# Patient Record
Sex: Male | Born: 1945
Health system: Southern US, Community
[De-identification: ages and names within clinical notes are randomized; demographics above are authoritative.]

## PROBLEM LIST (undated history)

## (undated) DIAGNOSIS — N189 Chronic kidney disease, unspecified: Secondary | ICD-10-CM

## (undated) DIAGNOSIS — Z8659 Personal history of other mental and behavioral disorders: Secondary | ICD-10-CM

## (undated) DIAGNOSIS — R809 Proteinuria, unspecified: Secondary | ICD-10-CM

## (undated) DIAGNOSIS — Z86010 Personal history of colonic polyps: Secondary | ICD-10-CM

## (undated) DIAGNOSIS — N529 Male erectile dysfunction, unspecified: Secondary | ICD-10-CM

## (undated) DIAGNOSIS — I639 Cerebral infarction, unspecified: Secondary | ICD-10-CM

## (undated) DIAGNOSIS — E118 Type 2 diabetes mellitus with unspecified complications: Secondary | ICD-10-CM

## (undated) DIAGNOSIS — E119 Type 2 diabetes mellitus without complications: Secondary | ICD-10-CM

## (undated) DIAGNOSIS — K922 Gastrointestinal hemorrhage, unspecified: Secondary | ICD-10-CM

## (undated) DIAGNOSIS — R42 Dizziness and giddiness: Secondary | ICD-10-CM

## (undated) DIAGNOSIS — I10 Essential (primary) hypertension: Secondary | ICD-10-CM

## (undated) DIAGNOSIS — E669 Obesity, unspecified: Secondary | ICD-10-CM

## (undated) DIAGNOSIS — G4733 Obstructive sleep apnea (adult) (pediatric): Secondary | ICD-10-CM

## (undated) DIAGNOSIS — M79661 Pain in right lower leg: Secondary | ICD-10-CM

## (undated) DIAGNOSIS — K5790 Diverticulosis of intestine, part unspecified, without perforation or abscess without bleeding: Secondary | ICD-10-CM

## (undated) DIAGNOSIS — M109 Gout, unspecified: Secondary | ICD-10-CM

## (undated) DIAGNOSIS — E7800 Pure hypercholesterolemia, unspecified: Secondary | ICD-10-CM

## (undated) DIAGNOSIS — M1990 Unspecified osteoarthritis, unspecified site: Secondary | ICD-10-CM

## (undated) DIAGNOSIS — Z794 Long term (current) use of insulin: Secondary | ICD-10-CM

## (undated) DIAGNOSIS — I739 Peripheral vascular disease, unspecified: Secondary | ICD-10-CM

## (undated) HISTORY — DX: Pain in right lower leg: M79.661

## (undated) HISTORY — DX: Diverticulosis of intestine, part unspecified, without perforation or abscess without bleeding: K57.90

## (undated) HISTORY — DX: Long term (current) use of insulin: Z79.4

## (undated) HISTORY — DX: Unspecified osteoarthritis, unspecified site: M19.90

## (undated) HISTORY — DX: Chronic kidney disease, unspecified: N18.9

## (undated) HISTORY — DX: Obstructive sleep apnea (adult) (pediatric): G47.33

## (undated) HISTORY — DX: Gout, unspecified: M10.9

## (undated) HISTORY — PX: UMBILICAL HERNIA REPAIR: SHX196

## (undated) HISTORY — DX: Morbid (severe) obesity due to excess calories: E66.01

## (undated) HISTORY — DX: Proteinuria, unspecified: R80.9

## (undated) HISTORY — DX: Gastrointestinal hemorrhage, unspecified: K92.2

## (undated) HISTORY — DX: Type 2 diabetes mellitus with unspecified complications: E11.8

## (undated) HISTORY — DX: Peripheral vascular disease, unspecified: I73.9

## (undated) HISTORY — DX: Dizziness and giddiness: R42

## (undated) HISTORY — DX: Obesity, unspecified: E66.9

## (undated) HISTORY — DX: Cerebral infarction, unspecified: I63.9

## (undated) HISTORY — DX: Male erectile dysfunction, unspecified: N52.9

## (undated) HISTORY — DX: Personal history of colonic polyps: Z86.010

## (undated) SURGERY — Surgical Case
Anesthesia: *Unknown

---

## 2004-07-17 ENCOUNTER — Ambulatory Visit (HOSPITAL_COMMUNITY): Admission: RE | Admit: 2004-07-17 | Discharge: 2004-07-17 | Payer: Self-pay | Admitting: *Deleted

## 2004-08-25 ENCOUNTER — Inpatient Hospital Stay (HOSPITAL_COMMUNITY): Admission: EM | Admit: 2004-08-25 | Discharge: 2004-08-27 | Payer: Self-pay | Admitting: Podiatry

## 2004-12-18 ENCOUNTER — Ambulatory Visit: Payer: Self-pay | Admitting: Internal Medicine

## 2004-12-21 ENCOUNTER — Ambulatory Visit: Payer: Self-pay | Admitting: Internal Medicine

## 2004-12-21 ENCOUNTER — Ambulatory Visit (HOSPITAL_COMMUNITY): Admission: RE | Admit: 2004-12-21 | Discharge: 2004-12-21 | Payer: Self-pay | Admitting: Internal Medicine

## 2005-01-04 ENCOUNTER — Ambulatory Visit (HOSPITAL_BASED_OUTPATIENT_CLINIC_OR_DEPARTMENT_OTHER): Admission: RE | Admit: 2005-01-04 | Discharge: 2005-01-04 | Payer: Self-pay | Admitting: General Surgery

## 2005-01-04 ENCOUNTER — Ambulatory Visit (HOSPITAL_COMMUNITY): Admission: RE | Admit: 2005-01-04 | Discharge: 2005-01-04 | Payer: Self-pay | Admitting: General Surgery

## 2005-01-04 ENCOUNTER — Encounter (INDEPENDENT_AMBULATORY_CARE_PROVIDER_SITE_OTHER): Payer: Self-pay | Admitting: *Deleted

## 2009-04-03 ENCOUNTER — Encounter: Payer: Self-pay | Admitting: Internal Medicine

## 2009-05-25 DIAGNOSIS — E7800 Pure hypercholesterolemia, unspecified: Secondary | ICD-10-CM | POA: Insufficient documentation

## 2009-05-25 DIAGNOSIS — N529 Male erectile dysfunction, unspecified: Secondary | ICD-10-CM | POA: Insufficient documentation

## 2009-05-25 DIAGNOSIS — E1121 Type 2 diabetes mellitus with diabetic nephropathy: Secondary | ICD-10-CM | POA: Insufficient documentation

## 2009-08-01 ENCOUNTER — Encounter: Payer: Self-pay | Admitting: Internal Medicine

## 2009-11-13 ENCOUNTER — Encounter (INDEPENDENT_AMBULATORY_CARE_PROVIDER_SITE_OTHER): Payer: Self-pay | Admitting: *Deleted

## 2009-11-17 ENCOUNTER — Encounter (INDEPENDENT_AMBULATORY_CARE_PROVIDER_SITE_OTHER): Payer: Self-pay | Admitting: *Deleted

## 2009-11-24 ENCOUNTER — Encounter: Payer: Self-pay | Admitting: Internal Medicine

## 2009-12-01 ENCOUNTER — Encounter: Payer: Self-pay | Admitting: Internal Medicine

## 2009-12-22 ENCOUNTER — Ambulatory Visit: Payer: Self-pay | Admitting: Internal Medicine

## 2009-12-22 DIAGNOSIS — K5730 Diverticulosis of large intestine without perforation or abscess without bleeding: Secondary | ICD-10-CM | POA: Insufficient documentation

## 2009-12-22 DIAGNOSIS — K5669 Other intestinal obstruction: Secondary | ICD-10-CM

## 2010-01-20 ENCOUNTER — Encounter: Admission: RE | Admit: 2010-01-20 | Discharge: 2010-01-20 | Payer: Self-pay | Admitting: Internal Medicine

## 2010-02-03 ENCOUNTER — Encounter (INDEPENDENT_AMBULATORY_CARE_PROVIDER_SITE_OTHER): Payer: Self-pay | Admitting: *Deleted

## 2010-02-04 ENCOUNTER — Encounter (INDEPENDENT_AMBULATORY_CARE_PROVIDER_SITE_OTHER): Payer: Self-pay | Admitting: *Deleted

## 2010-02-04 ENCOUNTER — Ambulatory Visit: Payer: Self-pay | Admitting: Internal Medicine

## 2010-02-16 ENCOUNTER — Ambulatory Visit: Payer: Self-pay | Admitting: Internal Medicine

## 2010-06-03 DIAGNOSIS — E1165 Type 2 diabetes mellitus with hyperglycemia: Secondary | ICD-10-CM | POA: Insufficient documentation

## 2010-12-03 NOTE — Letter (Signed)
Summary: [facility]  [address] [place], [place] [address]   Phone: [phone]  Fax: [phone]       Jacob Avery    1946/08/23    MRN: [id number]        Procedure Day /Date:  Monday  02/16/10     Arrival Time:  10:30am      Procedure Time:  11:30am     Location of Procedure:                    Juliann Pares _  [facility] (4th Floor)    PREPARATION FOR FLEXIBLE SIGMOIDOSCOPY WITH FLEET ENEMA  Prior to the day before your procedure, purchase  one Fleet Enema from the laxative section of your drugstore.  _________________________________________________________________________________________________  THE DAY BEFORE YOUR PROCEDURE             DATE: 04/17    DAY:  Sunday  1.   Have a clear liquid dinner the night before your procedure.  2.   Do not drink anything colored red or purple.  Avoid juices with pulp.  No orange juice.              CLEAR LIQUIDS INCLUDE: Water Jello Ice Popsicles Tea (sugar ok, no milk/cream) Powdered fruit flavored drinks Coffee (sugar ok, no milk/cream) Gatorade Juice: apple, white grape, white cranberry  Lemonade Clear bullion, consomm, broth Carbonated beverages (any kind) Strained chicken noodle soup Hard Candy    ___________________________________________________________________________________________________  THE DAY OF YOUR PROCEDURE            DATE: 04/18 DAY:  Monday  1.   Use Fleet Enema one hour prior to coming for procedure.  2.   You may drink clear liquids until  9:30am  (2 hours before exam)       MEDICATION INSTRUCTIONS  Unless otherwise instructed, you should take regular prescription medications with a small sip of water as early as possible the morning of your procedure.  Diabetic patients - see separate instructions.    Additional medication instructions: Hold Benica/HCT the morning of procedure.         OTHER INSTRUCTIONS  You will need a responsible  adult at least 66 years of age to accompany you and drive you home.   This person must remain in the waiting room during your procedure.  Wear loose fitting clothing that is easily removed.  Leave jewelry and other valuables at home.  However, you may wish to bring a book to read or an iPod/MP3 player to listen to music as you wait for your procedure to start.  Remove all body piercing jewelry and leave at home.  Total time from sign-in until discharge is approximately 2-3 hours.  You should go home directly after your procedure and rest.  You can resume normal activities the day after your procedure.  The day of your procedure you should not:   Drive   Make legal decisions   Operate machinery   Drink alcohol   Return to work  You will receive specific instructions about eating, activities and medications before you leave.   The above instructions have been reviewed and explained to me by  Wyona Almas RN  February 04, 2010 9:20 AM     I fully understand and can verbalize these instructions _____________________________ Date _________

## 2010-12-03 NOTE — Procedures (Signed)
Summary: Colonoscopy: Diverticulosis   Colonoscopy  Procedure date:  12/21/2004  Findings:      Results: Diverticulosis.       Location:  [facility].    Procedures Next Due Date:    Colonoscopy: 12/2009  Patient Name: Jacob Avery, Jacob Avery MRN:  Procedure Procedures: Incomplete Colonoscopy, unable to go beyond splenic flexure. CPT: M2924229.  Personnel: Endoscopist: Iva Boop, MD, [facility].  Referred By: Guerry Bruin, MD.  Exam Location: Exam performed in Outpatient Clinic. Outpatient  Patient Consent: Procedure, Alternatives, Risks and Benefits discussed, consent obtained, from patient. Consent was obtained by the RN.  Indications  Average Risk Screening Routine.  History  Current Medications: Patient is not currently taking Coumadin.  Pre-Exam Physical: Performed Dec 21, 2004. Cardio-pulmonary exam, Rectal exam, HEENT exam , Mental status exam WNL.  Exam Exam: Extent of exam reached: Sigmoid Colon, extent intended: Cecum.  The cecum was identified by appendiceal orifice and IC valve. Patient position: left side to back. Colon retroflexion performed. Images taken. ASA Classification: III. Tolerance: good.  Monitoring: Pulse and BP monitoring, Oximetry used. Supplemental O2 given.  Colon Prep Prep results: good.  Sedation Meds: Patient assessed and found to be appropriate for moderate (conscious) sedation. Fentanyl 75 mcg. given IV. Versed 7 mg. given IV.  Comments: Prone position also. Findings DIVERTICULOSIS: Sigmoid Colon. Not bleeding. ICD9: Diverticulosis, Colon: 562.10. Comments: severe, would not allow scope through.  NORMAL EXAM: Rectum.   Assessment Abnormal examination, see findings above.  Diagnoses: 562.10: Diverticulosis, Colon.   Comments: SEVERE SIGMOID DIVERTICULOSIS. UNABLE TO FIND LUMEN AND SAFELY ADVANCE SCOPE BEYOND. Events  Unplanned Interventions: No intervention was required.  Plans Patient Education: Patient  given standard instructions for: Diverticulosis. Yearly hemoccult testing recommended.  Disposition: After procedure patient sent to recovery. After recovery patient sent home.  Comments: BARIUM ENEMA TODAY  CC:   Richard [name]   The Patient    This report was created from the original endoscopy report, which was reviewed and signed by the above listed endoscopist.   [facility] Reports-CCC]

## 2010-12-03 NOTE — Procedures (Signed)
Summary: Flexible Sigmoidoscopy  Patient: [name] Note: All result statuses are Final unless otherwise noted.  Tests: (1) Flexible Sigmoidoscopy (FLX)  FLX Flexible Sigmoidoscopy                             DONE     [facility]     [address]     [place], [place]  [address]           FLEXIBLE SIGMOIDOSCOPY PROCEDURE REPORT           PATIENT:  Jacob Avery, Jacob Avery  MR#:  [id number]     BIRTHDATE:  1946/09/27, 63 yrs. old  GENDER:  male           ENDOSCOPIST:  Iva Boop, MD, [facility]           PROCEDURE DATE:  02/16/2010     PROCEDURE:  Flexible Sigmoidoscopy, diagnostic     ASA CLASS:  Class III     INDICATIONS:  abnormal imaging -  possible rectal polyp on CT     colonoscopy           MEDICATIONS:   none           DESCRIPTION OF PROCEDURE:   After the risks benefits and     alternatives of the procedure were thoroughly explained, informed     consent was obtained.  Digital rectal exam was performed and     revealed no rectal masses and an enlarged prostate.  Mildly     enlarged but smooth prostate. The [id number] endoscope     was introduced through the anus and advanced to the sigmoid colon,     without limitations.  The quality of the prep was adequate.  The     instrument was then slowly withdrawn as the mucosa was fully     examined.     <<PROCEDUREIMAGES>>           Severe diverticulosis was found in the sigmoid colon.  The rectum     and sigmoid appeared normal. in the rectum.   Retroflexed views in     the rectum revealed no abnormalities.    The scope was then     withdrawn from the patient and the procedure terminated.     COMPLICATIONS:  None           ENDOSCOPIC IMPRESSION:     1) Severe diverticulosis in the sigmoid colon     2) Normal rectum - no polyps seen           REPEAT EXAM:  In 5 year(s) for.  CT colonoscopy (office visit     recall)           Iva Boop, MD, [facility]           CC:  Guerry Bruin, MD     The Patient           n.     eSIGNED:   Iva Boop at 02/16/2010 12:44 PM           Donata Duff, [id number]  Note: An exclamation mark (!) indicates a result that was not dispersed into the flowsheet. Document Creation Date: 02/16/2010 12:45 PM _______________________________________________________________________  (1) Order result status: Final Collection or observation date-time: 02/16/2010 12:32 Requested date-time:  Receipt date-time:  Reported date-time:  Referring Physician:   Ordering Physician: Stan Head [phone]) Specimen Source:  Source: Launa Grill  Order Number: [id number] Lab site:

## 2010-12-03 NOTE — Letter (Signed)
Summary: New Patient letter  [facility]  [address] [place], [place] [address]   Phone: [phone]  Fax: [phone]       11/17/2009 MRN: [id number]  Jacob Avery [address] [place], [place]  [address]  Dear Mr. Deyarmin,  Welcome to the Gastroenterology Division at [facility].    You are scheduled to see Dr. Leone Payor on 12-22-09 at 9:00a.m. on the 3rd floor at [facility], [address]  We ask that you try to arrive at our office 15 minutes prior to your appointment time to allow for check-in.  We would like you to complete the enclosed self-administered evaluation form prior to your visit and bring it with you on the day of your appointment.  We will review it with you.  Also, please bring a complete list of all your medications or, if you prefer, bring the medication bottles and we will list them.  Please bring your insurance card so that we may make a copy of it.  If your insurance requires a referral to see a specialist, please bring your referral form from your primary care physician.  Co-payments are due at the time of your visit and may be paid by cash, check or credit card.     Your office visit will consist of a consult with your physician (includes a physical exam), any laboratory testing he/she may order, scheduling of any necessary diagnostic testing (e.g. x-ray, ultrasound, CT-scan), and scheduling of a procedure (e.g. Endoscopy, Colonoscopy) if required.  Please allow enough time on your schedule to allow for any/all of these possibilities.    If you cannot keep your appointment, please call [phone] to cancel or reschedule prior to your appointment date.  This allows [place] the opportunity to schedule an appointment for another patient in need of care.  If you do not cancel or reschedule by 5 p.m. the business day prior to your appointment date, you will be charged a $50.00 late cancellation/no-show fee.    Thank you for choosing  [facility] for your medical needs.  We appreciate the opportunity to care for you.  Please visit [place] at our website  to learn more about our practice.                     Sincerely,                                                             The Gastroenterology Division

## 2010-12-03 NOTE — Letter (Signed)
Summary: Office Visit Letter  [facility]  [address] [place], [place] [address]   Phone: [phone]  Fax: [phone]      November 13, 2009 MRN: [id number]   Jacob Avery [address] [place], [place]  [address]   Dear [name],   According to our records, it is time for you to schedule a follow-up office visit with [place].   At your convenience, please call [phone] (option #2)to schedule an office visit. If you have any questions, concerns, or feel that this letter is in error, we would appreciate your call.   Sincerely,  Iva Boop, M.D.  [facility]astro[facility] Division [phone]

## 2010-12-03 NOTE — Assessment & Plan Note (Signed)
Summary: FOLLOW UP--CH.   History of Present Illness Visit Type: Initial Visit Primary GI MD: Stan Head MD [facility] Primary Provider: Guerry Bruin, MD Chief Complaint: yearly follow-up denies any problems at this time History of Present Illness:   65 yo frican-american man with incomplete colonoscopy due to angulated and strictured sigmoid colon in setting of diverticulosis. Barium enema showed the severe diverticulosis but no polyps. He returns to discuss follow-up screening at 5 years from the original. He says hemocult of stool was and is negative (FIT used this year if not others). All other GI ROS negative.          Preventive Screening-Counseling & Management  Alcohol-Tobacco     Smoking Status: quit      Drug Use:  no.      Current Medications (verified): 1)  Aspirin 81 Mg Tbec (Aspirin) .Marland Kitchen.. 1 Tablet By Mouth Once Daily 2)  Lipitor 80 Mg Tabs (Atorvastatin Calcium) .Marland Kitchen.. 1 Tablet By Mouth Once Daily 3)  Benicar Hct 40-25 Mg Tabs (Olmesartan Medoxomil-Hctz) .Marland Kitchen.. 1 Tablet By Mouth Once Daily 4)  Actoplus Met 15-850 Mg Tabs (Pioglitazone Hcl-Metformin Hcl) .Marland Kitchen.. 1 Tablet By Mouth Once Daily 5)  Amlodipine Besylate 10 Mg Tabs (Amlodipine Besylate) .Marland Kitchen.. 1 Tablet By Mouth Once Daily 6)  Lantus For Opticlik 100 Unit/ml Soln (Insulin Glargine) .... 35 Units Per Day 7)  Levitra 10 Mg Tabs (Vardenafil Hcl) .... Take 1 Tablet By Mouth As Needed  Allergies (verified): No Known Drug Allergies  Past History:  Past Medical History: Reviewed history from 12/18/2009 and no changes required. Diabetes Diverticulosis Hyperlipidemia Hypertension ED Umbilical Hernia Peripheral Vascular Disease Obesity  Past Surgical History: Reviewed history from 12/18/2009 and no changes required. Hernia Surgery  Family History: Family History of Diabetes: Brother No FH of Colon Cancer:  Social History: Married, 2 girls retired Patient is a former smoker. quit 4 years ago Alcohol  Use - no Illicit Drug Use - no Daily Caffeine Use Smoking Status:  quit Drug Use:  no  Vital Signs:  Patient profile:   65 year old male Height:      69.25 inches Weight:      281 pounds BMI:     41.35 Pulse rate:   68 / minute Pulse rhythm:   regular BP sitting:   130 / 80  (left arm)  Vitals Entered By: Milford Cage NCMA (December 22, 2009 9:06 AM)  Physical Exam  General:  obese.  NAD Eyes:  anicteric Abdomen:  obese Psych:  Alert and cooperative. Normal mood and affect.   Impression & Recommendations:  Problem # 1:  SCREENING COLORECTAL-CANCER (ICD-V76.51) Assessment Comment Only  Prior colonoscopy incomplete (12/2004). I do not think total optical colonoscopy is possible in him  Orders: [facility] Colon ([facility])  Problem # 2:  DIVERTICULOSIS-COLON (ICD-562.10) Assessment: Unchanged Severe in sigmoid Orders: Virtual Colon ([facility])  Problem # 3:  OTHER SPECIFIED INTESTINAL OBSTRUCTION (ICD-560.89) Due to diverticulsosis- sigmoid stenosis, stricture Orders: Virtual Colon ([facility])  Patient Instructions: 1)  CT Colonoscopy handout given. 2)  We will look into coverage for your CT Colonoscopy and call you with that information.  If you have not heard from [place] in 1 week please call our office. 3)  We will obtain your Hemoccult card results and labs for the last year from Dr. Wylene Simmer. 4)  Copy sent to : Guerry Bruin, MD 5)  The medication list was reviewed and reconciled.  All changed / newly prescribed medications were explained.  A complete medication list was provided to the patient / caregiver.

## 2010-12-03 NOTE — Letter (Signed)
Summary: Diabetic Instructions  [facility]  [address] [place], [place] [address]   Phone: [phone]  Fax: [phone]    Jacob Avery [birth year] MRN: [id number]   _ x _   ORAL DIABETIC MEDICATION INSTRUCTIONS  The day before your procedure:   Take your diabetic pill as you do normally  The day of your procedure:   Do not take your diabetic pill    We will check your blood sugar levels during the admission process and again in Recovery before discharging you home  ________________________________________________________________________  _x  _   INSULIN (LONG ACTING) MEDICATION INSTRUCTIONS (Lantus, NPH, 70/30, Humulin, Novolin-N)   The day before your procedure:   Take  your regular evening dose    The day of your procedure:   Do not take your morning dose

## 2010-12-03 NOTE — Miscellaneous (Signed)
Summary: [facility] Previsit/prep  Clinical Lists Changes  Observations: Added new observation of NKA: T (02/04/2010 8:41)

## 2011-01-19 LAB — GLUCOSE, CAPILLARY
Glucose-Capillary: 123 mg/dL — ABNORMAL HIGH (ref 70–99)
Glucose-Capillary: 150 mg/dL — ABNORMAL HIGH (ref 70–99)

## 2011-03-19 NOTE — Discharge Summary (Signed)
NAME:  Jacob Avery, Jacob Avery NO.:  [id number]   MEDICAL RECORD NO.:  [id number]          PATIENT TYPE:  INP   LOCATION:  4743                         FACILITY:  [facility]   PHYSICIAN:  Gaspar Garbe, M.D.DATE OF BIRTH:  04/18/1946   DATE OF ADMISSION:  08/25/2004  DATE OF DISCHARGE:  08/27/2004                                 DISCHARGE SUMMARY   DIAGNOSES:  1.  New onset diabetes.  2.  Hypertension.  3.  Hyperlipidemia.  4.  Peripheral vascular disease.  5.  History of diverticulitis.   MEDICATIONS:  1.  Avandamet 4/1,000, 1 tablet q.a.m.  2.  Caduet 10/40 p.o. daily.  3.  Diovan/HCT 160/25 daily.  4.  Aspirin 81 mg daily.  5.  Lantus insulin 20 units subcutaneously q.a.m.   LABORATORIES:  A1c and C peptide pending at the time of this dictation.  Initial blood sugar was greater than 700.  Day of discharge glucose 102.  BUN and creatinine 14 and 0.9, respectively, status post hydration.  Other  electrolytes are within normal limits.  TSH 0.686.   SUMMARY OF HOSPITAL COURSE:  Jacob Avery was admitted to the hospital  by one of my partners after having symptoms of polyuria, polydipsia, and  ocular changes for approximately four weeks.  He had this checked by a  colleague at his work and was found to have a critically high blood sugar  and was sent to the emergency room and subsequently admitted with  hyperosmolar nonketosis.  He was aggressively given hydration and sliding  scale insulin.  Avandamet was started on August 26, 2004, and by the  morning of August 27, 2004, he had normalization of his blood sugars.  He  was given Lantus 10 units initially on August 26, 2004, and increased to 20  units on August 27, 2004.  He was also given a free One Touch Ultra meter  and test strips as well as diabetic education by the inpatient diabetes  teaching team.  The patient continued to do considerably better than he had  been before and was discharged in good  condition on the morning of August 27, 2004.   PHYSICAL EXAMINATION:  VITAL SIGNS:  Temperature 97.6.  PULSE:  60.  RESPIRATORY RATE:  20.  BLOOD PRESSURE:  110/69.  CBG:  153.  OXYGEN SATURATION:  97% on room air.  GENERAL:  In no acute distress.  HEENT:  Normocephalic, atraumatic.  PERRLA.  EOMI.  ENT is within normal  limits.  NECK:  Supple.  No lymphadenopathy, JVD, or bruits.  HEART:  Regular rate and rhythm.  No murmur, rub, or gallop.  LUNGS:  Clear to auscultation bilaterally.   CONDITION ON DISCHARGE:  Stable.   DISCHARGE INSTRUCTIONS:  The patient will follow up with Dr. Wylene Simmer on  September 01, 2004.  C peptide and A1c will be available at that time, and it  can be decided as to whether the patient will do well with pills or whether  he will need further management with insulin.  He is to check his blood  sugars  once in the morning and once prior to supper, and keep tabulation of  this, and bring it with him to the office.  If his blood sugars continue to  be greater than 250 or 300 over the course of the weekend, he is to call the  on-call M.D. for the purpose of changing his insulin dose.   ADDENDUM:  The patient's A1c was noted as 11.6, and a C peptide was low at  0.3, indicating he may require insulin.  We will see whether he has a  rebound in his pancreatic function following this.  Continue to follow him  in the office as needed.      Rich   RWT/MEDQ  D:  08/27/2004  T:  08/28/2004  Job:  [id number]

## 2011-03-19 NOTE — H&P (Signed)
NAME:  Jacob Avery, Jacob Avery NO.:  [id number]   MEDICAL RECORD NO.:  [id number]          PATIENT TYPE:  INP   LOCATION:  1826                         FACILITY:  [facility]   PHYSICIAN:  Mark A. Perini, M.D.   DATE OF BIRTH:  1945/11/20   DATE OF ADMISSION:  08/25/2004  DATE OF DISCHARGE:                                HISTORY & PHYSICAL   CHIEF COMPLAINT:  CBG high at work.   HISTORY OF PRESENT ILLNESS:  Jacob Avery is a 65 year old male with a past  medical history as listed below. He has had several symptoms of new onset  diabetes in the last few weeks including blurred vision, decreased appetite,  fatigue, polyuria, and polydipsia. He had a CBG checked at work by a  Animator. It read high on two occasions and he was sent to the emergency  room. In the emergency room his diagnosis of diabetes was confirmed with a  blood sugar of 728. He will require admission for further evaluation and  treatment.   PAST MEDICAL HISTORY:  1.  Atherosclerotic peripheral vascular disease with bilateral lower      extremity claudication and a recent arteriogram done in September 2005      showing mild to moderate iliac disease bilaterally, diffuse right      superficial femoral artery occlusive disease, and left superficial      femoral artery occlusive disease.  2.  Hypertension.  3.  Hyperlipidemia.  4.  No history of MI or coronary artery disease to our knowledge. He did      have a recent stress test approximately two months ago per the patient      history, which was normal. No history of stroke.  5.  He has an umbilical  hernia which is stable.  6.  History of clinical diverticulitis, but no definite history of      colonoscopy or GI physician. He was hospitalized in the early 90s for      diverticulitis one time.   No other surgery is noted.   ALLERGIES:  No known allergies.   MEDICATIONS:  1.  Diovan 160 mg daily.  2.  Aspirin 81 mg daily.  3.  Caduet unknown dose.  4.   Antibiotic, amoxicillin for the last five days prescribed by a dentist      for possible gum disease.   SOCIAL HISTORY:  He is married. His wife's name is Clinical cytogeneticist. They have been  married since 1967. He has two daughters who are at his bedside, [place]  and [place]. He has 5 grandchildren. He quit tobacco four months ago and  previously had a 40 pack year smoking history. He works at [employer] in  the [employer]. He has a second shift job.  No alcohol or drug use  history.   FAMILY HISTORY:  Father died at age 24 of an MI and did have diabetes.  Mother died at age 5 of diabetes, Alzheimer disease, and stroke. Five  brothers. One brother died at age 61 of diabetes complications and end-stage  renal disease. He is the  oldest living sibling now. Other children have  hypertension, gout, and possible diabetes, and one may have early onset  coronary artery disease.   REVIEW OF SYSTEMS:  He has had some loose teeth and some bleeding around his  gums lately. Otherwise, he had been feeling well until a couple of weeks  ago. He denies pain. The people at work have been telling him that he looks  like he lost weight and looks like his eyes are drooping. He has had blurred  distant vision lately. He has had polyuria for the last two weeks and  significant polydipsia. He has been craving sweets lately and sodas. He  denies any nausea, vomiting, diarrhea, constipation, or fevers. He denies  any chest pain. He has had some fatigue with possible dyspnea on exertion in  the last  week or two. His appetite has been poor. He denies any blood from  above or below.   PHYSICAL EXAMINATION:  VITAL SIGNS: Temperature 97.2, blood pressure 119/49,  pulse 61, respirations 20, 97% saturation on room air.  GENERAL: In no acute distress. He is alert and oriented and neurologically  intact.  HEENT: Oropharynx is clear. There is no icterus, no JVD.  LUNGS: Clear to auscultation bilaterally with no rales,  rhonchi, or wheezes.  HEART: Regular rate and rhythm with no murmurs, rubs, or gallops.  ABDOMEN: Soft and nontender with a reducible inguinal hernia noted.  EXTREMITIES: There is no clubbing, cyanosis, or edema. The patient moves  extremities times four.   LABORATORY DATA:  Sodium 133, potassium 5.2, chloride 98, CO2 20, BUN 41,  creatinine 1.9. This compares to a baseline BUN of 17 and  a baseline  creatinine of 0.9 from five weeks ago.  Blood sugar is 728. Calcium 9.2.  His pH is 7.38, PCO2 42, bicarbonate 25. Hemoglobin 16.3, hematocrit 48.  Coags were normal on July 13, 2004.   ASSESSMENT/PLAN:  A 65 year old male with new onset type 2 diabetes with  associated hyperosmolar state. Will admit to a telemetry bed. Will hydrate.  Will check an A1C and other electrolytes as well as a thyroid test.  We  will obtain diabetes education. Will control sugars with insulin initially,  but he may be able to be discharged home eventually on oral treatment. Will  continue home medications to the best of our ability and place on DVT and  ulcer prophylaxis as well.       MAP/MEDQ  D:  08/25/2004  T:  08/26/2004  Job:  [id number]   cc:   Gaspar Garbe, M.D.  [address]  [place]  [place] [address]  Fax: [phone]   Larina Earthly, M.D.  [address]  [place]  [place] [address]   Anselm Pancoast. Zachery Dakins, M.D.  [address], [address]  [place]  [place] [address]  Fax: [phone]

## 2011-03-19 NOTE — Op Note (Signed)
NAME:  Jacob Avery                ACCOUNT NO.:  [id number]   MEDICAL RECORD NO.:  [id number]          PATIENT TYPE:  AMB   LOCATION:  DSC                          FACILITY:  [facility]   PHYSICIAN:  Jacob Avery, M.D.DATE OF BIRTH:  04/08/1946   DATE OF PROCEDURE:  01/04/2005  DATE OF DISCHARGE:                                 OPERATIVE REPORT   PREOPERATIVE DIAGNOSIS:  Large umbilical hernia.   POSTOPERATIVE DIAGNOSIS:  Large umbilical hernia.   OPERATION PERFORMED:  Repair of umbilical hernia.   SURGEON:  Jacob Pancoast. Jacob Avery, M.D.   ANESTHESIA:  General.   ASSISTANT:  Nurse.   INDICATIONS FOR PROCEDURE:  Jacob Avery is a 65 year old male who I saw  probably over a year ago when he had kind of a mild painful umbilical hernia  but when I examined him he had marked hypertension and I referred him to a  medical physician, Jacob Avery, M.D., who started managing his  hypertension  and then several months later the patient developed diabetes.  This has also been managed.  His hernia has become larger and he came back  to see in February.  As far as on examination, the hernia is not reducible.  It is kind of a lemon-orange size mass at the umbilicus and I recommended  that we repair it with general anesthesia.  We discussed the possibility of  placing mesh and he says he has stopped smoking during this year.  His  weight is about 235 pounds and preoperatively this morning, his sugar was  91.   DESCRIPTION OF PROCEDURE:  He was given a gram of Kefzol, taken back to the  operative suite, induction of general anesthesia, LOA tube, and then the  abdomen was prepped with Betadine with Betadine surgical scrub and solution  and draped in a sterile manner.  A transverse incision below the umbilicus  was made over the hernia sac and then the skin separated from the  subcutaneous tissue.  After I had worked circumferentially, this was a large  hernia sac.  I opened into the  peritoneum hernia sac but I was not confident  whether I was definitely in the hernia sac which I thought I was or whether  this was possibly a loop of bowel since it was a lot of kind of what really  was fat necrosis within the peritoneum right outside of this.  There was  some fluid up in the area but it did not smell. I did go ahead and culture  it aerobically. I then worked down at the fascia ring and making sure that  the patient was paralyzed.  He was kind of breathing on his own prior to  this and then made a little second opening into the actual peritoneum and  then with my finger I could see that this was incarcerated omentum and a  very thickened hernia sac with some fat necrosis up in the area.  I freed it  up circumferentially and then the omentum that was incarcerated to this area  was separated.  The pedicles were tied  with 2-0 Vicryl and then the hernia  sac I could go ahead and completely remove it circumferentially.  The fascia  defect is really only about the size of a quarter or maybe a 50 cent piece  and I think it would be best just to close it transversely with a #1 Novofil  sutures and not try to place any mesh on the inside.  I think that the fluid  that was up in it is sterile but I am not going to put a piece of mesh on  the outside of the fascia either.  The sutures were placed interrupted  transversely with #1 Novofil and after all five sutures had been placed,  they were tied under direct vision making sure that the bowel is not caught  up in the sutures.  I then did place three 0 Prolenes kind of between these  for reinforcing the fascia.  Next, the fascia was anesthetized with about 10  mL of 0.5% Marcaine with Adrenalin and then the subcutaneous tissue was  closed with 4-0 Vicryl and the skin was closed with mattress sutures of 4-0  nylon.  A little gauze was folded up and placed in the umbilicus and then  sterile occlusive dressing applied.  The patient will  be released after a  short stay in the recovery room. I think he can take his oral diabetic  medications today but as far as on the Lantus, he usually takes 20.  We will  see what the sugars run and in the recovery room, I may give him 10 of the  Lantus if his sugar is 175 or 150 or so.  The patient will be on a clear  liquid kind of diabetic diet today and hopefully his regular medications  tomorrow.      WJW/MEDQ  D:  01/04/2005  T:  01/04/2005  Job:  [id number]   cc:   Jacob Avery, M.D.  [address]  [place]  [place] [address]  Fax: [phone]

## 2011-03-19 NOTE — Op Note (Signed)
NAME:  Jacob Avery, Jacob Avery                            ACCOUNT NO.:  [id number]   MEDICAL RECORD NO.:  [id number]                   PATIENT TYPE:  OIB   LOCATION:  2899                                 FACILITY:  [facility]   PHYSICIAN:  Balinda Quails, M.D.                 DATE OF BIRTH:  1946-02-17   DATE OF PROCEDURE:  07/17/2004  DATE OF DISCHARGE:  07/17/2004                                 OPERATIVE REPORT   DIAGNOSIS:  Bilateral lower extremity claudication.   PROCEDURE:  Abdominal aortogram with bilateral lower extremity runoff  arteriography.   ACCESS:  Right common femoral artery 5 French sheath.   CONTRAST:  170 mL Visipaque.   COMPLICATIONS:  None apparent.   CLINICAL NOTE:  Jacob Avery is a 65 year old male with a history of  bilateral lower extremity claudication.  He has used tobacco in the past.  He is brought to the cath lab at this time for diagnostic arteriography.   PROCEDURE NOTE:  Patient brought to the cath lab in stable condition.  Placed in the supine position.  Both groins prepped and draped in a sterile  fashion.   Skin and subcutaneous tissue in right groin instilled with 1% Xylocaine.  A  needle was used and introduced to the right common femoral artery.  A .035  Magic Torque guidewire advanced through the needle into the mid abdominal  aorta.  The dilator removed and sheath flushed with heparin and saline  solution.   The patient administered 25 mcg of fentanyl and 1 mg of Versed  intravenously.   A mid abdominal AP aortogram was obtained.  This revealed single, widely  patent, bilateral renal arteries.  Brisk flow was noted through the superior  mesenteric artery.  The infrarenal aorta revealed some ectasia.  The common  iliac arteries revealed irregular atherosclerotic plaque without dominant  stenosis.   The pigtail catheter brought down to the aortic bifurcation.  Bilateral  oblique pelvic arteriography was obtained.  This also revealed diffuse  iliac  plaque.  Multiple areas of moderate stenosis were present without dominant  stenosis evident.   Lower extremity runoff arteriography obtained.  The right leg revealed  patent common femoral artery which revealed posterior plaque.  The right  profunda femoris artery was normal in caliber.  There was irregular plaque  throughout the length of the right superficial femoral artery with a high  grade stenosis at the adductor canal estimated to be greater than 80%.  The  right popliteal artery was patent and runoff in the right lower extremity  revealed a dominant peroneal artery.  __________ takeoff of the posterior  tibial artery and the anterior tibial artery was occluded.   The left lower extremity revealed the common femoral artery to be patent.  The left profunda femoris artery was normal.  The left superficial femoral  artery was occluded just beyond its origin.  There  was reconstitution of the  superficial femoral artery at the level of the adductor canal with patent  left popliteal artery.  Dominant left lower extremity runoff __________ the  peroneal and posterior tibial arteries with occlusion of the anterior tibial  artery.   A retrograde injection was then made through the right femoral sheath and  this further delineated the right iliac system.  The right external iliac  artery revealed diffuse irregular plaque without dominant stenosis.  There  was an area of moderate stenosis in the right common iliac artery in the  midportion.   This completed the arteriogram procedure.  The diffuse nature of the iliac  disease was not felt to be treated ideally with percutaneous intervention.   The right femoral sheath was removed.   FINAL IMPRESSION:  1.  Single widely patent bilateral inguinal arteries.  2.  Mild infrarenal aortic ectasia.  3.  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  4.  Diffuse right superficial femoral artery occlusive disease.  5.   Left superficial femoral artery occlusion.   DISPOSITION:  These results have been reviewed with the patient and family.  Followup arrangements will be made to see Dr. Arbie Cookey in the office in  approximately a month.      PGH/MEDQ  D:  07/17/2004  T:  07/19/2004  Job:  [id number]   cc:   [facility]

## 2011-06-14 DIAGNOSIS — M109 Gout, unspecified: Secondary | ICD-10-CM | POA: Insufficient documentation

## 2011-11-02 DIAGNOSIS — R809 Proteinuria, unspecified: Secondary | ICD-10-CM

## 2011-11-02 HISTORY — DX: Proteinuria, unspecified: R80.9

## 2011-12-20 DIAGNOSIS — E785 Hyperlipidemia, unspecified: Secondary | ICD-10-CM | POA: Diagnosis not present

## 2011-12-20 DIAGNOSIS — M109 Gout, unspecified: Secondary | ICD-10-CM | POA: Diagnosis not present

## 2011-12-20 DIAGNOSIS — Z125 Encounter for screening for malignant neoplasm of prostate: Secondary | ICD-10-CM | POA: Diagnosis not present

## 2011-12-27 DIAGNOSIS — Z0000 Encounter for general adult medical examination without abnormal findings: Secondary | ICD-10-CM | POA: Diagnosis not present

## 2011-12-27 DIAGNOSIS — M109 Gout, unspecified: Secondary | ICD-10-CM | POA: Diagnosis not present

## 2011-12-27 DIAGNOSIS — Z23 Encounter for immunization: Secondary | ICD-10-CM | POA: Diagnosis not present

## 2011-12-27 DIAGNOSIS — Z125 Encounter for screening for malignant neoplasm of prostate: Secondary | ICD-10-CM | POA: Diagnosis not present

## 2011-12-27 DIAGNOSIS — E785 Hyperlipidemia, unspecified: Secondary | ICD-10-CM | POA: Diagnosis not present

## 2011-12-28 DIAGNOSIS — Z1212 Encounter for screening for malignant neoplasm of rectum: Secondary | ICD-10-CM | POA: Diagnosis not present

## 2012-01-19 DIAGNOSIS — I10 Essential (primary) hypertension: Secondary | ICD-10-CM | POA: Diagnosis not present

## 2012-01-19 DIAGNOSIS — E669 Obesity, unspecified: Secondary | ICD-10-CM | POA: Diagnosis not present

## 2012-02-23 DIAGNOSIS — I10 Essential (primary) hypertension: Secondary | ICD-10-CM | POA: Diagnosis not present

## 2012-02-23 DIAGNOSIS — N529 Male erectile dysfunction, unspecified: Secondary | ICD-10-CM | POA: Diagnosis not present

## 2012-04-03 DIAGNOSIS — R809 Proteinuria, unspecified: Secondary | ICD-10-CM | POA: Diagnosis not present

## 2012-04-03 DIAGNOSIS — E1129 Type 2 diabetes mellitus with other diabetic kidney complication: Secondary | ICD-10-CM | POA: Diagnosis not present

## 2012-04-03 DIAGNOSIS — M109 Gout, unspecified: Secondary | ICD-10-CM | POA: Diagnosis not present

## 2012-04-03 DIAGNOSIS — I10 Essential (primary) hypertension: Secondary | ICD-10-CM | POA: Diagnosis not present

## 2012-05-17 DIAGNOSIS — I10 Essential (primary) hypertension: Secondary | ICD-10-CM | POA: Diagnosis not present

## 2012-05-17 DIAGNOSIS — R809 Proteinuria, unspecified: Secondary | ICD-10-CM | POA: Diagnosis not present

## 2012-05-17 DIAGNOSIS — E1165 Type 2 diabetes mellitus with hyperglycemia: Secondary | ICD-10-CM | POA: Diagnosis not present

## 2012-05-17 DIAGNOSIS — E1129 Type 2 diabetes mellitus with other diabetic kidney complication: Secondary | ICD-10-CM | POA: Diagnosis not present

## 2012-07-12 DIAGNOSIS — E1129 Type 2 diabetes mellitus with other diabetic kidney complication: Secondary | ICD-10-CM | POA: Diagnosis not present

## 2012-07-12 DIAGNOSIS — E785 Hyperlipidemia, unspecified: Secondary | ICD-10-CM | POA: Diagnosis not present

## 2012-07-12 DIAGNOSIS — Z23 Encounter for immunization: Secondary | ICD-10-CM | POA: Diagnosis not present

## 2012-07-12 DIAGNOSIS — I10 Essential (primary) hypertension: Secondary | ICD-10-CM | POA: Diagnosis not present

## 2012-07-12 DIAGNOSIS — E669 Obesity, unspecified: Secondary | ICD-10-CM | POA: Diagnosis not present

## 2012-08-17 DIAGNOSIS — H524 Presbyopia: Secondary | ICD-10-CM | POA: Diagnosis not present

## 2012-08-17 DIAGNOSIS — H5200 Hypermetropia, unspecified eye: Secondary | ICD-10-CM | POA: Diagnosis not present

## 2012-08-17 DIAGNOSIS — E1139 Type 2 diabetes mellitus with other diabetic ophthalmic complication: Secondary | ICD-10-CM | POA: Diagnosis not present

## 2012-08-17 DIAGNOSIS — H52229 Regular astigmatism, unspecified eye: Secondary | ICD-10-CM | POA: Diagnosis not present

## 2012-08-23 DIAGNOSIS — E1129 Type 2 diabetes mellitus with other diabetic kidney complication: Secondary | ICD-10-CM | POA: Diagnosis not present

## 2012-08-23 DIAGNOSIS — I10 Essential (primary) hypertension: Secondary | ICD-10-CM | POA: Diagnosis not present

## 2012-08-23 DIAGNOSIS — R809 Proteinuria, unspecified: Secondary | ICD-10-CM | POA: Diagnosis not present

## 2012-08-23 DIAGNOSIS — E669 Obesity, unspecified: Secondary | ICD-10-CM | POA: Diagnosis not present

## 2012-10-17 DIAGNOSIS — R809 Proteinuria, unspecified: Secondary | ICD-10-CM | POA: Diagnosis not present

## 2012-10-17 DIAGNOSIS — Z1331 Encounter for screening for depression: Secondary | ICD-10-CM | POA: Diagnosis not present

## 2012-10-17 DIAGNOSIS — E1165 Type 2 diabetes mellitus with hyperglycemia: Secondary | ICD-10-CM | POA: Diagnosis not present

## 2012-10-17 DIAGNOSIS — E785 Hyperlipidemia, unspecified: Secondary | ICD-10-CM | POA: Diagnosis not present

## 2012-10-17 DIAGNOSIS — I10 Essential (primary) hypertension: Secondary | ICD-10-CM | POA: Diagnosis not present

## 2012-10-17 DIAGNOSIS — E1129 Type 2 diabetes mellitus with other diabetic kidney complication: Secondary | ICD-10-CM | POA: Diagnosis not present

## 2012-12-28 DIAGNOSIS — E1129 Type 2 diabetes mellitus with other diabetic kidney complication: Secondary | ICD-10-CM | POA: Diagnosis not present

## 2012-12-28 DIAGNOSIS — R809 Proteinuria, unspecified: Secondary | ICD-10-CM | POA: Diagnosis not present

## 2012-12-28 DIAGNOSIS — E669 Obesity, unspecified: Secondary | ICD-10-CM | POA: Diagnosis not present

## 2012-12-28 DIAGNOSIS — E1165 Type 2 diabetes mellitus with hyperglycemia: Secondary | ICD-10-CM | POA: Diagnosis not present

## 2012-12-28 DIAGNOSIS — I10 Essential (primary) hypertension: Secondary | ICD-10-CM | POA: Diagnosis not present

## 2013-01-17 DIAGNOSIS — I10 Essential (primary) hypertension: Secondary | ICD-10-CM | POA: Diagnosis not present

## 2013-01-17 DIAGNOSIS — M109 Gout, unspecified: Secondary | ICD-10-CM | POA: Diagnosis not present

## 2013-01-17 DIAGNOSIS — N529 Male erectile dysfunction, unspecified: Secondary | ICD-10-CM | POA: Diagnosis not present

## 2013-01-17 DIAGNOSIS — E785 Hyperlipidemia, unspecified: Secondary | ICD-10-CM | POA: Diagnosis not present

## 2013-01-17 DIAGNOSIS — R809 Proteinuria, unspecified: Secondary | ICD-10-CM | POA: Diagnosis not present

## 2013-01-17 DIAGNOSIS — E1129 Type 2 diabetes mellitus with other diabetic kidney complication: Secondary | ICD-10-CM | POA: Diagnosis not present

## 2013-01-17 DIAGNOSIS — E669 Obesity, unspecified: Secondary | ICD-10-CM | POA: Diagnosis not present

## 2013-02-28 DIAGNOSIS — I10 Essential (primary) hypertension: Secondary | ICD-10-CM | POA: Diagnosis not present

## 2013-02-28 DIAGNOSIS — R809 Proteinuria, unspecified: Secondary | ICD-10-CM | POA: Diagnosis not present

## 2013-02-28 DIAGNOSIS — E1165 Type 2 diabetes mellitus with hyperglycemia: Secondary | ICD-10-CM | POA: Diagnosis not present

## 2013-02-28 DIAGNOSIS — E1129 Type 2 diabetes mellitus with other diabetic kidney complication: Secondary | ICD-10-CM | POA: Diagnosis not present

## 2013-04-25 DIAGNOSIS — E785 Hyperlipidemia, unspecified: Secondary | ICD-10-CM | POA: Diagnosis not present

## 2013-04-25 DIAGNOSIS — Z6841 Body Mass Index (BMI) 40.0 and over, adult: Secondary | ICD-10-CM | POA: Diagnosis not present

## 2013-04-25 DIAGNOSIS — M109 Gout, unspecified: Secondary | ICD-10-CM | POA: Diagnosis not present

## 2013-04-25 DIAGNOSIS — E1129 Type 2 diabetes mellitus with other diabetic kidney complication: Secondary | ICD-10-CM | POA: Diagnosis not present

## 2013-04-25 DIAGNOSIS — I10 Essential (primary) hypertension: Secondary | ICD-10-CM | POA: Diagnosis not present

## 2013-04-25 DIAGNOSIS — E669 Obesity, unspecified: Secondary | ICD-10-CM | POA: Diagnosis not present

## 2013-04-25 DIAGNOSIS — R809 Proteinuria, unspecified: Secondary | ICD-10-CM | POA: Diagnosis not present

## 2013-04-25 DIAGNOSIS — N529 Male erectile dysfunction, unspecified: Secondary | ICD-10-CM | POA: Diagnosis not present

## 2013-06-06 DIAGNOSIS — E1129 Type 2 diabetes mellitus with other diabetic kidney complication: Secondary | ICD-10-CM | POA: Diagnosis not present

## 2013-06-06 DIAGNOSIS — R809 Proteinuria, unspecified: Secondary | ICD-10-CM | POA: Diagnosis not present

## 2013-06-06 DIAGNOSIS — I10 Essential (primary) hypertension: Secondary | ICD-10-CM | POA: Diagnosis not present

## 2013-06-06 DIAGNOSIS — E1165 Type 2 diabetes mellitus with hyperglycemia: Secondary | ICD-10-CM | POA: Diagnosis not present

## 2013-08-01 DIAGNOSIS — M109 Gout, unspecified: Secondary | ICD-10-CM | POA: Diagnosis not present

## 2013-08-01 DIAGNOSIS — E669 Obesity, unspecified: Secondary | ICD-10-CM | POA: Diagnosis not present

## 2013-08-01 DIAGNOSIS — E1129 Type 2 diabetes mellitus with other diabetic kidney complication: Secondary | ICD-10-CM | POA: Diagnosis not present

## 2013-08-01 DIAGNOSIS — E785 Hyperlipidemia, unspecified: Secondary | ICD-10-CM | POA: Diagnosis not present

## 2013-08-01 DIAGNOSIS — Z6841 Body Mass Index (BMI) 40.0 and over, adult: Secondary | ICD-10-CM | POA: Diagnosis not present

## 2013-08-01 DIAGNOSIS — Z23 Encounter for immunization: Secondary | ICD-10-CM | POA: Diagnosis not present

## 2013-08-01 DIAGNOSIS — R809 Proteinuria, unspecified: Secondary | ICD-10-CM | POA: Diagnosis not present

## 2013-08-01 DIAGNOSIS — I10 Essential (primary) hypertension: Secondary | ICD-10-CM | POA: Diagnosis not present

## 2013-09-12 DIAGNOSIS — R809 Proteinuria, unspecified: Secondary | ICD-10-CM | POA: Diagnosis not present

## 2013-09-12 DIAGNOSIS — I10 Essential (primary) hypertension: Secondary | ICD-10-CM | POA: Diagnosis not present

## 2013-09-12 DIAGNOSIS — E1129 Type 2 diabetes mellitus with other diabetic kidney complication: Secondary | ICD-10-CM | POA: Diagnosis not present

## 2013-10-31 DIAGNOSIS — E785 Hyperlipidemia, unspecified: Secondary | ICD-10-CM | POA: Diagnosis not present

## 2013-10-31 DIAGNOSIS — Z125 Encounter for screening for malignant neoplasm of prostate: Secondary | ICD-10-CM | POA: Diagnosis not present

## 2013-10-31 DIAGNOSIS — M109 Gout, unspecified: Secondary | ICD-10-CM | POA: Diagnosis not present

## 2013-10-31 DIAGNOSIS — E1129 Type 2 diabetes mellitus with other diabetic kidney complication: Secondary | ICD-10-CM | POA: Diagnosis not present

## 2013-10-31 DIAGNOSIS — I10 Essential (primary) hypertension: Secondary | ICD-10-CM | POA: Diagnosis not present

## 2013-11-07 DIAGNOSIS — Z125 Encounter for screening for malignant neoplasm of prostate: Secondary | ICD-10-CM | POA: Diagnosis not present

## 2013-11-07 DIAGNOSIS — E785 Hyperlipidemia, unspecified: Secondary | ICD-10-CM | POA: Diagnosis not present

## 2013-11-07 DIAGNOSIS — I10 Essential (primary) hypertension: Secondary | ICD-10-CM | POA: Diagnosis not present

## 2013-11-07 DIAGNOSIS — Z1331 Encounter for screening for depression: Secondary | ICD-10-CM | POA: Diagnosis not present

## 2013-11-07 DIAGNOSIS — Z6841 Body Mass Index (BMI) 40.0 and over, adult: Secondary | ICD-10-CM | POA: Diagnosis not present

## 2013-11-07 DIAGNOSIS — Z0000 Encounter for general adult medical examination without abnormal findings: Secondary | ICD-10-CM | POA: Diagnosis not present

## 2013-11-07 DIAGNOSIS — E1129 Type 2 diabetes mellitus with other diabetic kidney complication: Secondary | ICD-10-CM | POA: Diagnosis not present

## 2013-11-07 DIAGNOSIS — E669 Obesity, unspecified: Secondary | ICD-10-CM | POA: Diagnosis not present

## 2013-11-07 DIAGNOSIS — R809 Proteinuria, unspecified: Secondary | ICD-10-CM | POA: Diagnosis not present

## 2013-11-07 DIAGNOSIS — M109 Gout, unspecified: Secondary | ICD-10-CM | POA: Diagnosis not present

## 2013-12-19 DIAGNOSIS — E1129 Type 2 diabetes mellitus with other diabetic kidney complication: Secondary | ICD-10-CM | POA: Diagnosis not present

## 2013-12-19 DIAGNOSIS — Z6841 Body Mass Index (BMI) 40.0 and over, adult: Secondary | ICD-10-CM | POA: Diagnosis not present

## 2013-12-19 DIAGNOSIS — I10 Essential (primary) hypertension: Secondary | ICD-10-CM | POA: Diagnosis not present

## 2013-12-19 DIAGNOSIS — R809 Proteinuria, unspecified: Secondary | ICD-10-CM | POA: Diagnosis not present

## 2014-02-20 DIAGNOSIS — R809 Proteinuria, unspecified: Secondary | ICD-10-CM | POA: Diagnosis not present

## 2014-02-20 DIAGNOSIS — E785 Hyperlipidemia, unspecified: Secondary | ICD-10-CM | POA: Diagnosis not present

## 2014-02-20 DIAGNOSIS — E1129 Type 2 diabetes mellitus with other diabetic kidney complication: Secondary | ICD-10-CM | POA: Diagnosis not present

## 2014-02-20 DIAGNOSIS — E669 Obesity, unspecified: Secondary | ICD-10-CM | POA: Diagnosis not present

## 2014-02-20 DIAGNOSIS — M109 Gout, unspecified: Secondary | ICD-10-CM | POA: Diagnosis not present

## 2014-02-20 DIAGNOSIS — F528 Other sexual dysfunction not due to a substance or known physiological condition: Secondary | ICD-10-CM | POA: Diagnosis not present

## 2014-02-20 DIAGNOSIS — I10 Essential (primary) hypertension: Secondary | ICD-10-CM | POA: Diagnosis not present

## 2014-04-03 DIAGNOSIS — E1165 Type 2 diabetes mellitus with hyperglycemia: Secondary | ICD-10-CM | POA: Diagnosis not present

## 2014-04-03 DIAGNOSIS — E1129 Type 2 diabetes mellitus with other diabetic kidney complication: Secondary | ICD-10-CM | POA: Diagnosis not present

## 2014-04-03 DIAGNOSIS — I10 Essential (primary) hypertension: Secondary | ICD-10-CM | POA: Diagnosis not present

## 2014-04-03 DIAGNOSIS — R809 Proteinuria, unspecified: Secondary | ICD-10-CM | POA: Diagnosis not present

## 2014-04-03 DIAGNOSIS — Z6841 Body Mass Index (BMI) 40.0 and over, adult: Secondary | ICD-10-CM | POA: Diagnosis not present

## 2014-05-22 DIAGNOSIS — E669 Obesity, unspecified: Secondary | ICD-10-CM | POA: Diagnosis not present

## 2014-05-22 DIAGNOSIS — Z23 Encounter for immunization: Secondary | ICD-10-CM | POA: Diagnosis not present

## 2014-05-22 DIAGNOSIS — E1129 Type 2 diabetes mellitus with other diabetic kidney complication: Secondary | ICD-10-CM | POA: Diagnosis not present

## 2014-05-22 DIAGNOSIS — M109 Gout, unspecified: Secondary | ICD-10-CM | POA: Diagnosis not present

## 2014-05-22 DIAGNOSIS — R809 Proteinuria, unspecified: Secondary | ICD-10-CM | POA: Diagnosis not present

## 2014-05-22 DIAGNOSIS — F528 Other sexual dysfunction not due to a substance or known physiological condition: Secondary | ICD-10-CM | POA: Diagnosis not present

## 2014-05-22 DIAGNOSIS — I10 Essential (primary) hypertension: Secondary | ICD-10-CM | POA: Diagnosis not present

## 2014-05-22 DIAGNOSIS — E785 Hyperlipidemia, unspecified: Secondary | ICD-10-CM | POA: Diagnosis not present

## 2014-07-03 DIAGNOSIS — E1165 Type 2 diabetes mellitus with hyperglycemia: Secondary | ICD-10-CM | POA: Diagnosis not present

## 2014-07-03 DIAGNOSIS — E1129 Type 2 diabetes mellitus with other diabetic kidney complication: Secondary | ICD-10-CM | POA: Diagnosis not present

## 2014-07-03 DIAGNOSIS — Z6841 Body Mass Index (BMI) 40.0 and over, adult: Secondary | ICD-10-CM | POA: Diagnosis not present

## 2014-07-03 DIAGNOSIS — R809 Proteinuria, unspecified: Secondary | ICD-10-CM | POA: Diagnosis not present

## 2014-07-03 DIAGNOSIS — I10 Essential (primary) hypertension: Secondary | ICD-10-CM | POA: Diagnosis not present

## 2014-08-02 ENCOUNTER — Encounter: Payer: Self-pay | Admitting: Internal Medicine

## 2014-08-23 DIAGNOSIS — F5221 Male erectile disorder: Secondary | ICD-10-CM | POA: Diagnosis not present

## 2014-08-23 DIAGNOSIS — Z1389 Encounter for screening for other disorder: Secondary | ICD-10-CM | POA: Diagnosis not present

## 2014-08-23 DIAGNOSIS — E1129 Type 2 diabetes mellitus with other diabetic kidney complication: Secondary | ICD-10-CM | POA: Diagnosis not present

## 2014-08-23 DIAGNOSIS — Z23 Encounter for immunization: Secondary | ICD-10-CM | POA: Diagnosis not present

## 2014-08-23 DIAGNOSIS — R809 Proteinuria, unspecified: Secondary | ICD-10-CM | POA: Diagnosis not present

## 2014-08-23 DIAGNOSIS — M109 Gout, unspecified: Secondary | ICD-10-CM | POA: Diagnosis not present

## 2014-08-23 DIAGNOSIS — E785 Hyperlipidemia, unspecified: Secondary | ICD-10-CM | POA: Diagnosis not present

## 2014-10-01 DIAGNOSIS — I639 Cerebral infarction, unspecified: Secondary | ICD-10-CM

## 2014-10-01 HISTORY — DX: Cerebral infarction, unspecified: I63.9

## 2014-10-02 DIAGNOSIS — I10 Essential (primary) hypertension: Secondary | ICD-10-CM | POA: Diagnosis not present

## 2014-10-02 DIAGNOSIS — Z6841 Body Mass Index (BMI) 40.0 and over, adult: Secondary | ICD-10-CM | POA: Diagnosis not present

## 2014-10-02 DIAGNOSIS — E1121 Type 2 diabetes mellitus with diabetic nephropathy: Secondary | ICD-10-CM | POA: Diagnosis not present

## 2014-10-25 ENCOUNTER — Encounter (HOSPITAL_COMMUNITY): Payer: Self-pay

## 2014-10-25 ENCOUNTER — Inpatient Hospital Stay (HOSPITAL_COMMUNITY)
Admission: EM | Admit: 2014-10-25 | Discharge: 2014-10-28 | DRG: 062 | Disposition: A | Discharge status: Home / Self Care | Payer: Medicare Other | Attending: Neurology | Admitting: Neurology

## 2014-10-25 ENCOUNTER — Emergency Department (HOSPITAL_COMMUNITY): Payer: Medicare Other

## 2014-10-25 DIAGNOSIS — I10 Essential (primary) hypertension: Secondary | ICD-10-CM | POA: Diagnosis present

## 2014-10-25 DIAGNOSIS — Z6841 Body Mass Index (BMI) 40.0 and over, adult: Secondary | ICD-10-CM

## 2014-10-25 DIAGNOSIS — I679 Cerebrovascular disease, unspecified: Secondary | ICD-10-CM | POA: Diagnosis not present

## 2014-10-25 DIAGNOSIS — I639 Cerebral infarction, unspecified: Secondary | ICD-10-CM

## 2014-10-25 DIAGNOSIS — I6330 Cerebral infarction due to thrombosis of unspecified cerebral artery: Secondary | ICD-10-CM | POA: Diagnosis not present

## 2014-10-25 DIAGNOSIS — Z7982 Long term (current) use of aspirin: Secondary | ICD-10-CM | POA: Diagnosis not present

## 2014-10-25 DIAGNOSIS — I6529 Occlusion and stenosis of unspecified carotid artery: Secondary | ICD-10-CM | POA: Insufficient documentation

## 2014-10-25 DIAGNOSIS — R29810 Facial weakness: Secondary | ICD-10-CM | POA: Diagnosis not present

## 2014-10-25 DIAGNOSIS — Z823 Family history of stroke: Secondary | ICD-10-CM | POA: Diagnosis not present

## 2014-10-25 DIAGNOSIS — E119 Type 2 diabetes mellitus without complications: Secondary | ICD-10-CM | POA: Diagnosis present

## 2014-10-25 DIAGNOSIS — Z79899 Other long term (current) drug therapy: Secondary | ICD-10-CM | POA: Diagnosis not present

## 2014-10-25 DIAGNOSIS — F40240 Claustrophobia: Secondary | ICD-10-CM | POA: Diagnosis present

## 2014-10-25 DIAGNOSIS — E785 Hyperlipidemia, unspecified: Secondary | ICD-10-CM | POA: Diagnosis not present

## 2014-10-25 DIAGNOSIS — I6521 Occlusion and stenosis of right carotid artery: Secondary | ICD-10-CM | POA: Diagnosis not present

## 2014-10-25 DIAGNOSIS — R001 Bradycardia, unspecified: Secondary | ICD-10-CM | POA: Diagnosis present

## 2014-10-25 DIAGNOSIS — E780 Pure hypercholesterolemia: Secondary | ICD-10-CM | POA: Diagnosis present

## 2014-10-25 DIAGNOSIS — I6523 Occlusion and stenosis of bilateral carotid arteries: Secondary | ICD-10-CM | POA: Diagnosis present

## 2014-10-25 DIAGNOSIS — Z794 Long term (current) use of insulin: Secondary | ICD-10-CM | POA: Diagnosis not present

## 2014-10-25 DIAGNOSIS — Z7902 Long term (current) use of antithrombotics/antiplatelets: Secondary | ICD-10-CM | POA: Diagnosis not present

## 2014-10-25 DIAGNOSIS — I6320 Cerebral infarction due to unspecified occlusion or stenosis of unspecified precerebral arteries: Secondary | ICD-10-CM | POA: Diagnosis present

## 2014-10-25 DIAGNOSIS — I63511 Cerebral infarction due to unspecified occlusion or stenosis of right middle cerebral artery: Principal | ICD-10-CM | POA: Diagnosis present

## 2014-10-25 DIAGNOSIS — I6601 Occlusion and stenosis of right middle cerebral artery: Secondary | ICD-10-CM | POA: Diagnosis not present

## 2014-10-25 DIAGNOSIS — I739 Peripheral vascular disease, unspecified: Secondary | ICD-10-CM | POA: Diagnosis present

## 2014-10-25 DIAGNOSIS — R531 Weakness: Secondary | ICD-10-CM | POA: Diagnosis not present

## 2014-10-25 DIAGNOSIS — E1159 Type 2 diabetes mellitus with other circulatory complications: Secondary | ICD-10-CM | POA: Diagnosis not present

## 2014-10-25 DIAGNOSIS — G4733 Obstructive sleep apnea (adult) (pediatric): Secondary | ICD-10-CM | POA: Diagnosis present

## 2014-10-25 DIAGNOSIS — I6503 Occlusion and stenosis of bilateral vertebral arteries: Secondary | ICD-10-CM | POA: Diagnosis not present

## 2014-10-25 DIAGNOSIS — I6359 Cerebral infarction due to unspecified occlusion or stenosis of other cerebral artery: Secondary | ICD-10-CM | POA: Diagnosis not present

## 2014-10-25 DIAGNOSIS — Z9889 Other specified postprocedural states: Secondary | ICD-10-CM | POA: Diagnosis not present

## 2014-10-25 DIAGNOSIS — R1310 Dysphagia, unspecified: Secondary | ICD-10-CM | POA: Diagnosis not present

## 2014-10-25 HISTORY — DX: Type 2 diabetes mellitus without complications: E11.9

## 2014-10-25 HISTORY — DX: Essential (primary) hypertension: I10

## 2014-10-25 HISTORY — DX: Pure hypercholesterolemia, unspecified: E78.00

## 2014-10-25 LAB — PROTIME-INR
INR: 0.97 (ref 0.00–1.49)
Prothrombin Time: 13 seconds (ref 11.6–15.2)

## 2014-10-25 LAB — GLUCOSE, CAPILLARY
Glucose-Capillary: 80 mg/dL (ref 70–99)
Glucose-Capillary: 67 mg/dL — ABNORMAL LOW (ref 70–99)

## 2014-10-25 LAB — COMPREHENSIVE METABOLIC PANEL
ALT: 16 U/L (ref 0–53)
AST: 17 U/L (ref 0–37)
Albumin: 3.9 g/dL (ref 3.5–5.2)
Alkaline Phosphatase: 114 U/L (ref 39–117)
Anion gap: 13 (ref 5–15)
BUN: 14 mg/dL (ref 6–23)
CO2: 27 mmol/L (ref 19–32)
Calcium: 9.3 mg/dL (ref 8.4–10.5)
Chloride: 99 mEq/L (ref 96–112)
Creatinine, Ser: 1 mg/dL (ref 0.50–1.35)
GFR calc Af Amer: 87 mL/min — ABNORMAL LOW (ref >90)
GFR calc non Af Amer: 75 mL/min — ABNORMAL LOW (ref >90)
Glucose, Bld: 70 mg/dL (ref 70–99)
Potassium: 3.5 mmol/L (ref 3.5–5.1)
Sodium: 139 mmol/L (ref 135–145)
Total Bilirubin: 0.3 mg/dL (ref 0.3–1.2)
Total Protein: 7.8 g/dL (ref 6.0–8.3)

## 2014-10-25 LAB — CBC
HCT: 44.8 % (ref 39.0–52.0)
Hemoglobin: 14.5 g/dL (ref 13.0–17.0)
MCH: 24.3 pg — ABNORMAL LOW (ref 26.0–34.0)
MCHC: 32.4 g/dL (ref 30.0–36.0)
MCV: 75 fL — ABNORMAL LOW (ref 78.0–100.0)
Platelets: 295 10*3/uL (ref 150–400)
RBC: 5.97 MIL/uL — ABNORMAL HIGH (ref 4.22–5.81)
RDW: 16 % — ABNORMAL HIGH (ref 11.5–15.5)
WBC: 10.2 10*3/uL (ref 4.0–10.5)

## 2014-10-25 LAB — CBG MONITORING, ED
GLUCOSE-CAPILLARY: 62 mg/dL — AB (ref 70–99)
Glucose-Capillary: 138 mg/dL — ABNORMAL HIGH (ref 70–99)

## 2014-10-25 LAB — DIFFERENTIAL
Basophils Absolute: 0 10*3/uL (ref 0.0–0.1)
Basophils Relative: 0 % (ref 0–1)
Eosinophils Absolute: 0.1 10*3/uL (ref 0.0–0.7)
Eosinophils Relative: 1 % (ref 0–5)
Lymphocytes Relative: 24 % (ref 12–46)
Lymphs Abs: 2.4 10*3/uL (ref 0.7–4.0)
Monocytes Absolute: 0.6 10*3/uL (ref 0.1–1.0)
Monocytes Relative: 6 % (ref 3–12)
Neutro Abs: 7.1 10*3/uL (ref 1.7–7.7)
Neutrophils Relative %: 69 % (ref 43–77)

## 2014-10-25 LAB — I-STAT CHEM 8, ED
BUN: 17 mg/dL (ref 6–23)
Calcium, Ion: 1 mmol/L — ABNORMAL LOW (ref 1.13–1.30)
Chloride: 100 mEq/L (ref 96–112)
Creatinine, Ser: 0.9 mg/dL (ref 0.50–1.35)
Glucose, Bld: 74 mg/dL (ref 70–99)
HCT: 51 % (ref 39.0–52.0)
Hemoglobin: 17.3 g/dL — ABNORMAL HIGH (ref 13.0–17.0)
Potassium: 3.5 mmol/L (ref 3.5–5.1)
Sodium: 141 mmol/L (ref 135–145)
TCO2: 26 mmol/L (ref 0–100)

## 2014-10-25 LAB — ETHANOL: Alcohol, Ethyl (B): <5 mg/dL (ref 0–9)

## 2014-10-25 LAB — I-STAT TROPONIN, ED: Troponin i, poc: 0 ng/mL (ref 0.00–0.08)

## 2014-10-25 LAB — APTT: aPTT: 32 seconds (ref 24–37)

## 2014-10-25 LAB — MRSA PCR SCREENING: MRSA BY PCR: NEGATIVE

## 2014-10-25 MED ORDER — INSULIN ASPART 100 UNIT/ML ~~LOC~~ SOLN
0.0000 [IU] | SUBCUTANEOUS | Status: DC
  Administered 2014-10-26: 3 [IU] via SUBCUTANEOUS

## 2014-10-25 MED ORDER — DEXTROSE 50 % IV SOLN
25.0000 mL | Freq: Once | INTRAVENOUS | Status: AC
  Administered 2014-10-25: 25 mL via INTRAVENOUS

## 2014-10-25 MED ORDER — SODIUM CHLORIDE 0.9 % IV SOLN
INTRAVENOUS | Status: DC
  Administered 2014-10-25: 15:00:00 via INTRAVENOUS

## 2014-10-25 MED ORDER — NICARDIPINE HCL IN NACL 20-0.86 MG/200ML-% IV SOLN
3.0000 mg/h | INTRAVENOUS | Status: DC
  Administered 2014-10-25 (×2): 5 mg/h via INTRAVENOUS
  Filled 2014-10-25 (×3): qty 200

## 2014-10-25 MED ORDER — STROKE: EARLY STAGES OF RECOVERY BOOK
Freq: Once | Status: AC
  Administered 2014-10-25: 15:00:00
  Filled 2014-10-25: qty 1

## 2014-10-25 MED ORDER — ALTEPLASE (STROKE) FULL DOSE INFUSION
90.0000 mg | Freq: Once | INTRAVENOUS | Status: AC
  Administered 2014-10-25: 90 mg via INTRAVENOUS
  Filled 2014-10-25: qty 90

## 2014-10-25 MED ORDER — ACETAMINOPHEN 650 MG RE SUPP: 650.0000 mg | RECTAL | Status: DC | PRN

## 2014-10-25 MED ORDER — PANTOPRAZOLE SODIUM 40 MG IV SOLR
40.0000 mg | Freq: Every day | INTRAVENOUS | Status: DC
  Administered 2014-10-25: 40 mg via INTRAVENOUS
  Filled 2014-10-25 (×2): qty 40

## 2014-10-25 MED ORDER — DEXTROSE 50 % IV SOLN
25.0000 mL | Freq: Once | INTRAVENOUS | Status: AC
  Administered 2014-10-25: 25 mL via INTRAVENOUS
  Filled 2014-10-25: qty 50

## 2014-10-25 MED ORDER — DEXTROSE 50 % IV SOLN
INTRAVENOUS | Status: AC
  Filled 2014-10-25: qty 50

## 2014-10-25 MED ORDER — HYDRALAZINE HCL 20 MG/ML IJ SOLN
5.0000 mg | Freq: Four times a day (QID) | INTRAMUSCULAR | Status: DC | PRN
  Administered 2014-10-25: 5 mg via INTRAVENOUS
  Filled 2014-10-25: qty 1

## 2014-10-25 MED ORDER — LABETALOL HCL 5 MG/ML IV SOLN: 10.0000 mg | INTRAVENOUS | Status: DC | PRN

## 2014-10-25 MED ORDER — ACETAMINOPHEN 325 MG PO TABS: 650.0000 mg | ORAL_TABLET | ORAL | Status: DC | PRN

## 2014-10-25 NOTE — ED Notes (Signed)
[facility], Pt from home began having some stroke like symptoms. Facial droop and left side facial paralysis. Pt has some left arm weakness . CBG of 80 with EMS. Pt reportedly took his insulin this morning and did not eat. Pt is a&o x4.

## 2014-10-25 NOTE — Code Documentation (Signed)
68 year old presents to ED as Code Stroke from [facility].  Family states he was normal and about 1115 when starting to take family pics they noticed he developed slurred speech and left facial droop with drooling.  EMS was called and the code stroke was called in the field at 1243.  He arrived at 90.  NIHSS 4 - left facial droop, dysarthria, and ataxia left arm.  Dr. Leonel Ramsay speaking with patient and family regarding treatment options.  CBG in route was 80.  Patient is diabetic -states he took his Lantus today but has not eaten anything today.  Repeat CBG here 62.  Dr. Leonel Ramsay aware.  1/2 amp D5W IV given per order.  Repeat CBG 138.  Neuro sx remain same.  Patient waiting on some additional family members to arrive before deciding on tPA.  Daughter arrives = speaks with Dr. Leonel Ramsay and the patient.  Mr. Rothgeb PCP just happens to be in ED - family conferrring with him re: treatment options.  Decision to treat was made and tPA requested.  Patient tol tPA infusion.  Transferred to 3M13 without incident.  Patient transferred himself to the bed from the stretcher.  RN  Wynona Canes noted right PIV site on arrival to be intact - after transfer to the bed - right PIV site with swelling noted - tPA stopped immediately and transferred to left PIV site.  Pressure held on right PIV site - Dr. Leonel Ramsay aware.  Handoff to [employer].  Family updated.

## 2014-10-25 NOTE — Progress Notes (Signed)
Bilateral carotid artery duplex completed:  1-39% ICA stenosis.  Vertebral artery flow is antegrade.     

## 2014-10-25 NOTE — Consult Note (Deleted)
Neurology Consultation Reason for Consult: Facial droop and slurred speech Referring Physician: Tawnya Crook, M  CC: Facial droop  History is obtained from: Patient, family  HPI: Jacob Avery is a 68 y.o. male with a history of diabetes, hypertension who presents with sudden onset left facial droop, slurred speech that started approximately noon today. His family was over in a typical picture and he was normal at that time, then his daughter noticed that something was not right about him. He was then noticed that he was slurring his speech and therefore EMS was called out of concern for stroke.   LKW: 11:50 PM tpa given?: Yes  NIH stroke scale: 4  ROS: A 14 point ROS was performed and is negative except as noted in the HPI.   Past Medical History  Diagnosis Date  . Diabetes mellitus without complication     Family History: Family history of stroke  Social History: Tob: never smoker  Exam: Current vital signs: BP 170/77 mmHg  Pulse 58  Temp(Src) 97.7 F (36.5 C) (Oral)  Resp 14  Ht 5\' 9"  (1.753 m)  Wt 130.1 kg (286 lb 13.1 oz)  BMI 42.34 kg/m2  SpO2 95% Vital signs in last 24 hours: Temp:  [97.7 F (36.5 C)] 97.7 F (36.5 C) (12/25 1332) Pulse Rate:  [32-75] 58 (12/25 1700) Resp:  [10-22] 14 (12/25 1700) BP: (120-216)/(70-93) 170/77 mmHg (12/25 1700) SpO2:  [92 %-99 %] 95 % (12/25 1700) Weight:  [130.1 kg (286 lb 13.1 oz)-131.543 kg (290 lb)] 130.1 kg (286 lb 13.1 oz) (12/25 1445)   Physical Exam  Constitutional:  appears morbidly obese Psych: Affect appropriate to situation Eyes: No scleral injection HENT: No OP obstrucion Head: Normocephalic.  Cardiovascular: Normal rate and regular rhythm.  Respiratory: Effort normal and breath sounds normal to anterior ascultation GI: Soft.  No distension. There is no tenderness.  Skin: WDI  Neuro: Mental Status: Patient is awake, alert, oriented to person, place, month, year, and situation. Patient is able to give a  clear and coherent history. No signs of aphasia or neglect He is dysarthric Cranial Nerves: II: Visual Fields are full. Pupils are equal, round, and reactive to light.   III,IV, VI: EOMI without ptosis or diploplia.  V: Facial sensation is symmetric to temperature VII: Facial movement is left facial droop VIII: hearing is intact to voice X: Uvula elevates symmetrically XI: Shoulder shrug is symmetric. XII: tongue is midline without atrophy or fasciculations.  Motor: Tone is normal. Bulk is normal. 5/5 strength was present in the right side and left leg. He has good proximal strength in his left arm without drift, but does have impaired fine motor movements Sensory: Sensation is symmetric to light touch and temperature in the arms and legs. Deep Tendon Reflexes: 2+ and symmetric in the biceps and patellae.  Plantars: Toes are downgoing bilaterally.  Cerebellar: FNF and HKS are intact on the right, he is ataxic in the left arm.    I have reviewed labs in epic and the results pertinent to this consultation are: CMP-unremarkable  I have reviewed the images obtained: CT head - negative  Impression: 68 yo M with likley small subcortical infarct. He responded well to tPA and had significant improvement. He will need a stroke workup.   Recommendations: 1. HgbA1c, fasting lipid panel 2. MRI, MRA  of the brain without contrast 3. Frequent neuro checks 4. Echocardiogram 5. Carotid dopplers 6. Prophylactic therapy-Antiplatelet med: none for 24 hours post tpa.  7. Risk factor  modification 8. Telemetry monitoring 9. PT consult, OT consult, Speech consult 10. He is npo due to failed swallow screen and took basal insulin already, will use SSI for DM. Will need more basal insulin likely once eating.  11. Hold PO anti-hypertensives, due to bradycardia will use cardene if further hypertension > 185/110   This patient is critically ill and at significant risk of neurological worsening,  death and care requires constant monitoring of vital signs, hemodynamics,respiratory and cardiac monitoring, neurological assessment, discussion with family, other specialists and medical decision making of high complexity. I spent 50 minutes of neurocritical care time  in the care of  this patient.  Roland Rack, MD [facility] [phone]  If 7pm- 7am, please page neurology on call as listed in [facility]. 10/25/2014  5:57 PM

## 2014-10-25 NOTE — H&P (Addendum)
Neurology Consultation Reason for Consult: Facial droop and slurred speech Referring Physician: Tawnya Crook, M  CC: Facial droop  History is obtained from: Patient, family  HPI: Jacob Avery is a 68 y.o. male with a history of diabetes, hypertension who presents with sudden onset left facial droop, slurred speech that started approximately noon today. His family was over in a typical picture and he was normal at that time, then his daughter noticed that something was not right about him. He was then noticed that he was slurring his speech and therefore EMS was called out of concern for stroke.   LKW: 11:50 PM tpa given?: Yes  NIH stroke scale: 4 tpa delayed due to family discussions(waiting for daughter and discussing)  ROS: A 14 point ROS was performed and is negative except as noted in the HPI.   Past Medical History  Diagnosis Date  . Diabetes mellitus without complication   . Hypertension   . Hypercholesterolemia     Family History: Family history of stroke  Social History: Tob: never smoker  Exam: Current vital signs: BP 173/72 mmHg  Pulse 59  Temp(Src) 97.7 F (36.5 C) (Oral)  Resp 13  Ht 5\' 9"  (1.753 m)  Wt 130.1 kg (286 lb 13.1 oz)  BMI 42.34 kg/m2  SpO2 95% Vital signs in last 24 hours: Temp:  [97.7 F (36.5 C)] 97.7 F (36.5 C) (12/25 1332) Pulse Rate:  [32-75] 59 (12/25 1730) Resp:  [10-22] 13 (12/25 1730) BP: (120-216)/(70-93) 173/72 mmHg (12/25 1730) SpO2:  [92 %-99 %] 95 % (12/25 1730) Weight:  [130.1 kg (286 lb 13.1 oz)-131.543 kg (290 lb)] 130.1 kg (286 lb 13.1 oz) (12/25 1445)   Physical Exam  Constitutional:  appears morbidly obese Psych: Affect appropriate to situation Eyes: No scleral injection HENT: No OP obstrucion Head: Normocephalic.  Cardiovascular: Normal rate and regular rhythm.  Respiratory: Effort normal and breath sounds normal to anterior ascultation GI: Soft.  No distension. There is no tenderness.  Skin: WDI  Neuro: Mental  Status: Patient is awake, alert, oriented to person, place, month, year, and situation. Patient is able to give a clear and coherent history. No signs of aphasia or neglect He is dysarthric Cranial Nerves: II: Visual Fields are full. Pupils are equal, round, and reactive to light.   III,IV, VI: EOMI without ptosis or diploplia.  V: Facial sensation is symmetric to temperature VII: Facial movement is left facial droop VIII: hearing is intact to voice X: Uvula elevates symmetrically XI: Shoulder shrug is symmetric. XII: tongue is midline without atrophy or fasciculations.  Motor: Tone is normal. Bulk is normal. 5/5 strength was present in the right side and left leg. He has good proximal strength in his left arm without drift, but does have impaired fine motor movements Sensory: Sensation is symmetric to light touch and temperature in the arms and legs. Deep Tendon Reflexes: 2+ and symmetric in the biceps and patellae.  Plantars: Toes are downgoing bilaterally.  Cerebellar: FNF and HKS are intact on the right, he is ataxic in the left arm.    I have reviewed labs in epic and the results pertinent to this consultation are: CMP-unremarkable  I have reviewed the images obtained: CT head - negative  Impression: 68 yo M with likley small subcortical infarct. He responded well to tPA and had significant improvement. He will need a stroke workup.   Recommendations: 1. HgbA1c, fasting lipid panel 2. MRI, MRA  of the brain without contrast 3. Frequent neuro checks 4.  Echocardiogram 5. Carotid dopplers 6. Prophylactic therapy-Antiplatelet med: none for 24 hours post tpa.  7. Risk factor modification 8. Telemetry monitoring 9. PT consult, OT consult, Speech consult 10. He is npo due to failed swallow screen and took basal insulin already, will use SSI for DM. Will need more basal insulin likely once eating.  11. Hold PO anti-hypertensives, due to bradycardia will use cardene if  further hypertension > 185/110   This patient is critically ill and at significant risk of neurological worsening, death and care requires constant monitoring of vital signs, hemodynamics,respiratory and cardiac monitoring, neurological assessment, discussion with family, other specialists and medical decision making of high complexity. I spent 50 minutes of neurocritical care time  in the care of  this patient.  Roland Rack, MD [facility] [phone]  If 7pm- 7am, please page neurology on call as listed in [place]. 10/25/2014  5:57 PM

## 2014-10-26 ENCOUNTER — Inpatient Hospital Stay (HOSPITAL_COMMUNITY): Payer: Medicare Other

## 2014-10-26 LAB — GLUCOSE, CAPILLARY
GLUCOSE-CAPILLARY: 130 mg/dL — AB (ref 70–99)
GLUCOSE-CAPILLARY: 121 mg/dL — AB (ref 70–99)
Glucose-Capillary: 98 mg/dL (ref 70–99)
Glucose-Capillary: 114 mg/dL — ABNORMAL HIGH (ref 70–99)
Glucose-Capillary: 151 mg/dL — ABNORMAL HIGH (ref 70–99)

## 2014-10-26 LAB — LIPID PANEL
CHOL/HDL RATIO: 4.8 ratio
Cholesterol: 138 mg/dL (ref 0–200)
HDL: 29 mg/dL — AB (ref >39)
LDL Cholesterol: 91 mg/dL (ref 0–99)
Triglycerides: 91 mg/dL (ref <150)
VLDL: 18 mg/dL (ref 0–40)

## 2014-10-26 LAB — HEMOGLOBIN A1C
Hgb A1c MFr Bld: 7.7 % — ABNORMAL HIGH (ref <5.7)
MEAN PLASMA GLUCOSE: 174 mg/dL — AB (ref <117)

## 2014-10-26 MED ORDER — INSULIN ASPART 100 UNIT/ML ~~LOC~~ SOLN
0.0000 [IU] | Freq: Three times a day (TID) | SUBCUTANEOUS | Status: DC
  Administered 2014-10-27 – 2014-10-28 (×5): 3 [IU] via SUBCUTANEOUS

## 2014-10-26 MED ORDER — LORAZEPAM 2 MG/ML IJ SOLN: 1.0000 mg | Freq: Once | INTRAMUSCULAR | Status: DC

## 2014-10-26 MED ORDER — ASPIRIN EC 325 MG PO TBEC
325.0000 mg | DELAYED_RELEASE_TABLET | Freq: Every day | ORAL | Status: DC
  Administered 2014-10-26 – 2014-10-28 (×3): 325 mg via ORAL
  Filled 2014-10-26 (×3): qty 1

## 2014-10-26 MED ORDER — ASPIRIN 300 MG RE SUPP: 300.0000 mg | Freq: Every day | RECTAL | Status: DC

## 2014-10-26 MED ORDER — PANTOPRAZOLE SODIUM 40 MG PO TBEC
40.0000 mg | DELAYED_RELEASE_TABLET | Freq: Every day | ORAL | Status: DC
  Administered 2014-10-27 – 2014-10-28 (×2): 40 mg via ORAL
  Filled 2014-10-26 (×2): qty 1

## 2014-10-26 MED ORDER — ATORVASTATIN CALCIUM 80 MG PO TABS
80.0000 mg | ORAL_TABLET | Freq: Every day | ORAL | Status: DC
  Administered 2014-10-26 – 2014-10-27 (×2): 80 mg via ORAL
  Filled 2014-10-26 (×2): qty 1

## 2014-10-26 NOTE — Progress Notes (Signed)
Patient arrived to unit via wheelchair, alert and oriented, with no c/o pain or distress noted. RN orient patient to room and equipment. Vitals taken, call bell within reach and alarm set on bed for safety. Aisha RN

## 2014-10-26 NOTE — Evaluation (Signed)
Clinical/Bedside Swallow Evaluation Patient Details  Name: Jacob Avery MRN: [id number] Date of Birth: 04-21-1946  Today's Date: 10/26/2014 Time: 3159-4585 SLP Time Calculation (min) (ACUTE ONLY): 15 min  Past Medical History:  Past Medical History  Diagnosis Date  . Diabetes mellitus without complication   . Hypertension   . Hypercholesterolemia    Past Surgical History: History reviewed. No pertinent past surgical history. HPI:  68 yo M with likley small subcortical infarct. He responded well to tPA and had significant improvement   Assessment / Plan / Recommendation Clinical Impression  Pt presents with normal oropharyngeal swallow with adequate mastication, swift swallow trigger, and no s/s aspiration.  Recommend resume regular diet, thin liquids.  No speech /cognitive deficits.  SLP to sign off.     Aspiration Risk  Mild    Diet Recommendation Regular;Thin liquid   Liquid Administration via: Cup;Straw Medication Administration: Whole meds with liquid Supervision: Patient able to self feed    Other  Recommendations Oral Care Recommendations: Oral care BID   Follow Up Recommendations  None      Swallow Study Prior Functional Status       General Date of Onset: 10/25/14 HPI: 68 yo M with likley small subcortical infarct. He responded well to tPA and had significant improvement Type of Study: Bedside swallow evaluation Previous Swallow Assessment: none per records Diet Prior to this Study: NPO Temperature Spikes Noted: No Respiratory Status: Room air Behavior/Cognition: Alert;Cooperative Oral Cavity - Dentition: Dentures, top;Dentures, bottom Self-Feeding Abilities: Able to feed self Patient Positioning: Upright in bed Baseline Vocal Quality: Clear Volitional Cough: Strong Volitional Swallow: Able to elicit    Oral/Motor/Sensory Function Overall Oral Motor/Sensory Function:  (mild asymmetry CN VII left)   Ice Chips Ice chips: Within functional limits   Thin  Liquid Thin Liquid: Within functional limits    Nectar Thick Nectar Thick Liquid: Not tested   Honey Thick Honey Thick Liquid: Not tested   Puree Puree: Within functional limits Presentation: [place]. [place], [place] CCC/SLP Pager [phone]     Solid: Within functional limits Presentation: Self Fed       Jacob Avery 10/26/2014,3:55 PM

## 2014-10-26 NOTE — Progress Notes (Signed)
Echocardiogram 2D Echocardiogram has been performed.  Joelene Millin 10/26/2014, 9:07 AM

## 2014-10-26 NOTE — Progress Notes (Signed)
STROKE TEAM PROGRESS NOTE   HISTORY Jacob Avery is a 68 y.o. male with a history of diabetes, hypertension who presents with sudden onset left facial droop, slurred speech that started approximately noon today. His family was over in a typical picture and he was normal at that time, then his daughter noticed that something was not right about him. He was then noticed that he was slurring his speech and therefore EMS was called out of concern for stroke.   LKW: 11:50 PM tpa given?: Yes  NIH stroke scale: 4 tpa delayed due to family discussions(waiting for daughter and discussing)   SUBJECTIVE (INTERVAL HISTORY) His wife is at the bedside.  Overall he feels his condition is improved. He says he was at home with family and started feeling some drooling on the left side of the face and then his daughter noted speech slurring. He feels his symptoms have mostly resolved.    OBJECTIVE Temp:  [97.7 F (36.5 C)-98.1 F (36.7 C)] 98.1 F (36.7 C) (12/26 0400) Pulse Rate:  [32-88] 65 (12/26 0700) Cardiac Rhythm:  [-] Normal sinus rhythm (12/25 2000) Resp:  [10-22] 21 (12/26 0700) BP: (120-216)/(56-93) 158/79 mmHg (12/26 0700) SpO2:  [90 %-99 %] 93 % (12/26 0700) Weight:  [286 lb 13.1 oz (130.1 kg)-290 lb (131.543 kg)] 286 lb 13.1 oz (130.1 kg) (12/25 1445)   Recent Labs Lab 10/25/14 1325 10/25/14 1339 10/25/14 1555 10/25/14 1922 10/26/14 0340  GLUCAP 62* 138* 80 67* 98    Recent Labs Lab 10/25/14 1311 10/25/14 1324  NA 139 141  K 3.5 3.5  CL 99 100  CO2 27  --   GLUCOSE 70 74  BUN 14 17  CREATININE 1.00 0.90  CALCIUM 9.3  --     Recent Labs Lab 10/25/14 1311  AST 17  ALT 16  ALKPHOS 114  BILITOT 0.3  PROT 7.8  ALBUMIN 3.9    Recent Labs Lab 10/25/14 1311 10/25/14 1324  WBC 10.2  --   NEUTROABS 7.1  --   HGB 14.5 17.3*  HCT 44.8 51.0  MCV 75.0*  --   PLT 295  --    No results for input(s): CKTOTAL, CKMB, CKMBINDEX, TROPONINI in the last 168  hours.  Recent Labs  10/25/14 1311  LABPROT 13.0  INR 0.97   No results for input(s): COLORURINE, LABSPEC, PHURINE, GLUCOSEU, HGBUR, BILIRUBINUR, KETONESUR, PROTEINUR, UROBILINOGEN, NITRITE, LEUKOCYTESUR in the last 72 hours.  Invalid input(s): APPERANCEUR     Component Value Date/Time   CHOL 138 10/26/2014 0240   TRIG 91 10/26/2014 0240   HDL 29* 10/26/2014 0240   CHOLHDL 4.8 10/26/2014 0240   VLDL 18 10/26/2014 0240   LDLCALC 91 10/26/2014 0240   No results found for: HGBA1C No results found for: LABOPIA, COCAINSCRNUR, LABBENZ, AMPHETMU, THCU, LABBARB   Recent Labs Lab 10/25/14 1311  ETH <5    Ct Head Wo Contrast 10/25/2014    Small area of asymmetric RIGHT temporal lobe cortical hypodensity could represent early acute infarct.    PHYSICAL EXAM Physical exam: Exam: Gen: NAD Eyes: anicteric sclerae, moist conjunctivae                    CV: no MRG Mental Status: Alert, follows commands, oriented, good historian  Neuro: Detailed Neurologic Exam  Speech:    No aphasia, no dysarthria  Cranial Nerves:    The pupils are equal, round, and reactive to light. EOMI. Fundi flat. Conjugate gaze, No gaze preference. Visual  fields full. Mild right-sided NL flattening otherwise face symmetric, Tongue midline.   Motor Observation:    no involuntary movements noted. Tone appears normal. Good fine motor movements.      Strength:  5/5 strength throughout     Sensation:  Intact to LT  DTRs: Absent lowers, intact uppers  Plantars downgoing.   Gait: Unable to test      ASSESSMENT/PLAN Mr. Jacob Avery is a 68 y.o. male with history of diabetes, hypertension, and hyperlipidemia  presenting with left facial droop and slurred speech. He did receive IV t-PA  90 mg at 1345 on 10/25/2014..   Stroke:  Non-dominant infarct secondary to small vessel disease.  Resultant: possible some mild NL flattening but dysarthria and fine motor difficulties have resolved  MRI   pending  MRA  pending  Carotid Doppler  pending  2D Echo  pending  LDL 91  HgbA1c pending  SCDs for VTE prophylaxis  Diet NPO time specified no liquids  aspirin 81 mg orally every day prior to admission, now on no antithrombotic secondary to TPA.  Ongoing aggressive stroke risk factor management  Therapy recommendations:  Pending  Disposition:  Pending  Hypertension  Home meds: Norvasc 10 mg daily and Hyzaar 100/25 one daily.  Stable  Hyperlipidemia  Home meds: Lipitor 80 mg daily. Not yet resumed. Patient currently NPO.  LDL 91, goal < 70  Verify compliance  Continue statin at discharge  Diabetes  HgbA1c pending goal < 7.0  Controlled  Other Stroke Risk Factors  Advanced age  Obesity, Body mass index is 42.34 kg/(m^2).   Family hx stroke (  )   Other Active Problems  S/P TPA    Other Pertinent History    This patient is post TPA administration and at significant risk of neurological worsening, death and care requires constant monitoring of vital signs, hemodynamics,respiratory and cardiac monitoring,review of multiple databases, neurological assessment, discussion with family, other specialists and medical decision making of high complexity. I spent 35 inutes of neurocritical care time in the care of this patient.  Personally examined patient and images, and have documentes history, physical, neuro exam,assessment and plan as stated above.   Sarina Ill, MD Stroke Neurology [phone] [facility]       To contact Stroke Continuity provider, please refer to [email or web address] After hours, contact General Neurology

## 2014-10-27 ENCOUNTER — Encounter (HOSPITAL_COMMUNITY): Payer: Self-pay | Admitting: Radiology

## 2014-10-27 ENCOUNTER — Inpatient Hospital Stay (HOSPITAL_COMMUNITY): Payer: Medicare Other

## 2014-10-27 LAB — GLUCOSE, CAPILLARY
GLUCOSE-CAPILLARY: 173 mg/dL — AB (ref 70–99)
GLUCOSE-CAPILLARY: 171 mg/dL — AB (ref 70–99)
Glucose-Capillary: 157 mg/dL — ABNORMAL HIGH (ref 70–99)
Glucose-Capillary: 147 mg/dL — ABNORMAL HIGH (ref 70–99)

## 2014-10-27 MED ORDER — IOHEXOL 350 MG/ML SOLN
100.0000 mL | Freq: Once | INTRAVENOUS | Status: AC | PRN
  Administered 2014-10-27: 100 mL via INTRAVENOUS

## 2014-10-27 NOTE — Progress Notes (Signed)
STROKE TEAM PROGRESS NOTE   HISTORY Jacob Avery is a 68 y.o. male with a history of diabetes, hypertension who presents with sudden onset left facial droop, slurred speech that started approximately noon today. His family was over in a typical picture and he was normal at that time, then his daughter noticed that something was not right about him. He was then noticed that he was slurring his speech and therefore EMS was called out of concern for stroke.   LKW: 11:50 PM tpa given?: Yes  NIH stroke scale: 4 tpa delayed due to family discussions(waiting for daughter and discussing)   SUBJECTIVE (INTERVAL HISTORY) His wife is at the bedside.  Overall he feels his condition is improved. He says he was at home with family and started feeling some drooling on the left side of the face and then his daughter noted speech slurring. He feels his symptoms have mostly resolved.    OBJECTIVE Temp:  [97.4 F (36.3 C)-98.7 F (37.1 C)] 98.3 F (36.8 C) (12/27 0942) Pulse Rate:  [60-68] 66 (12/27 0942) Cardiac Rhythm:  [-] Normal sinus rhythm;Sinus bradycardia (12/26 2000) Resp:  [16-19] 18 (12/27 0942) BP: (146-170)/(66-79) 157/66 mmHg (12/27 0942) SpO2:  [95 %-100 %] 95 % (12/27 0942)   Recent Labs Lab 10/26/14 1205 10/26/14 1548 10/26/14 2113 10/27/14 0638 10/27/14 1118  GLUCAP 121* 114* 151* 157* 173*    Recent Labs Lab 10/25/14 1311 10/25/14 1324  NA 139 141  K 3.5 3.5  CL 99 100  CO2 27  --   GLUCOSE 70 74  BUN 14 17  CREATININE 1.00 0.90  CALCIUM 9.3  --     Recent Labs Lab 10/25/14 1311  AST 17  ALT 16  ALKPHOS 114  BILITOT 0.3  PROT 7.8  ALBUMIN 3.9    Recent Labs Lab 10/25/14 1311 10/25/14 1324  WBC 10.2  --   NEUTROABS 7.1  --   HGB 14.5 17.3*  HCT 44.8 51.0  MCV 75.0*  --   PLT 295  --    No results for input(s): CKTOTAL, CKMB, CKMBINDEX, TROPONINI in the last 168 hours.  Recent Labs  10/25/14 1311  LABPROT 13.0  INR 0.97   No results for  input(s): COLORURINE, LABSPEC, PHURINE, GLUCOSEU, HGBUR, BILIRUBINUR, KETONESUR, PROTEINUR, UROBILINOGEN, NITRITE, LEUKOCYTESUR in the last 72 hours.  Invalid input(s): APPERANCEUR     Component Value Date/Time   CHOL 138 10/26/2014 0240   TRIG 91 10/26/2014 0240   HDL 29* 10/26/2014 0240   CHOLHDL 4.8 10/26/2014 0240   VLDL 18 10/26/2014 0240   LDLCALC 91 10/26/2014 0240   Lab Results  Component Value Date   HGBA1C 7.7* 10/26/2014   No results found for: LABOPIA, COCAINSCRNUR, LABBENZ, AMPHETMU, THCU, LABBARB   Recent Labs Lab 10/25/14 1311  ETH <5    Ct Head Wo Contrast 10/25/2014    Small area of asymmetric RIGHT temporal lobe cortical hypodensity could represent early acute infarct.   MRI of the brain 12/26:  IMPRESSION: Prematurely truncated exam due to claustrophobia.  Multiple areas of acute right-sided cerebral infarction, RIGHT MCA Territory.  CT of the head 12/26: IMPRESSION: 1. No acute intracranial hemorrhage or mass effect. Subtle changes of right MCA territory ischemia corresponding to the diffusion MRI abnormality earlier today. 2. No new intracranial abnormality identified.   CTA head and neck - pending  PHYSICAL EXAM Physical exam: Exam: Gen: NAD Eyes: anicteric sclerae, moist conjunctivae  CV: no MRG, RRR Mental Status: Alert, follows commands, oriented, good historian  Neuro: Detailed Neurologic Exam  Speech:    No aphasia, no dysarthria  Cranial Nerves:    The pupils are equal, round, and reactive to light. EOMI. Fundi flat. Conjugate gaze, No gaze preference. Visual fields full. Mild right-sided NL flattening otherwise face symmetric, Tongue midline.   Motor Observation:    no involuntary movements noted. Tone appears normal. Good fine motor movements.      Strength:  5/5 strength throughout     Sensation:  Intact to LT  DTRs: Absent lowers, intact uppers  Plantars downgoing.   Gait: Normal native  gait, not shuffling, good arm swing.       ASSESSMENT/PLAN Mr. Jacob Avery is a 68 y.o. male with history of diabetes, hypertension, and hyperlipidemia  presenting with left facial droop and slurred speech. He did receive IV t-PA  90 mg at 1345 on 10/25/2014..   Stroke:  Non-dominant infarct secondary to small vessel disease.  Resultant: possible some mild NL flattening but dysarthria and fine motor difficulties have resolved  MRI - Patient refuses. Claustrophobic. Does not want Ativan or other sedation.  MRA - Patient refuses. Claustrophobic. Does not want Ativan or other sedation.  CTA head and neck - pending  Carotid Doppler - Bilateral 1-39% ICA stenosis. Vertebral artery flow is antegrade.  2D Echo - EF 55-60%. No cardiac source of emboli identified.  LDL 91  HgbA1c - 7.7  SCDs for VTE prophylaxis  Diet Carb Modified no liquids  aspirin 81 mg orally every day prior to admission, now on no antithrombotic secondary to TPA.  Ongoing aggressive stroke risk factor management  Therapy recommendations:  No follow-up PT or OT recommended.  Disposition:  Pending  Hypertension  Home meds: Norvasc 10 mg daily and Hyzaar 100/25 one daily.  Stable  Hyperlipidemia  Home meds: Lipitor 80 mg daily. Resumed.  LDL 91, goal < 70  Verify compliance  Continue statin at discharge  Diabetes  HgbA1c pending goal < 7.0  Controlled  Other Stroke Risk Factors  Advanced age  Obesity, Body mass index is 42.34 kg/(m^2).   Family hx stroke (  )   Other Active Problems  S/P TPA    Other Pertinent History  Personally examined patient and images, and have documentes history, physical, neuro exam,assessment and plan as stated above.   Sarina Ill, MD Stroke Neurology [phone] [facility]       To contact Stroke Continuity provider, please refer to [email or web address] After hours, contact General Neurology

## 2014-10-27 NOTE — Evaluation (Signed)
Physical Therapy Evaluation Patient Details Name: Jacob Avery MRN: [id number] DOB: 1946-09-28 Today's Date: 10/27/2014   History of Present Illness  67 yo M with likley small subcortical infarct. He responded well to tPA and had significant improvement.  Clinical Impression  Pt admitted with above diagnosis. He is mod I to supervision with all functional mobility.  He is reporting no further signs or symptoms and feels he has returned to his baseline.  No further PT intervention indicated.  No f/u services needed.  PT signing off.    Follow Up Recommendations No PT follow up    Equipment Recommendations  None recommended by PT    Recommendations for Other Services       Precautions / Restrictions Precautions Precautions: None      Mobility  Bed Mobility Overal bed mobility: Modified Independent                Transfers Overall transfer level: Modified independent Equipment used: None                Ambulation/Gait Ambulation/Gait assistance: Supervision Ambulation Distance (Feet): 250 Feet Assistive device: None Gait Pattern/deviations: WFL(Within Functional Limits) Gait velocity: WFL      Stairs            Wheelchair Mobility    Modified Rankin (Stroke Patients Only)       Balance Overall balance assessment: No apparent balance deficits (not formally assessed)                                           Pertinent Vitals/Pain Pain Assessment: No/denies pain    Home Living Family/patient expects to be discharged to:: Private residence Living Arrangements: Spouse/significant other Available Help at Discharge: Family;Available 24 hours/day Type of Home: House Home Access: Stairs to enter Entrance Stairs-Rails: Right;Left;Can reach both Entrance Stairs-Number of Steps: 2 Home Layout: Laundry or work area in basement;Able to live on main level with bedroom/bathroom Home Equipment: None      Prior Function Level of  Independence: Independent               Hand Dominance        Extremity/Trunk Assessment   Upper Extremity Assessment: Overall WFL for tasks assessed           Lower Extremity Assessment: Overall WFL for tasks assessed      Cervical / Trunk Assessment: Normal  Communication   Communication: No difficulties  Cognition Arousal/Alertness: Awake/alert Behavior During Therapy: WFL for tasks assessed/performed Overall Cognitive Status: Within Functional Limits for tasks assessed                      General Comments      Exercises        Assessment/Plan    PT Assessment Patent does not need any further PT services  PT Diagnosis Difficulty walking   PT Problem List    PT Treatment Interventions     PT Goals (Current goals can be found in the Care Plan section) Acute Rehab PT Goals Patient Stated Goal: home today PT Goal Formulation: All assessment and education complete, DC therapy    Frequency     Barriers to discharge        Co-evaluation               End of Session   Activity Tolerance: Patient tolerated  treatment well (mild SOB after ambulation. Pt reports that is his baseline.) Patient left: in chair;with call bell/phone within reach;with family/visitor present Nurse Communication: Mobility status         Time: 4580-9983 PT Time Calculation (min) (ACUTE ONLY): 14 min   Charges:   PT Evaluation $Initial PT Evaluation Tier I: 1 Procedure PT Treatments $Gait Training: 8-22 mins   PT G Codes:        Lorriane Shire 10/27/2014, 9:38 AM

## 2014-10-27 NOTE — Progress Notes (Signed)
Occupational Therapy Evaluation Patient Details Name: Jacob Avery MRN: [id number] DOB: May 15, 1946 Today's Date: 10/27/2014    History of Present Illness 68 yo M with likley small subcortical infarct. He responded well to tPA and had significant improvement.   Clinical Impression   PTA pt lived at home alone and was independent with ADLs. Pt currently at baseline and Independent with ADLs. LUE tested and WFL. All symptoms have resolved. Educated pt and wife on signs and symptoms of stroke as well as stress management techniques. Acute OT to sign off.     Follow Up Recommendations  No OT follow up    Equipment Recommendations  None recommended by OT    Recommendations for Other Services       Precautions / Restrictions Precautions Precautions: None      Mobility Bed Mobility Overal bed mobility: Independent                Transfers Overall transfer level: Independent Equipment used: None                  Balance Overall balance assessment: No apparent balance deficits (not formally assessed)                                          ADL Overall ADL's : Independent;At baseline                                       General ADL Comments: Tested LUE as pt reported decreased strength and coordination initially, however all symptoms appear to have resolved. Educated pt on signs and symptoms of stroke and importance of getting to the ER immediately.      Vision  Pt reports no change from baseline. No apparent deficits.                    Perception Perception Perception Tested?: No   Praxis Praxis Praxis tested?: Within functional limits    Pertinent Vitals/Pain Pain Assessment: No/denies pain     Hand Dominance Right   Extremity/Trunk Assessment Upper Extremity Assessment Upper Extremity Assessment: Overall WFL for tasks assessed   Lower Extremity Assessment Lower Extremity Assessment: Overall WFL for  tasks assessed   Cervical / Trunk Assessment Cervical / Trunk Assessment: Normal   Communication Communication Communication: No difficulties   Cognition Arousal/Alertness: Awake/alert Behavior During Therapy: WFL for tasks assessed/performed Overall Cognitive Status: Within Functional Limits for tasks assessed                                Home Living Family/patient expects to be discharged to:: Private residence Living Arrangements: Spouse/significant other Available Help at Discharge: Family;Available 24 hours/day Type of Home: House Home Access: Stairs to enter [employer] of Steps: 2 Entrance Stairs-Rails: Right;Left;Can reach both Home Layout: Laundry or work area in basement;Able to live on main level with bedroom/bathroom               Home Equipment: None          Prior Functioning/Environment Level of Independence: Independent             OT Diagnosis: Other (comment);Generalized weakness (disturbance of sensation)    End of Session  Activity Tolerance: Patient tolerated  treatment well Patient left: in chair;with call bell/phone within reach;with family/visitor present   Time: 1230-1245 OT Time Calculation (min): 15 min Charges:  OT General Charges $OT Visit: 1 Procedure OT Evaluation $Initial OT Evaluation Tier I: 1 Procedure G-Codes:    [name] 07-Nov-2014, 1:04 PM Cyndie Chime, OTR/L Occupational Therapist [phone] (pager)

## 2014-10-28 ENCOUNTER — Telehealth: Payer: Self-pay | Admitting: Neurology

## 2014-10-28 DIAGNOSIS — I10 Essential (primary) hypertension: Secondary | ICD-10-CM

## 2014-10-28 DIAGNOSIS — E119 Type 2 diabetes mellitus without complications: Secondary | ICD-10-CM

## 2014-10-28 DIAGNOSIS — E1159 Type 2 diabetes mellitus with other circulatory complications: Secondary | ICD-10-CM

## 2014-10-28 DIAGNOSIS — I6521 Occlusion and stenosis of right carotid artery: Secondary | ICD-10-CM

## 2014-10-28 DIAGNOSIS — G4733 Obstructive sleep apnea (adult) (pediatric): Secondary | ICD-10-CM | POA: Diagnosis present

## 2014-10-28 DIAGNOSIS — I6320 Cerebral infarction due to unspecified occlusion or stenosis of unspecified precerebral arteries: Secondary | ICD-10-CM

## 2014-10-28 DIAGNOSIS — E785 Hyperlipidemia, unspecified: Secondary | ICD-10-CM | POA: Diagnosis present

## 2014-10-28 DIAGNOSIS — I639 Cerebral infarction, unspecified: Secondary | ICD-10-CM

## 2014-10-28 LAB — GLUCOSE, CAPILLARY
Glucose-Capillary: 78 mg/dL (ref 70–99)
Glucose-Capillary: 175 mg/dL — ABNORMAL HIGH (ref 70–99)

## 2014-10-28 MED ORDER — CLOPIDOGREL BISULFATE 75 MG PO TABS: 75.0000 mg | ORAL_TABLET | Freq: Every day | ORAL | Status: DC

## 2014-10-28 MED ORDER — CLOPIDOGREL BISULFATE 75 MG PO TABS
75.0000 mg | ORAL_TABLET | Freq: Every day | ORAL | Status: DC
  Administered 2014-10-28: 75 mg via ORAL
  Filled 2014-10-28: qty 1

## 2014-10-28 MED ORDER — ASPIRIN EC 81 MG PO TBEC: 81.0000 mg | DELAYED_RELEASE_TABLET | Freq: Every day | ORAL | Status: DC

## 2014-10-28 NOTE — Telephone Encounter (Signed)
Patient daughter's Jacob Avery stated Rx clopidogrel (PLAVIX) 75 MG tablet needs to be forwarded to [facility] in [place] instead of [facility].  Please call and advise.

## 2014-10-28 NOTE — Progress Notes (Signed)
Pt transported out of unit by nurse tech per wheelchair with belongings to lobby. No acute distress noted.   Angeline Slim I 10/28/2014 3:36 PM

## 2014-10-28 NOTE — Discharge Summary (Signed)
Stroke Discharge Summary  Patient ID: Jacob Avery   MRN: [id number]      DOB: 1946/08/25  Date of Admission: 10/25/2014 Date of Discharge: 10/28/2014  Attending Physician:  Rosalin Hawking, MD, Stroke MD  Consulting Physician(s):   Treatment Team:  Md Stroke, MD   Patient's PCP:  No primary care provider on file.  DISCHARGE DIAGNOSIS:  Principal Problem:   Cerebral infarction due to occlusion or stenosis of precerebral artery -  R MCA infarcts secondary to large vessel atherosclerosis s/p IV tPA Active Problems:   Essential hypertension   Hyperlipidemia LDL goal <70   Morbid obesity   Diabetes   likely OSA (obstructive sleep apnea)  BMI: Body mass index is 42.34 kg/(m^2).  Past Medical History  Diagnosis Date  . Diabetes mellitus without complication   . Hypertension   . Hypercholesterolemia    History reviewed. No pertinent past surgical history.    Medication List    TAKE these medications        amLODipine 10 MG tablet  Commonly known as:  NORVASC  Take 10 mg by mouth daily.     aspirin 81 MG chewable tablet  Chew 81 mg by mouth daily.     atorvastatin 80 MG tablet  Commonly known as:  LIPITOR  Take 80 mg by mouth daily.     clopidogrel 75 MG tablet  Commonly known as:  PLAVIX  Take 1 tablet (75 mg total) by mouth daily.     insulin glargine 100 UNIT/ML injection  Commonly known as:  LANTUS  Inject 70 Units into the skin 2 (two) times daily.     insulin lispro 100 UNIT/ML injection  Commonly known as:  HUMALOG  Inject 25 Units into the skin 3 (three) times daily before meals.     LEVEMIR 100 UNIT/ML injection  Generic drug:  insulin detemir     losartan-hydrochlorothiazide 100-25 MG per tablet  Commonly known as:  HYZAAR  Take 1 tablet by mouth daily.     Sweet Oil Oil  2-3 drops by Does not apply route daily as needed (right ear soreness).        LABORATORY STUDIES CBC    Component Value Date/Time   WBC 10.2 10/25/2014 1311   RBC 5.97*  10/25/2014 1311   HGB 17.3* 10/25/2014 1324   HCT 51.0 10/25/2014 1324   PLT 295 10/25/2014 1311   MCV 75.0* 10/25/2014 1311   MCH 24.3* 10/25/2014 1311   MCHC 32.4 10/25/2014 1311   RDW 16.0* 10/25/2014 1311   LYMPHSABS 2.4 10/25/2014 1311   MONOABS 0.6 10/25/2014 1311   EOSABS 0.1 10/25/2014 1311   BASOSABS 0.0 10/25/2014 1311   CMP    Component Value Date/Time   NA 141 10/25/2014 1324   K 3.5 10/25/2014 1324   CL 100 10/25/2014 1324   CO2 27 10/25/2014 1311   GLUCOSE 74 10/25/2014 1324   BUN 17 10/25/2014 1324   CREATININE 0.90 10/25/2014 1324   CALCIUM 9.3 10/25/2014 1311   PROT 7.8 10/25/2014 1311   ALBUMIN 3.9 10/25/2014 1311   AST 17 10/25/2014 1311   ALT 16 10/25/2014 1311   ALKPHOS 114 10/25/2014 1311   BILITOT 0.3 10/25/2014 1311   GFRNONAA 75* 10/25/2014 1311   GFRAA 87* 10/25/2014 1311   COAGS Lab Results  Component Value Date   INR 0.97 10/25/2014   Lipid Panel    Component Value Date/Time   CHOL 138 10/26/2014 0240   TRIG 91  10/26/2014 0240   HDL 29* 10/26/2014 0240   CHOLHDL 4.8 10/26/2014 0240   VLDL 18 10/26/2014 0240   LDLCALC 91 10/26/2014 0240   HgbA1C  Lab Results  Component Value Date   HGBA1C 7.7* 10/26/2014   Cardiac Panel (last 3 results) No results for input(s): CKTOTAL, CKMB, TROPONINI, RELINDX in the last 72 hours. Urinalysis No results found for: COLORURINE, APPEARANCEUR, LABSPEC, PHURINE, GLUCOSEU, HGBUR, BILIRUBINUR, KETONESUR, PROTEINUR, UROBILINOGEN, NITRITE, LEUKOCYTESUR Urine Drug Screen No results found for: LABOPIA, COCAINSCRNUR, LABBENZ, AMPHETMU, THCU, LABBARB  Alcohol Level    Component Value Date/Time   ETH <5 10/25/2014 1311     SIGNIFICANT DIAGNOSTIC STUDIES Ct Head Wo Contrast 10/26/2014  1. No acute intracranial hemorrhage or mass effect. Subtle changes of right MCA territory ischemia corresponding to the diffusion MRI abnormality earlier today. 2. No new intracranial abnormality identified.   10/25/2014  Small area of asymmetric RIGHT temporal lobe cortical hypodensity could represent early acute infarct.   Mr Brain Wo Contrast 10/26/2014 Multiple areas of acute right-sided cerebral infarction, RIGHT MCA territory.   Ct Angio Head W/cm &/or Wo Cm 10/27/2014  1. Bulky soft plaque at the right carotid bifurcation and throughout the right ICA bulb where low-density plaque results in up to 50% stenosis. Incidental high-grade right ECA stenosis. 2. Left ICA bulb soft and calcified plaque with up to 50% stenosis. Tortuous cervical left ICA with a kinked appearance. 3. Bilateral vertebral artery origins/V1 segment stenosis. The right vertebral artery is dominant. Moderate stenosis of the distal left vertebral artery. 4. No right MCA major branch stenosis or occlusion. Mild irregularity of the posterior right MCA M2 branch. 5. Stable CT appearance of the brain, with subtle hypodense areas in the right MCA territory corresponding to the restricted diffusion on MRI. No mass effect, hemorrhage, or acute intracranial abnormality identified.   Carotid Doppler  There is 1-39% bilateral ICA stenosis. Vertebral artery flow is antegrade.  Moderate ECA stenosis.  2D Echocardiogram  EF 55-60% with no source of embolus.   LE Venous Dopplers negative     HISTORY OF PRESENT ILLNESS Jacob Avery is a 68 y.o. male with a history of diabetes, hypertension who presents with sudden onset left facial droop, slurred speech that started approximately noon today 10/25/2014 (LKW 1150 on 12/25). His family was over in a typical picture and he was normal at that time, then his daughter noticed that something was not right about him. He was then noticed that he was slurring his speech and therefore EMS was called out of concern for stroke. NIHSS 4. TPA was administered. Tpa was delayed due to family discussions (waiting for daughter and discussing). He was admitted for further evaluation and workup.     HOSPITAL COURSE Mr. Jacob Avery is a 68 y.o. male with history of diabetes, hypertension, and hyperlipidemia presenting with left facial droop and slurred speech. He did receive IV t-PA 90 mg at 1345 on 10/25/2014..   Stroke: Non-dominant R MCA infarcts secondary to large vessel atherosclerosis s/p IV tPA  He tolerated tPA without difficulty  Resultant: possible some mild NL flattening but dysarthria and fine motor difficulties have resolved  MRI - R MCA infarcts. Truncated due to Claustrophobia  MRA - Patient refuses. Does not want Ativan or other sedation.  CTA head and neck - R ICA up to 50% stenosis, L ICA 50%, no R MCA stenosis  LE venous dopplers negative  Carotid Doppler - Bilateral 1-39% ICA stenosis. Vertebral artery flow is antegrade.  2D Echo - EF 55-60%. No cardiac source of emboli identified.  Was on aspirin 81 mg orally every day prior to admission, increased to aspirin 325 mg orally every day initially. Given large vessel intracranial atherosclerosis, patient was changed to aspirin 81 mg and clopidogrel 75 mg orally every day x 3 months for secondary stroke prevention. After 3 months, change to plavix alone. Long-term dual antiplatelets are contraindicated due to risk for intracerebral hemorrhage. plavix called into [facility] in [place] (electronically sent to mail order, 30-day supply called in)  Ongoing aggressive stroke risk factor management  Therapy recommendations: No follow-up PT or OT recommended.  OP sleep study ordered to assess for obstructive sleep apnea  Outpatient telemetry monitoring requested to assess patient for atrial fibrillation as source of stroke with [facility] - cardiology. They will call for prior approval before scheduling.   Dr. Erlinda Hong discussed diagnosis, prognosis,  treatment options and plan of care in detail with patient, wife and daughter.    Disposition:  home  Hypertension  Home meds: Norvasc 10 mg daily and Hyzaar 100/25 one  daily.  Stable in hospital  Continue meds at discharge  Hyperlipidemia  Home meds: Lipitor 80 mg daily. Resumed.  LDL 91, goal < 70  Continue statin at discharge  Diabetes  HgbA1c 7.7 goal < 7.0  Elevated  Patient educated for weight loss and diabetes control  Other Stroke Risk Factors  Advanced age  Morbid Obesity, Body mass index is 42.34 kg/(m^2).   No Family hx of stroke   Likely obstructive sleep apnea. Wife reports snoring. Has not had sleep study. OP sleep study ordered to assess for obstructive sleep apnea w/ GNA.   DISCHARGE EXAM Blood pressure 161/76, pulse 53, temperature 98.2 F (36.8 C), temperature source Oral, resp. rate 18, height 5\' 9"  (1.753 m), weight 130.1 kg (286 lb 13.1 oz), SpO2 98 %. Exam: Gen: NAD Eyes: anicteric sclerae, moist conjunctivae  CV: no MRG, RRR Mental Status: Alert, follows commands, oriented, good historian Neuro:   Detailed Neurologic Exam Speech:  No aphasia, no dysarthria Cranial Nerves:  The pupils are equal, round, and reactive to light. EOMI. Fundi flat. Conjugate gaze, No gaze preference. Visual fields full. Mild right-sided NL flattening otherwise face symmetric, Tongue midline.  Motor Observation:  no involuntary movements noted. Tone appears normal. Good fine motor movements.  Strength:   5/5 strength throughout Sensation:    Intact to LT DTRs: Absent lowers, intact uppers Plantars downgoing.  Gait: Normal native gait, not shuffling, good arm swing.     Discharge Diet   Diet Carb Modified thin liquids  DISCHARGE PLAN  Disposition:  Home with family   aspirin 81 mg orally every day and clopidogrel 75 mg orally every day for secondary stroke prevention x 3 months then plavix alone.  Follow-up No primary care provider on file. in 2 weeks.  Follow-up with Dr. Rosalin Hawking in 2 months Stroke Clinic  35 minutes were spent preparing discharge.  [place]  [place] for Pager information 10/28/2014 2:10 PM    I, the attending vascular neurologist, have personally obtained a history, examined the patient, evaluated laboratory data, individually viewed imaging studies and agree with radiology interpretations. Together with the NP/PA, we formulated the assessment and plan of care which reflects our mutual decision.  I have made any additions or clarifications directly to the above note and agree with the findings and plan as currently documented.   Rosalin Hawking, MD PhD Stroke Neurology  10/28/2014 11:58 PM

## 2014-10-28 NOTE — Progress Notes (Signed)
Discharge orders given to pt.  Pt verbally acknowleged understanding. Pt enrolled in EMMI.   Pt had no questions when prompted. Nurse notified Ivin Booty ,NP with Dr. Erlinda Hong to call in Plavix to [facility] in [place].  Pt to be transported per car to home by family member.   Angeline Slim I 10/28/2014 3:35 PM

## 2014-10-28 NOTE — Assessment & Plan Note (Signed)
R MCA infarcts secondary to large vessel atherosclerosis s/p IV tPA

## 2014-10-28 NOTE — ED Provider Notes (Signed)
CSN: [id number]     Arrival date & time 10/25/14  1306 History   First MD Initiated Contact with Patient 10/25/14 1336     Chief Complaint  Patient presents with  . Code Stroke     (Consider location/radiation/quality/duration/timing/severity/associated sxs/prior Treatment) HPI Comments: LSN 11:50.  At noon, pt found by family to have facial droop, slurred speech weakness.   Patient is a 68 y.o. male presenting with Acute Neurological Problem.  Cerebrovascular Accident This is a new problem. The current episode started 1 to 2 hours ago. The problem occurs constantly. The problem has not changed since onset.Pertinent negatives include no chest pain, no abdominal pain, no headaches and no shortness of breath. Nothing aggravates the symptoms. Nothing relieves the symptoms. He has tried nothing for the symptoms. The treatment provided no relief.    Past Medical History  Diagnosis Date  . Diabetes mellitus without complication   . Hypertension   . Hypercholesterolemia    History reviewed. No pertinent past surgical history. History reviewed. No pertinent family history. History  Substance Use Topics  . Smoking status: Never Smoker   . Smokeless tobacco: Not on file  . Alcohol Use: No    Review of Systems  Constitutional: Negative for fever, activity change, appetite change and fatigue.  HENT: Negative for congestion, facial swelling, rhinorrhea and trouble swallowing.   Eyes: Negative for photophobia and pain.  Respiratory: Negative for cough, chest tightness and shortness of breath.   Cardiovascular: Negative for chest pain and leg swelling.  Gastrointestinal: Negative for nausea, vomiting, abdominal pain, diarrhea and constipation.  Endocrine: Negative for polydipsia and polyuria.  Genitourinary: Negative for dysuria, urgency, decreased urine volume and difficulty urinating.  Musculoskeletal: Negative for back pain and gait problem.  Skin: Negative for color change, rash and  wound.  Allergic/Immunologic: Negative for immunocompromised state.  Neurological: Positive for weakness. Negative for dizziness, facial asymmetry, speech difficulty, numbness and headaches.  Psychiatric/Behavioral: Negative for confusion, decreased concentration and agitation.      Allergies  Review of patient's allergies indicates not on file.  Home Medications   Prior to Admission medications   Medication Sig Start Date End Date Taking? Authorizing Provider  amLODipine (NORVASC) 10 MG tablet Take 10 mg by mouth daily. 10/16/14  Yes Historical Provider, MD  aspirin 81 MG chewable tablet Chew 81 mg by mouth daily.   Yes Historical Provider, MD  atorvastatin (LIPITOR) 80 MG tablet Take 80 mg by mouth daily. 10/16/14  Yes Historical Provider, MD  insulin glargine (LANTUS) 100 UNIT/ML injection Inject 70 Units into the skin 2 (two) times daily.   Yes Historical Provider, MD  insulin lispro (HUMALOG) 100 UNIT/ML injection Inject 25 Units into the skin 3 (three) times daily before meals.   Yes Historical Provider, MD  losartan-hydrochlorothiazide (HYZAAR) 100-25 MG per tablet Take 1 tablet by mouth daily. 08/17/14  Yes Historical Provider, MD  Olive Oil (SWEET OIL) OIL 2-3 drops by Does not apply route daily as needed (right ear soreness).   Yes Historical Provider, MD  clopidogrel (PLAVIX) 75 MG tablet Take 1 tablet (75 mg total) by mouth daily. 10/28/14   Donzetta Starch, NP  LEVEMIR 100 UNIT/ML injection  07/26/14   Historical Provider, MD   BP 161/76 mmHg  Pulse 53  Temp(Src) 98.2 F (36.8 C) (Oral)  Resp 18  Ht 5\' 9"  (1.753 m)  Wt 286 lb 13.1 oz (130.1 kg)  BMI 42.34 kg/m2  SpO2 98% Physical Exam  Constitutional: He is oriented  to person, place, and time. He appears well-developed and well-nourished. No distress.  HENT:  Head: Normocephalic and atraumatic.  Mouth/Throat: No oropharyngeal exudate.  Eyes: Pupils are equal, round, and reactive to light.  Neck: Normal range of  motion. Neck supple.  Cardiovascular: Normal rate, regular rhythm and normal heart sounds.  Exam reveals no gallop and no friction rub.   No murmur heard. Pulmonary/Chest: Effort normal and breath sounds normal. No respiratory distress. He has no wheezes. He has no rales.  Abdominal: Soft. Bowel sounds are normal. He exhibits no distension and no mass. There is no tenderness. There is no rebound and no guarding.  Musculoskeletal: Normal range of motion. He exhibits no edema or tenderness.  Neurological: He is alert and oriented to person, place, and time. A cranial nerve deficit is present. No sensory deficit. He exhibits abnormal muscle tone. GCS eye subscore is 4. GCS verbal subscore is 5. GCS motor subscore is 6.  L facial droop, LUE weakness  Skin: Skin is warm and dry.  Psychiatric: He has a normal mood and affect.    ED Course  Procedures (including critical care time) Labs Review Labs Reviewed  CBC - Abnormal; Notable for the following:    RBC 5.97 (*)    MCV 75.0 (*)    MCH 24.3 (*)    RDW 16.0 (*)    All other components within normal limits  COMPREHENSIVE METABOLIC PANEL - Abnormal; Notable for the following:    GFR calc non Af Amer 75 (*)    GFR calc Af Amer 87 (*)    All other components within normal limits  HEMOGLOBIN A1C - Abnormal; Notable for the following:    Hgb A1c MFr Bld 7.7 (*)    Mean Plasma Glucose 174 (*)    All other components within normal limits  LIPID PANEL - Abnormal; Notable for the following:    HDL 29 (*)    All other components within normal limits  GLUCOSE, CAPILLARY - Abnormal; Notable for the following:    Glucose-Capillary 67 (*)    All other components within normal limits  GLUCOSE, CAPILLARY - Abnormal; Notable for the following:    Glucose-Capillary 130 (*)    All other components within normal limits  GLUCOSE, CAPILLARY - Abnormal; Notable for the following:    Glucose-Capillary 121 (*)    All other components within normal limits   GLUCOSE, CAPILLARY - Abnormal; Notable for the following:    Glucose-Capillary 114 (*)    All other components within normal limits  GLUCOSE, CAPILLARY - Abnormal; Notable for the following:    Glucose-Capillary 151 (*)    All other components within normal limits  GLUCOSE, CAPILLARY - Abnormal; Notable for the following:    Glucose-Capillary 157 (*)    All other components within normal limits  GLUCOSE, CAPILLARY - Abnormal; Notable for the following:    Glucose-Capillary 173 (*)    All other components within normal limits  GLUCOSE, CAPILLARY - Abnormal; Notable for the following:    Glucose-Capillary 171 (*)    All other components within normal limits  GLUCOSE, CAPILLARY - Abnormal; Notable for the following:    Glucose-Capillary 147 (*)    All other components within normal limits  GLUCOSE, CAPILLARY - Abnormal; Notable for the following:    Glucose-Capillary 175 (*)    All other components within normal limits  I-STAT CHEM 8, ED - Abnormal; Notable for the following:    Calcium, Ion 1.00 (*)  Hemoglobin 17.3 (*)    All other components within normal limits  CBG MONITORING, ED - Abnormal; Notable for the following:    Glucose-Capillary 62 (*)    All other components within normal limits  CBG MONITORING, ED - Abnormal; Notable for the following:    Glucose-Capillary 138 (*)    All other components within normal limits  MRSA PCR SCREENING  ETHANOL  PROTIME-INR  APTT  DIFFERENTIAL  GLUCOSE, CAPILLARY  GLUCOSE, CAPILLARY  GLUCOSE, CAPILLARY  I-STAT TROPOININ, ED  I-STAT TROPOININ, ED    Imaging Review Ct Angio Head W/cm &/or Wo Cm  10/27/2014   CLINICAL DATA:  68 year old male status post tPA for code stroke, right MCA territory infarcts detected on MRI 10/26/2014. Initial encounter.  EXAM: CT ANGIOGRAPHY HEAD AND NECK  TECHNIQUE: Multidetector CT imaging of the head and neck was performed using the standard protocol during bolus administration of intravenous  contrast. Multiplanar CT image reconstructions and MIPs were obtained to evaluate the vascular anatomy. Carotid stenosis measurements (when applicable) are obtained utilizing NASCET criteria, using the distal internal carotid diameter as the denominator.  CONTRAST:  175mL OMNIPAQUE IOHEXOL 350 MG/ML SOLN  COMPARISON:  Post TPA head CT 10/26/2014, and earlier.  FINDINGS: CT HEAD  Brain: Subtle small areas of hypodensity re - identified in the right MCA territory corresponding 2 small areas of restricted diffusion on 10/26/2014. No associated mass effect or hemorrhage. Elsewhere stable gray white matter differentiation. No ventriculomegaly. No midline shift, mass effect, or evidence of intracranial mass lesion. No acute intracranial hemorrhage identified.  Calvarium and skull base: No acute findings.  Paranasal sinuses: Negative.  Orbits: Negative.  CTA NECK  Emphysema in the upper lobes. No superior mediastinal lymphadenopathy. Thyroid, larynx, pharynx, parapharyngeal spaces, retropharyngeal space, sublingual space, submandibular glands, and parotid glands are within normal limits. No acute osseous abnormality identified. No cervical lymphadenopathy.  Aortic arch: Bovine arch configuration. Moderate soft and calcified plaque in the arch.  Right carotid system: Mild proximal right CCA tortuosity. Bulky soft plaque at the right carotid bifurcation. High-grade right ECA stenosis incidentally noted. No right ICA origin stenosis, but soft plaque continues into the bulb, and there is a focus of low-density plaque at the posterior distal bulb (series 6, image 79) associated with stenosis of up to 50 % with respect to the distal vessel. Negative cervical right ICA otherwise.  Left carotid system: Mild motion artifact at the level of the thyroid affecting the left CCA, but it appears widely patent at this level. Mild calcified and moderate soft plaque at the left carotid bifurcation. Widely patent left ICA origin, but distal  bulb stenosis related to combined soft and calcified plaque up to 50 % with respect to the distal vessel. Beyond this level there is tortuosity of the vessel resulting in a kinked appearance (sagittal series 9, image 112), but otherwise no stenosis to the skullbase.  Vertebral arteries:  No proximal right subclavian artery stenosis. Calcified right vertebral artery origin and proximal V1 segment, with evidence of stenosis such that the V2 segment is diminutive, but patent. Detail of the proximal vessel also limited by quantum mottle artifact. The right vertebral artery remains patent to the skullbase.  No proximal left subclavian artery stenosis. Detail of the left vertebral artery origin also limited. Diminutive proximal V2 segment, appears more robust distally, an the vessel remains patent to the skullbase.  CTA HEAD  Anterior circulation:  Both ICA siphons are patent. Calcified plaque occurs bilaterally without hemodynamically significant stenosis. Ophthalmic  and posterior communicating artery origins are within normal limits.  Normal carotid termini. Normal MCA origins. The right ACA A1 is diminutive or absent. Anterior communicating artery is within normal limits. Tortuous proximal ACA branches, otherwise within normal limits. Left MCA branches are within normal limits. Right MCA M1 segment is within normal limits. Right MCA bifurcation is patent. No major right MCA branch stenosis or occlusion identified. Mild irregularity of the posterior most M2 branch (series 12, image 13).  Posterior circulation: Dominant distal left vertebral artery. Calcified plaque affecting the left V4 segment with moderate stenosis. Normal right PICA. Patent vertebrobasilar junction. Dominant appearing left AICA. No basilar artery stenosis. Fetal type bilateral PCA origins. SCA origins are patent. Bilateral PCA branches are within normal limits.  Venous sinuses: Negative, the right transverse and sigmoid sinuses are dominant.   Anatomic variants: Fetal type bilateral PCA origins. Hypoplastic or absent right A1 segment.  Delayed phase: No abnormal enhancement identified.  IMPRESSION: 1. Bulky soft plaque at the right carotid bifurcation and throughout the right ICA bulb where low-density plaque results in up to 50% stenosis. Incidental high-grade right ECA stenosis. 2. Left ICA bulb soft and calcified plaque with up to 50% stenosis. Tortuous cervical left ICA with a kinked appearance. 3. Bilateral vertebral artery origins/V1 segment stenosis. The right vertebral artery is dominant. Moderate stenosis of the distal left vertebral artery. 4. No right MCA major branch stenosis or occlusion. Mild irregularity of the posterior right MCA M2 branch. 5. Stable CT appearance of the brain, with subtle hypodense areas in the right MCA territory corresponding to the restricted diffusion on MRI. No mass effect, hemorrhage, or acute intracranial abnormality identified.   Electronically Signed   By: Lars Pinks M.D.   On: 10/27/2014 16:58   Ct Angio Neck W/cm &/or Wo/cm  10/27/2014   CLINICAL DATA:  69 year old male status post tPA for code stroke, right MCA territory infarcts detected on MRI 10/26/2014. Initial encounter.  EXAM: CT ANGIOGRAPHY HEAD AND NECK  TECHNIQUE: Multidetector CT imaging of the head and neck was performed using the standard protocol during bolus administration of intravenous contrast. Multiplanar CT image reconstructions and MIPs were obtained to evaluate the vascular anatomy. Carotid stenosis measurements (when applicable) are obtained utilizing NASCET criteria, using the distal internal carotid diameter as the denominator.  CONTRAST:  123mL OMNIPAQUE IOHEXOL 350 MG/ML SOLN  COMPARISON:  Post TPA head CT 10/26/2014, and earlier.  FINDINGS: CT HEAD  Brain: Subtle small areas of hypodensity re - identified in the right MCA territory corresponding 2 small areas of restricted diffusion on 10/26/2014. No associated mass effect or  hemorrhage. Elsewhere stable gray white matter differentiation. No ventriculomegaly. No midline shift, mass effect, or evidence of intracranial mass lesion. No acute intracranial hemorrhage identified.  Calvarium and skull base: No acute findings.  Paranasal sinuses: Negative.  Orbits: Negative.  CTA NECK  Emphysema in the upper lobes. No superior mediastinal lymphadenopathy. Thyroid, larynx, pharynx, parapharyngeal spaces, retropharyngeal space, sublingual space, submandibular glands, and parotid glands are within normal limits. No acute osseous abnormality identified. No cervical lymphadenopathy.  Aortic arch: Bovine arch configuration. Moderate soft and calcified plaque in the arch.  Right carotid system: Mild proximal right CCA tortuosity. Bulky soft plaque at the right carotid bifurcation. High-grade right ECA stenosis incidentally noted. No right ICA origin stenosis, but soft plaque continues into the bulb, and there is a focus of low-density plaque at the posterior distal bulb (series 6, image 79) associated with stenosis of up to 50 %  with respect to the distal vessel. Negative cervical right ICA otherwise.  Left carotid system: Mild motion artifact at the level of the thyroid affecting the left CCA, but it appears widely patent at this level. Mild calcified and moderate soft plaque at the left carotid bifurcation. Widely patent left ICA origin, but distal bulb stenosis related to combined soft and calcified plaque up to 50 % with respect to the distal vessel. Beyond this level there is tortuosity of the vessel resulting in a kinked appearance (sagittal series 9, image 112), but otherwise no stenosis to the skullbase.  Vertebral arteries:  No proximal right subclavian artery stenosis. Calcified right vertebral artery origin and proximal V1 segment, with evidence of stenosis such that the V2 segment is diminutive, but patent. Detail of the proximal vessel also limited by quantum mottle artifact. The right  vertebral artery remains patent to the skullbase.  No proximal left subclavian artery stenosis. Detail of the left vertebral artery origin also limited. Diminutive proximal V2 segment, appears more robust distally, an the vessel remains patent to the skullbase.  CTA HEAD  Anterior circulation:  Both ICA siphons are patent. Calcified plaque occurs bilaterally without hemodynamically significant stenosis. Ophthalmic and posterior communicating artery origins are within normal limits.  Normal carotid termini. Normal MCA origins. The right ACA A1 is diminutive or absent. Anterior communicating artery is within normal limits. Tortuous proximal ACA branches, otherwise within normal limits. Left MCA branches are within normal limits. Right MCA M1 segment is within normal limits. Right MCA bifurcation is patent. No major right MCA branch stenosis or occlusion identified. Mild irregularity of the posterior most M2 branch (series 12, image 13).  Posterior circulation: Dominant distal left vertebral artery. Calcified plaque affecting the left V4 segment with moderate stenosis. Normal right PICA. Patent vertebrobasilar junction. Dominant appearing left AICA. No basilar artery stenosis. Fetal type bilateral PCA origins. SCA origins are patent. Bilateral PCA branches are within normal limits.  Venous sinuses: Negative, the right transverse and sigmoid sinuses are dominant.  Anatomic variants: Fetal type bilateral PCA origins. Hypoplastic or absent right A1 segment.  Delayed phase: No abnormal enhancement identified.  IMPRESSION: 1. Bulky soft plaque at the right carotid bifurcation and throughout the right ICA bulb where low-density plaque results in up to 50% stenosis. Incidental high-grade right ECA stenosis. 2. Left ICA bulb soft and calcified plaque with up to 50% stenosis. Tortuous cervical left ICA with a kinked appearance. 3. Bilateral vertebral artery origins/V1 segment stenosis. The right vertebral artery is dominant.  Moderate stenosis of the distal left vertebral artery. 4. No right MCA major branch stenosis or occlusion. Mild irregularity of the posterior right MCA M2 branch. 5. Stable CT appearance of the brain, with subtle hypodense areas in the right MCA territory corresponding to the restricted diffusion on MRI. No mass effect, hemorrhage, or acute intracranial abnormality identified.   Electronically Signed   By: Lars Pinks M.D.   On: 10/27/2014 16:58     EKG Interpretation   Date/Time:  Friday October 25 2014 13:47:34 EST Ventricular Rate:  53 PR Interval:  169 QRS Duration: 94 QT Interval:  460 QTC Calculation: 432 R Axis:   10 Text Interpretation:  Sinus or ectopic atrial rhythm Borderline ST  elevation, lateral leads ED PHYSICIAN INTERPRETATION AVAILABLE IN [facility] Confirmed by TEST, Record ([id number]) on 10/27/2014 9:06:04 AM      CRITICAL CARE Performed by: Ernestina Patches, E Total critical care time: 20 Critical care time was exclusive of separately  billable procedures and treating other patients. Critical care was necessary to treat or prevent imminent or life-threatening deterioration. Critical care was time spent personally by me on the following activities: development of treatment plan with patient and/or surrogate as well as nursing, discussions with consultants, evaluation of patient's response to treatment, examination of patient, obtaining history from patient or surrogate, ordering and performing treatments and interventions, ordering and review of laboratory studies, ordering and review of radiographic studies, pulse oximetry and re-evaluation of patient's condition.  MDM   Final diagnoses:  Stroke    Pt is a 68 y.o. male with Pmhx as above who presents with sudden onset L sided facial droop, LUE weakness, slurred speech at noon today (LSN 11:50PM). On PE, pt protecting airway, BP elevated, but not >782 systolic. Cardiopulm exam benign. Neurology has determined pt to  the TPA candidate, TPA started in ED with no deterioration of condition.  Pt will be admitted to neuro ICU.      Ernestina Patches, MD 10/28/14 [phone]

## 2014-10-28 NOTE — Progress Notes (Signed)
VASCULAR LAB PRELIMINARY  PRELIMINARY  PRELIMINARY  PRELIMINARY  Bilateral lower extremity venous duplex completed.    Preliminary report:  Bilateral:  No evidence of DVT, superficial thrombosis, or Baker's Cyst.   Larone Kliethermes, RVS 10/28/2014, 12:05 PM

## 2014-10-28 NOTE — Progress Notes (Signed)
STROKE TEAM PROGRESS NOTE   HISTORY Jacob Avery is a 68 y.o. male with a history of diabetes, hypertension who presents with sudden onset left facial droop, slurred speech that started approximately noon today 10/25/2014 (LKW 1150 on 12/25). His family was over in a typical picture and he was normal at that time, then his daughter noticed that something was not right about him. He was then noticed that he was slurring his speech and therefore EMS was called out of concern for stroke. NIHSS 4. TPA was administered. tpa delayed due to family discussions(waiting for daughter and discussing). He was admitted for further evaluation and workup.   SUBJECTIVE (INTERVAL HISTORY) Wife at bedside. 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 He reports his slurred speech is much improved. Wife reports he gets SOB when walking. No heart racing. No weight loss. No abd pain. No bloody stools.   OBJECTIVE Temp:  [98 F (36.7 C)-98.3 F (36.8 C)] 98.2 F (36.8 C) (12/28 1007) Pulse Rate:  [55-67] 67 (12/28 1007) Cardiac Rhythm:  [-]  Resp:  [18-20] 18 (12/28 1007) BP: (142-159)/(67-79) 145/79 mmHg (12/28 1007) SpO2:  [94 %-97 %] 97 % (12/28 1007)   Recent Labs Lab 10/26/14 2113 10/27/14 0638 10/27/14 1118 10/27/14 1635 10/27/14 2154  GLUCAP 151* 157* 173* 171* 147*    Recent Labs Lab 10/25/14 1311 10/25/14 1324  NA 139 141  K 3.5 3.5  CL 99 100  CO2 27  --   GLUCOSE 70 74  BUN 14 17  CREATININE 1.00 0.90  CALCIUM 9.3  --     Recent Labs Lab 10/25/14 1311  AST 17  ALT 16  ALKPHOS 114  BILITOT 0.3  PROT 7.8  ALBUMIN 3.9    Recent Labs Lab 10/25/14 1311 10/25/14 1324  WBC 10.2  --   NEUTROABS 7.1  --   HGB 14.5 17.3*  HCT 44.8 51.0  MCV 75.0*  --   PLT 295  --    No results for input(s): CKTOTAL, CKMB, CKMBINDEX, TROPONINI in the last 168 hours.  Recent Labs  10/25/14 1311  LABPROT 13.0  INR 0.97   No results  for input(s): COLORURINE, LABSPEC, PHURINE, GLUCOSEU, HGBUR, BILIRUBINUR, KETONESUR, PROTEINUR, UROBILINOGEN, NITRITE, LEUKOCYTESUR in the last 72 hours.  Invalid input(s): APPERANCEUR     Component Value Date/Time   CHOL 138 10/26/2014 0240   TRIG 91 10/26/2014 0240   HDL 29* 10/26/2014 0240   CHOLHDL 4.8 10/26/2014 0240   VLDL 18 10/26/2014 0240   LDLCALC 91 10/26/2014 0240   Lab Results  Component Value Date   HGBA1C 7.7* 10/26/2014   No results found for: LABOPIA, COCAINSCRNUR, LABBENZ, AMPHETMU, THCU, LABBARB   Recent Labs Lab 10/25/14 1311  ETH <5    Ct Head Wo Contrast 10/26/2014    1. No acute intracranial hemorrhage or mass effect. Subtle changes of right MCA territory ischemia corresponding to the diffusion MRI abnormality earlier today. 2. No new intracranial abnormality identified.    10/25/2014    Small area of asymmetric RIGHT temporal lobe cortical hypodensity could represent early acute infarct.   Mr Brain Wo Contrast 10/26/2014 Multiple areas of acute right-sided cerebral infarction, RIGHT MCA territory.     Ct Angio Head W/cm &/or Wo Cm 10/27/2014    1. Bulky soft plaque at the right carotid bifurcation and throughout the right ICA bulb where low-density plaque results in up to 50% stenosis. Incidental high-grade right ECA  stenosis. 2. Left ICA bulb soft and calcified plaque with up to 50% stenosis. Tortuous cervical left ICA with a kinked appearance. 3. Bilateral vertebral artery origins/V1 segment stenosis. The right vertebral artery is dominant. Moderate stenosis of the distal left vertebral artery. 4. No right MCA major branch stenosis or occlusion. Mild irregularity of the posterior right MCA M2 branch. 5. Stable CT appearance of the brain, with subtle hypodense areas in the right MCA territory corresponding to the restricted diffusion on MRI. No mass effect, hemorrhage, or acute intracranial abnormality identified.      PHYSICAL EXAM  Exam: Gen:  NAD Eyes: anicteric sclerae, moist conjunctivae                    CV: no MRG, RRR Mental Status: Alert, follows commands, oriented, good historian  Neuro: Detailed Neurologic Exam  Speech:    No aphasia, no dysarthria  Cranial Nerves:    The pupils are equal, round, and reactive to light. EOMI. Fundi flat. Conjugate gaze, No gaze preference. Visual fields full. Mild right-sided NL flattening otherwise face symmetric, Tongue midline.   Motor Observation:    no involuntary movements noted. Tone appears normal. Good fine motor movements.     Strength:  5/5 strength throughout     Sensation:  Intact to LT  DTRs: Absent lowers, intact uppers  Plantars downgoing.   Gait: Normal native gait, not shuffling, good arm swing.       ASSESSMENT/PLAN Jacob Avery is a 68 y.o. male with history of diabetes, hypertension, and hyperlipidemia  presenting with left facial droop and slurred speech. He did receive IV t-PA  90 mg at 1345 on 10/25/2014..   Stroke:  Non-dominant R MCA infarcts, embolic pattern likely secondary to large vessel atherosclerosis due to soft plaque on the right MCA  Resultant: possible some mild NL flattening but dysarthria and fine motor difficulties have resolved  MRI - R MCA infarcts. Truncated due to Claustrophobic.    MRA - Patient refuses. Does not want Ativan or other sedation.  CTA head and neck - R ICA up to 50% stenosis, L ICA 50%, no R MCA stenosis. Right ICA large soft plaques.   LE venous dopplers negative  Carotid Doppler - Bilateral 1-39% ICA stenosis. Vertebral artery flow is antegrade.  2D Echo - EF 55-60%. No cardiac source of emboli identified.  LDL 91  HgbA1c - 7.7  SCDs for VTE prophylaxis  Diet Carb Modified no liquids  aspirin 81 mg orally every day prior to admission, now on aspirin 325 mg orally every day. Given large vessel intracranial atherosclerosis, patient will be treated with aspirin 81 mg and clopidogrel 75 mg  orally every day x 3 months for secondary stroke prevention. After 3 months, change to plavix alone. Long-term dual antiplatelets are contraindicated due to risk for intracerebral hemorrhage.  Ongoing aggressive stroke risk factor management  Therapy recommendations:  No follow-up PT or OT recommended.  OP sleep study to assess for obstructive sleep apnea  Will schedule outpatient 30 day cardiac event monitoring to assess patient for atrial fibrillation as source of stroke. Will arrange with patient's [facility] - cardiology  Disposition:  Return home   Hypertension  Home meds: Norvasc 10 mg daily and Hyzaar 100/25 one daily.  Stable  Hyperlipidemia  Home meds: Lipitor 80 mg daily. Resumed.  LDL 91, goal < 70  Taken daily  Continue statin at discharge  Diabetes  HgbA1c 7.7 goal < 7.0  Controlled  Further  control by follow up with PCP closely  Other Stroke Risk Factors  Advanced age  Morbid Obesity, Body mass index is 42.34 kg/(m^2).   No Family hx of stroke   Likely obstructive sleep apnea. Wife reports snoring. Has not had sleep study.  [place] for Pager information 10/28/2014 11:10 AM   I, the attending vascular neurologist, have personally obtained a history, examined the patient, evaluated laboratory data, individually viewed imaging studies and agree with radiology interpretations. Together with the NP/PA, we formulated the assessment and plan of care which reflects our mutual decision.  I have made any additions or clarifications directly to the above note and agree with the findings and plan as currently documented.   68 yo M with PMH of HTN, HLD, DM admitted for right MCA cortical small stroke. Etiology likely due to right ICA soft plaques, but cardioembolic not able to completely ruled out. Will do 30 day cardiac monitoring too. Also outpt sleep study.  Rosalin Hawking, MD PhD Stroke Neurology 10/29/2014 12:03  AM       To contact Stroke Continuity provider, please refer to [email or web address] After hours, contact General Neurology

## 2014-10-29 ENCOUNTER — Other Ambulatory Visit: Payer: Self-pay | Admitting: Neurology

## 2014-10-29 DIAGNOSIS — R002 Palpitations: Secondary | ICD-10-CM

## 2014-10-29 DIAGNOSIS — I6529 Occlusion and stenosis of unspecified carotid artery: Secondary | ICD-10-CM | POA: Insufficient documentation

## 2014-10-29 DIAGNOSIS — I6320 Cerebral infarction due to unspecified occlusion or stenosis of unspecified precerebral arteries: Secondary | ICD-10-CM

## 2014-10-29 LAB — GLUCOSE, CAPILLARY: Glucose-Capillary: 153 mg/dL — ABNORMAL HIGH (ref 70–99)

## 2014-10-29 NOTE — Telephone Encounter (Signed)
Called daughter and she said the issue has been fixed by our staff. Thanks.  Rosalin Hawking, MD PhD Stroke Neurology 10/29/2014 4:30 PM

## 2014-11-05 ENCOUNTER — Encounter (INDEPENDENT_AMBULATORY_CARE_PROVIDER_SITE_OTHER): Payer: Medicare Other

## 2014-11-05 ENCOUNTER — Encounter: Payer: Self-pay | Admitting: *Deleted

## 2014-11-05 DIAGNOSIS — R002 Palpitations: Secondary | ICD-10-CM

## 2014-11-05 DIAGNOSIS — I6359 Cerebral infarction due to unspecified occlusion or stenosis of other cerebral artery: Secondary | ICD-10-CM

## 2014-11-05 NOTE — Progress Notes (Signed)
Patient ID: Jacob Avery, male   DOB: 09-04-1946, 69 y.o.   MRN: [id number] [employer] 30 day cardiac event monitor applied to patient.

## 2014-11-06 DIAGNOSIS — M109 Gout, unspecified: Secondary | ICD-10-CM | POA: Diagnosis not present

## 2014-11-06 DIAGNOSIS — I10 Essential (primary) hypertension: Secondary | ICD-10-CM | POA: Diagnosis not present

## 2014-11-06 DIAGNOSIS — E785 Hyperlipidemia, unspecified: Secondary | ICD-10-CM | POA: Diagnosis not present

## 2014-11-06 DIAGNOSIS — Z125 Encounter for screening for malignant neoplasm of prostate: Secondary | ICD-10-CM | POA: Diagnosis not present

## 2014-11-06 DIAGNOSIS — E1129 Type 2 diabetes mellitus with other diabetic kidney complication: Secondary | ICD-10-CM | POA: Diagnosis not present

## 2014-11-13 DIAGNOSIS — I10 Essential (primary) hypertension: Secondary | ICD-10-CM | POA: Diagnosis not present

## 2014-11-13 DIAGNOSIS — R809 Proteinuria, unspecified: Secondary | ICD-10-CM | POA: Diagnosis not present

## 2014-11-13 DIAGNOSIS — Z0000 Encounter for general adult medical examination without abnormal findings: Secondary | ICD-10-CM | POA: Diagnosis not present

## 2014-11-13 DIAGNOSIS — I639 Cerebral infarction, unspecified: Secondary | ICD-10-CM | POA: Diagnosis not present

## 2014-11-13 DIAGNOSIS — Z6841 Body Mass Index (BMI) 40.0 and over, adult: Secondary | ICD-10-CM | POA: Diagnosis not present

## 2014-11-13 DIAGNOSIS — M109 Gout, unspecified: Secondary | ICD-10-CM | POA: Diagnosis not present

## 2014-11-15 DIAGNOSIS — Z1212 Encounter for screening for malignant neoplasm of rectum: Secondary | ICD-10-CM | POA: Diagnosis not present

## 2014-12-11 ENCOUNTER — Encounter: Payer: Self-pay | Admitting: Neurology

## 2014-12-11 ENCOUNTER — Ambulatory Visit (INDEPENDENT_AMBULATORY_CARE_PROVIDER_SITE_OTHER): Payer: Medicare Other | Admitting: Neurology

## 2014-12-11 VITALS — BP 157/80 | HR 55 | Resp 20 | Ht 69.75 in | Wt 279.2 lb

## 2014-12-11 DIAGNOSIS — R0683 Snoring: Secondary | ICD-10-CM | POA: Diagnosis not present

## 2014-12-11 DIAGNOSIS — G4730 Sleep apnea, unspecified: Secondary | ICD-10-CM

## 2014-12-11 DIAGNOSIS — I63131 Cerebral infarction due to embolism of right carotid artery: Secondary | ICD-10-CM

## 2014-12-11 NOTE — Patient Instructions (Signed)
Polysomnography (Sleep Studies) Polysomnography (PSG) is a series of tests used for detecting (diagnosing) obstructive sleep apnea and other sleep disorders. The tests measure how some parts of your body are working while you are sleeping. The tests are extensive and expensive. They are done in a sleep lab or hospital, and vary from center to center. Your caregiver may perform other more simple sleep studies and questionnaires before doing more complete and involved testing. Testing may not be covered by insurance. Some of these tests are:  An EEG (Electroencephalogram). This tests your brain waves and stages of sleep.  An EOG (Electrooculogram). This measures the movements of your eyes. It detects periods of REM (rapid eye movement) sleep, which is your dream sleep.  An EKG (Electrocardiogram). This measures your heart rhythm.  EMG (Electromyography). This is a measurement of how the muscles are working in your upper airway and your legs while sleeping.  An oximetry measurement. It measures how much oxygen (air) you are getting while sleeping.  Breathing efforts may be measured. The same test can be interpreted (understood) differently by different caregivers and centers that study sleep.  Studies may be given an apnea/hypopnea index (AHI). This is a number which is found by counting the times of no breathing or under breathing during the night, and relating those numbers to the amount of time spent in bed. When the AHI is greater than 15, the patient is likely to complain of daytime sleepiness. When the AHI is greater than 30, the patient is at increased risk for heart problems and must be followed more closely. Following the AHI also allows you to know how treatment is working. Simple oximetry (tracking the amount of oxygen that is taken in) can be used for screening patients who:  Do not have symptoms (problems) of OSA.  Have a normal Epworth Sleepiness Scale Score.  Have a low pre-test  probability of having OSA.  Have none of the upper airway problems likely to cause apnea.  Oximetry is also used to determine if treatment is effective in patients who showed significant desaturations (not getting enough oxygen) on their home sleep study. One extra measure of safety is to perform additional studies for the person who only snores. This is because no one can predict with absolute certainty who will have OSA. Those who show significant desaturations (not getting enough oxygen) are recommended to have a more detailed sleep study. Document Released: 04/24/2003 Document Revised: 01/10/2012 Document Reviewed: 12/24/2013 ExitCare Patient Information 2015 [employer]. This information is not intended to replace advice given to you by your health care provider. Make sure you discuss any questions you have with your health care provider.  

## 2014-12-11 NOTE — Progress Notes (Signed)
Rosalin Hawking, MD PhD Stroke Neurology 12/11/2014 9:10 AM   PCP Dr Osborne Casco.    SLEEP MEDICINE CLINIC   Provider:  Larey Seat, M D  Referring Provider: [name], MD Primary Care Physician:  [name], MD  Chief Complaint  Patient presents with  . NP internal ref. Biby,NP    Rm 10, wife    HPI:  Jacob Avery is a 69 y.o. male  Is seen here as a referral from Dr. Erlinda Hong after suffering a stroke in December 25th- 28th 2015. Dr Osborne Casco is his PCP. Jacob Avery was lucky enough to come to the hospital with an hour of onset of stroke symptoms and received TPA, which completely reversed his symptoms. Dr. Derryl Harbor now wants him to be evaluated by a sleep study because the patient has several other risk factors including insulin-dependent diabetes mellitus, hypertension and obesity. 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 The DOT form was from [facility].   The patient usually goes to the bedroom at 9 to 9:30 PM, he watches TV until about 10:30. His wife who shares the bedroom with him states that he falls asleep immediately once the TV is off and often already violet still running. She also reports that her husband is a snorer. Jacob Avery prefers to sleep on his side and states that he also wakes up still on his side. He has 2-3 nocturia breaks, he has woken up spontaneously at about 6.30. He feels refreshed, reports no headaches and no dry mouth.  Overnight sleep time is about 7.5 hours.  He takes naps in afternoon, 1 hour duration, feels refreshed after a nap. Usually about 2 PM to 3 PM.  His wife also agrees that he is gasping for air at time and has crescendo snoring.He has been morbidly obese for many years, over a decade. He was last slender at the time of wedding 47 years ago.      Review of Systems: Out of a complete 14 system review, the patient complains of only  the following symptoms, and all other reviewed systems are negative. Epworth 3 and FSS 19 points. Snoring, witnessed apnea, nocturia. Obesity. Diabetes.      History   Social History  . Marital Status: Married    Spouse Name: peggie  . Number of Children: 2  . Years of Education: hs gr   Occupational History  . retired    Social History Main Topics  . Smoking status: Former Smoker    Quit date: 11/01/2001  . Smokeless tobacco: Not on file  . Alcohol Use: No  . Drug Use: No  . Sexual Activity: Not on file   Other Topics Concern  . Not on file   Social History Narrative   Caffeine none.    No family history on file.  Past Medical History  Diagnosis Date  . Diabetes mellitus without complication   . Hypertension   . Hypercholesterolemia     No past surgical history on file.  Current Outpatient Prescriptions  Medication Sig Dispense Refill  . amLODipine (NORVASC) 10 MG tablet Take 10 mg by mouth daily.    Marland Kitchen atorvastatin (LIPITOR) 80 MG tablet Take 80 mg by mouth daily.    . clopidogrel (PLAVIX) 75 MG tablet Take 1 tablet (75 mg total) by mouth daily. 30 tablet 2  . insulin glargine (LANTUS) 100 UNIT/ML injection  Inject 70 Units into the skin 2 (two) times daily.    Marland Kitchen losartan-hydrochlorothiazide (HYZAAR) 100-25 MG per tablet Take 1 tablet by mouth daily.    Jonna Coup Oil (SWEET OIL) OIL 2-3 drops by Does not apply route daily as needed (right ear soreness).    Marland Kitchen ZETIA 10 MG tablet     . LEVEMIR 100 UNIT/ML injection      No current facility-administered medications for this visit.    Allergies as of 12/11/2014  . (No Known Allergies)    Vitals: BP 157/80 mmHg  Pulse 55  Resp 20  Ht 5' 9.75" (1.772 m)  Wt 279 lb 3.2 oz (126.644 kg)  BMI 40.33 kg/m2 Last Weight:  Wt Readings from Last 1 Encounters:  12/11/14 279 lb 3.2 oz (126.644 kg)       Last Height:   Ht Readings from Last 1 Encounters:  12/11/14 5' 9.75" (1.772 m)    Physical exam:  General:  The patient is awake, alert and appears not in acute distress. The patient is well groomed. Head: Normocephalic, atraumatic. Neck is supple. Mallampati 4, left uvula lower. ,  neck circumference:16 . Nasal airflow unrestricted , TMJ is  Not  evident . Retrognathia is seen.  Cardiovascular:  Regular rate and rhythm-  without  murmurs or carotid bruit, and without distended neck veins. Respiratory: Lungs are clear to auscultation. Skin:  Without evidence of edema, or rash Trunk: BMI is elevated and patient  has normal posture.  Neurologic exam : The patient is awake and alert, oriented to place and time.   Memory subjective  described as intact. There is a normal attention span & concentration ability.  Speech is fluent without  dysarthria, dysphonia or aphasia. Mood and affect are appropriate.  Cranial nerves: Pupils are equal and briskly reactive to light. Funduscopic exam without   evidence of pallor or edema.  Extraocular movements  in vertical and horizontal planes intact and without nystagmus. Visual fields by finger perimetry are intact. Hearing to finger rub intact.  Facial sensation intact to fine touch. Facial motor strength is symmetric and tongue and uvula move midline.  Motor exam:  Normal tone, muscle bulk and symmetric strength in all extremities.  Sensory:  Fine touch, pinprick and vibration were tested in all extremities.  Proprioception is tested in the upper extremities only. This was normal.  Coordination: Rapid alternating movements in the fingers/hands is normal.  Finger-to-nose maneuver  normal without evidence of ataxia, dysmetria or tremor.  Gait and station: Patient walks without assistive device and is able unassisted to climb up to the exam table.  Strength within normal limits. Stance is stable and normal. Tandem gait is unfragmented. Romberg testing is negative.  Deep tendon reflexes: in the  upper and lower extremities are symmetric and intact.  Babinski  maneuver response is downgoing.    Assessment:  After physical and neurologic examination, review of laboratory studies, imaging, neurophysiology testing and pre-existing records, assessment is   Jacob Avery is a patient with multiple risk factors for obstructive sleep apnea given his body mass index his narrow upper airway but also his comorbidities including the recent stroke, of which all symptoms were reversed by TPA. He is very lucky indeed not even to have neuropathy or retinopathy. He is morbidly obese, and has to change his diet .  He has no insomnia problems.   I'm also looking forward to perform a split-night polysomnography based on Vistaril observed and witnessed snoring and apnea.  I will score this at 4% and split at an AHI of 20.   The patient was advised of the nature of the diagnosed sleep disorder , the treatment options and risks for general a health and wellness arising from not treating the condition. Visit duration was 45 minutes.   Plan:  Treatment plan and additional workup : 50% of this face to face time was spent in discussion of stroke risk and secondary risk prevention in a patient with embolic stroke.  He has no history of atrial fibrillation.  After his stroke a heart monitor was initiated for one month which he has now completed. He has not been told of any arrhythmia. SPLIT PSG .      Asencion Partridge Wendi Lastra MD  12/11/2014

## 2014-12-12 ENCOUNTER — Telehealth: Payer: Self-pay | Admitting: *Deleted

## 2014-12-12 ENCOUNTER — Ambulatory Visit (INDEPENDENT_AMBULATORY_CARE_PROVIDER_SITE_OTHER): Payer: Medicare Other | Admitting: Neurology

## 2014-12-12 VITALS — BP 155/78 | HR 59 | Ht 69.0 in | Wt 279.0 lb

## 2014-12-12 DIAGNOSIS — G4730 Sleep apnea, unspecified: Secondary | ICD-10-CM

## 2014-12-12 DIAGNOSIS — I63131 Cerebral infarction due to embolism of right carotid artery: Secondary | ICD-10-CM

## 2014-12-12 DIAGNOSIS — R0683 Snoring: Secondary | ICD-10-CM

## 2014-12-12 DIAGNOSIS — Z0289 Encounter for other administrative examinations: Secondary | ICD-10-CM

## 2014-12-12 NOTE — Telephone Encounter (Signed)
See by Dr. Brett Fairy for sleep.  Form needs to be done by Stroke MD. (Dr. Erlinda Hong).

## 2014-12-12 NOTE — Sleep Study (Signed)
See attached document in Encounters tab 

## 2014-12-12 NOTE — Telephone Encounter (Signed)
[facility] fitness for duty, sent to [place] and Dr Dohmeier to be completed 12-12-14.

## 2014-12-13 NOTE — Telephone Encounter (Signed)
Patient has not been seen by Dr Erlinda Hong in the office but has an appointment in March, will hold form and complete once patient is established here at [facility]

## 2014-12-24 ENCOUNTER — Ambulatory Visit (INDEPENDENT_AMBULATORY_CARE_PROVIDER_SITE_OTHER): Payer: Medicare Other | Admitting: Neurology

## 2014-12-24 ENCOUNTER — Telehealth: Payer: Self-pay | Admitting: Neurology

## 2014-12-24 ENCOUNTER — Other Ambulatory Visit: Payer: Self-pay | Admitting: Neurology

## 2014-12-24 ENCOUNTER — Telehealth: Payer: Self-pay | Admitting: *Deleted

## 2014-12-24 DIAGNOSIS — G4733 Obstructive sleep apnea (adult) (pediatric): Secondary | ICD-10-CM | POA: Diagnosis not present

## 2014-12-24 NOTE — Telephone Encounter (Signed)
Called the patient and discussed the results from his recent sleep study.  He is aware of the finding of severe obstructive sleep apnea.  Pt was advised regarding his claustrophobia resulting in his inability to be treated on the night of his sleep study.  He is aware that Dr. Brett Fairy is recommending a desensitization with the use of nasal pillows.  The patient will then follow with a CPAP titration sleep study in an attempt to find an optimal treatment pressure for his severe osa.  The patient was in agreement will all and has scheduled his CPAP titration appt.  A copy of this sleep study report was sent to Dr. Rosalin Hawking and Burnetta Sabin, NP.  The patient will receive his sleep study report upon arrival for his CPAP titration appointment.

## 2014-12-24 NOTE — Telephone Encounter (Signed)
-----   Message from Liane Comber, [place] sent at 12/17/2014  2:26 PM EST -----   ----- Message -----    From: Rosalin Hawking, MD    Sent: 12/16/2014   8:40 PM      To: [facility] Triage Pool  Hello, could you please let the pt know that his cardiac event monitoring did not show afib. Continue the current treatment plan. Thanks.  Rosalin Hawking, MD PhD Stroke Neurology 12/16/2014 8:40 PM

## 2014-12-24 NOTE — Telephone Encounter (Signed)
Called patient on home phone, call was lost tried calling patient back, no answer, called patient on mobile line and left voice message that cardiac event monitor did not show any signs for A-fib, and to continue on current treatment plan.

## 2014-12-25 NOTE — Sleep Study (Signed)
See attached document in Encounters tab 

## 2015-01-01 DIAGNOSIS — I639 Cerebral infarction, unspecified: Secondary | ICD-10-CM | POA: Diagnosis not present

## 2015-01-01 DIAGNOSIS — E1129 Type 2 diabetes mellitus with other diabetic kidney complication: Secondary | ICD-10-CM | POA: Diagnosis not present

## 2015-01-01 DIAGNOSIS — Z6841 Body Mass Index (BMI) 40.0 and over, adult: Secondary | ICD-10-CM | POA: Diagnosis not present

## 2015-01-01 DIAGNOSIS — I10 Essential (primary) hypertension: Secondary | ICD-10-CM | POA: Diagnosis not present

## 2015-01-07 ENCOUNTER — Ambulatory Visit (INDEPENDENT_AMBULATORY_CARE_PROVIDER_SITE_OTHER): Payer: Medicare Other | Admitting: Neurology

## 2015-01-07 ENCOUNTER — Encounter: Payer: Self-pay | Admitting: Neurology

## 2015-01-07 VITALS — BP 152/83 | HR 57 | Ht 69.75 in | Wt 281.2 lb

## 2015-01-07 DIAGNOSIS — I63131 Cerebral infarction due to embolism of right carotid artery: Secondary | ICD-10-CM

## 2015-01-07 DIAGNOSIS — E1159 Type 2 diabetes mellitus with other circulatory complications: Secondary | ICD-10-CM

## 2015-01-07 DIAGNOSIS — G4730 Sleep apnea, unspecified: Secondary | ICD-10-CM | POA: Diagnosis not present

## 2015-01-07 DIAGNOSIS — E785 Hyperlipidemia, unspecified: Secondary | ICD-10-CM

## 2015-01-07 DIAGNOSIS — G4733 Obstructive sleep apnea (adult) (pediatric): Secondary | ICD-10-CM | POA: Insufficient documentation

## 2015-01-07 DIAGNOSIS — I10 Essential (primary) hypertension: Secondary | ICD-10-CM | POA: Diagnosis not present

## 2015-01-07 NOTE — Telephone Encounter (Signed)
[facility] fitness for Duty received,completed by Dr Erlinda Hong and Charisse March 01-07-15.

## 2015-01-07 NOTE — Progress Notes (Signed)
STROKE NEUROLOGY FOLLOW UP NOTE  NAME: Jacob Avery DOB: July 04, 1946  REASON FOR VISIT: stroke follow up HISTORY FROM: chart and pt  Today we had the pleasure of seeing [name] in follow-up at our [facility]rology [facility]. Pt was accompanied by wife.   History Summary Jacob Avery is a 69 y.o. male with a history of diabetes, hypertension who presents with sudden onset left facial droop, slurred speech on 10/25/2014. NIHSS 4. TPA was administered. He was admitted for further evaluation and workup. He has claustrophobia and MRI was truncated with only DWI images which showed punctate and patchy infarcts right MCA territory, consistent with emboli stroke. CTA showed right ICA 50% stenosis but significant soft plaque in bulb and calcification at supraclinoid ICA. etiology most likely large vessel athero, put on ASA and plavix dural antiplatelet. His symptoms resolved and discharged in good condition.   Interval History During the interval time, the patient has been doing well. No recurrent stroke. Had sleep study which showed OSA. He is following with Dr. Brett Fairy for OSA. He also had 30 day cardiac event monitoring which showed no afib. He is only on plavix now, not sure why not on ASA anymore. Pt had no complains and asking for going back to work. His BP today in clinic 152/83. He did not check BP at home. His glucose are OK as per pt. He has appointment with PCP on 02/14/15.    REVIEW OF SYSTEMS: Full 14 system review of systems performed and notable only for those listed below and in HPI above, all others are negative:  Constitutional:   Cardiovascular:  Ear/Nose/Throat:   Skin:  Eyes:   Respiratory:   Gastroitestinal:   Genitourinary:  Hematology/Lymphatic:   Endocrine:  Musculoskeletal:   Allergy/Immunology:   Neurological:   Psychiatric:  Sleep:   The following represents the patient's updated allergies and side effects list: No Known Allergies  The neurologically relevant  items on the patient's problem list were reviewed on today's visit.  Neurologic Examination  A problem focused neurological exam (12 or more points of the single system neurologic examination, vital signs counts as 1 point, cranial nerves count for 8 points) was performed.  Blood pressure 152/83, pulse 57, height 5' 9.75" (1.772 m), weight 281 lb 3.2 oz (127.551 kg).  General - Morbid obesity, well developed, in no apparent distress.  Ophthalmologic - Sharp disc margins OU.  Cardiovascular - Regular rate and rhythm with no murmur.  Mental Status -  Level of arousal and orientation to time, place, and person were intact. Language including expression, naming, repetition, comprehension, reading, and writing was assessed and found intact. Attention span and concentration were normal. Fund of Knowledge was assessed and was impaired and can not tell previous president.  Cranial Nerves II - XII - II - Visual field intact OU. III, IV, VI - Extraocular movements intact. V - Facial sensation intact bilaterally. VII - Facial movement intact bilaterally. VIII - Hearing & vestibular intact bilaterally. X - Palate elevates symmetrically. XI - Chin turning & shoulder shrug intact bilaterally. XII - Tongue protrusion intact.  Motor Strength - The patient's strength was normal in all extremities and pronator drift was absent.  Bulk was normal and fasciculations were absent.   Motor Tone - Muscle tone was assessed at the neck and appendages and was normal.  Reflexes - The patient's reflexes were normal in all extremities and he had no pathological reflexes.  Sensory - Light touch, temperature/pinprick, and Romberg testing were assessed  and were normal.    Coordination - The patient had normal movements in the hands and feet with no ataxia or dysmetria.  Tremor was absent.  Gait and Station - slow gait due to obesity.  Data reviewed: I personally reviewed the images and agree with the  radiology interpretations.  Ct Head Wo Contrast 10/26/2014  1. No acute intracranial hemorrhage or mass effect. Subtle changes of right MCA territory ischemia corresponding to the diffusion MRI abnormality earlier today. 2. No new intracranial abnormality identified.  10/25/2014  Small area of asymmetric RIGHT temporal lobe cortical hypodensity could represent early acute infarct.   Mr Brain Wo Contrast 10/26/2014 Multiple areas of acute right-sided cerebral infarction, RIGHT MCA territory.   Ct Angio Head W/cm &/or Wo Cm 10/27/2014  1. Bulky soft plaque at the right carotid bifurcation and throughout the right ICA bulb where low-density plaque results in up to 50% stenosis. Incidental high-grade right ECA stenosis. 2. Left ICA bulb soft and calcified plaque with up to 50% stenosis. Tortuous cervical left ICA with a kinked appearance. 3. Bilateral vertebral artery origins/V1 segment stenosis. The right vertebral artery is dominant. Moderate stenosis of the distal left vertebral artery. 4. No right MCA major branch stenosis or occlusion. Mild irregularity of the posterior right MCA M2 branch. 5. Stable CT appearance of the brain, with subtle hypodense areas in the right MCA territory corresponding to the restricted diffusion on MRI. No mass effect, hemorrhage, or acute intracranial abnormality identified.   Carotid Doppler There is 1-39% bilateral ICA stenosis. Vertebral artery flow is antegrade. Moderate ECA stenosis.  2D Echocardiogram EF 55-60% with no source of embolus.   LE Venous Dopplers negative  Component     Latest Ref Rng 10/26/2014  Cholesterol     0 - 200 mg/dL 138  Triglycerides     <150 mg/dL 91  HDL     >39 mg/dL 29 (L)  Total CHOL/HDL Ratio      4.8  VLDL     0 - 40 mg/dL 18  LDL (calc)     0 - 99 mg/dL 91  Hemoglobin A1C     <5.7 % 7.7 (H)  Mean Plasma Glucose     <117 mg/dL 174 (H)    Assessment: As you may recall, he is a 69 y.o. African  American male with PMH of DM, HTN who was admitted on 10/25/2014 for punctate and patchy infarcts right MCA territory, consistent with emboli stroke. CTA showed right ICA 50% stenosis but significant soft plaque in bulb and calcification at supraclinoid ICA. etiology most likely large vessel athero. 30 day cardiac event monitoring which showed no afib. Had sleep study which showed OSA. He is following with Dr. Brett Fairy for OSA.Was on ASA and plavix dural antiplatelet but currently only on plavix. His symptoms resolved and requesting to go back to work.    Plan:  - continue plavix and lipitor for stroke prevention - check BP and glucose at home and record and bring over to PCP - Follow up with your primary care physician for stroke risk factor modification. Recommend maintain blood pressure goal <130/80, diabetes with hemoglobin A1c goal below 6.5% and lipids with LDL cholesterol goal below 70 mg/dL.  - will do TCD emboli detection to evaluate for high risk right ICA recurrent stroke - OK to back to work, avoid heavy lifting or pushing - RTC in 6 months.  Orders Placed This Encounter  Procedures  . [place] TCD WITHMONITORING    Standing  Status: Future     Number of Occurrences:      Standing Expiration Date: 07/10/2015    Order Specific Question:  Reason for Exam (SYMPTOM  OR DIAGNOSIS REQUIRED)    Answer:  right ICA plaque    Order Specific Question:  Preferred imaging location?    Answer:  Internal    No orders of the defined types were placed in this encounter.    Patient Instructions  - continue plavix and lipitor for stroke prevention - check BP at home and record and bring to PCP for medication adjustment - check glucose at home - Follow up with your primary care physician for stroke risk factor modification. Recommend maintain blood pressure goal <130/80, diabetes with hemoglobin A1c goal below 6.5% and lipids with LDL cholesterol goal below 70 mg/dL.  - you are OK from stroke  standpoint to go back to work, avoid heavy lifting or pushing. Gradually increase the activity - TCD emboli detection to evaluate risk - follow up in 6 months.   Rosalin Hawking, MD PhD [facility] [address] [place], [place] [address] [phone]

## 2015-01-07 NOTE — Patient Instructions (Addendum)
-   continue plavix and lipitor for stroke prevention - check BP at home and record and bring to PCP for medication adjustment - check glucose at home - Follow up with your primary care physician for stroke risk factor modification. Recommend maintain blood pressure goal <130/80, diabetes with hemoglobin A1c goal below 6.5% and lipids with LDL cholesterol goal below 70 mg/dL.  - you are OK from stroke standpoint to go back to work, avoid heavy lifting or pushing. Gradually increase the activity - TCD emboli detection to evaluate risk - follow up in 6 months.

## 2015-01-09 ENCOUNTER — Telehealth: Payer: Self-pay | Admitting: *Deleted

## 2015-01-09 ENCOUNTER — Encounter: Payer: Self-pay | Admitting: *Deleted

## 2015-01-09 ENCOUNTER — Encounter: Payer: Self-pay | Admitting: Neurology

## 2015-01-09 ENCOUNTER — Other Ambulatory Visit: Payer: Self-pay | Admitting: Neurology

## 2015-01-09 DIAGNOSIS — G4733 Obstructive sleep apnea (adult) (pediatric): Secondary | ICD-10-CM

## 2015-01-09 NOTE — Telephone Encounter (Signed)
The patient's wife was contacted and provided the results of his overnight CPAP titration sleep study.  She was informed that he would be referred to [facility] for CPAP set up.  Dr. Rosalin Hawking was routed a copy of the report.   Patient instructed to contact our office 6-8 weeks post set up to schedule a follow up appointment.

## 2015-01-16 ENCOUNTER — Telehealth: Payer: Self-pay | Admitting: Radiology

## 2015-01-16 NOTE — Telephone Encounter (Signed)
01/16/15 Left v/ message to call regarding scheduling TCD emboli monitoring.

## 2015-02-14 DIAGNOSIS — F5221 Male erectile disorder: Secondary | ICD-10-CM | POA: Diagnosis not present

## 2015-02-14 DIAGNOSIS — G4733 Obstructive sleep apnea (adult) (pediatric): Secondary | ICD-10-CM | POA: Diagnosis not present

## 2015-02-14 DIAGNOSIS — E1129 Type 2 diabetes mellitus with other diabetic kidney complication: Secondary | ICD-10-CM | POA: Diagnosis not present

## 2015-02-14 DIAGNOSIS — E669 Obesity, unspecified: Secondary | ICD-10-CM | POA: Diagnosis not present

## 2015-02-14 DIAGNOSIS — I639 Cerebral infarction, unspecified: Secondary | ICD-10-CM | POA: Diagnosis not present

## 2015-02-14 DIAGNOSIS — R809 Proteinuria, unspecified: Secondary | ICD-10-CM | POA: Diagnosis not present

## 2015-02-14 DIAGNOSIS — Z6839 Body mass index (BMI) 39.0-39.9, adult: Secondary | ICD-10-CM | POA: Diagnosis not present

## 2015-02-14 DIAGNOSIS — E785 Hyperlipidemia, unspecified: Secondary | ICD-10-CM | POA: Diagnosis not present

## 2015-02-14 DIAGNOSIS — M109 Gout, unspecified: Secondary | ICD-10-CM | POA: Diagnosis not present

## 2015-02-14 DIAGNOSIS — I10 Essential (primary) hypertension: Secondary | ICD-10-CM | POA: Diagnosis not present

## 2015-02-18 ENCOUNTER — Other Ambulatory Visit: Payer: Medicare Other

## 2015-02-26 ENCOUNTER — Ambulatory Visit (INDEPENDENT_AMBULATORY_CARE_PROVIDER_SITE_OTHER): Payer: Medicare Other

## 2015-02-26 DIAGNOSIS — I63131 Cerebral infarction due to embolism of right carotid artery: Secondary | ICD-10-CM | POA: Diagnosis not present

## 2015-04-09 DIAGNOSIS — I10 Essential (primary) hypertension: Secondary | ICD-10-CM | POA: Diagnosis not present

## 2015-04-09 DIAGNOSIS — R809 Proteinuria, unspecified: Secondary | ICD-10-CM | POA: Diagnosis not present

## 2015-04-09 DIAGNOSIS — E1129 Type 2 diabetes mellitus with other diabetic kidney complication: Secondary | ICD-10-CM | POA: Diagnosis not present

## 2015-05-21 DIAGNOSIS — R809 Proteinuria, unspecified: Secondary | ICD-10-CM | POA: Diagnosis not present

## 2015-05-21 DIAGNOSIS — E1129 Type 2 diabetes mellitus with other diabetic kidney complication: Secondary | ICD-10-CM | POA: Diagnosis not present

## 2015-05-21 DIAGNOSIS — Z6839 Body mass index (BMI) 39.0-39.9, adult: Secondary | ICD-10-CM | POA: Diagnosis not present

## 2015-05-21 DIAGNOSIS — M109 Gout, unspecified: Secondary | ICD-10-CM | POA: Diagnosis not present

## 2015-05-21 DIAGNOSIS — F5221 Male erectile disorder: Secondary | ICD-10-CM | POA: Diagnosis not present

## 2015-05-21 DIAGNOSIS — Z1389 Encounter for screening for other disorder: Secondary | ICD-10-CM | POA: Diagnosis not present

## 2015-05-21 DIAGNOSIS — E785 Hyperlipidemia, unspecified: Secondary | ICD-10-CM | POA: Diagnosis not present

## 2015-05-21 DIAGNOSIS — E669 Obesity, unspecified: Secondary | ICD-10-CM | POA: Diagnosis not present

## 2015-05-21 DIAGNOSIS — I10 Essential (primary) hypertension: Secondary | ICD-10-CM | POA: Diagnosis not present

## 2015-05-21 DIAGNOSIS — G4733 Obstructive sleep apnea (adult) (pediatric): Secondary | ICD-10-CM | POA: Diagnosis not present

## 2015-05-21 DIAGNOSIS — I639 Cerebral infarction, unspecified: Secondary | ICD-10-CM | POA: Diagnosis not present

## 2015-05-28 DIAGNOSIS — H52223 Regular astigmatism, bilateral: Secondary | ICD-10-CM | POA: Diagnosis not present

## 2015-05-28 DIAGNOSIS — H524 Presbyopia: Secondary | ICD-10-CM | POA: Diagnosis not present

## 2015-05-28 DIAGNOSIS — E119 Type 2 diabetes mellitus without complications: Secondary | ICD-10-CM | POA: Diagnosis not present

## 2015-05-28 DIAGNOSIS — H5203 Hypermetropia, bilateral: Secondary | ICD-10-CM | POA: Diagnosis not present

## 2015-07-02 DIAGNOSIS — Z6839 Body mass index (BMI) 39.0-39.9, adult: Secondary | ICD-10-CM | POA: Diagnosis not present

## 2015-07-02 DIAGNOSIS — I10 Essential (primary) hypertension: Secondary | ICD-10-CM | POA: Diagnosis not present

## 2015-07-02 DIAGNOSIS — E1129 Type 2 diabetes mellitus with other diabetic kidney complication: Secondary | ICD-10-CM | POA: Diagnosis not present

## 2015-07-02 DIAGNOSIS — R809 Proteinuria, unspecified: Secondary | ICD-10-CM | POA: Diagnosis not present

## 2015-08-19 DIAGNOSIS — Z794 Long term (current) use of insulin: Secondary | ICD-10-CM | POA: Insufficient documentation

## 2015-08-19 DIAGNOSIS — I6990 Unspecified sequelae of unspecified cerebrovascular disease: Secondary | ICD-10-CM | POA: Insufficient documentation

## 2015-09-10 DIAGNOSIS — Z6839 Body mass index (BMI) 39.0-39.9, adult: Secondary | ICD-10-CM | POA: Diagnosis not present

## 2015-09-10 DIAGNOSIS — I639 Cerebral infarction, unspecified: Secondary | ICD-10-CM | POA: Diagnosis not present

## 2015-09-10 DIAGNOSIS — I69998 Other sequelae following unspecified cerebrovascular disease: Secondary | ICD-10-CM | POA: Diagnosis not present

## 2015-09-10 DIAGNOSIS — R809 Proteinuria, unspecified: Secondary | ICD-10-CM | POA: Diagnosis not present

## 2015-09-10 DIAGNOSIS — E1129 Type 2 diabetes mellitus with other diabetic kidney complication: Secondary | ICD-10-CM | POA: Diagnosis not present

## 2015-09-10 DIAGNOSIS — E7800 Pure hypercholesterolemia, unspecified: Secondary | ICD-10-CM | POA: Diagnosis not present

## 2015-09-10 DIAGNOSIS — M109 Gout, unspecified: Secondary | ICD-10-CM | POA: Diagnosis not present

## 2015-09-10 DIAGNOSIS — Z794 Long term (current) use of insulin: Secondary | ICD-10-CM | POA: Diagnosis not present

## 2015-09-10 DIAGNOSIS — I10 Essential (primary) hypertension: Secondary | ICD-10-CM | POA: Diagnosis not present

## 2015-09-10 DIAGNOSIS — Z23 Encounter for immunization: Secondary | ICD-10-CM | POA: Diagnosis not present

## 2015-09-10 DIAGNOSIS — G4733 Obstructive sleep apnea (adult) (pediatric): Secondary | ICD-10-CM | POA: Diagnosis not present

## 2015-10-01 DIAGNOSIS — I10 Essential (primary) hypertension: Secondary | ICD-10-CM | POA: Diagnosis not present

## 2015-10-01 DIAGNOSIS — E1129 Type 2 diabetes mellitus with other diabetic kidney complication: Secondary | ICD-10-CM | POA: Diagnosis not present

## 2015-10-01 DIAGNOSIS — Z6841 Body Mass Index (BMI) 40.0 and over, adult: Secondary | ICD-10-CM | POA: Diagnosis not present

## 2015-11-19 DIAGNOSIS — M109 Gout, unspecified: Secondary | ICD-10-CM | POA: Diagnosis not present

## 2015-11-19 DIAGNOSIS — E1129 Type 2 diabetes mellitus with other diabetic kidney complication: Secondary | ICD-10-CM | POA: Diagnosis not present

## 2015-11-19 DIAGNOSIS — I10 Essential (primary) hypertension: Secondary | ICD-10-CM | POA: Diagnosis not present

## 2015-11-26 DIAGNOSIS — E1129 Type 2 diabetes mellitus with other diabetic kidney complication: Secondary | ICD-10-CM | POA: Diagnosis not present

## 2015-11-26 DIAGNOSIS — Z0000 Encounter for general adult medical examination without abnormal findings: Secondary | ICD-10-CM | POA: Diagnosis not present

## 2015-11-26 DIAGNOSIS — G4733 Obstructive sleep apnea (adult) (pediatric): Secondary | ICD-10-CM | POA: Diagnosis not present

## 2015-11-26 DIAGNOSIS — R808 Other proteinuria: Secondary | ICD-10-CM | POA: Diagnosis not present

## 2015-11-26 DIAGNOSIS — Z6839 Body mass index (BMI) 39.0-39.9, adult: Secondary | ICD-10-CM | POA: Diagnosis not present

## 2015-11-26 DIAGNOSIS — Z794 Long term (current) use of insulin: Secondary | ICD-10-CM | POA: Diagnosis not present

## 2015-11-26 DIAGNOSIS — I639 Cerebral infarction, unspecified: Secondary | ICD-10-CM | POA: Diagnosis not present

## 2015-11-26 DIAGNOSIS — Z1389 Encounter for screening for other disorder: Secondary | ICD-10-CM | POA: Diagnosis not present

## 2015-11-26 DIAGNOSIS — M109 Gout, unspecified: Secondary | ICD-10-CM | POA: Diagnosis not present

## 2015-11-26 DIAGNOSIS — I69998 Other sequelae following unspecified cerebrovascular disease: Secondary | ICD-10-CM | POA: Diagnosis not present

## 2015-11-26 DIAGNOSIS — E7800 Pure hypercholesterolemia, unspecified: Secondary | ICD-10-CM | POA: Diagnosis not present

## 2015-11-26 DIAGNOSIS — F5221 Male erectile disorder: Secondary | ICD-10-CM | POA: Diagnosis not present

## 2015-11-28 DIAGNOSIS — Z1212 Encounter for screening for malignant neoplasm of rectum: Secondary | ICD-10-CM | POA: Diagnosis not present

## 2016-01-07 DIAGNOSIS — E1129 Type 2 diabetes mellitus with other diabetic kidney complication: Secondary | ICD-10-CM | POA: Diagnosis not present

## 2016-01-07 DIAGNOSIS — R809 Proteinuria, unspecified: Secondary | ICD-10-CM | POA: Diagnosis not present

## 2016-01-07 DIAGNOSIS — I10 Essential (primary) hypertension: Secondary | ICD-10-CM | POA: Diagnosis not present

## 2016-01-07 DIAGNOSIS — Z6841 Body Mass Index (BMI) 40.0 and over, adult: Secondary | ICD-10-CM | POA: Diagnosis not present

## 2016-01-07 DIAGNOSIS — G4733 Obstructive sleep apnea (adult) (pediatric): Secondary | ICD-10-CM | POA: Diagnosis not present

## 2016-03-03 DIAGNOSIS — I10 Essential (primary) hypertension: Secondary | ICD-10-CM | POA: Diagnosis not present

## 2016-03-03 DIAGNOSIS — G4733 Obstructive sleep apnea (adult) (pediatric): Secondary | ICD-10-CM | POA: Diagnosis not present

## 2016-03-03 DIAGNOSIS — Z1389 Encounter for screening for other disorder: Secondary | ICD-10-CM | POA: Diagnosis not present

## 2016-03-03 DIAGNOSIS — E7800 Pure hypercholesterolemia, unspecified: Secondary | ICD-10-CM | POA: Diagnosis not present

## 2016-03-03 DIAGNOSIS — Z794 Long term (current) use of insulin: Secondary | ICD-10-CM | POA: Diagnosis not present

## 2016-03-03 DIAGNOSIS — I638 Other cerebral infarction: Secondary | ICD-10-CM | POA: Diagnosis not present

## 2016-03-03 DIAGNOSIS — R808 Other proteinuria: Secondary | ICD-10-CM | POA: Diagnosis not present

## 2016-03-03 DIAGNOSIS — I69998 Other sequelae following unspecified cerebrovascular disease: Secondary | ICD-10-CM | POA: Diagnosis not present

## 2016-03-03 DIAGNOSIS — Z6841 Body Mass Index (BMI) 40.0 and over, adult: Secondary | ICD-10-CM | POA: Diagnosis not present

## 2016-03-03 DIAGNOSIS — M109 Gout, unspecified: Secondary | ICD-10-CM | POA: Diagnosis not present

## 2016-03-03 DIAGNOSIS — E1129 Type 2 diabetes mellitus with other diabetic kidney complication: Secondary | ICD-10-CM | POA: Diagnosis not present

## 2016-03-30 IMAGING — CT CT HEAD W/O CM
1 series · 16 of 29 positions shown, 20 images · non-contrast
Comparison: None.

CLINICAL DATA: Code stroke. Left-sided facial droop. Slurred
speech.

EXAM:
CT HEAD WITHOUT CONTRAST
TECHNIQUE: Contiguous axial images were obtained from the base of the skull
through the vertex without intravenous contrast.

[Series 2: head 5.0 h30s · axial · 0.42mm/px · z∈[-115,+15]mm · 16 of 29 slices shown, 20 images]
[im 2/29  brain]
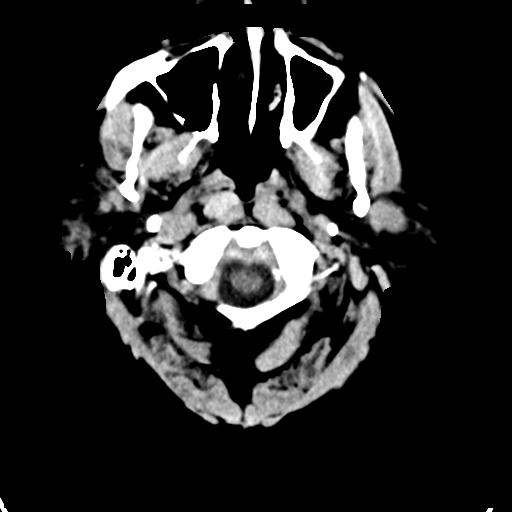
[im 2/29  bone]
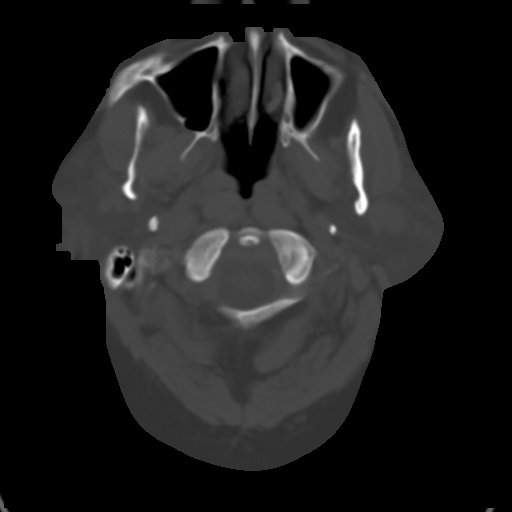
[im 4/29  brain]
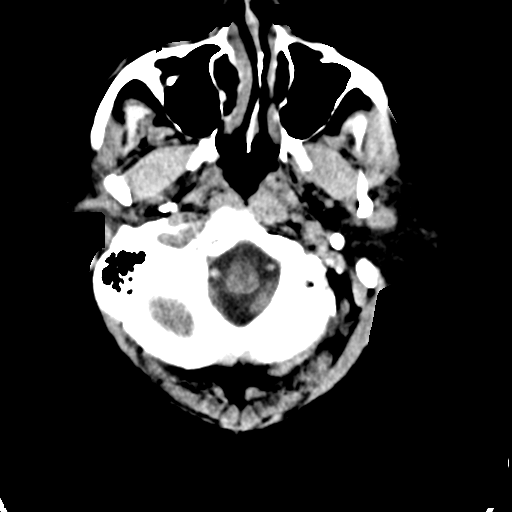
[im 6/29  brain]
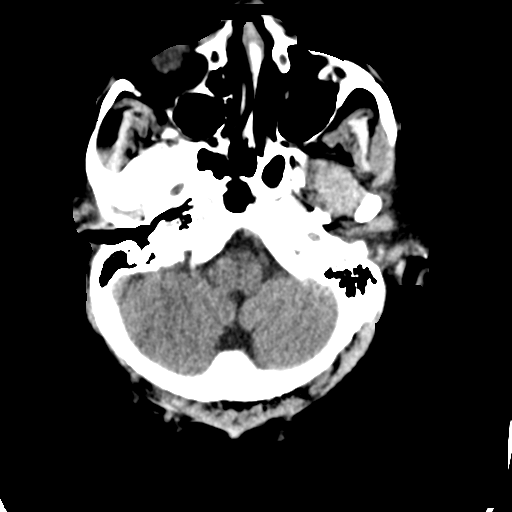
[im 7/29  brain]
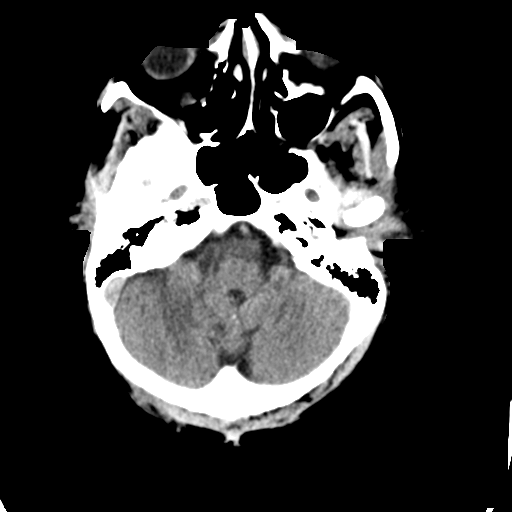
[im 9/29  brain]
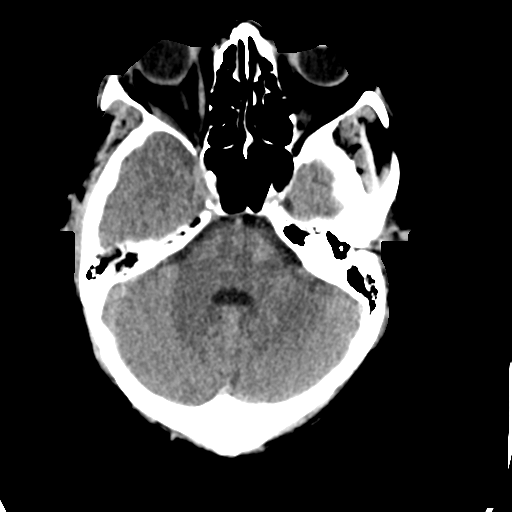
[im 9/29  bone]
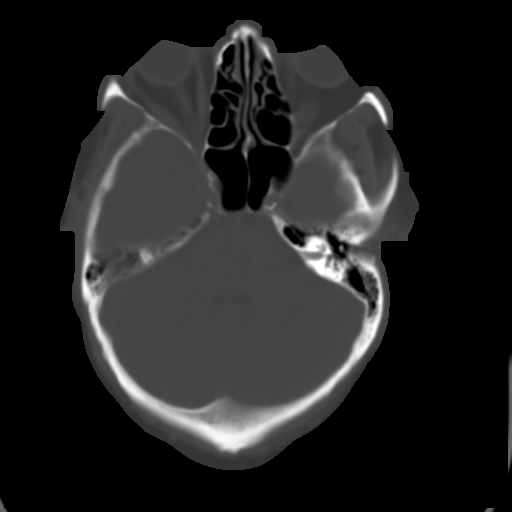
[im 11/29  brain]
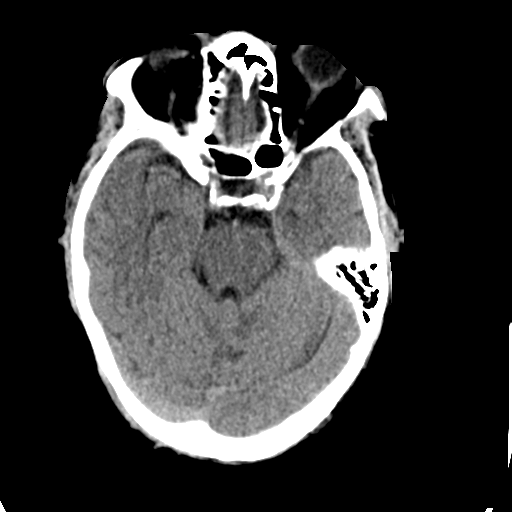
[im 12/29  brain]
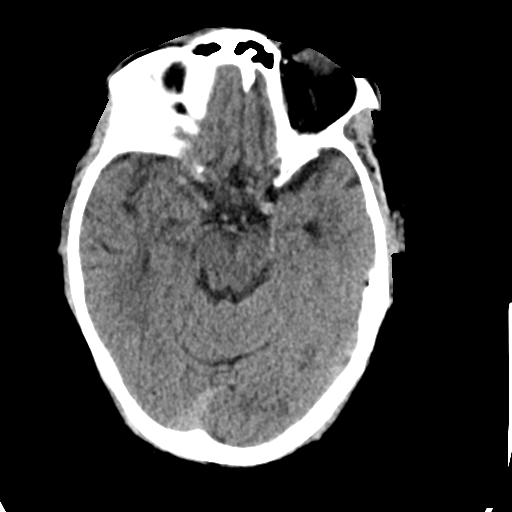
[im 14/29  brain]
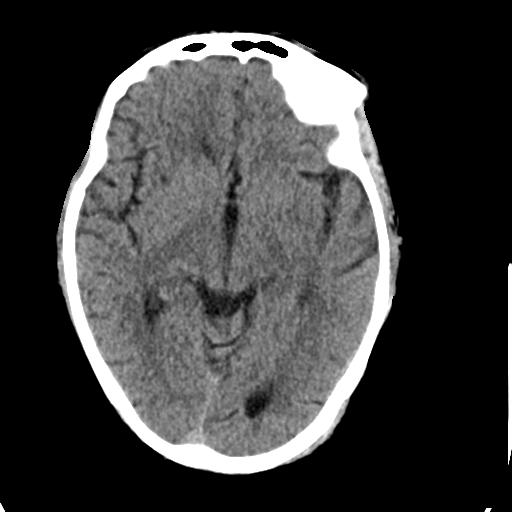
[im 16/29  brain]
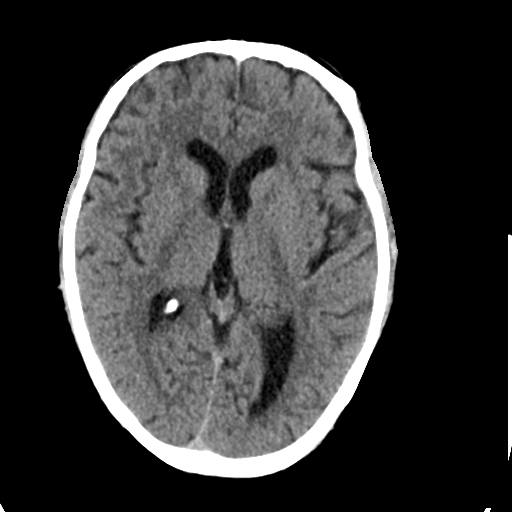
[im 16/29  bone]
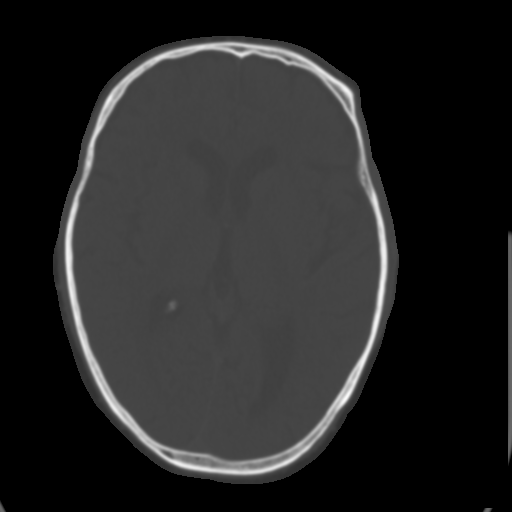
[im 18/29  brain]
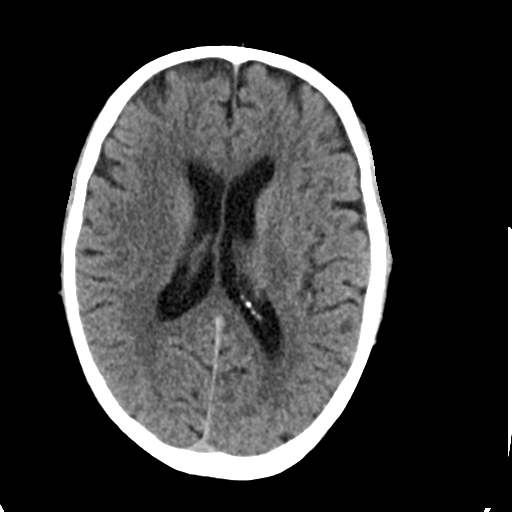
[im 19/29  brain]
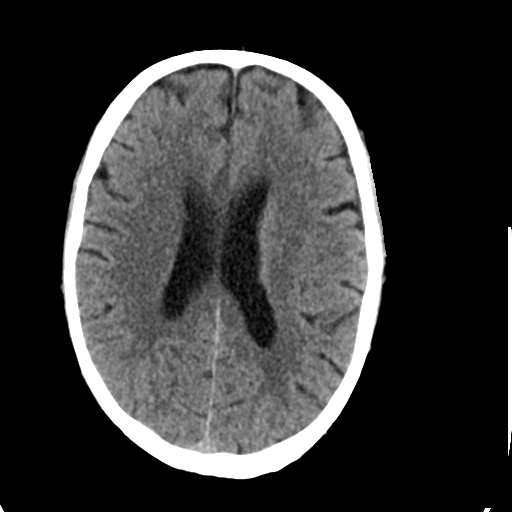
[im 21/29  brain]
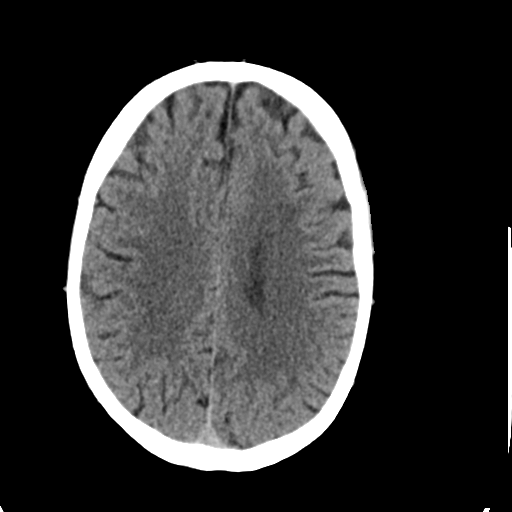
[im 23/29  brain]
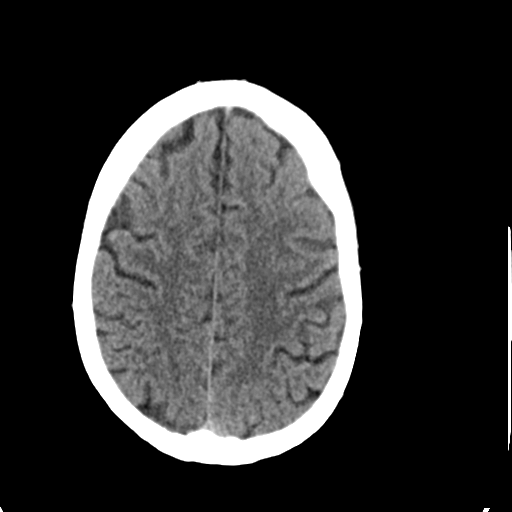
[im 23/29  bone]
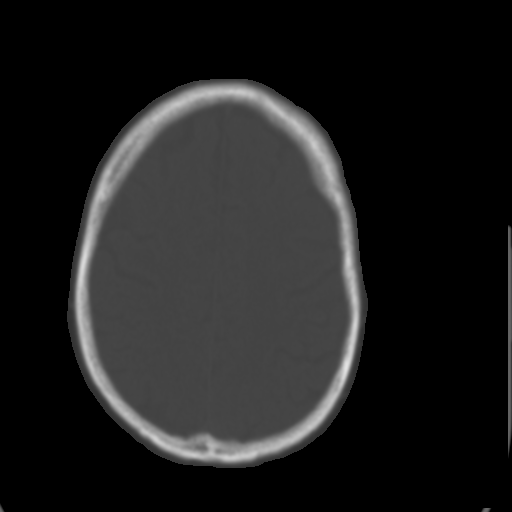
[im 24/29  brain]
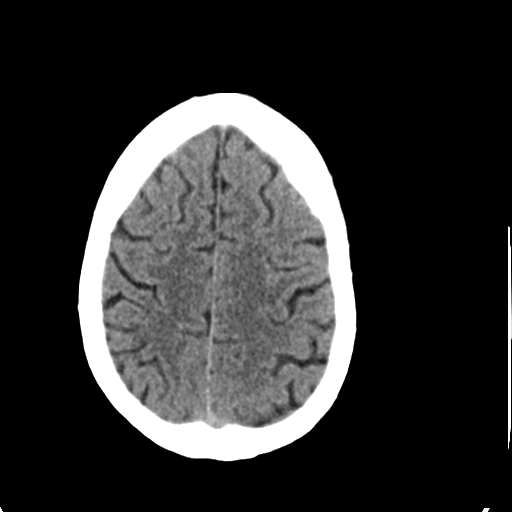
[im 26/29  brain]
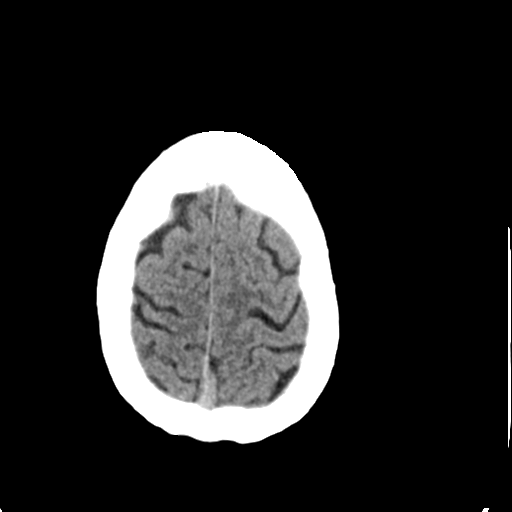
[im 28/29  brain]
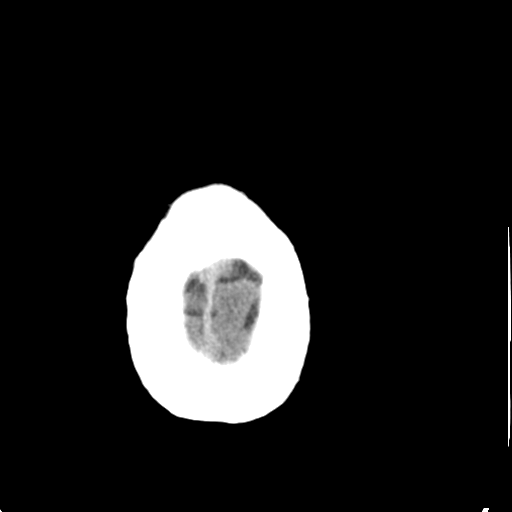

[16 of 29 positions shown; findings below may reference images not displayed]

FINDINGS: Small area of asymmetric cortical hypodensity RIGHT temporal lobe as
seen on image 13 could represent early acute infarction versus
partial volume averaging. No CT signs of proximal vascular
thrombosis. No other areas suspicious for cortical infarct.

No hemorrhage, mass lesion, hydrocephalus, or extra-axial fluid.
Calvarium intact. No sinus or mastoid disease. Vascular
calcification. Previous right-sided nasal bone fracture.
IMPRESSION: Small area of asymmetric RIGHT temporal lobe cortical hypodensity
could represent early acute infarct.

Critical Value/emergent results were called by telephone at the time
of interpretation on 10/25/2014 at [DATE] to Dr. FUERTE , who
verbally acknowledged these results.

## 2016-03-31 IMAGING — MR MR HEAD W/O CM
3 series · 46 of 48 positions shown · non-contrast
Comparison: CT head 10/25/2014.

CLINICAL DATA: Sudden onset of LEFT facial droop. This began
10/25/2014. Initial encounter.

EXAM:
MRI HEAD WITHOUT CONTRAST
TECHNIQUE: Multiplanar, multiecho pulse sequences of the brain and surrounding
structures were obtained without intravenous contrast.

[Series 3: FLAIR · sagittal · 5.0mm · 0.47mm/px · 8 of 25 slices shown]
[im 1/25]
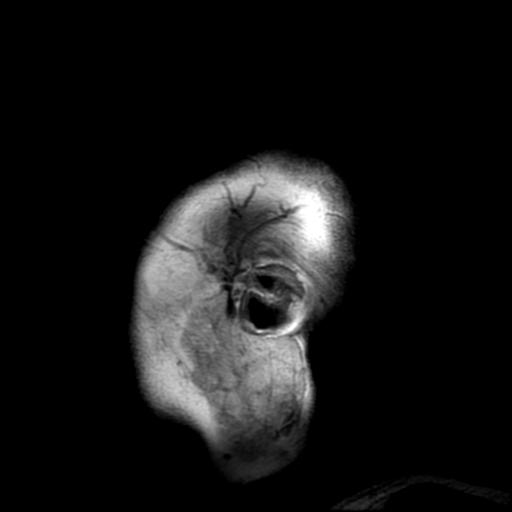
[im 4/25]
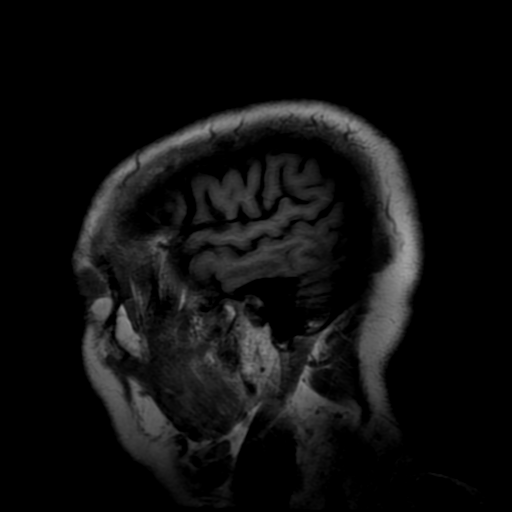
[im 7/25]
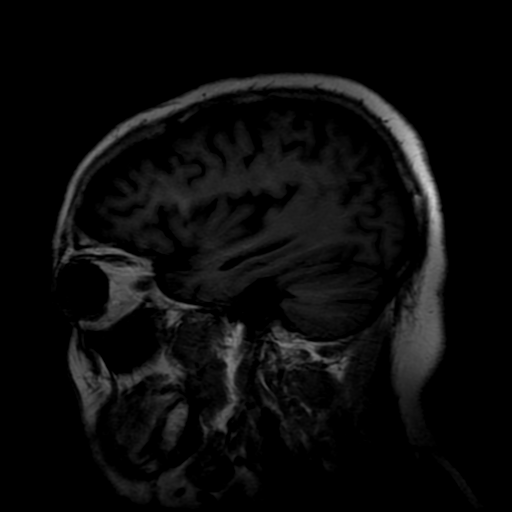
[im 11/25]
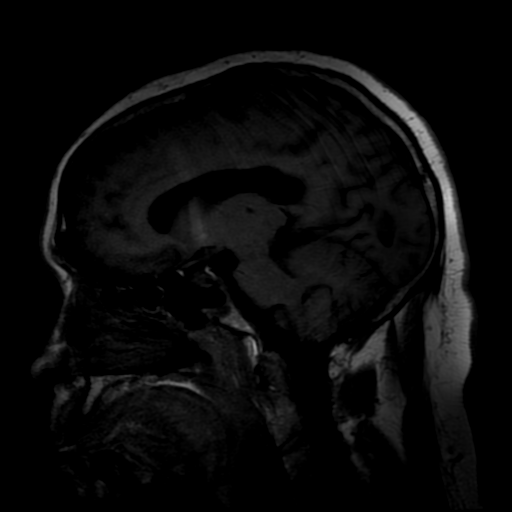
[im 14/25]
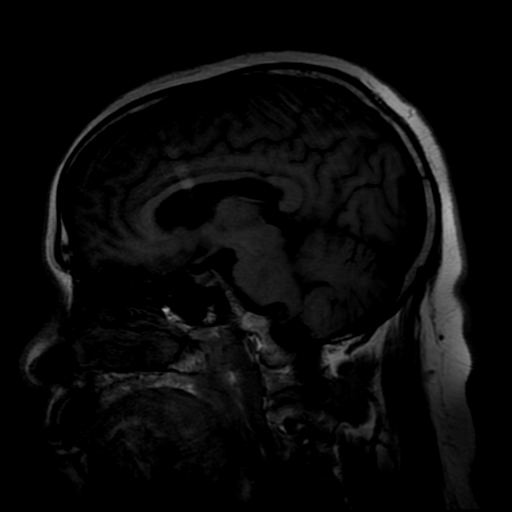
[im 18/25]
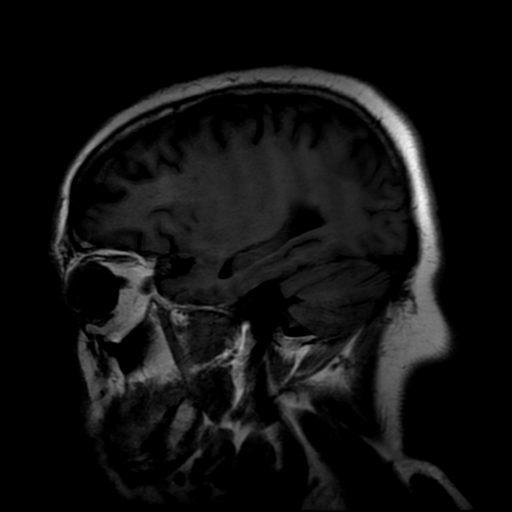
[im 21/25]
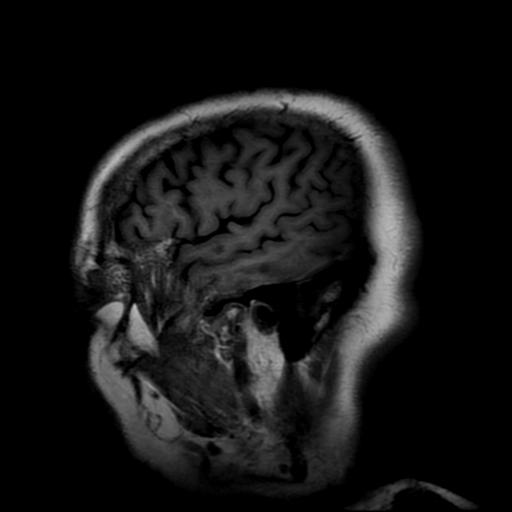
[im 25/25]
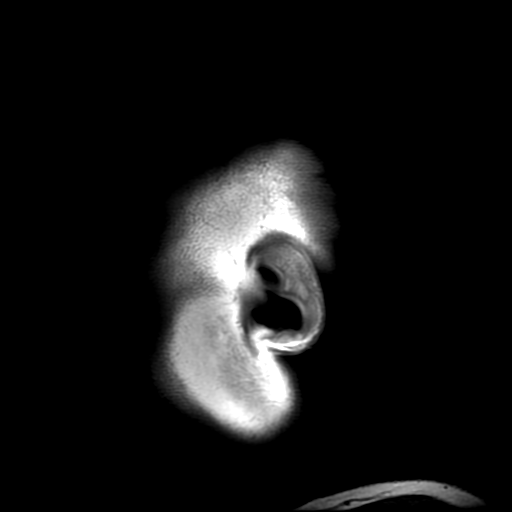

[Series 4: DWI · axial · 3.6mm · 1.02mm/px · z∈[-97,+46]mm · 27 of 82 slices shown (1 of 2)]
[im 1/82]
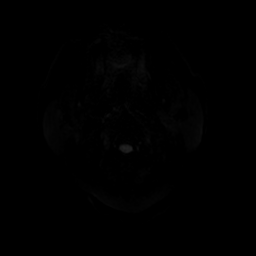
[im 4/82]
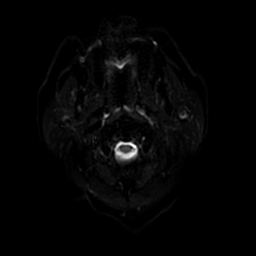
[im 7/82]
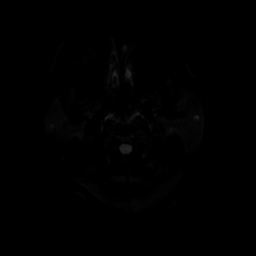
[im 10/82]
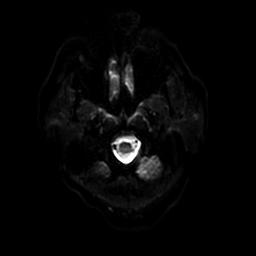
[im 13/82]
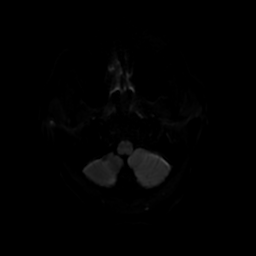
[im 16/82]
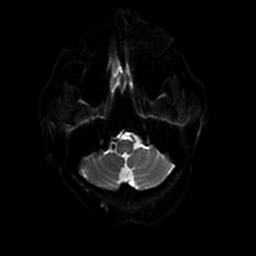
[im 19/82]
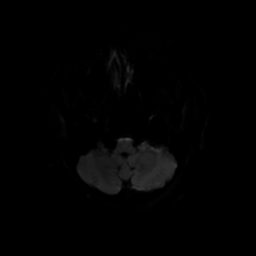
[im 22/82]
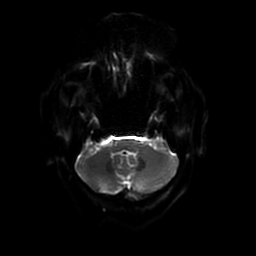
[im 25/82]
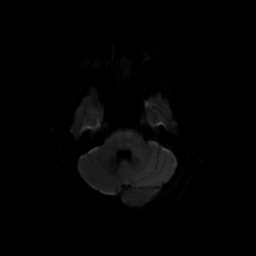
[im 29/82]
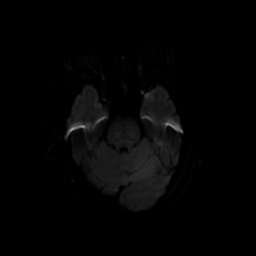
[im 32/82]
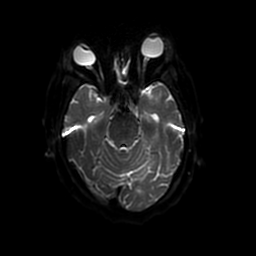
[im 35/82]
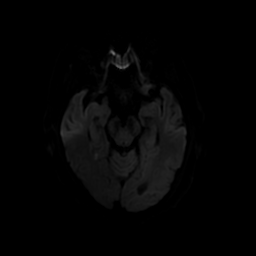
[im 38/82]
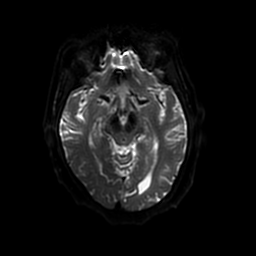
[im 41/82]
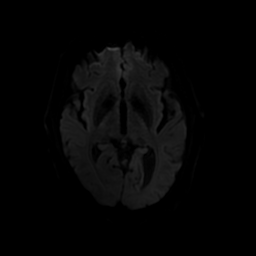
[im 44/82]
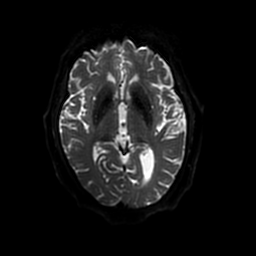
[im 47/82]
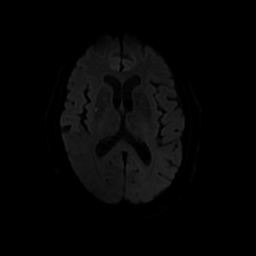
[im 50/82]
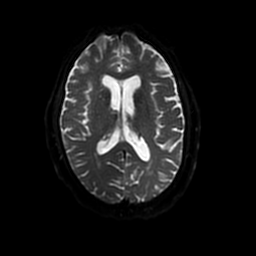
[im 53/82]
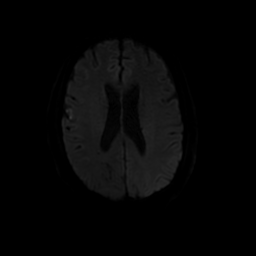
[im 57/82]
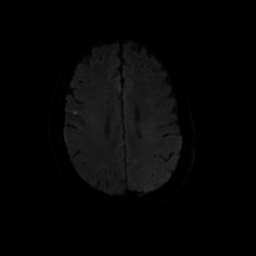
[im 60/82]
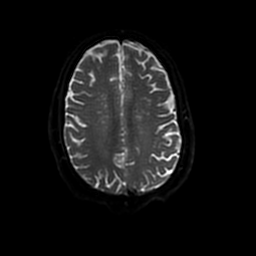
[im 63/82]
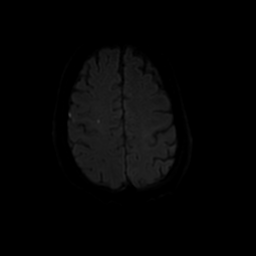
[im 66/82]
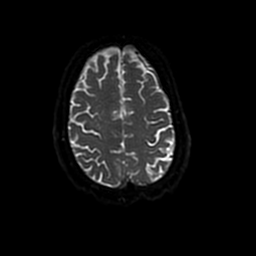
[im 69/82]
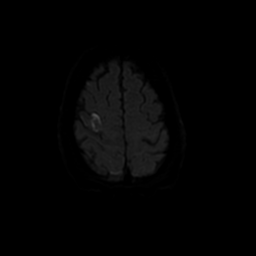
[im 72/82]
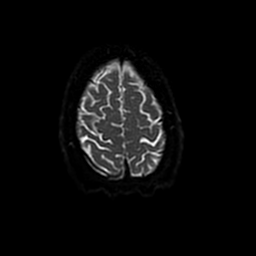
[im 75/82]
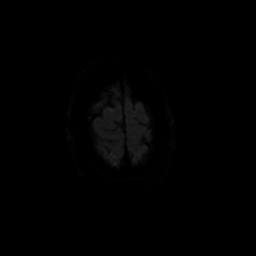
[im 78/82]
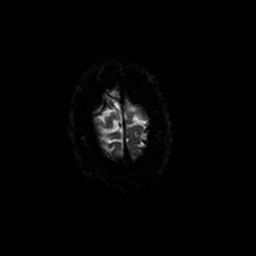
[im 82/82]
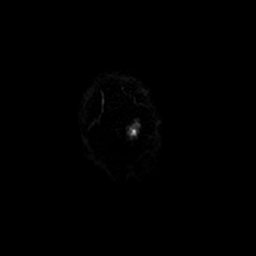

[Series 400: DWI · axial · 3.6mm · 1.02mm/px · z∈[-97,+46]mm · 11 of 40 slices shown (2 of 2)]
[im 1/40]
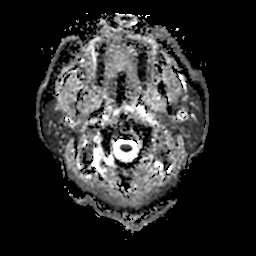
[im 4/40]
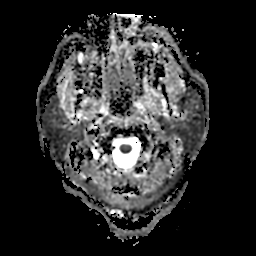
[im 7/40]
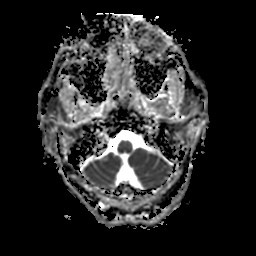
[im 10/40]
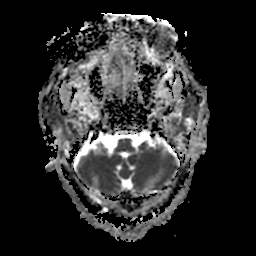
[im 14/40]
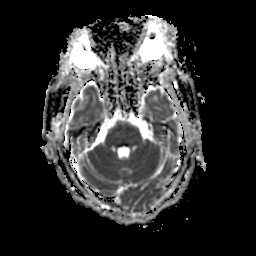
[im 17/40]
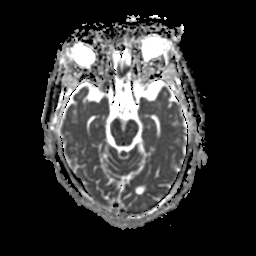
[im 20/40]
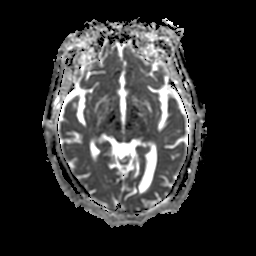
[im 23/40]
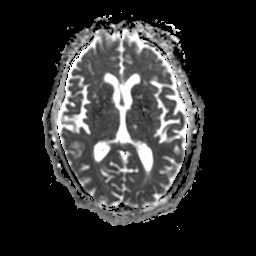
[im 27/40]
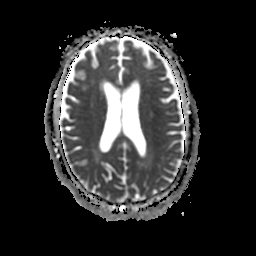
[im 33/40]
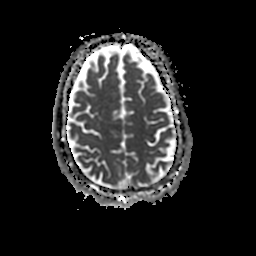
[im 40/40]
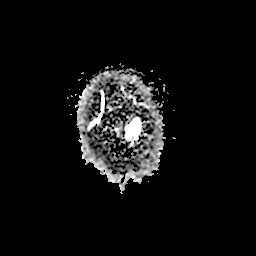

[46 of 48 positions shown; findings below may reference images not displayed]

FINDINGS: Patient is severely claustrophobic and could not complete the exam.

Sagittal T1 weighted images are unremarkable.

Axial diffusion-weighted imaging demonstrates multifocal areas of
acute infarction affecting the RIGHT lentiform nucleus, RIGHT
frontal operculum, RIGHT posterior frontal cortex, RIGHT superior
frontal cortex and subcortical white matter. No brainstem
involvement. No LEFT hemisphere involvement. Visualized extracranial
soft tissues unremarkable.

The area of concern on CT appears represent to partial volume
averaging.
IMPRESSION: Prematurely truncated exam due to claustrophobia.

Multiple areas of acute right-sided cerebral infarction, RIGHT MCA
territory.

## 2016-04-07 DIAGNOSIS — I10 Essential (primary) hypertension: Secondary | ICD-10-CM | POA: Diagnosis not present

## 2016-04-07 DIAGNOSIS — Z6841 Body Mass Index (BMI) 40.0 and over, adult: Secondary | ICD-10-CM | POA: Diagnosis not present

## 2016-04-07 DIAGNOSIS — E1129 Type 2 diabetes mellitus with other diabetic kidney complication: Secondary | ICD-10-CM | POA: Diagnosis not present

## 2016-04-07 DIAGNOSIS — R809 Proteinuria, unspecified: Secondary | ICD-10-CM | POA: Diagnosis not present

## 2016-05-01 ENCOUNTER — Emergency Department (HOSPITAL_COMMUNITY): Payer: Medicare Other

## 2016-05-01 ENCOUNTER — Encounter (HOSPITAL_COMMUNITY): Payer: Self-pay | Admitting: Emergency Medicine

## 2016-05-01 ENCOUNTER — Emergency Department (HOSPITAL_COMMUNITY)
Admission: EM | Admit: 2016-05-01 | Discharge: 2016-05-01 | Disposition: A | Discharge status: Home / Self Care | Payer: Medicare Other | Attending: Emergency Medicine | Admitting: Emergency Medicine

## 2016-05-01 DIAGNOSIS — M25562 Pain in left knee: Secondary | ICD-10-CM | POA: Diagnosis present

## 2016-05-01 DIAGNOSIS — M1712 Unilateral primary osteoarthritis, left knee: Secondary | ICD-10-CM | POA: Diagnosis not present

## 2016-05-01 DIAGNOSIS — Z794 Long term (current) use of insulin: Secondary | ICD-10-CM | POA: Insufficient documentation

## 2016-05-01 DIAGNOSIS — Z7982 Long term (current) use of aspirin: Secondary | ICD-10-CM | POA: Diagnosis not present

## 2016-05-01 DIAGNOSIS — I10 Essential (primary) hypertension: Secondary | ICD-10-CM | POA: Insufficient documentation

## 2016-05-01 DIAGNOSIS — Z87891 Personal history of nicotine dependence: Secondary | ICD-10-CM | POA: Insufficient documentation

## 2016-05-01 DIAGNOSIS — E119 Type 2 diabetes mellitus without complications: Secondary | ICD-10-CM | POA: Insufficient documentation

## 2016-05-01 DIAGNOSIS — M25561 Pain in right knee: Secondary | ICD-10-CM | POA: Diagnosis not present

## 2016-05-01 DIAGNOSIS — E7800 Pure hypercholesterolemia, unspecified: Secondary | ICD-10-CM | POA: Diagnosis not present

## 2016-05-01 MED ORDER — MELOXICAM 7.5 MG PO TABS: 7.5000 mg | ORAL_TABLET | Freq: Every day | ORAL | Status: DC

## 2016-05-01 NOTE — ED Notes (Signed)
Pt states his left knee has been hurting with a little swelling for 3 weeks.  Thought it was gout and has been taking his gout medications, but no better.

## 2016-05-01 NOTE — ED Provider Notes (Signed)
CSN: [id number]     Arrival date & time 05/01/16  1643 History   First MD Initiated Contact with Patient 05/01/16 1741     Chief Complaint  Patient presents with  . Knee Pain     (Consider location/radiation/quality/duration/timing/severity/associated sxs/prior Treatment) Patient is a 70 y.o. male presenting with knee pain. The history is provided by the patient. No language interpreter was used.  Knee Pain Location:  Knee Time since incident:  3 weeks Injury: no   Knee location:  L knee Pain details:    Quality:  Aching   Radiates to:  Does not radiate   Severity:  Moderate   Duration:  3 weeks   Timing:  Constant Chronicity:  New Dislocation: no   Foreign body present:  No foreign bodies Prior injury to area:  No Relieved by:  Nothing Worsened by:  Nothing tried Ineffective treatments:  None tried Associated symptoms: swelling   Associated symptoms: no numbness   Risk factors: no concern for non-accidental trauma   Pt reports he has had pain and swelling for 3 weeks.  No relief with gout medications  Past Medical History  Diagnosis Date  . Diabetes mellitus without complication ([place])   . Hypertension   . Hypercholesterolemia    Past Surgical History  Procedure Laterality Date  . Hernia repair     Family History  Problem Relation Age of Onset  . Diabetes Brother    Social History  Substance Use Topics  . Smoking status: Former Smoker    Quit date: 11/01/2001  . Smokeless tobacco: Never Used  . Alcohol Use: No    Review of Systems  All other systems reviewed and are negative.     Allergies  Review of patient's allergies indicates no known allergies.  Home Medications   Prior to Admission medications   Medication Sig Start Date End Date Taking? Authorizing Provider  amLODipine (NORVASC) 10 MG tablet Take 10 mg by mouth daily. 10/16/14  Yes Historical Provider, MD  aspirin EC 81 MG tablet Take 81 mg by mouth daily.   Yes Historical Provider, MD   atorvastatin (LIPITOR) 80 MG tablet Take 80 mg by mouth daily. 10/16/14  Yes Historical Provider, MD  insulin aspart (NOVOLOG) 100 UNIT/ML injection Inject 10 Units into the skin 3 (three) times daily before meals.   Yes Historical Provider, MD  LEVEMIR 100 UNIT/ML injection Inject 55 Units into the skin 2 (two) times daily.  07/26/14  Yes Historical Provider, MD  losartan-hydrochlorothiazide (HYZAAR) 100-25 MG per tablet Take 1 tablet by mouth daily. 08/17/14  Yes Historical Provider, MD  ZETIA 10 MG tablet Take 10 mg by mouth daily.  11/13/14  Yes Historical Provider, MD  meloxicam (MOBIC) 7.5 MG tablet Take 1 tablet (7.5 mg total) by mouth daily. 05/01/16   [place], PA-C   BP 188/75 mmHg  Pulse 57  Temp(Src) 97.4 F (36.3 C) (Oral)  Resp 18  Ht 5\' 11"  (1.803 m)  Wt 128.368 kg  BMI 39.49 kg/m2  SpO2 97% Physical Exam  Constitutional: He appears well-developed and well-nourished.  Musculoskeletal: He exhibits tenderness.  Effusion left knee,  Pain with movement.  From,  No medial or lateral instability  nv and ns intact  Neurological: He is alert.  Skin: Skin is warm.  Nursing note and vitals reviewed.   ED Course  Procedures (including critical care time) Labs Review Labs Reviewed - No data to display  Imaging Review Dg Knee Complete 4 Views Left  05/01/2016  CLINICAL DATA:  Right knee pain 3 weeks.  No injury. EXAM: LEFT KNEE - COMPLETE 4+ VIEW COMPARISON:  None. FINDINGS: No evidence of fracture, dislocation, or joint effusion. No evidence of arthropathy or other focal bone abnormality. Soft tissues are unremarkable. IMPRESSION: Negative. Electronically Signed   By: Marin Olp M.D.   On: 05/01/2016 17:20   I have personally reviewed and evaluated these images and lab results as part of my medical decision-making.   EKG Interpretation None      MDM  Pt advised to see Dr. Ninfa Linden for evaluation Ice Meloxicam    Final diagnoses:  Primary osteoarthritis of left  knee   Meds ordered this encounter  Medications  . insulin aspart (NOVOLOG) 100 UNIT/ML injection    Sig: Inject 10 Units into the skin 3 (three) times daily before meals.  Marland Kitchen aspirin EC 81 MG tablet    Sig: Take 81 mg by mouth daily.  . meloxicam (MOBIC) 7.5 MG tablet    Sig: Take 1 tablet (7.5 mg total) by mouth daily.    Dispense:  10 tablet    Refill:  0    Order Specific Question:  Supervising Provider    Answer:  Noemi Chapel [[id number]]   An After Visit Summary was printed and given to the patient.    Hollace Kinnier [place], PA-C 05/01/16 1958  Milton Ferguson, MD 05/02/16 1115

## 2016-05-01 NOTE — Discharge Instructions (Signed)
Knee Pain Knee pain is a very common symptom and can have many causes. Knee pain often goes away when you follow your health care provider's instructions for relieving pain and discomfort at home. However, knee pain can develop into a condition that needs treatment. Some conditions may include:  Arthritis caused by wear and tear (osteoarthritis).  Arthritis caused by swelling and irritation (rheumatoid arthritis or gout).  A cyst or growth in your knee.  An infection in your knee joint.  An injury that will not heal.  Damage, swelling, or irritation of the tissues that support your knee (torn ligaments or tendinitis). If your knee pain continues, additional tests may be ordered to diagnose your condition. Tests may include X-rays or other imaging studies of your knee. You may also need to have fluid removed from your knee. Treatment for ongoing knee pain depends on the cause, but treatment may include:  Medicines to relieve pain or swelling.  Steroid injections in your knee.  Physical therapy.  Surgery. HOME CARE INSTRUCTIONS  Take medicines only as directed by your health care provider.  Rest your knee and keep it raised (elevated) while you are resting.  Do not do things that cause or worsen pain.  Avoid high-impact activities or exercises, such as running, jumping rope, or doing jumping jacks.  Apply ice to the knee area:  Put ice in a plastic bag.  Place a towel between your skin and the bag.  Leave the ice on for 20 minutes, 2-3 times a day.  Ask your health care provider if you should wear an elastic knee support.  Keep a pillow under your knee when you sleep.  Lose weight if you are overweight. Extra weight can put pressure on your knee.  Do not use any tobacco products, including cigarettes, chewing tobacco, or electronic cigarettes. If you need help quitting, ask your health care provider. Smoking may slow the healing of any bone and joint problems that you may  have. SEEK MEDICAL CARE IF:  Your knee pain continues, changes, or gets worse.  You have a fever along with knee pain.  Your knee buckles or locks up.  Your knee becomes more swollen. SEEK IMMEDIATE MEDICAL CARE IF:   Your knee joint feels hot to the touch.  You have chest pain or trouble breathing.   This information is not intended to replace advice given to you by your health care provider. Make sure you discuss any questions you have with your health care provider.   Document Released: 08/15/2007 Document Revised: 11/08/2014 Document Reviewed: 06/03/2014 Elsevier Interactive Patient Education 2016 Elsevier Inc. Osteoarthritis Osteoarthritis is a disease that causes soreness and inflammation of a joint. It occurs when the cartilage at the affected joint wears down. Cartilage acts as a cushion, covering the ends of bones where they meet to form a joint. Osteoarthritis is the most common form of arthritis. It often occurs in older people. The joints affected most often by this condition include those in the:  Ends of the fingers.  Thumbs.  Neck.  Lower back.  Knees.  Hips. CAUSES  Over time, the cartilage that covers the ends of bones begins to wear away. This causes bone to rub on bone, producing pain and stiffness in the affected joints.  RISK FACTORS Certain factors can increase your chances of having osteoarthritis, including:  Older age.  Excessive body weight.  Overuse of joints.  Previous joint injury. SIGNS AND SYMPTOMS   Pain, swelling, and stiffness in the  joint.  Over time, the joint may lose its normal shape.  Small deposits of bone (osteophytes) may grow on the edges of the joint.  Bits of bone or cartilage can break off and float inside the joint space. This may cause more pain and damage. DIAGNOSIS  Your health care provider will do a physical exam and ask about your symptoms. Various tests may be ordered, such as:  X-rays of the affected  joint.  Blood tests to rule out other types of arthritis. Additional tests may be used to diagnose your condition. TREATMENT  Goals of treatment are to control pain and improve joint function. Treatment plans may include:  A prescribed exercise program that allows for rest and joint relief.  A weight control plan.  Pain relief techniques, such as:  Properly applied heat and cold.  Electric pulses delivered to nerve endings under the skin (transcutaneous electrical nerve stimulation [TENS]).  Massage.  Certain nutritional supplements.  Medicines to control pain, such as:  Acetaminophen.  Nonsteroidal anti-inflammatory drugs (NSAIDs), such as naproxen.  Narcotic or central-acting agents, such as tramadol.  Corticosteroids. These can be given orally or as an injection.  Surgery to reposition the bones and relieve pain (osteotomy) or to remove loose pieces of bone and cartilage. Joint replacement may be needed in advanced states of osteoarthritis. HOME CARE INSTRUCTIONS   Take medicines only as directed by your health care provider.  Maintain a healthy weight. Follow your health care provider's instructions for weight control. This may include dietary instructions.  Exercise as directed. Your health care provider can recommend specific types of exercise. These may include:  Strengthening exercises. These are done to strengthen the muscles that support joints affected by arthritis. They can be performed with weights or with exercise bands to add resistance.  Aerobic activities. These are exercises, such as brisk walking or low-impact aerobics, that get your heart pumping.  Range-of-motion activities. These keep your joints limber.  Balance and agility exercises. These help you maintain daily living skills.  Rest your affected joints as directed by your health care provider.  Keep all follow-up visits as directed by your health care provider. SEEK MEDICAL CARE IF:    Your skin turns red.  You develop a rash in addition to your joint pain.  You have worsening joint pain.  You have a fever along with joint or muscle aches. SEEK IMMEDIATE MEDICAL CARE IF:  You have a significant loss of weight or appetite.  You have night sweats. [place] of Arthritis and Musculoskeletal and Skin Diseases: www.niams.SouthExposed.es  [employer] on Aging: http://kim-miller.com/  American College of Rheumatology: www.rheumatology.org   This information is not intended to replace advice given to you by your health care provider. Make sure you discuss any questions you have with your health care provider.   Document Released: 10/18/2005 Document Revised: 11/08/2014 Document Reviewed: 06/25/2013 Elsevier Interactive Patient Education [employer].

## 2016-06-09 DIAGNOSIS — R808 Other proteinuria: Secondary | ICD-10-CM | POA: Diagnosis not present

## 2016-06-09 DIAGNOSIS — M109 Gout, unspecified: Secondary | ICD-10-CM | POA: Diagnosis not present

## 2016-06-09 DIAGNOSIS — I10 Essential (primary) hypertension: Secondary | ICD-10-CM | POA: Diagnosis not present

## 2016-06-09 DIAGNOSIS — I69998 Other sequelae following unspecified cerebrovascular disease: Secondary | ICD-10-CM | POA: Diagnosis not present

## 2016-06-09 DIAGNOSIS — E1129 Type 2 diabetes mellitus with other diabetic kidney complication: Secondary | ICD-10-CM | POA: Diagnosis not present

## 2016-06-09 DIAGNOSIS — Z6841 Body Mass Index (BMI) 40.0 and over, adult: Secondary | ICD-10-CM | POA: Diagnosis not present

## 2016-06-09 DIAGNOSIS — E7800 Pure hypercholesterolemia, unspecified: Secondary | ICD-10-CM | POA: Diagnosis not present

## 2016-06-09 DIAGNOSIS — Z794 Long term (current) use of insulin: Secondary | ICD-10-CM | POA: Diagnosis not present

## 2016-06-09 DIAGNOSIS — F5221 Male erectile disorder: Secondary | ICD-10-CM | POA: Diagnosis not present

## 2016-06-09 DIAGNOSIS — G4733 Obstructive sleep apnea (adult) (pediatric): Secondary | ICD-10-CM | POA: Diagnosis not present

## 2016-06-09 DIAGNOSIS — I639 Cerebral infarction, unspecified: Secondary | ICD-10-CM | POA: Diagnosis not present

## 2016-07-21 DIAGNOSIS — Z6841 Body Mass Index (BMI) 40.0 and over, adult: Secondary | ICD-10-CM | POA: Diagnosis not present

## 2016-07-21 DIAGNOSIS — I10 Essential (primary) hypertension: Secondary | ICD-10-CM | POA: Diagnosis not present

## 2016-07-21 DIAGNOSIS — Z23 Encounter for immunization: Secondary | ICD-10-CM | POA: Diagnosis not present

## 2016-07-21 DIAGNOSIS — R809 Proteinuria, unspecified: Secondary | ICD-10-CM | POA: Diagnosis not present

## 2016-07-21 DIAGNOSIS — E1129 Type 2 diabetes mellitus with other diabetic kidney complication: Secondary | ICD-10-CM | POA: Diagnosis not present

## 2016-09-29 DIAGNOSIS — I69998 Other sequelae following unspecified cerebrovascular disease: Secondary | ICD-10-CM | POA: Diagnosis not present

## 2016-09-29 DIAGNOSIS — E1129 Type 2 diabetes mellitus with other diabetic kidney complication: Secondary | ICD-10-CM | POA: Diagnosis not present

## 2016-09-29 DIAGNOSIS — M109 Gout, unspecified: Secondary | ICD-10-CM | POA: Diagnosis not present

## 2016-09-29 DIAGNOSIS — R808 Other proteinuria: Secondary | ICD-10-CM | POA: Diagnosis not present

## 2016-09-29 DIAGNOSIS — I10 Essential (primary) hypertension: Secondary | ICD-10-CM | POA: Diagnosis not present

## 2016-09-29 DIAGNOSIS — Z6841 Body Mass Index (BMI) 40.0 and over, adult: Secondary | ICD-10-CM | POA: Diagnosis not present

## 2016-09-29 DIAGNOSIS — F5221 Male erectile disorder: Secondary | ICD-10-CM | POA: Diagnosis not present

## 2016-09-29 DIAGNOSIS — E7800 Pure hypercholesterolemia, unspecified: Secondary | ICD-10-CM | POA: Diagnosis not present

## 2016-09-29 DIAGNOSIS — I639 Cerebral infarction, unspecified: Secondary | ICD-10-CM | POA: Diagnosis not present

## 2016-09-29 DIAGNOSIS — G4733 Obstructive sleep apnea (adult) (pediatric): Secondary | ICD-10-CM | POA: Diagnosis not present

## 2016-09-29 DIAGNOSIS — Z794 Long term (current) use of insulin: Secondary | ICD-10-CM | POA: Diagnosis not present

## 2016-11-10 DIAGNOSIS — Z6841 Body Mass Index (BMI) 40.0 and over, adult: Secondary | ICD-10-CM | POA: Diagnosis not present

## 2016-11-10 DIAGNOSIS — I10 Essential (primary) hypertension: Secondary | ICD-10-CM | POA: Diagnosis not present

## 2016-11-10 DIAGNOSIS — E1129 Type 2 diabetes mellitus with other diabetic kidney complication: Secondary | ICD-10-CM | POA: Diagnosis not present

## 2016-12-01 DIAGNOSIS — E1129 Type 2 diabetes mellitus with other diabetic kidney complication: Secondary | ICD-10-CM | POA: Diagnosis not present

## 2016-12-01 DIAGNOSIS — M109 Gout, unspecified: Secondary | ICD-10-CM | POA: Diagnosis not present

## 2016-12-01 DIAGNOSIS — R8299 Other abnormal findings in urine: Secondary | ICD-10-CM | POA: Diagnosis not present

## 2016-12-01 DIAGNOSIS — E7800 Pure hypercholesterolemia, unspecified: Secondary | ICD-10-CM | POA: Diagnosis not present

## 2016-12-01 DIAGNOSIS — Z125 Encounter for screening for malignant neoplasm of prostate: Secondary | ICD-10-CM | POA: Diagnosis not present

## 2016-12-08 DIAGNOSIS — Z6841 Body Mass Index (BMI) 40.0 and over, adult: Secondary | ICD-10-CM | POA: Diagnosis not present

## 2016-12-08 DIAGNOSIS — I69998 Other sequelae following unspecified cerebrovascular disease: Secondary | ICD-10-CM | POA: Diagnosis not present

## 2016-12-08 DIAGNOSIS — Z1389 Encounter for screening for other disorder: Secondary | ICD-10-CM | POA: Diagnosis not present

## 2016-12-08 DIAGNOSIS — R808 Other proteinuria: Secondary | ICD-10-CM | POA: Diagnosis not present

## 2016-12-08 DIAGNOSIS — Z794 Long term (current) use of insulin: Secondary | ICD-10-CM | POA: Diagnosis not present

## 2016-12-08 DIAGNOSIS — I639 Cerebral infarction, unspecified: Secondary | ICD-10-CM | POA: Diagnosis not present

## 2016-12-08 DIAGNOSIS — E7800 Pure hypercholesterolemia, unspecified: Secondary | ICD-10-CM | POA: Diagnosis not present

## 2016-12-08 DIAGNOSIS — F5221 Male erectile disorder: Secondary | ICD-10-CM | POA: Diagnosis not present

## 2016-12-08 DIAGNOSIS — E1129 Type 2 diabetes mellitus with other diabetic kidney complication: Secondary | ICD-10-CM | POA: Diagnosis not present

## 2016-12-08 DIAGNOSIS — G4733 Obstructive sleep apnea (adult) (pediatric): Secondary | ICD-10-CM | POA: Diagnosis not present

## 2016-12-08 DIAGNOSIS — Z0000 Encounter for general adult medical examination without abnormal findings: Secondary | ICD-10-CM | POA: Diagnosis not present

## 2016-12-13 ENCOUNTER — Telehealth: Payer: Self-pay | Admitting: Internal Medicine

## 2016-12-13 DIAGNOSIS — Z1212 Encounter for screening for malignant neoplasm of rectum: Secondary | ICD-10-CM | POA: Diagnosis not present

## 2016-12-13 NOTE — Telephone Encounter (Signed)
A user error has taken place: ERROR °

## 2017-01-11 DIAGNOSIS — H25013 Cortical age-related cataract, bilateral: Secondary | ICD-10-CM | POA: Diagnosis not present

## 2017-01-19 DIAGNOSIS — E1129 Type 2 diabetes mellitus with other diabetic kidney complication: Secondary | ICD-10-CM | POA: Diagnosis not present

## 2017-01-19 DIAGNOSIS — Z6841 Body Mass Index (BMI) 40.0 and over, adult: Secondary | ICD-10-CM | POA: Diagnosis not present

## 2017-01-19 DIAGNOSIS — I10 Essential (primary) hypertension: Secondary | ICD-10-CM | POA: Diagnosis not present

## 2017-02-02 ENCOUNTER — Encounter: Payer: Self-pay | Admitting: Internal Medicine

## 2017-02-02 ENCOUNTER — Ambulatory Visit (INDEPENDENT_AMBULATORY_CARE_PROVIDER_SITE_OTHER): Payer: Medicare Other | Admitting: Internal Medicine

## 2017-02-02 VITALS — BP 144/80 | HR 64 | Ht 68.0 in | Wt 288.1 lb

## 2017-02-02 DIAGNOSIS — Z1211 Encounter for screening for malignant neoplasm of colon: Secondary | ICD-10-CM | POA: Diagnosis not present

## 2017-02-02 DIAGNOSIS — K5730 Diverticulosis of large intestine without perforation or abscess without bleeding: Secondary | ICD-10-CM

## 2017-02-02 NOTE — Progress Notes (Addendum)
   Jacob Avery 71 y.o. 13-Dec-1945 [id number] Referred by: [name], MD  Assessment & Plan:   Encounter Diagnoses  Name Primary?  . Sigmoid diverticulosis - severe preventing colonoscopy Yes  . Colon cancer screening    It sounds like he is up-to-date with screening Hemoccults and they have been negative. Given the extreme difficulty in essentially inability to complete a colonoscopy in him due to his diverticular disease I would continue to do that and if one becomes positive we will address that as best we can. That could mean trying an ultraslim colonoscope if we could get one at that time, versus doing a CT of the abdomen and pelvis. I don't think a CT colonoscopy is covered for him as he is Medicare traditional. I appreciate the opportunity to care for this patient.  Recent CBC normal.  CC: [name], MD   Subjective:   Chief Complaint: Colon cancer screening, colonoscopy  HPI The patient is a very pleasant elderly African-American man with prior attempted colonoscopy, incomplete due to severe sigmoid diverticulosis. This was in 2006. In 2011 he had a CT colonoscopy. It showed the severe diverticulosis, and the rectosigmoid colon couldn't be evaluated well, and there was a suspected polypoid defect in the right rectal wall. I did a flexible sigmoidoscopy that was negative for polyps. Since that time he tells me he is done annual Hemoccult through primary care and they have been negative. On have those results today but he is convinced he has never had blood in his stools he doesn't see any and is not having any active GI symptoms. Medications, allergies, past medical history, past surgical history, family history and social history are reviewed and updated in the EMR.  Review of Systems Some back pain night sweats allergies. All other review of systems negative.  Objective:   Physical Exam BP (!) 144/80 (BP Location: Left Arm, Patient Position: Sitting, Cuff  Size: Normal)   Pulse 64   Ht 5\' 8"  (1.727 m) Comment: height measured without shoes  Wt 288 lb 2 oz (130.7 kg)   BMI 43.81 kg/m  Pleasant obese black man in no acute distress  10 minutes time spent with the patient today over half of which was in counseling and coordination care

## 2017-02-02 NOTE — Patient Instructions (Signed)
Please continue to do your yearly hemoccult testing.     I appreciate the opportunity to care for you. Silvano Rusk, MD, [facility]

## 2017-03-23 DIAGNOSIS — G4733 Obstructive sleep apnea (adult) (pediatric): Secondary | ICD-10-CM | POA: Diagnosis not present

## 2017-03-23 DIAGNOSIS — I69998 Other sequelae following unspecified cerebrovascular disease: Secondary | ICD-10-CM | POA: Diagnosis not present

## 2017-03-23 DIAGNOSIS — Z794 Long term (current) use of insulin: Secondary | ICD-10-CM | POA: Diagnosis not present

## 2017-03-23 DIAGNOSIS — E7800 Pure hypercholesterolemia, unspecified: Secondary | ICD-10-CM | POA: Diagnosis not present

## 2017-03-23 DIAGNOSIS — I10 Essential (primary) hypertension: Secondary | ICD-10-CM | POA: Diagnosis not present

## 2017-03-23 DIAGNOSIS — Z6841 Body Mass Index (BMI) 40.0 and over, adult: Secondary | ICD-10-CM | POA: Diagnosis not present

## 2017-03-23 DIAGNOSIS — E1129 Type 2 diabetes mellitus with other diabetic kidney complication: Secondary | ICD-10-CM | POA: Diagnosis not present

## 2017-03-23 DIAGNOSIS — M109 Gout, unspecified: Secondary | ICD-10-CM | POA: Diagnosis not present

## 2017-04-01 DIAGNOSIS — K922 Gastrointestinal hemorrhage, unspecified: Secondary | ICD-10-CM

## 2017-04-01 HISTORY — DX: Gastrointestinal hemorrhage, unspecified: K92.2

## 2017-05-05 ENCOUNTER — Emergency Department (HOSPITAL_COMMUNITY)
Admission: EM | Admit: 2017-05-05 | Discharge: 2017-05-05 | Disposition: A | Discharge status: Home / Self Care | Payer: Medicare Other | Attending: Emergency Medicine | Admitting: Emergency Medicine

## 2017-05-05 ENCOUNTER — Encounter (HOSPITAL_COMMUNITY): Payer: Self-pay | Admitting: Emergency Medicine

## 2017-05-05 DIAGNOSIS — I119 Hypertensive heart disease without heart failure: Secondary | ICD-10-CM | POA: Insufficient documentation

## 2017-05-05 DIAGNOSIS — I639 Cerebral infarction, unspecified: Secondary | ICD-10-CM | POA: Insufficient documentation

## 2017-05-05 DIAGNOSIS — I6320 Cerebral infarction due to unspecified occlusion or stenosis of unspecified precerebral arteries: Secondary | ICD-10-CM | POA: Diagnosis not present

## 2017-05-05 DIAGNOSIS — E119 Type 2 diabetes mellitus without complications: Secondary | ICD-10-CM | POA: Diagnosis not present

## 2017-05-05 DIAGNOSIS — Z87891 Personal history of nicotine dependence: Secondary | ICD-10-CM | POA: Diagnosis not present

## 2017-05-05 DIAGNOSIS — Z7902 Long term (current) use of antithrombotics/antiplatelets: Secondary | ICD-10-CM | POA: Diagnosis not present

## 2017-05-05 DIAGNOSIS — Z7984 Long term (current) use of oral hypoglycemic drugs: Secondary | ICD-10-CM | POA: Insufficient documentation

## 2017-05-05 DIAGNOSIS — Z6841 Body Mass Index (BMI) 40.0 and over, adult: Secondary | ICD-10-CM | POA: Diagnosis not present

## 2017-05-05 DIAGNOSIS — K921 Melena: Secondary | ICD-10-CM | POA: Diagnosis not present

## 2017-05-05 DIAGNOSIS — I10 Essential (primary) hypertension: Secondary | ICD-10-CM | POA: Diagnosis not present

## 2017-05-05 DIAGNOSIS — K922 Gastrointestinal hemorrhage, unspecified: Secondary | ICD-10-CM | POA: Insufficient documentation

## 2017-05-05 DIAGNOSIS — Z8673 Personal history of transient ischemic attack (TIA), and cerebral infarction without residual deficits: Secondary | ICD-10-CM | POA: Diagnosis not present

## 2017-05-05 DIAGNOSIS — K5790 Diverticulosis of intestine, part unspecified, without perforation or abscess without bleeding: Secondary | ICD-10-CM | POA: Diagnosis not present

## 2017-05-05 DIAGNOSIS — Z79899 Other long term (current) drug therapy: Secondary | ICD-10-CM | POA: Diagnosis not present

## 2017-05-05 DIAGNOSIS — K625 Hemorrhage of anus and rectum: Secondary | ICD-10-CM | POA: Diagnosis present

## 2017-05-05 DIAGNOSIS — Z7982 Long term (current) use of aspirin: Secondary | ICD-10-CM | POA: Diagnosis not present

## 2017-05-05 LAB — TYPE AND SCREEN
ABO/RH(D): A POS
ANTIBODY SCREEN: NEGATIVE

## 2017-05-05 LAB — COMPREHENSIVE METABOLIC PANEL
ALBUMIN: 3.7 g/dL (ref 3.5–5.0)
ALT: 17 U/L (ref 17–63)
AST: 15 U/L (ref 15–41)
Alkaline Phosphatase: 85 U/L (ref 38–126)
Anion gap: 8 (ref 5–15)
BILIRUBIN TOTAL: 0.3 mg/dL (ref 0.3–1.2)
BUN: 22 mg/dL — AB (ref 6–20)
CO2: 28 mmol/L (ref 22–32)
CREATININE: 0.93 mg/dL (ref 0.61–1.24)
Calcium: 9 mg/dL (ref 8.9–10.3)
Chloride: 106 mmol/L (ref 101–111)
GFR calc Af Amer: >60 mL/min (ref >60)
GLUCOSE: 127 mg/dL — AB (ref 65–99)
POTASSIUM: 3.8 mmol/L (ref 3.5–5.1)
Sodium: 142 mmol/L (ref 135–145)
TOTAL PROTEIN: 6.8 g/dL (ref 6.5–8.1)

## 2017-05-05 LAB — CBC
HCT: 37.9 % — ABNORMAL LOW (ref 39.0–52.0)
Hemoglobin: 11.3 g/dL — ABNORMAL LOW (ref 13.0–17.0)
MCH: 23.3 pg — ABNORMAL LOW (ref 26.0–34.0)
MCHC: 29.8 g/dL — AB (ref 30.0–36.0)
MCV: 78.1 fL (ref 78.0–100.0)
PLATELETS: 295 10*3/uL (ref 150–400)
RBC: 4.85 MIL/uL (ref 4.22–5.81)
RDW: 17 % — ABNORMAL HIGH (ref 11.5–15.5)
WBC: 10.4 10*3/uL (ref 4.0–10.5)

## 2017-05-05 LAB — ABO/RH: ABO/RH(D): A POS

## 2017-05-05 NOTE — ED Triage Notes (Signed)
Pt. Stated Ive had diarrhea for about  A week and its just been blood of all colors. Went to doctor and they sent me here.

## 2017-05-05 NOTE — Discharge Instructions (Signed)
Do not take aspirin or Plavix until bleeding stops or until instructed by your  family doctor. Go to your family doctor's office tomorrow for recheck of hemoglobin. Report back to emergency department if bleeding is heavier, and developed dizziness, lightheadedness, weakness, abdominal pain or any new concerning symptoms.

## 2017-05-05 NOTE — ED Provider Notes (Signed)
[facility] DEPT Provider Note   CSN: [id number] Arrival date & time: 05/05/17  1515     History   Chief Complaint Chief Complaint  Patient presents with  . Diarrhea  . Rectal Bleeding    HPI Jacob Avery is a 71 y.o. male.  HPI Jacob Avery is a 71 y.o. male with history of diabetes, CVA, hypertension, diverticulosis, presents to emergency department complaining of rectal bleeding. Patient states he has been bleeding for 4 days and states it is improving. He reports dark red loose bowel movements. He states he had initially 3-4 day, and today only had one. He denies abdominal pain. No nausea or vomiting. No dizziness or lightheadedness. He went to his doctor today where he was found to have hemoglobin 11, his normal is around 14, he was sent here for further evaluation. Pt reports hx of the same, states bleeding was due to diverticulosis.   Past Medical History:  Diagnosis Date  . Arthritis   . CVA (cerebral vascular accident) ([place])   . Diabetes mellitus without complication ([place])   . Diverticulosis   . ED (erectile dysfunction)   . Gout   . Hypercholesterolemia   . Hypertension   . Morbid obesity ([place])   . OSA (obstructive sleep apnea)   . Vertigo     Patient Active Problem List   Diagnosis Date Noted  . HLD (hyperlipidemia) 01/07/2015  . Obstructive sleep apnea 01/07/2015  . Sleep apnea 12/11/2014  . Snoring 12/11/2014  . Severe obesity (BMI >= 40) ([place]) 12/11/2014  . Embolic stroke involving right carotid artery ([place]) 12/11/2014  . Palpitations 10/29/2014  . Carotid stenosis   . Essential hypertension 10/28/2014  . Hyperlipidemia LDL goal <70 10/28/2014  . Morbid obesity ([place]) 10/28/2014  . Diabetes ([place]) 10/28/2014  . likely OSA (obstructive sleep apnea) 10/28/2014  . Cerebral infarction due to occlusion or stenosis of precerebral artery ([place]) 10/25/2014  . OTHER SPECIFIED INTESTINAL OBSTRUCTION 12/22/2009  . DIVERTICULOSIS-COLON 12/22/2009    Past  Surgical History:  Procedure Laterality Date  . COLONOSCOPY    . UMBILICAL HERNIA REPAIR         Home Medications    Prior to Admission medications   Medication Sig Start Date End Date Taking? Authorizing Provider  acetaminophen (TYLENOL ARTHRITIS PAIN) 650 MG CR tablet Take 650 mg by mouth every 8 (eight) hours as needed for pain.    [provider]  amLODipine (NORVASC) 10 MG tablet Take 10 mg by mouth daily. 10/16/14   [provider]  aspirin EC 81 MG tablet Take 81 mg by mouth daily.    [provider]  clopidogrel (PLAVIX) 75 MG tablet Take 1 tablet by mouth daily. 01/29/17   [provider]  insulin aspart (NOVOLOG) 100 UNIT/ML injection Inject 10 Units into the skin 3 (three) times daily before meals.    [provider]  LANTUS SOLOSTAR 100 UNIT/ML Solostar Pen Inject 40 Units into the skin 2 (two) times daily. 12/14/16   [provider]  losartan-hydrochlorothiazide (HYZAAR) 100-25 MG per tablet Take 1 tablet by mouth daily. 08/17/14   [provider]  ZETIA 10 MG tablet Take 10 mg by mouth daily.  11/13/14   [provider]    Family History Family History  Problem Relation Age of Onset  . CVA Mother   . Alzheimer's disease Mother   . Heart attack Father   . Heart disease Father   . Diabetes Brother   . Rectal cancer Brother  Social History Social History  Substance Use Topics  . Smoking status: Former Smoker    Quit date: 11/01/2001  . Smokeless tobacco: Never Used  . Alcohol use No     Allergies   Patient has no known allergies.   Review of Systems Review of Systems  Constitutional: Negative for chills and fever.  Respiratory: Negative for cough, chest tightness and shortness of breath.   Cardiovascular: Negative for chest pain, palpitations and leg swelling.  Gastrointestinal: Positive for blood in stool and diarrhea. Negative for abdominal distention, abdominal pain, nausea and  vomiting.  Genitourinary: Negative for dysuria, frequency, hematuria and urgency.  Musculoskeletal: Negative for arthralgias, myalgias, neck pain and neck stiffness.  Skin: Negative for rash.  Allergic/Immunologic: Negative for immunocompromised state.  Neurological: Negative for dizziness, weakness, light-headedness, numbness and headaches.  All other systems reviewed and are negative.    Physical Exam Updated Vital Signs BP (!) 173/78   Pulse (!) 50   Temp 98.1 F (36.7 C) (Oral)   Resp 17   Ht 5\' 7"  (1.702 m)   Wt 129.7 kg (286 lb)   SpO2 98%   BMI 44.79 kg/m   Physical Exam  Constitutional: He appears well-developed and well-nourished. No distress.  HENT:  Head: Normocephalic and atraumatic.  Eyes: Conjunctivae are normal.  Neck: Neck supple.  Cardiovascular: Normal rate, regular rhythm and normal heart sounds.   Pulmonary/Chest: Effort normal. No respiratory distress. He has no wheezes. He has no rales.  Abdominal: Soft. Bowel sounds are normal. He exhibits no distension. There is no tenderness. There is no rebound and no guarding.  Musculoskeletal: He exhibits no edema.  Neurological: He is alert.  Skin: Skin is warm and dry.  Nursing note and vitals reviewed.    ED Treatments / Results  Labs (all labs ordered are listed, but only abnormal results are displayed) Labs Reviewed  COMPREHENSIVE METABOLIC PANEL - Abnormal; Notable for the following:       Result Value   Glucose, Bld 127 (*)    BUN 22 (*)    All other components within normal limits  CBC - Abnormal; Notable for the following:    Hemoglobin 11.3 (*)    HCT 37.9 (*)    MCH 23.3 (*)    MCHC 29.8 (*)    RDW 17.0 (*)    All other components within normal limits  TYPE AND SCREEN  ABO/RH    EKG  EKG Interpretation None       Radiology No results found.  Procedures Procedures (including critical care time)  Medications Ordered in ED Medications - No data to display   Initial  Impression / Assessment and Plan / ED Course  I have reviewed the triage vital signs and the nursing notes.  Pertinent labs & imaging results that were available during my care of the patient were reviewed by me and considered in my medical decision making (see chart for details).     Patient with history of diverticulosis, here with bloody stools for 4 days. He states that the bleeding seems to be subsiding. He only had one bloody bowel movement today. He was sent here from PCP for hemoglobin drop. His hemoglobin today is 11.3. The last hemoglobin have in the chart is from 2015. Patient is not tachycardic, he is not hypertensive. He is in no acute distress. No abdominal pain. I don't think he needs any acute imaging. Patient wants to go home. Discussed with Dr. Kendal Hymen. I will call neurology and  see what their recommendation is regarding stopping his Plavix and aspirin. Patient has been having blood in his stool for 4 days, he appears to be stable based on his exam and vital signs, we think it is reasonable for him to go home tonight and follow-up tomorrow with his primary care doctor for recheck of hemoglobin again. He continues to drop significantly, he may need acute intervention to slow down the bleeding.    Spoke with neurology, they advised that in this case the benefit outweighs the risk as far as stopping Plavix and aspirin this patient with GI bleed. He is on Plavix and aspirin for stroke that he had in 2005. I discussed this with patient and he is agreeable. Orthostatic vital signs are negative. He does not feel dizzy or lightheaded. We'll discharge him home, will have him follow-up in the office tomorrow morning for recheck of hemoglobin.  Vitals:   05/05/17 1845 05/05/17 1900 05/05/17 1915 05/05/17 1953  BP: (!) 163/76 (!) 167/79 (!) 173/78 (!) 148/72  Pulse: (!) 51 (!) 48 (!) 50 62  Resp: 17 16 17 19   Temp:      TempSrc:      SpO2: 97% 99% 98% 97%  Weight:      Height:          Final Clinical Impressions(s) / ED Diagnoses   Final diagnoses:  Gastrointestinal hemorrhage, unspecified gastrointestinal hemorrhage type    New Prescriptions Discharge Medication List as of 05/05/2017  8:12 PM       Jeannett Senior, PA-C 05/06/17 Kathreen Cornfield, MD 05/06/17 [phone]

## 2017-05-06 DIAGNOSIS — K921 Melena: Secondary | ICD-10-CM | POA: Diagnosis not present

## 2017-05-10 ENCOUNTER — Encounter: Payer: Self-pay | Admitting: Physician Assistant

## 2017-05-10 ENCOUNTER — Other Ambulatory Visit: Payer: Self-pay | Admitting: *Deleted

## 2017-05-10 ENCOUNTER — Telehealth: Payer: Self-pay | Admitting: *Deleted

## 2017-05-10 ENCOUNTER — Ambulatory Visit (INDEPENDENT_AMBULATORY_CARE_PROVIDER_SITE_OTHER): Payer: Medicare Other | Admitting: Physician Assistant

## 2017-05-10 VITALS — BP 118/52 | HR 63 | Ht 68.5 in | Wt 285.0 lb

## 2017-05-10 DIAGNOSIS — I10 Essential (primary) hypertension: Secondary | ICD-10-CM | POA: Diagnosis not present

## 2017-05-10 DIAGNOSIS — K625 Hemorrhage of anus and rectum: Secondary | ICD-10-CM | POA: Diagnosis not present

## 2017-05-10 DIAGNOSIS — Z1211 Encounter for screening for malignant neoplasm of colon: Secondary | ICD-10-CM | POA: Diagnosis not present

## 2017-05-10 DIAGNOSIS — E1129 Type 2 diabetes mellitus with other diabetic kidney complication: Secondary | ICD-10-CM | POA: Diagnosis not present

## 2017-05-10 NOTE — Patient Instructions (Signed)
You have been scheduled for a colonoscopy. Please follow written instructions given to you at your visit today. Location will be [facility], First Floor.  Please pick up your prep supplies at the pharmacy within the next 1-3 days.If you use inhalers (even only as needed), please bring them with you on the day of your procedure. Your physician has requested that you go to www.startemmi.com and enter the access code given to you at your visit today. This web site gives a general overview about your procedure. However, you should still follow specific instructions given to you by our office regarding your preparation for the procedure.

## 2017-05-10 NOTE — Progress Notes (Addendum)
Chief Complaint: Rectal Bleeding  HPI:   Mr. Jacob Avery is a 71 year old African-American male with a past medical history of CVA on Plavix, diabetes, hypertension, morbid obesity and obstructive sleep apnea,  who was referred to me by [name], Fransico Him, MD for a complaint of rectal bleeding .     Patient had recently seen Dr. Carlean Purl on 02/02/17 to discuss colon cancer screening. At that time, it was noted that in 2006 the patient had an incomplete colonoscopy due to severe sigmoid diverticulosis. He then had a CT colonoscopy in 2011 which showed severe diverticulosis but the rectosigmoid colon could not be evaluated and there was a suspected polypoid defect in the right rectal wall. A flexible sigmoidoscopy was then done and negative for polyps. Since that time he had annual Hemoccults which were negative. It was recommended that he continue to have annual Hemoccults and as long as there were negative and would not push for further screening.    CBC 05/05/17  Shows hgb 11.1.     Today, the patient presented to clinic accompanied by his wife and tells me that on Monday night of last week, 05/02/17 he started with bright red blood per rectum. He tells me that he had 2-3 bowel movements at night which were just blood and no stool and this continued into Tuesday and Wednesday and he had 3 further "blood movements" those days, on Thursday he went to see his primary care provider and that morning did have some stool with just a small amount of bright red blood, they sent him to the ER. The ER told him that he had a hemoglobin drop of "3 g". Patient explains that they wanted to admit him but he didn't want to stay. Apparently they checked for orthostatic hypotension and he did not have this so they told him to go home and have a recheck of his labs on Friday. Most recent CBC was as above with a hemoglobin of 11.1. Per his PCP this was an improvement to prior. We do not have previous labs. Patient tells me he has seen  no further blood since Thursday of last week. He denies any abdominal pain or rectal pain.   Patient denies fever, chills, change in bowel habits, weight loss, anorexia, nausea, vomiting, heartburn, reflux or abdominal pain.  Past Medical History:  Diagnosis Date  . Arthritis   . CVA (cerebral vascular accident) ([place])   . Diabetes mellitus without complication ([place])   . Diverticulosis   . ED (erectile dysfunction)   . Gout   . Hypercholesterolemia   . Hypertension   . Morbid obesity ([place])   . OSA (obstructive sleep apnea)   . Vertigo     Past Surgical History:  Procedure Laterality Date  . COLONOSCOPY    . UMBILICAL HERNIA REPAIR      Current Outpatient Prescriptions  Medication Sig Dispense Refill  . acetaminophen (TYLENOL ARTHRITIS PAIN) 650 MG CR tablet Take 650 mg by mouth every 8 (eight) hours as needed for pain.    Marland Kitchen amLODipine (NORVASC) 10 MG tablet Take 10 mg by mouth daily.    Marland Kitchen aspirin EC 81 MG tablet Take 81 mg by mouth daily.    . clopidogrel (PLAVIX) 75 MG tablet Take 1 tablet by mouth daily.    . insulin aspart (NOVOLOG) 100 UNIT/ML injection Inject 10 Units into the skin 3 (three) times daily before meals.    Marland Kitchen LANTUS SOLOSTAR 100 UNIT/ML Solostar Pen Inject 40 Units into the  skin 2 (two) times daily.    Marland Kitchen losartan-hydrochlorothiazide (HYZAAR) 100-25 MG per tablet Take 1 tablet by mouth daily.    Marland Kitchen ZETIA 10 MG tablet Take 10 mg by mouth daily.      No current facility-administered medications for this visit.     Allergies as of 05/10/2017  . (No Known Allergies)    Family History  Problem Relation Age of Onset  . CVA Mother   . Alzheimer's disease Mother   . Heart attack Father   . Heart disease Father   . Diabetes Brother   . Rectal cancer Brother   . Stomach cancer Neg Hx   . Colon cancer Neg Hx     Social History   Social History  . Marital status: Married    Spouse name: Jacob Avery  . Number of children: 2  . Years of education: hs gr    Occupational History  . retired    Social History Main Topics  . Smoking status: Former Smoker    Quit date: 11/01/2001  . Smokeless tobacco: Never Used  . Alcohol use No  . Drug use: No  . Sexual activity: Not Currently    Partners: Female    Birth control/ protection: None   Other Topics Concern  . Not on file   Social History Narrative   Patient is married with 2 children.   Patient is right handed.   Patient has hs education.   Patient drinks tea on sundays, sodas occ.    Review of Systems:    Constitutional: No weight loss, fever or chills Cardiovascular: No chest pain Respiratory: No SOB  Gastrointestinal: See HPI and otherwise negative Neurological: No headache, dizziness or syncope   Physical Exam:  Vital signs: BP (!) 118/52   Pulse 63   Ht 5' 8.5" (1.74 m) Comment: w/o shoes  Wt 285 lb (129.3 kg)   BMI 42.70 kg/m   Constitutional:   Pleasant obese African American male appears to be in NAD, Well developed, Well nourished, alert and cooperative Respiratory: Respirations even and unlabored. Lungs clear to auscultation bilaterally.   No wheezes, crackles, or rhonchi.  Cardiovascular: Normal S1, S2. No MRG. Regular rate and rhythm. No peripheral edema, cyanosis or pallor.  Gastrointestinal:  Soft, nondistended, nontender. No rebound or guarding. Normal bowel sounds. No appreciable masses or hepatomegaly. Psychiatric:  Demonstrates good judgement and reason without abnormal affect or behaviors.  MOST RECENT LABS AND IMAGING: CBC    Component Value Date/Time   WBC 10.4 05/05/2017 1729   RBC 4.85 05/05/2017 1729   HGB 11.3 (L) 05/05/2017 1729   HCT 37.9 (L) 05/05/2017 1729   PLT 295 05/05/2017 1729   MCV 78.1 05/05/2017 1729   MCH 23.3 (L) 05/05/2017 1729   MCHC 29.8 (L) 05/05/2017 1729   RDW 17.0 (H) 05/05/2017 1729   LYMPHSABS 2.4 10/25/2014 1311   MONOABS 0.6 10/25/2014 1311   EOSABS 0.1 10/25/2014 1311   BASOSABS 0.0 10/25/2014 1311    CMP      Component Value Date/Time   NA 142 05/05/2017 1729   K 3.8 05/05/2017 1729   CL 106 05/05/2017 1729   CO2 28 05/05/2017 1729   GLUCOSE 127 (H) 05/05/2017 1729   BUN 22 (H) 05/05/2017 1729   CREATININE 0.93 05/05/2017 1729   CALCIUM 9.0 05/05/2017 1729   PROT 6.8 05/05/2017 1729   ALBUMIN 3.7 05/05/2017 1729   AST 15 05/05/2017 1729   ALT 17 05/05/2017 1729   ALKPHOS 85  05/05/2017 1729   BILITOT 0.3 05/05/2017 1729   GFRNONAA >60 05/05/2017 1729   GFRAA >60 05/05/2017 1729    Assessment: 1. Rectal bleeding: Patient describes 4 days of rectal bleeding, bright red blood with no abdominal or rectal pain, this started "out of the blue" and ended on its own, hemoglobin has already improved per report, now in the 11's; discussed that most likely this represented diverticular bleed 2. Screening colonoscopy: This was previously not pursued due to difficulty previously in 2006, now with recent bleeding recommend that we pursue this with a smaller scope  Plan: 1. Discussed case with Dr. Carlean Purl, would recommend pursuing screening at this time with an ultra slim scope. This was scheduled at [facility] to use ultra slim scope. Discussed risks, benefits, limitations and alternatives, patient agrees to proceed. 2. Recommend that the patient hold his Plavix for 5 days prior to his procedure. We will communicate with his cardiologist to ensure that holding his Plavix is acceptable. 3. Patient to follow in clinic per recommendations from Dr. Carlean Purl after time of procedure.  Jacob Newer, PA-C [facility] 05/10/2017, 3:51 PM  Cc: [name], Fransico Him, MD   Agree with Ms. Jacob Avery's evaluation and management.  Jacob Mayer, MD, [facility]

## 2017-05-10 NOTE — Telephone Encounter (Signed)
LM for Larene Beach at [facility] to please call me.  He has seen Dr. Osborne Casco in the past.  Im asking if Dr. Osborne Casco prescribes the Plavix.

## 2017-05-11 NOTE — Telephone Encounter (Signed)
Spoke to the patient and advised him per Dr. Osborne Casco, He can stop the Plavix on 8-3 through and including 8-8 and resume the Plavix on 06-09-2017. The patient verbalized understanding the directions.

## 2017-06-07 ENCOUNTER — Encounter (HOSPITAL_COMMUNITY): Payer: Self-pay | Admitting: *Deleted

## 2017-06-08 ENCOUNTER — Encounter (HOSPITAL_COMMUNITY): Payer: Self-pay

## 2017-06-08 ENCOUNTER — Ambulatory Visit (HOSPITAL_COMMUNITY)
Admission: RE | Admit: 2017-06-08 | Discharge: 2017-06-08 | Disposition: A | Discharge status: Home / Self Care | Payer: Medicare Other | Source: Ambulatory Visit | Attending: Internal Medicine | Admitting: Internal Medicine

## 2017-06-08 ENCOUNTER — Ambulatory Visit (HOSPITAL_COMMUNITY): Payer: Medicare Other | Admitting: Anesthesiology

## 2017-06-08 ENCOUNTER — Encounter (HOSPITAL_COMMUNITY)
Admission: RE | Disposition: A | Discharge status: Home / Self Care | Payer: Self-pay | Source: Ambulatory Visit | Attending: Internal Medicine

## 2017-06-08 DIAGNOSIS — Z1211 Encounter for screening for malignant neoplasm of colon: Secondary | ICD-10-CM | POA: Diagnosis not present

## 2017-06-08 DIAGNOSIS — Z87891 Personal history of nicotine dependence: Secondary | ICD-10-CM | POA: Diagnosis not present

## 2017-06-08 DIAGNOSIS — Z7902 Long term (current) use of antithrombotics/antiplatelets: Secondary | ICD-10-CM | POA: Diagnosis not present

## 2017-06-08 DIAGNOSIS — K635 Polyp of colon: Secondary | ICD-10-CM | POA: Diagnosis not present

## 2017-06-08 DIAGNOSIS — D123 Benign neoplasm of transverse colon: Secondary | ICD-10-CM | POA: Diagnosis not present

## 2017-06-08 DIAGNOSIS — Z79899 Other long term (current) drug therapy: Secondary | ICD-10-CM | POA: Insufficient documentation

## 2017-06-08 DIAGNOSIS — K5730 Diverticulosis of large intestine without perforation or abscess without bleeding: Secondary | ICD-10-CM | POA: Insufficient documentation

## 2017-06-08 DIAGNOSIS — G4733 Obstructive sleep apnea (adult) (pediatric): Secondary | ICD-10-CM | POA: Insufficient documentation

## 2017-06-08 DIAGNOSIS — K625 Hemorrhage of anus and rectum: Secondary | ICD-10-CM | POA: Diagnosis not present

## 2017-06-08 DIAGNOSIS — Z7982 Long term (current) use of aspirin: Secondary | ICD-10-CM | POA: Diagnosis not present

## 2017-06-08 DIAGNOSIS — Z8673 Personal history of transient ischemic attack (TIA), and cerebral infarction without residual deficits: Secondary | ICD-10-CM | POA: Insufficient documentation

## 2017-06-08 DIAGNOSIS — Z794 Long term (current) use of insulin: Secondary | ICD-10-CM | POA: Diagnosis not present

## 2017-06-08 DIAGNOSIS — I10 Essential (primary) hypertension: Secondary | ICD-10-CM | POA: Diagnosis not present

## 2017-06-08 DIAGNOSIS — D120 Benign neoplasm of cecum: Secondary | ICD-10-CM

## 2017-06-08 DIAGNOSIS — E7800 Pure hypercholesterolemia, unspecified: Secondary | ICD-10-CM | POA: Insufficient documentation

## 2017-06-08 DIAGNOSIS — D128 Benign neoplasm of rectum: Secondary | ICD-10-CM | POA: Diagnosis not present

## 2017-06-08 DIAGNOSIS — E119 Type 2 diabetes mellitus without complications: Secondary | ICD-10-CM | POA: Diagnosis not present

## 2017-06-08 DIAGNOSIS — K621 Rectal polyp: Secondary | ICD-10-CM | POA: Diagnosis not present

## 2017-06-08 HISTORY — PX: COLONOSCOPY WITH PROPOFOL: SHX5780

## 2017-06-08 LAB — GLUCOSE, CAPILLARY: GLUCOSE-CAPILLARY: 135 mg/dL — AB (ref 65–99)

## 2017-06-08 SURGERY — COLONOSCOPY WITH PROPOFOL
Anesthesia: Monitor Anesthesia Care

## 2017-06-08 SURGERY — Surgical Case
Anesthesia: *Unknown

## 2017-06-08 MED ORDER — EPHEDRINE SULFATE 50 MG/ML IJ SOLN
INTRAMUSCULAR | Status: DC | PRN
  Administered 2017-06-08: 15 mg via INTRAVENOUS

## 2017-06-08 MED ORDER — PROPOFOL 500 MG/50ML IV EMUL
INTRAVENOUS | Status: DC | PRN
  Administered 2017-06-08: 125 ug/kg/min via INTRAVENOUS

## 2017-06-08 MED ORDER — PROPOFOL 10 MG/ML IV BOLUS
INTRAVENOUS | Status: AC
  Filled 2017-06-08: qty 40

## 2017-06-08 MED ORDER — PROPOFOL 500 MG/50ML IV EMUL
INTRAVENOUS | Status: DC | PRN
  Administered 2017-06-08: 60 mg via INTRAVENOUS

## 2017-06-08 MED ORDER — SODIUM CHLORIDE 0.9 % IV SOLN: INTRAVENOUS | Status: DC

## 2017-06-08 MED ORDER — LACTATED RINGERS IV SOLN
INTRAVENOUS | Status: DC
Start: 2017-06-08 — End: 2017-06-08
  Administered 2017-06-08: 1000 mL via INTRAVENOUS

## 2017-06-08 SURGICAL SUPPLY — 21 items

## 2017-06-08 NOTE — Op Note (Signed)
[facility] Patient Name: Jacob Avery Procedure Date: 06/08/2017 MRN: [id number] Attending MD: Gatha Mayer , MD Date of Birth: 1946/04/24 CSN: [id number] Age: 71 Admit Type: Outpatient Procedure:                Colonoscopy Indications:              Rectal bleeding Providers:                Gatha Mayer, MD, Tinnie Gens, Technician,                            Laverta Baltimore RN, RN Referring MD:              Medicines:                Propofol per Anesthesia, Monitored Anesthesia Care Complications:            No immediate complications. Estimated Blood Loss:     Estimated blood loss was minimal. Procedure:                Pre-Anesthesia Assessment:                           - Prior to the procedure, a History and Physical                            was performed, and patient medications and                            allergies were reviewed. The patient's tolerance of                            previous anesthesia was also reviewed. The risks                            and benefits of the procedure and the sedation                            options and risks were discussed with the patient.                            All questions were answered, and informed consent                            was obtained. Prior Anticoagulants: The patient                            last took aspirin 5 days and Plavix (clopidogrel) 5                            days prior to the procedure. ASA Grade Assessment:                            III - A patient with severe systemic disease. After  reviewing the risks and benefits, the patient was                            deemed in satisfactory condition to undergo the                            procedure.                           After obtaining informed consent, the colonoscope                            was passed under direct vision. Throughout the                            procedure, the patient's blood  pressure, pulse, and                            oxygen saturations were monitored continuously. The                            EC-[id number] ([id number]) scope was introduced through                            the anus and advanced to the the cecum, identified                            by appendiceal orifice and ileocecal valve. The                            colonoscopy was performed without difficulty. The                            patient tolerated the procedure well. The quality                            of the bowel preparation was good. The bowel                            preparation used was Miralax. The ileocecal valve,                            appendiceal orifice, and rectum were photographed. Scope In: 1:22:26 PM Scope Out: 1:43:39 PM Scope Withdrawal Time: 0 hours 13 minutes 27 seconds  Total Procedure Duration: 0 hours 21 minutes 13 seconds  Findings:      The perianal and digital rectal examinations were normal. Pertinent       negatives include normal prostate (size, shape, and consistency).      Three sessile and semi-pedunculated polyps were found in the rectum,       transverse colon and cecum. The polyps were 4 to 8 mm in size. These       polyps were removed with a cold snare. Resection and retrieval were       complete. Verification of patient identification for the specimen was  done. Estimated blood loss was minimal.      Many diverticula were found in the sigmoid colon. There was narrowing of       the colon in association with the diverticular opening.      The exam was otherwise without abnormality on direct and retroflexion       views. Impression:               - Three 4 to 8 mm polyps in the rectum, in the                            transverse colon and in the cecum, removed with a                            cold snare. Resected and retrieved.                           - Severe diverticulosis in the sigmoid colon. There                            was  narrowing of the colon in association with the                            diverticular opening.                           - The examination was otherwise normal on direct                            and retroflexion views. Moderate Sedation:      N/A- Per Anesthesia Care Recommendation:           - Patient has a contact number available for                            emergencies. The signs and symptoms of potential                            delayed complications were discussed with the                            patient. Return to normal activities tomorrow.                            Written discharge instructions were provided to the                            patient.                           - Continue present medications.                           - Resume aspirin today and Plavix (clopidogrel)  today at prior doses.                           - Await pathology results.                           - Repeat colonoscopy is recommended. The                            colonoscopy date will be determined after pathology                            results from today's exam become available for                            review.                           - Resume previous diet. Procedure Code(s):        --- Professional ---                           [phone], Colonoscopy, flexible; with removal of                            tumor(s), polyp(s), or other lesion(s) by snare                            technique Diagnosis Code(s):        --- Professional ---                           K62.1, Rectal polyp                           D12.3, Benign neoplasm of transverse colon (hepatic                            flexure or splenic flexure)                           D12.0, Benign neoplasm of cecum                           K62.5, Hemorrhage of anus and rectum                           K57.30, Diverticulosis of large intestine without                            perforation or  abscess without bleeding CPT copyright 2016 American Medical Association. All rights reserved. The codes documented in this report are preliminary and upon coder review may  be revised to meet current compliance requirements. Gatha Mayer, MD 06/08/2017 1:57:24 PM This report has been signed electronically. Number of Addenda: 0

## 2017-06-08 NOTE — Anesthesia Preprocedure Evaluation (Signed)
Anesthesia Evaluation  Patient identified by MRN, date of birth, ID band Patient awake    Reviewed: Allergy & Precautions, NPO status , Patient's Chart, lab work & pertinent test results  Airway Mallampati: II  TM Distance: >3 FB Neck ROM: Full    Dental no notable dental hx.    Pulmonary sleep apnea , former smoker,    Pulmonary exam normal breath sounds clear to auscultation       Cardiovascular hypertension, Normal cardiovascular exam Rhythm:Regular Rate:Normal     Neuro/Psych CVA negative psych ROS   GI/Hepatic negative GI ROS, Neg liver ROS,   Endo/Other  diabetes  Renal/GU negative Renal ROS  negative genitourinary   Musculoskeletal negative musculoskeletal ROS (+)   Abdominal   Peds negative pediatric ROS (+)  Hematology negative hematology ROS (+)   Anesthesia Other Findings   Reproductive/Obstetrics negative OB ROS                             Anesthesia Physical Anesthesia Plan  ASA: III  Anesthesia Plan: MAC   Post-op Pain Management:    Induction: Intravenous  PONV Risk Score and Plan: 0  Airway Management Planned: Simple Face Mask  Additional Equipment:   Intra-op Plan:   Post-operative Plan:   Informed Consent: I have reviewed the patients History and Physical, chart, labs and discussed the procedure including the risks, benefits and alternatives for the proposed anesthesia with the patient or authorized representative who has indicated his/her understanding and acceptance.   Dental advisory given  Plan Discussed with: CRNA and Surgeon  Anesthesia Plan Comments:         Anesthesia Quick Evaluation

## 2017-06-08 NOTE — H&P (View-Only) (Signed)
Chief Complaint: Rectal Bleeding  HPI:   Jacob Avery is a 70 year old African-American male with a past medical history of CVA on Plavix, diabetes, hypertension, morbid obesity and obstructive sleep apnea,  who was referred to me by [name], Fransico Him, MD for a complaint of rectal bleeding .     Patient had recently seen Dr. Carlean Purl on 02/02/17 to discuss colon cancer screening. At that time, it was noted that in 2006 the patient had an incomplete colonoscopy due to severe sigmoid diverticulosis. He then had a CT colonoscopy in 2011 which showed severe diverticulosis but the rectosigmoid colon could not be evaluated and there was a suspected polypoid defect in the right rectal wall. A flexible sigmoidoscopy was then done and negative for polyps. Since that time he had annual Hemoccults which were negative. It was recommended that he continue to have annual Hemoccults and as long as there were negative and would not push for further screening.    CBC 05/05/17  Shows hgb 11.1.     Today, the patient presented to clinic accompanied by his wife and tells me that on Monday night of last week, 05/02/17 he started with bright red blood per rectum. He tells me that he had 2-3 bowel movements at night which were just blood and no stool and this continued into Tuesday and Wednesday and he had 3 further "blood movements" those days, on Thursday he went to see his primary care provider and that morning did have some stool with just a small amount of bright red blood, they sent him to the ER. The ER told him that he had a hemoglobin drop of "3 g". Patient explains that they wanted to admit him but he didn't want to stay. Apparently they checked for orthostatic hypotension and he did not have this so they told him to go home and have a recheck of his labs on Friday. Most recent CBC was as above with a hemoglobin of 11.1. Per his PCP this was an improvement to prior. We do not have previous labs. Patient tells me he has seen  no further blood since Thursday of last week. He denies any abdominal pain or rectal pain.   Patient denies fever, chills, change in bowel habits, weight loss, anorexia, nausea, vomiting, heartburn, reflux or abdominal pain.  Past Medical History:  Diagnosis Date  . Arthritis   . CVA (cerebral vascular accident) ([place])   . Diabetes mellitus without complication ([place])   . Diverticulosis   . ED (erectile dysfunction)   . Gout   . Hypercholesterolemia   . Hypertension   . Morbid obesity ([place])   . OSA (obstructive sleep apnea)   . Vertigo     Past Surgical History:  Procedure Laterality Date  . COLONOSCOPY    . UMBILICAL HERNIA REPAIR      Current Outpatient Prescriptions  Medication Sig Dispense Refill  . acetaminophen (TYLENOL ARTHRITIS PAIN) 650 MG CR tablet Take 650 mg by mouth every 8 (eight) hours as needed for pain.    Marland Kitchen amLODipine (NORVASC) 10 MG tablet Take 10 mg by mouth daily.    Marland Kitchen aspirin EC 81 MG tablet Take 81 mg by mouth daily.    . clopidogrel (PLAVIX) 75 MG tablet Take 1 tablet by mouth daily.    . insulin aspart (NOVOLOG) 100 UNIT/ML injection Inject 10 Units into the skin 3 (three) times daily before meals.    Marland Kitchen LANTUS SOLOSTAR 100 UNIT/ML Solostar Pen Inject 40 Units into the  skin 2 (two) times daily.    Marland Kitchen losartan-hydrochlorothiazide (HYZAAR) 100-25 MG per tablet Take 1 tablet by mouth daily.    Marland Kitchen ZETIA 10 MG tablet Take 10 mg by mouth daily.      No current facility-administered medications for this visit.     Allergies as of 05/10/2017  . (No Known Allergies)    Family History  Problem Relation Age of Onset  . CVA Mother   . Alzheimer's disease Mother   . Heart attack Father   . Heart disease Father   . Diabetes Brother   . Rectal cancer Brother   . Stomach cancer Neg Hx   . Colon cancer Neg Hx     Social History   Social History  . Marital status: Married    Spouse name: peggie  . Number of children: 2  . Years of education: hs gr    Occupational History  . retired    Social History Main Topics  . Smoking status: Former Smoker    Quit date: 11/01/2001  . Smokeless tobacco: Never Used  . Alcohol use No  . Drug use: No  . Sexual activity: Not Currently    Partners: Female    Birth control/ protection: None   Other Topics Concern  . Not on file   Social History Narrative   Patient is married with 2 children.   Patient is right handed.   Patient has hs education.   Patient drinks tea on sundays, sodas occ.    Review of Systems:    Constitutional: No weight loss, fever or chills Cardiovascular: No chest pain Respiratory: No SOB  Gastrointestinal: See HPI and otherwise negative Neurological: No headache, dizziness or syncope   Physical Exam:  Vital signs: BP (!) 118/52   Pulse 63   Ht 5' 8.5" (1.74 m) Comment: w/o shoes  Wt 285 lb (129.3 kg)   BMI 42.70 kg/m   Constitutional:   Pleasant obese African American male appears to be in NAD, Well developed, Well nourished, alert and cooperative Respiratory: Respirations even and unlabored. Lungs clear to auscultation bilaterally.   No wheezes, crackles, or rhonchi.  Cardiovascular: Normal S1, S2. No MRG. Regular rate and rhythm. No peripheral edema, cyanosis or pallor.  Gastrointestinal:  Soft, nondistended, nontender. No rebound or guarding. Normal bowel sounds. No appreciable masses or hepatomegaly. Psychiatric:  Demonstrates good judgement and reason without abnormal affect or behaviors.  MOST RECENT LABS AND IMAGING: CBC    Component Value Date/Time   WBC 10.4 05/05/2017 1729   RBC 4.85 05/05/2017 1729   HGB 11.3 (L) 05/05/2017 1729   HCT 37.9 (L) 05/05/2017 1729   PLT 295 05/05/2017 1729   MCV 78.1 05/05/2017 1729   MCH 23.3 (L) 05/05/2017 1729   MCHC 29.8 (L) 05/05/2017 1729   RDW 17.0 (H) 05/05/2017 1729   LYMPHSABS 2.4 10/25/2014 1311   MONOABS 0.6 10/25/2014 1311   EOSABS 0.1 10/25/2014 1311   BASOSABS 0.0 10/25/2014 1311    CMP      Component Value Date/Time   NA 142 05/05/2017 1729   K 3.8 05/05/2017 1729   CL 106 05/05/2017 1729   CO2 28 05/05/2017 1729   GLUCOSE 127 (H) 05/05/2017 1729   BUN 22 (H) 05/05/2017 1729   CREATININE 0.93 05/05/2017 1729   CALCIUM 9.0 05/05/2017 1729   PROT 6.8 05/05/2017 1729   ALBUMIN 3.7 05/05/2017 1729   AST 15 05/05/2017 1729   ALT 17 05/05/2017 1729   ALKPHOS 85  05/05/2017 1729   BILITOT 0.3 05/05/2017 1729   GFRNONAA >60 05/05/2017 1729   GFRAA >60 05/05/2017 1729    Assessment: 1. Rectal bleeding: Patient describes 4 days of rectal bleeding, bright red blood with no abdominal or rectal pain, this started "out of the blue" and ended on its own, hemoglobin has already improved per report, now in the 11's; discussed that most likely this represented diverticular bleed 2. Screening colonoscopy: This was previously not pursued due to difficulty previously in 2006, now with recent bleeding recommend that we pursue this with a smaller scope  Plan: 1. Discussed case with Dr. Carlean Purl, would recommend pursuing screening at this time with an ultra slim scope. This was scheduled at [facility] to use ultra slim scope. Discussed risks, benefits, limitations and alternatives, patient agrees to proceed. 2. Recommend that the patient hold his Plavix for 5 days prior to his procedure. We will communicate with his cardiologist to ensure that holding his Plavix is acceptable. 3. Patient to follow in clinic per recommendations from Dr. Carlean Purl after time of procedure.  Ellouise Newer, PA-C [facility] 05/10/2017, 3:51 PM  Cc: [name], Fransico Him, MD   Agree with Ms. Lemmon's evaluation and management.  Gatha Mayer, MD, [facility]

## 2017-06-08 NOTE — Interval H&P Note (Signed)
History and Physical Interval Note:  06/08/2017 1:06 PM  Jacob Avery  has presented today for surgery, with the diagnosis of Rectal bleeding, Colon cancer screening.  The various methods of treatment have been discussed with the patient and family. After consideration of risks, benefits and other options for treatment, the patient has consented to  Procedure(s): COLONOSCOPY WITH PROPOFOL ( Ultra-Slim Scope) (N/A) as a surgical intervention .  The patient's history has been reviewed, patient examined, no change in status, stable for surgery.  I have reviewed the patient's chart and labs.  Questions were answered to the patient's satisfaction.     Silvano Rusk

## 2017-06-08 NOTE — Transfer of Care (Signed)
Immediate Anesthesia Transfer of Care Note  Patient: Jacob Avery  Procedure(s) Performed: Procedure(s): COLONOSCOPY WITH PROPOFOL ( Ultra-Slim Scope) (N/A)  Patient Location: PACU  Anesthesia Type:MAC  Level of Consciousness: awake, alert  and oriented  Airway & Oxygen Therapy: Patient Spontanous Breathing and Patient connected to nasal cannula oxygen  Post-op Assessment: Report given to RN and Post -op Vital signs reviewed and stable  Post vital signs: Reviewed and stable  Last Vitals:  Vitals:   06/08/17 1203  BP: (!) 145/55  Pulse: (!) 50  Resp: 17  Temp: 36.7 C    Last Pain:  Vitals:   06/08/17 1203  TempSrc: Oral         Complications: No apparent anesthesia complications

## 2017-06-09 ENCOUNTER — Encounter (HOSPITAL_COMMUNITY): Payer: Self-pay | Admitting: Internal Medicine

## 2017-06-09 NOTE — Anesthesia Postprocedure Evaluation (Signed)
Anesthesia Post Note  Patient: Jacob Avery  Procedure(s) Performed: Procedure(s) (LRB): COLONOSCOPY WITH PROPOFOL ( Ultra-Slim Scope) (N/A)     Patient location during evaluation: PACU Anesthesia Type: MAC Level of consciousness: awake and alert Pain management: pain level controlled Vital Signs Assessment: post-procedure vital signs reviewed and stable Respiratory status: spontaneous breathing, nonlabored ventilation, respiratory function stable and patient connected to nasal cannula oxygen Cardiovascular status: stable and blood pressure returned to baseline Anesthetic complications: no    Last Vitals:  Vitals:   06/08/17 1400 06/08/17 1410  BP: 136/63 (!) 125/57  Pulse: (!) 50 60  Resp: 16 17  Temp:    SpO2: 99% 98%    Last Pain:  Vitals:   06/08/17 1354  TempSrc: Oral                 Tagen Milby S

## 2017-06-10 ENCOUNTER — Encounter: Payer: Self-pay | Admitting: Internal Medicine

## 2017-06-10 DIAGNOSIS — Z860101 Personal history of adenomatous and serrated colon polyps: Secondary | ICD-10-CM

## 2017-06-10 DIAGNOSIS — Z86010 Personal history of colonic polyps: Secondary | ICD-10-CM

## 2017-06-10 HISTORY — DX: Personal history of colonic polyps: Z86.010

## 2017-06-10 HISTORY — DX: Personal history of adenomatous and serrated colon polyps: Z86.0101

## 2017-06-10 NOTE — Progress Notes (Signed)
8-10 mm rectal adenoma w/ 2 diminutive right hyperplastic polyps Recall 2023

## 2017-08-03 DIAGNOSIS — I69998 Other sequelae following unspecified cerebrovascular disease: Secondary | ICD-10-CM | POA: Diagnosis not present

## 2017-08-03 DIAGNOSIS — E1129 Type 2 diabetes mellitus with other diabetic kidney complication: Secondary | ICD-10-CM | POA: Diagnosis not present

## 2017-08-03 DIAGNOSIS — K5790 Diverticulosis of intestine, part unspecified, without perforation or abscess without bleeding: Secondary | ICD-10-CM | POA: Diagnosis not present

## 2017-08-03 DIAGNOSIS — I10 Essential (primary) hypertension: Secondary | ICD-10-CM | POA: Diagnosis not present

## 2017-08-03 DIAGNOSIS — E7800 Pure hypercholesterolemia, unspecified: Secondary | ICD-10-CM | POA: Diagnosis not present

## 2017-08-03 DIAGNOSIS — Z6841 Body Mass Index (BMI) 40.0 and over, adult: Secondary | ICD-10-CM | POA: Diagnosis not present

## 2017-08-03 DIAGNOSIS — Z794 Long term (current) use of insulin: Secondary | ICD-10-CM | POA: Diagnosis not present

## 2017-08-03 DIAGNOSIS — Z23 Encounter for immunization: Secondary | ICD-10-CM | POA: Diagnosis not present

## 2017-08-03 DIAGNOSIS — M109 Gout, unspecified: Secondary | ICD-10-CM | POA: Diagnosis not present

## 2017-08-03 DIAGNOSIS — G4733 Obstructive sleep apnea (adult) (pediatric): Secondary | ICD-10-CM | POA: Diagnosis not present

## 2017-08-03 DIAGNOSIS — R808 Other proteinuria: Secondary | ICD-10-CM | POA: Diagnosis not present

## 2017-08-10 DIAGNOSIS — E1129 Type 2 diabetes mellitus with other diabetic kidney complication: Secondary | ICD-10-CM | POA: Diagnosis not present

## 2017-08-10 DIAGNOSIS — Z6841 Body Mass Index (BMI) 40.0 and over, adult: Secondary | ICD-10-CM | POA: Diagnosis not present

## 2017-08-10 DIAGNOSIS — Z794 Long term (current) use of insulin: Secondary | ICD-10-CM | POA: Diagnosis not present

## 2017-08-10 DIAGNOSIS — I10 Essential (primary) hypertension: Secondary | ICD-10-CM | POA: Diagnosis not present

## 2017-12-07 DIAGNOSIS — Z125 Encounter for screening for malignant neoplasm of prostate: Secondary | ICD-10-CM | POA: Diagnosis not present

## 2017-12-07 DIAGNOSIS — I10 Essential (primary) hypertension: Secondary | ICD-10-CM | POA: Diagnosis not present

## 2017-12-07 DIAGNOSIS — R82998 Other abnormal findings in urine: Secondary | ICD-10-CM | POA: Diagnosis not present

## 2017-12-07 DIAGNOSIS — E7800 Pure hypercholesterolemia, unspecified: Secondary | ICD-10-CM | POA: Diagnosis not present

## 2017-12-07 DIAGNOSIS — M109 Gout, unspecified: Secondary | ICD-10-CM | POA: Diagnosis not present

## 2017-12-07 DIAGNOSIS — E1129 Type 2 diabetes mellitus with other diabetic kidney complication: Secondary | ICD-10-CM | POA: Diagnosis not present

## 2017-12-14 DIAGNOSIS — Z794 Long term (current) use of insulin: Secondary | ICD-10-CM | POA: Diagnosis not present

## 2017-12-14 DIAGNOSIS — I10 Essential (primary) hypertension: Secondary | ICD-10-CM | POA: Diagnosis not present

## 2017-12-14 DIAGNOSIS — G4733 Obstructive sleep apnea (adult) (pediatric): Secondary | ICD-10-CM | POA: Diagnosis not present

## 2017-12-14 DIAGNOSIS — I6389 Other cerebral infarction: Secondary | ICD-10-CM | POA: Diagnosis not present

## 2017-12-14 DIAGNOSIS — I69998 Other sequelae following unspecified cerebrovascular disease: Secondary | ICD-10-CM | POA: Diagnosis not present

## 2017-12-14 DIAGNOSIS — Z1389 Encounter for screening for other disorder: Secondary | ICD-10-CM | POA: Diagnosis not present

## 2017-12-14 DIAGNOSIS — Z6841 Body Mass Index (BMI) 40.0 and over, adult: Secondary | ICD-10-CM | POA: Diagnosis not present

## 2017-12-14 DIAGNOSIS — F5221 Male erectile disorder: Secondary | ICD-10-CM | POA: Diagnosis not present

## 2017-12-14 DIAGNOSIS — Z0000 Encounter for general adult medical examination without abnormal findings: Secondary | ICD-10-CM | POA: Diagnosis not present

## 2017-12-14 DIAGNOSIS — E1129 Type 2 diabetes mellitus with other diabetic kidney complication: Secondary | ICD-10-CM | POA: Diagnosis not present

## 2017-12-14 DIAGNOSIS — N182 Chronic kidney disease, stage 2 (mild): Secondary | ICD-10-CM | POA: Insufficient documentation

## 2017-12-14 DIAGNOSIS — E7800 Pure hypercholesterolemia, unspecified: Secondary | ICD-10-CM | POA: Diagnosis not present

## 2017-12-15 DIAGNOSIS — Z1212 Encounter for screening for malignant neoplasm of rectum: Secondary | ICD-10-CM | POA: Diagnosis not present

## 2018-01-25 DIAGNOSIS — N182 Chronic kidney disease, stage 2 (mild): Secondary | ICD-10-CM | POA: Diagnosis not present

## 2018-01-25 DIAGNOSIS — I10 Essential (primary) hypertension: Secondary | ICD-10-CM | POA: Diagnosis not present

## 2018-01-25 DIAGNOSIS — Z794 Long term (current) use of insulin: Secondary | ICD-10-CM | POA: Diagnosis not present

## 2018-01-25 DIAGNOSIS — E1129 Type 2 diabetes mellitus with other diabetic kidney complication: Secondary | ICD-10-CM | POA: Diagnosis not present

## 2018-01-25 DIAGNOSIS — Z6841 Body Mass Index (BMI) 40.0 and over, adult: Secondary | ICD-10-CM | POA: Diagnosis not present

## 2018-02-20 DIAGNOSIS — E1129 Type 2 diabetes mellitus with other diabetic kidney complication: Secondary | ICD-10-CM | POA: Diagnosis not present

## 2018-03-15 DIAGNOSIS — Z794 Long term (current) use of insulin: Secondary | ICD-10-CM | POA: Diagnosis not present

## 2018-03-15 DIAGNOSIS — Z6841 Body Mass Index (BMI) 40.0 and over, adult: Secondary | ICD-10-CM | POA: Diagnosis not present

## 2018-03-15 DIAGNOSIS — R808 Other proteinuria: Secondary | ICD-10-CM | POA: Diagnosis not present

## 2018-03-15 DIAGNOSIS — I10 Essential (primary) hypertension: Secondary | ICD-10-CM | POA: Diagnosis not present

## 2018-03-15 DIAGNOSIS — G4733 Obstructive sleep apnea (adult) (pediatric): Secondary | ICD-10-CM | POA: Diagnosis not present

## 2018-03-15 DIAGNOSIS — R718 Other abnormality of red blood cells: Secondary | ICD-10-CM | POA: Diagnosis not present

## 2018-03-15 DIAGNOSIS — E1129 Type 2 diabetes mellitus with other diabetic kidney complication: Secondary | ICD-10-CM | POA: Diagnosis not present

## 2018-03-15 DIAGNOSIS — N182 Chronic kidney disease, stage 2 (mild): Secondary | ICD-10-CM | POA: Diagnosis not present

## 2018-03-15 DIAGNOSIS — I6789 Other cerebrovascular disease: Secondary | ICD-10-CM | POA: Diagnosis not present

## 2018-03-15 DIAGNOSIS — E7800 Pure hypercholesterolemia, unspecified: Secondary | ICD-10-CM | POA: Diagnosis not present

## 2018-03-15 DIAGNOSIS — M109 Gout, unspecified: Secondary | ICD-10-CM | POA: Diagnosis not present

## 2018-04-04 ENCOUNTER — Other Ambulatory Visit (HOSPITAL_COMMUNITY): Payer: Self-pay | Admitting: Internal Medicine

## 2018-04-04 ENCOUNTER — Ambulatory Visit (HOSPITAL_BASED_OUTPATIENT_CLINIC_OR_DEPARTMENT_OTHER)
Admission: RE | Admit: 2018-04-04 | Discharge: 2018-04-04 | Disposition: A | Discharge status: Home / Self Care | Payer: Medicare Other | Source: Ambulatory Visit

## 2018-04-04 ENCOUNTER — Ambulatory Visit (HOSPITAL_COMMUNITY)
Admission: RE | Admit: 2018-04-04 | Discharge: 2018-04-04 | Disposition: A | Discharge status: Home / Self Care | Payer: Medicare Other | Source: Ambulatory Visit | Attending: Internal Medicine | Admitting: Internal Medicine

## 2018-04-04 ENCOUNTER — Encounter (HOSPITAL_COMMUNITY): Payer: Medicare Other

## 2018-04-04 DIAGNOSIS — E1151 Type 2 diabetes mellitus with diabetic peripheral angiopathy without gangrene: Secondary | ICD-10-CM | POA: Diagnosis not present

## 2018-04-04 DIAGNOSIS — R9389 Abnormal findings on diagnostic imaging of other specified body structures: Secondary | ICD-10-CM | POA: Diagnosis not present

## 2018-04-04 DIAGNOSIS — I739 Peripheral vascular disease, unspecified: Secondary | ICD-10-CM

## 2018-04-04 DIAGNOSIS — I10 Essential (primary) hypertension: Secondary | ICD-10-CM | POA: Insufficient documentation

## 2018-04-04 DIAGNOSIS — E785 Hyperlipidemia, unspecified: Secondary | ICD-10-CM | POA: Insufficient documentation

## 2018-04-04 DIAGNOSIS — I70203 Unspecified atherosclerosis of native arteries of extremities, bilateral legs: Secondary | ICD-10-CM | POA: Diagnosis not present

## 2018-04-04 DIAGNOSIS — I771 Stricture of artery: Secondary | ICD-10-CM | POA: Insufficient documentation

## 2018-04-04 NOTE — Progress Notes (Signed)
VASCULAR LAB PRELIMINARY  Bilateral arterial duplex completed. Bilateral common femoral arteries are monophasic, suggestive of possible aorto-iliac disease. The right distal superficial femoral artery and dorsalis pedis artery are occluded. The left distal superficial artery has a 70-99% stenosis.  ABI completed:   RIGHT    LEFT    PRESSURE WAVEFORM  PRESSURE WAVEFORM  BRACHIAL 132 Triphasic BRACHIAL 149 Triphasic  DP  Absent DP 104 Monophasic  AT   AT    PT  Absent PT 96 Monophasic  PER   PER    GREAT TOE  Absent GREAT TOE 74 NA    RIGHT LEFT  ABI 0.00 0.70   Absent right pedal and toe waveforms, therefore cannot calculate ABI or TBI. The left ABI is suggestive of moderate arterial insufficiency at rest. The left TBI is abnormal.  Preliminary results discussed with Dr. Osborne Casco.                             04/04/2018 3:44 PM Maudry Mayhew, BS, RVT, RDCS, RDMS

## 2018-04-05 DIAGNOSIS — M79661 Pain in right lower leg: Secondary | ICD-10-CM

## 2018-04-05 DIAGNOSIS — E118 Type 2 diabetes mellitus with unspecified complications: Secondary | ICD-10-CM

## 2018-04-05 HISTORY — DX: Pain in right lower leg: M79.661

## 2018-04-05 HISTORY — DX: Type 2 diabetes mellitus with unspecified complications: E11.8

## 2018-04-10 ENCOUNTER — Ambulatory Visit (INDEPENDENT_AMBULATORY_CARE_PROVIDER_SITE_OTHER): Payer: Medicare Other | Admitting: Vascular Surgery

## 2018-04-10 ENCOUNTER — Other Ambulatory Visit: Payer: Self-pay | Admitting: *Deleted

## 2018-04-10 ENCOUNTER — Encounter: Payer: Self-pay | Admitting: Vascular Surgery

## 2018-04-10 ENCOUNTER — Encounter: Payer: Self-pay | Admitting: *Deleted

## 2018-04-10 DIAGNOSIS — I70229 Atherosclerosis of native arteries of extremities with rest pain, unspecified extremity: Secondary | ICD-10-CM | POA: Insufficient documentation

## 2018-04-10 DIAGNOSIS — I998 Other disorder of circulatory system: Secondary | ICD-10-CM | POA: Diagnosis not present

## 2018-04-10 NOTE — H&P (View-Only) (Signed)
Requested by:  [name], [place] [place], [place] [address]  Reason for consultation: right foot ischemia   History of Present Illness   Jacob Avery is a 72 y.o. (11-Jun-1946) male who presents with chief complaint: right leg wound.  Onset of symptoms occurred 3-4 weeks ago.  Pain is described as aching, severity 1-5/10, and associated with rest and ambulation.  Patient has had intermittent claudication for one year.  Patient has attempted to treat this pain with rest.  The patient has rest pain symptoms.  The patient has developed over the last 3 weeks some ulcers on calf, dorsum of foot and R 5th toe tip.  Atherosclerotic risk factors include: DM, HLD, HTN and prior smoker.  Past Medical History:  Diagnosis Date  . Arthritis   . CVA (cerebral vascular accident) ([place])    no deficits   . Diabetes mellitus without complication ([place])    type II   . Diverticulosis   . ED (erectile dysfunction)   . Gout   . Hx of adenomatous polyp of rectum 06/10/2017  . Hypercholesterolemia   . Hypertension   . Morbid obesity ([place])   . OSA (obstructive sleep apnea)    no cpap  . Vertigo     Past Surgical History:  Procedure Laterality Date  . COLONOSCOPY    . COLONOSCOPY WITH PROPOFOL N/A 06/08/2017   Procedure: COLONOSCOPY WITH PROPOFOL ( Ultra-Slim Scope);  Surgeon: Gatha Mayer, MD;  Location: [facility];  Service: Endoscopy;  Laterality: N/A;  . UMBILICAL HERNIA REPAIR      Social History   Socioeconomic History  . Marital status: Married    Spouse name: peggie  . Number of children: 2  . Years of education: hs gr  . Highest education level: Not on file  Occupational History  . Occupation: retired  Scientific laboratory technician  . Financial resource strain: Not on file  . Food insecurity:    Worry: Not on file    Inability: Not on file  . Transportation needs:    Medical: Not on file    Non-medical: Not on file  Tobacco Use  . Smoking status: Former Smoker    Last  attempt to quit: 11/01/2001    Years since quitting: 16.4  . Smokeless tobacco: Never Used  Substance and Sexual Activity  . Alcohol use: No    Alcohol/week: 0.0 oz  . Drug use: No  . Sexual activity: Not Currently    Partners: Female    Birth control/protection: None  Lifestyle  . Physical activity:    Days per week: Not on file    Minutes per session: Not on file  . Stress: Not on file  Relationships  . Social connections:    Talks on phone: Not on file    Gets together: Not on file    Attends religious service: Not on file    Active member of club or organization: Not on file    Attends meetings of clubs or organizations: Not on file    Relationship status: Not on file  . Intimate partner violence:    Fear of current or ex partner: Not on file    Emotionally abused: Not on file    Physically abused: Not on file    Forced sexual activity: Not on file  Other Topics Concern  . Not on file  Social History Narrative   Patient is married with 2 children.   Patient is right handed.   Patient  has hs education.   Patient drinks tea on sundays, sodas occ.    Family History  Problem Relation Age of Onset  . CVA Mother   . Alzheimer's disease Mother   . Heart attack Father   . Heart disease Father   . Diabetes Brother   . Rectal cancer Brother   . Stomach cancer Neg Hx   . Colon cancer Neg Hx     Current Outpatient Medications  Medication Sig Dispense Refill  . aspirin EC 81 MG tablet Take 81 mg by mouth daily.    . clopidogrel (PLAVIX) 75 MG tablet Take 1 tablet by mouth daily.    Marland Kitchen ezetimibe (ZETIA) 10 MG tablet Take 10 mg by mouth daily.    . insulin aspart (NOVOLOG) 100 UNIT/ML injection Inject 10 Units into the skin 3 (three) times daily before meals.    Marland Kitchen LANTUS SOLOSTAR 100 UNIT/ML Solostar Pen Inject 35 Units into the skin 2 (two) times daily.     Marland Kitchen losartan-hydrochlorothiazide (HYZAAR) 100-25 MG per tablet Take 1 tablet by mouth daily.    Marland Kitchen acetaminophen  (TYLENOL ARTHRITIS PAIN) 650 MG CR tablet Take 1,300 mg by mouth every 8 (eight) hours as needed for pain.     Marland Kitchen amLODipine (NORVASC) 10 MG tablet Take 10 mg by mouth daily.    . Oxymetazoline HCl (VICKS SINEX 12 HOUR NA) Place 1 spray into both nostrils daily as needed (congestion).     No current facility-administered medications for this visit.     No Known Allergies  REVIEW OF SYSTEMS (negative unless checked):   Cardiac:  []  Chest pain or chest pressure? []  Shortness of breath upon activity? []  Shortness of breath when lying flat? []  Irregular heart rhythm?  Vascular:  [x]  Pain in calf, thigh, or hip brought on by walking? [x]  Pain in feet at night that wakes you up from your sleep? []  Blood clot in your veins? []  Leg swelling?  Pulmonary:  []  Oxygen at home? []  Productive cough? []  Wheezing?  Neurologic:  []  Sudden weakness in arms or legs? []  Sudden numbness in arms or legs? []  Sudden onset of difficult speaking or slurred speech? []  Temporary loss of vision in one eye? []  Problems with dizziness?  Gastrointestinal:  []  Blood in stool? []  Vomited blood?  Genitourinary:  []  Burning when urinating? []  Blood in urine?  Psychiatric:  []  Major depression  Hematologic:  []  Bleeding problems? []  Problems with blood clotting?  Dermatologic:  [x]  Rashes or ulcers?  Constitutional:  []  Fever or chills?  Ear/Nose/Throat:  []  Change in hearing? []  Nose bleeds? []  Sore throat?  Musculoskeletal:  []  Back pain? []  Joint pain? []  Muscle pain?   For VQI Use Only   PRE-ADM LIVING Home  AMB STATUS Ambulatory  CAD Sx None  PRIOR CHF None  STRESS TEST No    Physical Examination     Vitals:   04/10/18 1333  BP: (!) 160/74  Pulse: (!) 53  Resp: 20  SpO2: 96%  Weight: 281 lb (127.5 kg)  Height: 5' 8.5" (1.74 m)   Body mass index is 42.1 kg/m.  General Alert, O x 3, Obese, NAD  Head [place]/AT,    Ear/Nose/ Throat Hearing grossly intact, nares  without erythema or drainage, oropharynx without Erythema or Exudate, Mallampati score: 3,   Eyes PERRLA, EOMI,    Neck Supple, mid-line trachea,    Pulmonary Sym exp, good B air movt, CTA B  Cardiac RRR, Nl S1, S2,  no Murmurs, No rubs, No S3,S4  Vascular Vessel Right Left  Radial Palpable Palpable  Brachial Palpable Palpable  Carotid Palpable, No Bruit Palpable, No Bruit  Aorta Not palpable N/A  Femoral Not palpable due to pannus Not palpable due to pannus  Popliteal Not palpable Not palpable  PT Not palpable Not palpable  DP Not palpable Not palpable    Gastro- intestinal soft, non-distended, non-tender to palpation, No guarding or rebound, no HSM, no masses, no CVAT B, No palpable prominent aortic pulse,    Musculo- skeletal M/S 5/5 throughout  , Extremities without ischemic changes except R ant shin: ischemic ulceration, clean, 3 mm in diameter, dorsum of R foot: clean, ulcer 2-3 mm in diameter, tip of R 5th toe: 1-2 mm ulcer, clean without drainage  , No edema present, No visible varicosities , No Lipodermatosclerosis present  Neurologic Cranial nerves 2-12 intact , Pain and light touch intact in extremities , Motor exam as listed above  Psychiatric Judgement intact, Mood & affect appropriate for pt's clinical situation  Dermatologic See M/S exam for extremity exam, No rashes otherwise noted  Lymphatic  Palpable lymph nodes: None    Non-Invasive Vascular Imaging   ABI (04/04/18)  R:   ABI: No waveforms present,   PT: none  DP: none  TBI:  None  L:   ABI: 0.70,   PT: mono  DP: mono  TBI: 0.50  BLE Arterial Duplex (04/10/2018)  R:  Monophasic throughout except distal SFA occlusion, AT damp mono, ATA distal occluded, DP occluded  L:  Monophasic throughout   Medical Decision Making   Jacob Avery is a 72 y.o. male who presents with: RLE critical limb ischemia, LLE mod PAD   Pt has intact sensation and motor in R leg so he does NOT have threatened  limb.  RLE arterial duplex also demonstrates collateral flow in R PT and peroneal arteries.  The monophasic flow in both CFA suggests inflow disease also.  I discussed with the patient the natural history of critical limb ischemia: 25% require amputation in one year, 50% are able to maintain their limbs in one year, and 25-30% die in one year due to comorbidities.  Given the limb threatening status of this patient, I recommend an aggressive work up including proceeding with an: Aortogram, Bilateral runoff and possible intervention RLE.   . I discussed with the patient the possibility of left brachial artery access for diagnostic purposes if iliac arterial system is occluded. . I discussed with the patient the nature of angiographic procedures, especially the limited patencies of any endovascular intervention. . The patient is aware of that the risks of an angiographic procedure include but are not limited to: bleeding, infection, access site complications, embolization, rupture of treated vessel, dissection, possible need for emergent surgical intervention, and possible need for surgical procedures to treat the patient's pathology. . The patient is aware of the risks and agrees to proceed.  The procedure is scheduled for: 13 JUN 19.  I discussed in depth with the patient the nature of atherosclerosis, and emphasized the importance of maximal medical management including strict control of blood pressure, blood glucose, and lipid levels, antiplatelet agents, obtaining regular exercise, and cessation of smoking.  The patient is aware that without maximal medical management the underlying atherosclerotic disease process will progress, limiting the benefit of any interventions.  The patient is currently not on on statin as not medically indicated.   The patient is currently on an anti-platelet: ASA and Plavix.  Thank you for allowing [place] to participate in this patient's care.   Adele Barthel, MD,  FACS [facility] Office: [phone] Pager: [phone]  04/10/2018, 3:37 PM

## 2018-04-10 NOTE — Progress Notes (Signed)
Requested by:  [name], [address] [place], [place] [address]  Reason for consultation: right foot ischemia   History of Present Illness   Jacob Avery is a 72 y.o. ([birth year]) male who presents with chief complaint: right leg wound.  Onset of symptoms occurred 3-4 weeks ago.  Pain is described as aching, severity 1-5/10, and associated with rest and ambulation.  Patient has had intermittent claudication for one year.  Patient has attempted to treat this pain with rest.  The patient has rest pain symptoms.  The patient has developed over the last 3 weeks some ulcers on calf, dorsum of foot and R 5th toe tip.  Atherosclerotic risk factors include: DM, HLD, HTN and prior smoker.  Past Medical History:  Diagnosis Date  . Arthritis   . CVA (cerebral vascular accident) ([place])    no deficits   . Diabetes mellitus without complication ([place])    type II   . Diverticulosis   . ED (erectile dysfunction)   . Gout   . Hx of adenomatous polyp of rectum 06/10/2017  . Hypercholesterolemia   . Hypertension   . Morbid obesity ([place])   . OSA (obstructive sleep apnea)    no cpap  . Vertigo     Past Surgical History:  Procedure Laterality Date  . COLONOSCOPY    . COLONOSCOPY WITH PROPOFOL N/A 06/08/2017   Procedure: COLONOSCOPY WITH PROPOFOL ( Ultra-Slim Scope);  Surgeon: Gatha Mayer, MD;  Location: [facility];  Service: Endoscopy;  Laterality: N/A;  . UMBILICAL HERNIA REPAIR      Social History   Socioeconomic History  . Marital status: Married    Spouse name: peggie  . Number of children: 2  . Years of education: hs gr  . Highest education level: Not on file  Occupational History  . Occupation: retired  Scientific laboratory technician  . Financial resource strain: Not on file  . Food insecurity:    Worry: Not on file    Inability: Not on file  . Transportation needs:    Medical: Not on file    Non-medical: Not on file  Tobacco Use  . Smoking status: Former Smoker    Last  attempt to quit: 11/01/2001    Years since quitting: 16.4  . Smokeless tobacco: Never Used  Substance and Sexual Activity  . Alcohol use: No    Alcohol/week: 0.0 oz  . Drug use: No  . Sexual activity: Not Currently    Partners: Female    Birth control/protection: None  Lifestyle  . Physical activity:    Days per week: Not on file    Minutes per session: Not on file  . Stress: Not on file  Relationships  . Social connections:    Talks on phone: Not on file    Gets together: Not on file    Attends religious service: Not on file    Active member of club or organization: Not on file    Attends meetings of clubs or organizations: Not on file    Relationship status: Not on file  . Intimate partner violence:    Fear of current or ex partner: Not on file    Emotionally abused: Not on file    Physically abused: Not on file    Forced sexual activity: Not on file  Other Topics Concern  . Not on file  Social History Narrative   Patient is married with 2 children.   Patient is right handed.   Patient  has hs education.   Patient drinks tea on sundays, sodas occ.    Family History  Problem Relation Age of Onset  . CVA Mother   . Alzheimer's disease Mother   . Heart attack Father   . Heart disease Father   . Diabetes Brother   . Rectal cancer Brother   . Stomach cancer Neg Hx   . Colon cancer Neg Hx     Current Outpatient Medications  Medication Sig Dispense Refill  . aspirin EC 81 MG tablet Take 81 mg by mouth daily.    . clopidogrel (PLAVIX) 75 MG tablet Take 1 tablet by mouth daily.    Marland Kitchen ezetimibe (ZETIA) 10 MG tablet Take 10 mg by mouth daily.    . insulin aspart (NOVOLOG) 100 UNIT/ML injection Inject 10 Units into the skin 3 (three) times daily before meals.    Marland Kitchen LANTUS SOLOSTAR 100 UNIT/ML Solostar Pen Inject 35 Units into the skin 2 (two) times daily.     Marland Kitchen losartan-hydrochlorothiazide (HYZAAR) 100-25 MG per tablet Take 1 tablet by mouth daily.    Marland Kitchen acetaminophen  (TYLENOL ARTHRITIS PAIN) 650 MG CR tablet Take 1,300 mg by mouth every 8 (eight) hours as needed for pain.     Marland Kitchen amLODipine (NORVASC) 10 MG tablet Take 10 mg by mouth daily.    . Oxymetazoline HCl (VICKS SINEX 12 HOUR NA) Place 1 spray into both nostrils daily as needed (congestion).     No current facility-administered medications for this visit.     No Known Allergies  REVIEW OF SYSTEMS (negative unless checked):   Cardiac:  []  Chest pain or chest pressure? []  Shortness of breath upon activity? []  Shortness of breath when lying flat? []  Irregular heart rhythm?  Vascular:  [x]  Pain in calf, thigh, or hip brought on by walking? [x]  Pain in feet at night that wakes you up from your sleep? []  Blood clot in your veins? []  Leg swelling?  Pulmonary:  []  Oxygen at home? []  Productive cough? []  Wheezing?  Neurologic:  []  Sudden weakness in arms or legs? []  Sudden numbness in arms or legs? []  Sudden onset of difficult speaking or slurred speech? []  Temporary loss of vision in one eye? []  Problems with dizziness?  Gastrointestinal:  []  Blood in stool? []  Vomited blood?  Genitourinary:  []  Burning when urinating? []  Blood in urine?  Psychiatric:  []  Major depression  Hematologic:  []  Bleeding problems? []  Problems with blood clotting?  Dermatologic:  [x]  Rashes or ulcers?  Constitutional:  []  Fever or chills?  Ear/Nose/Throat:  []  Change in hearing? []  Nose bleeds? []  Sore throat?  Musculoskeletal:  []  Back pain? []  Joint pain? []  Muscle pain?   For VQI Use Only   PRE-ADM LIVING Home  AMB STATUS Ambulatory  CAD Sx None  PRIOR CHF None  STRESS TEST No    Physical Examination     Vitals:   04/10/18 1333  BP: (!) 160/74  Pulse: (!) 53  Resp: 20  SpO2: 96%  Weight: 281 lb (127.5 kg)  Height: 5' 8.5" (1.74 m)   Body mass index is 42.1 kg/m.  General Alert, O x 3, Obese, NAD  Head [place]/AT,    Ear/Nose/ Throat Hearing grossly intact, nares  without erythema or drainage, oropharynx without Erythema or Exudate, Mallampati score: 3,   Eyes PERRLA, EOMI,    Neck Supple, mid-line trachea,    Pulmonary Sym exp, good B air movt, CTA B  Cardiac RRR, Nl S1, S2,  no Murmurs, No rubs, No S3,S4  Vascular Vessel Right Left  Radial Palpable Palpable  Brachial Palpable Palpable  Carotid Palpable, No Bruit Palpable, No Bruit  Aorta Not palpable N/A  Femoral Not palpable due to pannus Not palpable due to pannus  Popliteal Not palpable Not palpable  PT Not palpable Not palpable  DP Not palpable Not palpable    Gastro- intestinal soft, non-distended, non-tender to palpation, No guarding or rebound, no HSM, no masses, no CVAT B, No palpable prominent aortic pulse,    Musculo- skeletal M/S 5/5 throughout  , Extremities without ischemic changes except R ant shin: ischemic ulceration, clean, 3 mm in diameter, dorsum of R foot: clean, ulcer 2-3 mm in diameter, tip of R 5th toe: 1-2 mm ulcer, clean without drainage  , No edema present, No visible varicosities , No Lipodermatosclerosis present  Neurologic Cranial nerves 2-12 intact , Pain and light touch intact in extremities , Motor exam as listed above  Psychiatric Judgement intact, Mood & affect appropriate for pt's clinical situation  Dermatologic See M/S exam for extremity exam, No rashes otherwise noted  Lymphatic  Palpable lymph nodes: None    Non-Invasive Vascular Imaging   ABI (04/04/18)  R:   ABI: No waveforms present,   PT: none  DP: none  TBI:  None  L:   ABI: 0.70,   PT: mono  DP: mono  TBI: 0.50  BLE Arterial Duplex (04/10/2018)  R:  Monophasic throughout except distal SFA occlusion, AT damp mono, ATA distal occluded, DP occluded  L:  Monophasic throughout   Medical Decision Making   Mitch Arquette is a 72 y.o. male who presents with: RLE critical limb ischemia, LLE mod PAD   Pt has intact sensation and motor in R leg so he does NOT have threatened  limb.  RLE arterial duplex also demonstrates collateral flow in R PT and peroneal arteries.  The monophasic flow in both CFA suggests inflow disease also.  I discussed with the patient the natural history of critical limb ischemia: 25% require amputation in one year, 50% are able to maintain their limbs in one year, and 25-30% die in one year due to comorbidities.  Given the limb threatening status of this patient, I recommend an aggressive work up including proceeding with an: Aortogram, Bilateral runoff and possible intervention RLE.   . I discussed with the patient the possibility of left brachial artery access for diagnostic purposes if iliac arterial system is occluded. . I discussed with the patient the nature of angiographic procedures, especially the limited patencies of any endovascular intervention. . The patient is aware of that the risks of an angiographic procedure include but are not limited to: bleeding, infection, access site complications, embolization, rupture of treated vessel, dissection, possible need for emergent surgical intervention, and possible need for surgical procedures to treat the patient's pathology. . The patient is aware of the risks and agrees to proceed.  The procedure is scheduled for: 13 JUN 19.  I discussed in depth with the patient the nature of atherosclerosis, and emphasized the importance of maximal medical management including strict control of blood pressure, blood glucose, and lipid levels, antiplatelet agents, obtaining regular exercise, and cessation of smoking.  The patient is aware that without maximal medical management the underlying atherosclerotic disease process will progress, limiting the benefit of any interventions.  The patient is currently not on on statin as not medically indicated.   The patient is currently on an anti-platelet: ASA and Plavix.  Thank you for allowing [place] to participate in this patient's care.   Adele Barthel, MD,  FACS [facility] Office: [phone] Pager: [phone]  04/10/2018, 3:37 PM

## 2018-04-11 ENCOUNTER — Encounter: Payer: Self-pay | Admitting: Vascular Surgery

## 2018-04-13 ENCOUNTER — Encounter (HOSPITAL_COMMUNITY)
Admission: RE | Disposition: A | Discharge status: Home / Self Care | Payer: Self-pay | Source: Ambulatory Visit | Attending: Vascular Surgery

## 2018-04-13 ENCOUNTER — Ambulatory Visit (HOSPITAL_COMMUNITY)
Admission: RE | Admit: 2018-04-13 | Discharge: 2018-04-13 | Disposition: A | Discharge status: Home / Self Care | Payer: Medicare Other | Source: Ambulatory Visit | Attending: Vascular Surgery | Admitting: Vascular Surgery

## 2018-04-13 ENCOUNTER — Other Ambulatory Visit: Payer: Self-pay

## 2018-04-13 DIAGNOSIS — I998 Other disorder of circulatory system: Secondary | ICD-10-CM | POA: Diagnosis present

## 2018-04-13 DIAGNOSIS — I70235 Atherosclerosis of native arteries of right leg with ulceration of other part of foot: Secondary | ICD-10-CM | POA: Diagnosis not present

## 2018-04-13 DIAGNOSIS — Z794 Long term (current) use of insulin: Secondary | ICD-10-CM | POA: Insufficient documentation

## 2018-04-13 DIAGNOSIS — N529 Male erectile dysfunction, unspecified: Secondary | ICD-10-CM | POA: Insufficient documentation

## 2018-04-13 DIAGNOSIS — M1990 Unspecified osteoarthritis, unspecified site: Secondary | ICD-10-CM | POA: Insufficient documentation

## 2018-04-13 DIAGNOSIS — Z7901 Long term (current) use of anticoagulants: Secondary | ICD-10-CM | POA: Diagnosis not present

## 2018-04-13 DIAGNOSIS — E1151 Type 2 diabetes mellitus with diabetic peripheral angiopathy without gangrene: Secondary | ICD-10-CM | POA: Insufficient documentation

## 2018-04-13 DIAGNOSIS — Z87891 Personal history of nicotine dependence: Secondary | ICD-10-CM | POA: Insufficient documentation

## 2018-04-13 DIAGNOSIS — Z8719 Personal history of other diseases of the digestive system: Secondary | ICD-10-CM | POA: Insufficient documentation

## 2018-04-13 DIAGNOSIS — E7800 Pure hypercholesterolemia, unspecified: Secondary | ICD-10-CM | POA: Insufficient documentation

## 2018-04-13 DIAGNOSIS — Z8249 Family history of ischemic heart disease and other diseases of the circulatory system: Secondary | ICD-10-CM | POA: Insufficient documentation

## 2018-04-13 DIAGNOSIS — Z9889 Other specified postprocedural states: Secondary | ICD-10-CM | POA: Diagnosis not present

## 2018-04-13 DIAGNOSIS — M109 Gout, unspecified: Secondary | ICD-10-CM | POA: Diagnosis not present

## 2018-04-13 DIAGNOSIS — Z833 Family history of diabetes mellitus: Secondary | ICD-10-CM | POA: Diagnosis not present

## 2018-04-13 DIAGNOSIS — G4733 Obstructive sleep apnea (adult) (pediatric): Secondary | ICD-10-CM | POA: Insufficient documentation

## 2018-04-13 DIAGNOSIS — Z823 Family history of stroke: Secondary | ICD-10-CM | POA: Diagnosis not present

## 2018-04-13 DIAGNOSIS — Z7982 Long term (current) use of aspirin: Secondary | ICD-10-CM | POA: Insufficient documentation

## 2018-04-13 DIAGNOSIS — I10 Essential (primary) hypertension: Secondary | ICD-10-CM | POA: Insufficient documentation

## 2018-04-13 DIAGNOSIS — Z8673 Personal history of transient ischemic attack (TIA), and cerebral infarction without residual deficits: Secondary | ICD-10-CM | POA: Diagnosis not present

## 2018-04-13 DIAGNOSIS — L97209 Non-pressure chronic ulcer of unspecified calf with unspecified severity: Secondary | ICD-10-CM | POA: Insufficient documentation

## 2018-04-13 DIAGNOSIS — I70232 Atherosclerosis of native arteries of right leg with ulceration of calf: Secondary | ICD-10-CM | POA: Diagnosis not present

## 2018-04-13 DIAGNOSIS — L97519 Non-pressure chronic ulcer of other part of right foot with unspecified severity: Secondary | ICD-10-CM | POA: Insufficient documentation

## 2018-04-13 DIAGNOSIS — E11621 Type 2 diabetes mellitus with foot ulcer: Secondary | ICD-10-CM | POA: Insufficient documentation

## 2018-04-13 DIAGNOSIS — I70229 Atherosclerosis of native arteries of extremities with rest pain, unspecified extremity: Secondary | ICD-10-CM | POA: Diagnosis present

## 2018-04-13 DIAGNOSIS — Z79899 Other long term (current) drug therapy: Secondary | ICD-10-CM | POA: Insufficient documentation

## 2018-04-13 DIAGNOSIS — I70211 Atherosclerosis of native arteries of extremities with intermittent claudication, right leg: Secondary | ICD-10-CM | POA: Insufficient documentation

## 2018-04-13 DIAGNOSIS — Z6841 Body Mass Index (BMI) 40.0 and over, adult: Secondary | ICD-10-CM | POA: Diagnosis not present

## 2018-04-13 HISTORY — PX: ABDOMINAL AORTOGRAM: CATH118222

## 2018-04-13 HISTORY — PX: LOWER EXTREMITY ANGIOGRAPHY: CATH118251

## 2018-04-13 LAB — POCT I-STAT, CHEM 8
BUN: 23 mg/dL — AB (ref 6–20)
CHLORIDE: 103 mmol/L (ref 101–111)
Calcium, Ion: 1.22 mmol/L (ref 1.15–1.40)
Creatinine, Ser: 0.9 mg/dL (ref 0.61–1.24)
GLUCOSE: 138 mg/dL — AB (ref 65–99)
HCT: 41 % (ref 39.0–52.0)
Hemoglobin: 13.9 g/dL (ref 13.0–17.0)
Potassium: 4 mmol/L (ref 3.5–5.1)
Sodium: 142 mmol/L (ref 135–145)
TCO2: 26 mmol/L (ref 22–32)

## 2018-04-13 LAB — GLUCOSE, CAPILLARY: Glucose-Capillary: 124 mg/dL — ABNORMAL HIGH (ref 65–99)

## 2018-04-13 SURGERY — LOWER EXTREMITY ANGIOGRAPHY
Anesthesia: LOCAL

## 2018-04-13 MED ORDER — IODIXANOL 320 MG/ML IV SOLN
INTRAVENOUS | Status: DC | PRN
  Administered 2018-04-13: 150 mL via INTRA_ARTERIAL

## 2018-04-13 MED ORDER — SODIUM CHLORIDE 0.9 % IV SOLN: 250.0000 mL | INTRAVENOUS | Status: DC | PRN

## 2018-04-13 MED ORDER — HEPARIN SODIUM (PORCINE) 1000 UNIT/ML IJ SOLN
INTRAMUSCULAR | Status: AC
  Filled 2018-04-13: qty 1

## 2018-04-13 MED ORDER — NITROGLYCERIN 1 MG/10 ML FOR IR/CATH LAB
INTRA_ARTERIAL | Status: AC
  Filled 2018-04-13: qty 10

## 2018-04-13 MED ORDER — HEPARIN (PORCINE) IN NACL 1000-0.9 UT/500ML-% IV SOLN
INTRAVENOUS | Status: AC
  Filled 2018-04-13: qty 1000

## 2018-04-13 MED ORDER — LIDOCAINE HCL (PF) 1 % IJ SOLN
INTRAMUSCULAR | Status: AC
  Filled 2018-04-13: qty 30

## 2018-04-13 MED ORDER — SODIUM CHLORIDE 0.9 % IV SOLN
INTRAVENOUS | Status: DC
  Administered 2018-04-13: 09:00:00 via INTRAVENOUS

## 2018-04-13 MED ORDER — MIDAZOLAM HCL 2 MG/2ML IJ SOLN
INTRAMUSCULAR | Status: AC
  Filled 2018-04-13: qty 2

## 2018-04-13 MED ORDER — ACETAMINOPHEN 325 MG PO TABS: 650.0000 mg | ORAL_TABLET | ORAL | Status: DC | PRN

## 2018-04-13 MED ORDER — SODIUM CHLORIDE 0.9% FLUSH: 3.0000 mL | Freq: Two times a day (BID) | INTRAVENOUS | Status: DC

## 2018-04-13 MED ORDER — HEPARIN SODIUM (PORCINE) 1000 UNIT/ML IJ SOLN
INTRAMUSCULAR | Status: DC | PRN
  Administered 2018-04-13: 2000 [IU] via INTRAVENOUS

## 2018-04-13 MED ORDER — HEPARIN (PORCINE) IN NACL 2-0.9 UNITS/ML
INTRAMUSCULAR | Status: AC | PRN
  Administered 2018-04-13: 500 mL

## 2018-04-13 MED ORDER — LIDOCAINE HCL (PF) 1 % IJ SOLN
INTRAMUSCULAR | Status: DC | PRN
  Administered 2018-04-13: 5 mL
  Administered 2018-04-13: 18 mL

## 2018-04-13 MED ORDER — HYDRALAZINE HCL 20 MG/ML IJ SOLN: 5.0000 mg | INTRAMUSCULAR | Status: DC | PRN

## 2018-04-13 MED ORDER — ONDANSETRON HCL 4 MG/2ML IJ SOLN: 4.0000 mg | Freq: Four times a day (QID) | INTRAMUSCULAR | Status: DC | PRN

## 2018-04-13 MED ORDER — MIDAZOLAM HCL 2 MG/2ML IJ SOLN
INTRAMUSCULAR | Status: DC | PRN
  Administered 2018-04-13: 1 mg via INTRAVENOUS

## 2018-04-13 MED ORDER — SODIUM CHLORIDE 0.9% FLUSH: 3.0000 mL | INTRAVENOUS | Status: DC | PRN

## 2018-04-13 MED ORDER — SODIUM CHLORIDE 0.9 % WEIGHT BASED INFUSION: 1.0000 mL/kg/h | INTRAVENOUS | Status: DC

## 2018-04-13 MED ORDER — FENTANYL CITRATE (PF) 100 MCG/2ML IJ SOLN
INTRAMUSCULAR | Status: DC | PRN
  Administered 2018-04-13: 50 ug via INTRAVENOUS

## 2018-04-13 MED ORDER — NITROGLYCERIN 1 MG/10 ML FOR IR/CATH LAB
INTRA_ARTERIAL | Status: DC | PRN
  Administered 2018-04-13: 200 ug via INTRA_ARTERIAL

## 2018-04-13 MED ORDER — FENTANYL CITRATE (PF) 100 MCG/2ML IJ SOLN
INTRAMUSCULAR | Status: AC
  Filled 2018-04-13: qty 2

## 2018-04-13 MED ORDER — LABETALOL HCL 5 MG/ML IV SOLN: 10.0000 mg | INTRAVENOUS | Status: DC | PRN

## 2018-04-13 SURGICAL SUPPLY — 15 items
CATH ANGIO 5F PIGTAIL 100CM (CATHETERS) ×2 IMPLANT
CATH OMNI FLUSH 5F 65CM (CATHETERS) ×2 IMPLANT
COVER PRB 48X5XTLSCP FOLD TPE (BAG) ×1 IMPLANT
COVER PROBE 5X48 (BAG) ×2
HOVERMATT SINGLE USE (MISCELLANEOUS) ×2 IMPLANT
KIT MICROPUNCTURE NIT STIFF (SHEATH) ×2 IMPLANT
KIT PV (KITS) ×2 IMPLANT
SHEATH PINNACLE 5F 10CM (SHEATH) ×2 IMPLANT
STOPCOCK MORSE 400PSI 3WAY (MISCELLANEOUS) ×2 IMPLANT
SYR MEDRAD MARK V 150ML (SYRINGE) ×2 IMPLANT
TRANSDUCER W/STOPCOCK (MISCELLANEOUS) ×2 IMPLANT
TRAY PV CATH (CUSTOM PROCEDURE TRAY) ×2 IMPLANT
TUBING HIGH PRESSURE 120CM (CONNECTOR) ×2 IMPLANT
WIRE BENTSON .035X145CM (WIRE) ×2 IMPLANT
WIRE TORQFLEX AUST .018X40CM (WIRE) ×4 IMPLANT

## 2018-04-13 NOTE — Op Note (Signed)
OPERATIVE NOTE   PROCEDURE: 1.  Left common femoral artery cannulation under ultrasound guidance 2.  Left brachial artery cannulation under ultrasound guidance 3.  Placement of catheter in aorta 4.  Aortogram 5.  Bilateral leg runoff  6.  Conscious sedation for 30 minutes  PRE-OPERATIVE DIAGNOSIS: right foot wounds  POST-OPERATIVE DIAGNOSIS: same as above   SURGEON: Adele Barthel, MD  ANESTHESIA: conscious sedation  ESTIMATED BLOOD LOSS: 50 cc  CONTRAST: 115 cc  FINDING(S):  Aorta: patent, abnormal flow, suggestion of dissection flap  Celiac artery: patent  Superior mesenteric artery: patent   Right Left  RA patent patent  CIA patent patent  EIA occluded occluded  IIA Patent, extensive collaterals Patent, extensive collaterals  CFA patent patent  SFA occluded occluded  PFA patent patent  Pop patent patent  Trif patent patent  AT occluded occluded  Pero patent patent  PT Aberrant takeoff of PT above the knee, runoff to ankle patent   SPECIMEN(S):  none  INDICATIONS:   Jacob Avery is a 72 y.o. male who presents with right foot wounds.  The patient presents for: aortogram, bilateral leg runoff, and possible intervention on right leg.  I discussed with the patient the nature of angiographic procedures, especially the limited patencies of any endovascular intervention.  The patient is aware of that the risks of an angiographic procedure include but are not limited to: bleeding, infection, access site complications, renal failure, embolization, rupture of vessel, dissection, possible need for emergent surgical intervention, possible need for surgical procedures to treat the patient's pathology, and stroke and death.  The patient is aware of the risks and agrees to proceed.  DESCRIPTION: After full informed consent was obtained from the patient, the patient was brought back to the angiography suite.  The patient was placed supine upon the angiography table and connected  to cardiopulmonary monitoring equipment.  The patient was then given conscious sedation, the amounts of which are documented in the patient's chart.  A circulating radiologic technician maintained continuous monitoring of the patient's cardiopulmonary status.  Additionally, the control room radiologic technician provided backup monitoring throughout the procedure.  The patient was prepped and drape in the standard fashion for an angiographic procedure.     I looked at both common femoral arteries with the Sonosite.  There was no pulse in the right common femoral artery.  There was minimal flow in the left common femoral artery so I felt an attempt at cannulation was indicated.  At this point, attention was turned to the left groin.  Under ultrasound guidance, the subcutaneous tissue surrounding the left common femoral artery was anesthesized with 1% lidocaine with epinephrine.  Under Sonosite guidance, the patency of the artery was noted to be: patent but near no arterial pulsation.  Ultrasound image was permanently recorded.  The artery was then cannulated with a micropuncture needle.  The microwire was advanced into the left external iliac artery with great difficulty.  The needle was exchanged for a microsheath.  I could not get neither sheath with dilator or dilator to penetrate the wall of the common femoral artery.  I removed the wire and sheath and held pressure for 5 minutes.    At this point, I felt femoral access was not possible.  I turned my attention to the left arm.  The left arm was repositioned and prepped.  I placed a brachial drape of the antecubitum.  I identified the left brachial artery with Sonosite.  An image of the artery  was obtained and printed.  I injected 3 cc of 1% lidocaine without epinephrine in the subcutaneous tract and skin overlying the brachial artery.  I cannulated the brachial artery with a micropuncture needle under Sonosite guidance.  I loaded a microsheath over the wire  and advanced it into the left brachial artery.  I removed the dilator and needle.  A Bentson wire was loaded through the sheath and advanced into the left subclavian artery.  I exchanged the sheath for the 5-Fr sheath.  I loaded the long pigtail catheter over the wire and advanced the wire and catheter into the aortic arch.  Using this combination, I selected the descending thoracic aorta.  I advanced the catheter down into the abdominal aorta, roughly down to L1.  The wire was removed and the catheter connected to the power injector circuit.    An aortogram was completed.  The findings are listed above.  The flow in the aorta was slowed than expected.  I advanced the catheter into the infrarenal aorta and did another injections.  The findings are listed above.  I tried to advance the catheter, but the catheter kept getting push to the side, consistent with the visualized possible chronic dissection flap.  A pelvic injection was completed which confirmed chronic bilateral external iliac artery occlusions with internal iliac artery collateralizations.  I positioned the catheter for a bilateral leg runoff.  An automated bilateral leg runoff was completed.  The findings are listed above.  Based on the images, the patient needs: cardiac preoperative risk stratification and a CTA chest/abd/pelvis to evaluate the aorta for a chronic dissection.  Once that information is available, will be able to decide best way to revascularize this patient.   COMPLICATIONS: none  CONDITION: stable   Adele Barthel, MD, [facility] [place] Office: [phone] Pager: [phone]  04/13/2018, 12:10 PM

## 2018-04-13 NOTE — Interval H&P Note (Signed)
   History and Physical Update  This patient does not appear to have CKD despite my recollections, so no carbon dioxide will be used for the study.   Adele Barthel, MD, FACS [facility] [place] Office: [phone] Pager: [phone]  04/13/2018, 8:50 AM

## 2018-04-13 NOTE — Progress Notes (Signed)
80fr sheath aspirated and removed from left brachial artery. Manual pressure applied for 30 minutes.   Site level 0, left radial pulse palpable. Spo2 98% as measured in left thumb.   Tegaderm dressing applied, bedrest instructions given.   Bedrest begins at 12:15:00

## 2018-04-13 NOTE — Interval H&P Note (Signed)
   History and Physical Update  The patient was interviewed and re-examined.  The patient's previous History and Physical has been reviewed and is unchanged from my consult.  There is no change in the plan of care: aortogram with carbon dioxide, left leg runoff with carbon dioxide and IV contrast.   I discussed with the patient the nature of angiographic procedures, especially the limited patencies of any endovascular intervention.    The patient is aware of that the risks of an angiographic procedure include but are not limited to: bleeding, infection, access site complications, renal failure, embolization, rupture of vessel, dissection, arteriovenous fistula, possible need for emergent surgical intervention, possible need for surgical procedures to treat the patient's pathology, anaphylactic reaction to contrast, and stroke and death.    The patient is aware of the risks and agrees to proceed.   Adele Barthel, MD, FACS [facility] [place] Office: [phone] Pager: [phone]  04/13/2018, 8:49 AM

## 2018-04-13 NOTE — Discharge Instructions (Signed)
° °  [facility]  Discharge Instructions  Lower Extremity Angiogram; Angioplasty/Stenting  Please refer to the following instructions for your post-procedure care. Your surgeon or physician assistant will discuss any changes with you.  Activity  Avoid lifting more than 8 pounds (1 gallons of milk) for 72 hours (3 days) after your procedure. You may walk as much as you can tolerate. It's OK to drive after 72 hours.  Bathing/Showering  You may shower the day after your procedure. If you have a bandage, you may remove it at 24- 48 hours. Clean your incision site with mild soap and water. Pat the area dry with a clean towel.  Diet  DRINK PLENTY OF FLUIDS FOR THE NEXT 2-3 DAYS TO KEEP HYDRATED.  Resume your pre-procedure diet. There are no special food restrictions following this procedure. All patients with peripheral vascular disease should follow a low fat/low cholesterol diet. In order to heal from your surgery, it is CRITICAL to get adequate nutrition. Your body requires vitamins, minerals, and protein. Vegetables are the best source of vitamins and minerals. Vegetables also provide the perfect balance of protein. Processed food has little nutritional value, so try to avoid this.  Medications  Resume taking all of your medications unless your doctor tells you not to. If your incision is causing pain, you may take over-the-counter pain relievers such as acetaminophen (Tylenol)  Follow Up  Follow up will be arranged at the time of your procedure. You may have an office visit scheduled or may be scheduled for surgery. Ask your surgeon if you have any questions.  Please call us immediately for any of the following conditions: Severe or worsening pain your legs or feet at rest or with walking. Increased pain, redness, drainage at your groin puncture site. Fever of 101 degrees or higher. If you have any mild or slow bleeding from your puncture site: lie  down, apply firm constant pressure over the area with a piece of gauze or a clean wash cloth for 30 minutes- no peeking!, call 911 right away if you are still bleeding after 30 minutes, or if the bleeding is heavy and unmanageable.  Reduce your risk factors of vascular disease:  Stop smoking. If you would like help call [facility] at 1-800-QUIT-NOW (604)024-7853) or [place] at [phone]. Manage your cholesterol Maintain a desired weight Control your diabetes Keep your blood pressure down  If you have any questions, please call the office at [phone]

## 2018-04-14 ENCOUNTER — Encounter (HOSPITAL_COMMUNITY): Payer: Self-pay | Admitting: Vascular Surgery

## 2018-04-18 ENCOUNTER — Other Ambulatory Visit: Payer: Self-pay

## 2018-04-18 DIAGNOSIS — I7100 Dissection of unspecified site of aorta: Secondary | ICD-10-CM

## 2018-04-18 DIAGNOSIS — I998 Other disorder of circulatory system: Secondary | ICD-10-CM

## 2018-04-18 DIAGNOSIS — I70229 Atherosclerosis of native arteries of extremities with rest pain, unspecified extremity: Secondary | ICD-10-CM

## 2018-04-19 ENCOUNTER — Encounter: Payer: Self-pay | Admitting: Cardiovascular Disease

## 2018-04-19 ENCOUNTER — Ambulatory Visit (INDEPENDENT_AMBULATORY_CARE_PROVIDER_SITE_OTHER): Payer: Medicare Other | Admitting: Cardiovascular Disease

## 2018-04-19 ENCOUNTER — Encounter: Payer: Self-pay | Admitting: *Deleted

## 2018-04-19 VITALS — BP 130/80 | HR 116 | Ht 67.0 in | Wt 280.4 lb

## 2018-04-19 DIAGNOSIS — R0609 Other forms of dyspnea: Secondary | ICD-10-CM | POA: Diagnosis not present

## 2018-04-19 DIAGNOSIS — I10 Essential (primary) hypertension: Secondary | ICD-10-CM | POA: Diagnosis not present

## 2018-04-19 DIAGNOSIS — R9431 Abnormal electrocardiogram [ECG] [EKG]: Secondary | ICD-10-CM | POA: Diagnosis not present

## 2018-04-19 DIAGNOSIS — I739 Peripheral vascular disease, unspecified: Secondary | ICD-10-CM

## 2018-04-19 DIAGNOSIS — E785 Hyperlipidemia, unspecified: Secondary | ICD-10-CM

## 2018-04-19 NOTE — Progress Notes (Signed)
CARDIOLOGY CONSULT NOTE  Patient ID: Jacob Avery MRN: [id number] DOB/AGE: [birth year] 72 y.o.  Admit date: (Not on file) Primary Physician: [name], MD Referring Physician: Dr. Adele Barthel  Reason for Consultation: Preoperative risk stratification  HPI: Jacob Avery is a 72 y.o. male who is being seen today for the evaluation of preoperative risk stratification at the request of Conrad [place], MD.   Past medical history includes hypertension, history of CVA, and hyperlipidemia.  He underwent lower extremity and abdominal angiography on 04/13/2018 which I reviewed.  He appeared to have a chronic aortic dissection with chronic bilateral external iliac artery occlusion with extensive bilateral internal iliac artery collaterals.  He had bilateral occlusion of the superficial femoral arteries with reconstitution of the right above-the-knee popliteal artery and the same on the left.  The most optimal form of revascularization is being considered by vascular surgery.  I reviewed his most recent echocardiogram performed on 10/26/2014 which demonstrated normal left ventricular systolic function and regional wall motion, LVEF 55 to 60%, with grade 1 diastolic dysfunction and mild to moderate left atrial dilatation with mildly elevated pulmonary pressures, 35 mmHg.  I personally reviewed the ECG performed on 04/13/2018 which demonstrated sinus bradycardia, 51 bpm, with septal Q waves and possible LVH.  He denies exertional chest pain.  He also denies palpitations, orthopnea, syncope, and paroxysmal nocturnal dyspnea.  His primary complaint relates to bilateral leg claudication with short walking distances.  He also complains of exertional dyspnea and exertional fatigue but wonders if it is more related to his legs.  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  He was given 1 pack of cigarettes daily for free.  He is uncertain how many cigarettes he used to  smoke in a day but quit about 15 to 20 years ago.   No Known Allergies  Current Outpatient Medications  Medication Sig Dispense Refill  . acetaminophen (TYLENOL ARTHRITIS PAIN) 650 MG CR tablet Take 1,300 mg by mouth every 8 (eight) hours as needed for pain.     Marland Kitchen amLODipine (NORVASC) 10 MG tablet Take 10 mg by mouth every evening.     Marland Kitchen aspirin EC 81 MG tablet Take 81 mg by mouth daily.    Marland Kitchen atorvastatin (LIPITOR) 80 MG tablet Take 80 mg by mouth daily.    . clopidogrel (PLAVIX) 75 MG tablet Take 75 mg by mouth daily.     . colchicine (COLCRYS) 0.6 MG tablet Take 0.6 mg by mouth daily as needed (for gout flare ups.).     Marland Kitchen ezetimibe (ZETIA) 10 MG tablet Take 10 mg by mouth daily.    . ferrous sulfate 325 (65 FE) MG tablet Take 325 mg by mouth daily with breakfast.    . insulin lispro (HUMALOG) 100 UNIT/ML KiwkPen Inject 10 Units into the skin 2 (two) times daily before a meal.     . LANTUS SOLOSTAR 100 UNIT/ML Solostar Pen Inject 30-45 Units into the skin See admin instructions. Inject 45 units subcutaneously in the morning, and 30 units subcutaneously in the evening    . losartan-hydrochlorothiazide (HYZAAR) 100-25 MG per tablet Take 1 tablet by mouth daily.    . Oxymetazoline HCl (VICKS SINEX 12 HOUR NA) Place 1 spray into both nostrils daily as needed (congestion).     No current facility-administered medications for this visit.     Past Medical History:  Diagnosis Date  . Arthritis   . Chronic  kidney disease    Stage 2  . CVA (cerebral vascular accident) ([place]) 10/2014    Late effect of cerebrovascular disease.  Mostly resolved, but can see difference in facial expressions.  tp-A, Plavix afterward  . Diabetes mellitus without complication ([place])    type II, controlled, with renal comps  . Diabetic foot ([place]) 04/05/2018   Normal sensation, skin intact  . Diverticulosis   . ED (erectile dysfunction)   . GI bleed 04/2017  . Gout   . Hx of adenomatous polyp of rectum 06/10/2017    . Hypercholesterolemia   . Hypertension   . Long term (current) use of insulin ([place])    Remains on basal/bolus therapy without hypoglycemia.  . Microalbuminuria 2013  . Morbid obesity ([place])   . Obesity   . OSA (obstructive sleep apnea)    Intolerant to CPAP  . Peripheral vascular disease ([place])   . Right calf pain 04/05/2018  . Vertigo     Past Surgical History:  Procedure Laterality Date  . ABDOMINAL AORTOGRAM N/A 04/13/2018   Procedure: ABDOMINAL AORTOGRAM;  Surgeon: Conrad Ardoch, MD;  Location: [facility];  Service: Cardiovascular;  Laterality: N/A;  . COLONOSCOPY WITH PROPOFOL N/A 06/08/2017   Procedure: COLONOSCOPY WITH PROPOFOL ( Ultra-Slim Scope);  Surgeon: Gatha Mayer, MD;  Location: [facility];  Service: Endoscopy;  Laterality: N/A;  . LOWER EXTREMITY ANGIOGRAPHY N/A 04/13/2018   Procedure: LOWER EXTREMITY ANGIOGRAPHY;  Surgeon: Conrad [place], MD;  Location: [facility];  Service: Cardiovascular;  Laterality: N/A;  bilateral   . UMBILICAL HERNIA REPAIR      Social History   Socioeconomic History  . Marital status: Married    Spouse name: [name]  . Number of children: 2  . Years of education: hs gr  . Highest education level: Not on file  Occupational History  . Occupation: retired    Comment: Education administrator  . Financial resource strain: Not on file  . Food insecurity:    Worry: Not on file    Inability: Not on file  . Transportation needs:    Medical: Not on file    Non-medical: Not on file  Tobacco Use  . Smoking status: Current Every Day Smoker    Packs/day: 1.00  . Smokeless tobacco: Never Used  Substance and Sexual Activity  . Alcohol use: No    Alcohol/week: 0.0 oz  . Drug use: No  . Sexual activity: Not Currently    Partners: Female    Birth control/protection: None  Lifestyle  . Physical activity:    Days per week: Not on file    Minutes per session: Not on file  . Stress: Not on file  Relationships  . Social  connections:    Talks on phone: Not on file    Gets together: Not on file    Attends religious service: Not on file    Active member of club or organization: Not on file    Attends meetings of clubs or organizations: Not on file    Relationship status: Not on file  . Intimate partner violence:    Fear of current or ex partner: Not on file    Emotionally abused: Not on file    Physically abused: Not on file    Forced sexual activity: Not on file  Other Topics Concern  . Not on file  Social History Narrative   Patient is married with 2 children.   Patient is right handed.  Patient has hs education.   Patient drinks tea on sundays, sodas occ.     No family history of premature CAD in 1st degree relatives.  Current Meds  Medication Sig  . acetaminophen (TYLENOL ARTHRITIS PAIN) 650 MG CR tablet Take 1,300 mg by mouth every 8 (eight) hours as needed for pain.   Marland Kitchen amLODipine (NORVASC) 10 MG tablet Take 10 mg by mouth every evening.   Marland Kitchen aspirin EC 81 MG tablet Take 81 mg by mouth daily.  Marland Kitchen atorvastatin (LIPITOR) 80 MG tablet Take 80 mg by mouth daily.  . clopidogrel (PLAVIX) 75 MG tablet Take 75 mg by mouth daily.   . colchicine (COLCRYS) 0.6 MG tablet Take 0.6 mg by mouth daily as needed (for gout flare ups.).   Marland Kitchen ezetimibe (ZETIA) 10 MG tablet Take 10 mg by mouth daily.  . ferrous sulfate 325 (65 FE) MG tablet Take 325 mg by mouth daily with breakfast.  . insulin lispro (HUMALOG) 100 UNIT/ML KiwkPen Inject 10 Units into the skin 2 (two) times daily before a meal.   . LANTUS SOLOSTAR 100 UNIT/ML Solostar Pen Inject 30-45 Units into the skin See admin instructions. Inject 45 units subcutaneously in the morning, and 30 units subcutaneously in the evening  . losartan-hydrochlorothiazide (HYZAAR) 100-25 MG per tablet Take 1 tablet by mouth daily.  . Oxymetazoline HCl (VICKS SINEX 12 HOUR NA) Place 1 spray into both nostrils daily as needed (congestion).      Review of systems complete  and found to be negative unless listed above in HPI    Physical exam Blood pressure 130/80, pulse (!) 116, height 5\' 7"  (1.702 m), weight 280 lb 6.4 oz (127.2 kg), SpO2 94 %.  Recheck of heart rate: 70 bpm range.  General: NAD Neck: No JVD, no thyromegaly or thyroid nodule.  Lungs: Diminished throughout, no crackles or wheezes.   CV: Nondisplaced PMI. Regular rate and rhythm, normal S1/S2, no S3/S4, no murmur.  No peripheral edema.  Abdomen: Soft, nontender, no distention.  Skin: Intact without lesions or rashes.  Neurologic: Alert and oriented x 3.  Psych: Normal affect. Extremities: No clubbing or cyanosis.  HEENT: Normal.   ECG: Most recent ECG reviewed.   Labs: Lab Results  Component Value Date/Time   K 4.0 04/13/2018 09:19 AM   BUN 23 (H) 04/13/2018 09:19 AM   CREATININE 0.90 04/13/2018 09:19 AM   ALT 17 05/05/2017 05:29 PM   HGB 13.9 04/13/2018 09:19 AM     Lipids: Lab Results  Component Value Date/Time   LDLCALC 91 10/26/2014 02:40 AM   CHOL 138 10/26/2014 02:40 AM   TRIG 91 10/26/2014 02:40 AM   HDL 29 (L) 10/26/2014 02:40 AM        ASSESSMENT AND PLAN:  1.  Exertional dyspnea with abnormal ECG: Given his many years of tobacco abuse and concomitant peripheral vascular disease, he has at a higher risk of having ischemic heart disease. I will proceed with a nuclear myocardial perfusion imaging study to evaluate for ischemic heart disease (Lexiscan Myoview). He may have some concomitant COPD as well.  2.  Hypertension: Blood pressure appears to be controlled.  No changes to therapy.  3.  Hyperlipidemia: Currently on Lipitor 80 mg.  4.  History of CVA: Currently on aspirin, Plavix, and Lipitor 80 mg.  5.  Peripheral vascular disease with claudication: Lower extremity angiography reviewed above.  Currently on antiplatelet and statin therapy.  Most optimal revascularization strategy is being planned by vascular surgery.  Disposition: Follow up in 10  weeks  Signed: Kate Sable, M.D., F.A.C.C.  04/19/2018, 9:40 AM

## 2018-04-19 NOTE — Patient Instructions (Addendum)
Medication Instructions:   Your physician recommends that you continue on your current medications as directed. Please refer to the Current Medication list given to you today.  Labwork:  NONE  Testing/Procedures: Your physician has requested that you have a lexiscan myoview. For further information please visit [email or web address]. Please follow instruction sheet, as given.  Follow-Up:  Your physician recommends that you schedule a follow-up appointment in: 10 weeks.  Any Other Special Instructions Will Be Listed Below (If Applicable).  If you need a refill on your cardiac medications before your next appointment, please call your pharmacy.

## 2018-04-25 ENCOUNTER — Encounter (HOSPITAL_COMMUNITY)
Admission: RE | Admit: 2018-04-25 | Discharge: 2018-04-25 | Disposition: A | Discharge status: Home / Self Care | Payer: Medicare Other | Source: Ambulatory Visit | Attending: Cardiovascular Disease | Admitting: Cardiovascular Disease

## 2018-04-25 ENCOUNTER — Encounter (HOSPITAL_BASED_OUTPATIENT_CLINIC_OR_DEPARTMENT_OTHER)
Admission: RE | Admit: 2018-04-25 | Discharge: 2018-04-25 | Disposition: A | Discharge status: Home / Self Care | Payer: Medicare Other | Source: Ambulatory Visit | Attending: Cardiovascular Disease | Admitting: Cardiovascular Disease

## 2018-04-25 ENCOUNTER — Encounter (HOSPITAL_COMMUNITY): Payer: Self-pay

## 2018-04-25 DIAGNOSIS — R9431 Abnormal electrocardiogram [ECG] [EKG]: Secondary | ICD-10-CM | POA: Diagnosis not present

## 2018-04-25 DIAGNOSIS — R0609 Other forms of dyspnea: Secondary | ICD-10-CM | POA: Diagnosis not present

## 2018-04-25 LAB — NM MYOCAR MULTI W/SPECT W/WALL MOTION / EF
CHL CUP RESTING HR STRESS: 51 {beats}/min
CSEPPHR: 68 {beats}/min
LV dias vol: 106 mL (ref 62–150)
LVSYSVOL: 43 mL
RATE: 0.34
SDS: 3
SRS: 1
SSS: 4
TID: 1.1

## 2018-04-25 MED ORDER — TECHNETIUM TC 99M TETROFOSMIN IV KIT
10.0000 | PACK | Freq: Once | INTRAVENOUS | Status: AC | PRN
  Administered 2018-04-25: 11 via INTRAVENOUS

## 2018-04-25 MED ORDER — TECHNETIUM TC 99M TETROFOSMIN IV KIT
30.0000 | PACK | Freq: Once | INTRAVENOUS | Status: AC | PRN
  Administered 2018-04-25: 31 via INTRAVENOUS

## 2018-04-25 MED ORDER — SODIUM CHLORIDE 0.9% FLUSH
INTRAVENOUS | Status: AC
  Administered 2018-04-25: 10 mL via INTRAVENOUS
  Filled 2018-04-25: qty 10

## 2018-04-25 MED ORDER — REGADENOSON 0.4 MG/5ML IV SOLN
INTRAVENOUS | Status: AC
  Administered 2018-04-25: 0.4 mg via INTRAVENOUS
  Filled 2018-04-25: qty 5

## 2018-04-26 ENCOUNTER — Other Ambulatory Visit: Payer: Self-pay

## 2018-04-26 ENCOUNTER — Ambulatory Visit (HOSPITAL_COMMUNITY)
Admission: RE | Admit: 2018-04-26 | Discharge: 2018-04-26 | Disposition: A | Discharge status: Home / Self Care | Payer: Medicare Other | Source: Ambulatory Visit | Attending: Vascular Surgery | Admitting: Vascular Surgery

## 2018-04-26 DIAGNOSIS — I70229 Atherosclerosis of native arteries of extremities with rest pain, unspecified extremity: Secondary | ICD-10-CM

## 2018-04-26 DIAGNOSIS — I7100 Dissection of unspecified site of aorta: Secondary | ICD-10-CM

## 2018-04-26 DIAGNOSIS — I998 Other disorder of circulatory system: Secondary | ICD-10-CM

## 2018-04-26 DIAGNOSIS — I739 Peripheral vascular disease, unspecified: Secondary | ICD-10-CM

## 2018-04-28 ENCOUNTER — Ambulatory Visit (HOSPITAL_COMMUNITY): Admission: RE | Admit: 2018-04-28 | Payer: Medicare Other | Source: Ambulatory Visit

## 2018-04-28 ENCOUNTER — Ambulatory Visit (HOSPITAL_COMMUNITY): Payer: Medicare Other | Attending: Vascular Surgery

## 2018-05-02 DIAGNOSIS — Z794 Long term (current) use of insulin: Secondary | ICD-10-CM | POA: Diagnosis not present

## 2018-05-02 DIAGNOSIS — E1129 Type 2 diabetes mellitus with other diabetic kidney complication: Secondary | ICD-10-CM | POA: Diagnosis not present

## 2018-05-02 DIAGNOSIS — I739 Peripheral vascular disease, unspecified: Secondary | ICD-10-CM | POA: Diagnosis not present

## 2018-05-02 DIAGNOSIS — N182 Chronic kidney disease, stage 2 (mild): Secondary | ICD-10-CM | POA: Diagnosis not present

## 2018-05-02 DIAGNOSIS — Z6841 Body Mass Index (BMI) 40.0 and over, adult: Secondary | ICD-10-CM | POA: Diagnosis not present

## 2018-05-02 DIAGNOSIS — I10 Essential (primary) hypertension: Secondary | ICD-10-CM | POA: Diagnosis not present

## 2018-05-10 ENCOUNTER — Ambulatory Visit (HOSPITAL_COMMUNITY): Payer: Medicare Other

## 2018-05-16 ENCOUNTER — Other Ambulatory Visit: Payer: Self-pay

## 2018-05-16 ENCOUNTER — Ambulatory Visit (HOSPITAL_COMMUNITY)
Admission: RE | Admit: 2018-05-16 | Discharge: 2018-05-16 | Disposition: A | Discharge status: Home / Self Care | Payer: Medicare Other | Source: Ambulatory Visit | Attending: Vascular Surgery | Admitting: Vascular Surgery

## 2018-05-16 DIAGNOSIS — Z01818 Encounter for other preprocedural examination: Secondary | ICD-10-CM | POA: Diagnosis not present

## 2018-05-16 DIAGNOSIS — I7100 Dissection of unspecified site of aorta: Secondary | ICD-10-CM | POA: Insufficient documentation

## 2018-05-16 DIAGNOSIS — I7772 Dissection of iliac artery: Secondary | ICD-10-CM | POA: Diagnosis not present

## 2018-05-16 DIAGNOSIS — I723 Aneurysm of iliac artery: Secondary | ICD-10-CM | POA: Insufficient documentation

## 2018-05-16 DIAGNOSIS — I70229 Atherosclerosis of native arteries of extremities with rest pain, unspecified extremity: Secondary | ICD-10-CM

## 2018-05-16 DIAGNOSIS — I998 Other disorder of circulatory system: Secondary | ICD-10-CM | POA: Diagnosis not present

## 2018-05-16 DIAGNOSIS — I745 Embolism and thrombosis of iliac artery: Secondary | ICD-10-CM | POA: Diagnosis not present

## 2018-05-16 DIAGNOSIS — I708 Atherosclerosis of other arteries: Secondary | ICD-10-CM | POA: Diagnosis not present

## 2018-05-16 MED ORDER — IOPAMIDOL (ISOVUE-370) INJECTION 76%
INTRAVENOUS | Status: AC
  Filled 2018-05-16: qty 100

## 2018-05-16 MED ORDER — IOPAMIDOL (ISOVUE-370) INJECTION 76%
100.0000 mL | Freq: Once | INTRAVENOUS | Status: AC | PRN
  Administered 2018-05-16: 100 mL via INTRAVENOUS

## 2018-05-16 NOTE — H&P (View-Only) (Signed)
Established Critical Limb Ischemia Patient   History of Present Illness   Jacob Avery is a 72 y.o. (1946-08-04) male who presents with chief complaint: bilateral (R>L) foot pain.  The patient has rest pain and wounds include: Right foot wounds.  The patient notes symptoms have not progressed.  The patient denies any fever or chills.  Pt was sent of CTA abd/pelvis to evaluate for chronic aortic dissection and also cardiac preop risk stratification: low risk.  Past Medical History:  Diagnosis Date  . Arthritis   . Chronic kidney disease    Stage 2  . CVA (cerebral vascular accident) ([place]) 10/2014    Late effect of cerebrovascular disease.  Mostly resolved, but can see difference in facial expressions.  tp-A, Plavix afterward  . Diabetes mellitus without complication ([place])    type II, controlled, with renal comps  . Diabetic foot ([place]) 04/05/2018   Normal sensation, skin intact  . Diverticulosis   . ED (erectile dysfunction)   . GI bleed 04/2017  . Gout   . Hx of adenomatous polyp of rectum 06/10/2017  . Hypercholesterolemia   . Hypertension   . Long term (current) use of insulin ([place])    Remains on basal/bolus therapy without hypoglycemia.  . Microalbuminuria 2013  . Morbid obesity ([place])   . Obesity   . OSA (obstructive sleep apnea)    Intolerant to CPAP  . Peripheral vascular disease ([place])   . Right calf pain 04/05/2018  . Vertigo     Past Surgical History:  Procedure Laterality Date  . ABDOMINAL AORTOGRAM N/A 04/13/2018   Procedure: ABDOMINAL AORTOGRAM;  Surgeon: Conrad Tyrone, MD;  Location: [facility];  Service: Cardiovascular;  Laterality: N/A;  . COLONOSCOPY WITH PROPOFOL N/A 06/08/2017   Procedure: COLONOSCOPY WITH PROPOFOL ( Ultra-Slim Scope);  Surgeon: Gatha Mayer, MD;  Location: [facility];  Service: Endoscopy;  Laterality: N/A;  . LOWER EXTREMITY ANGIOGRAPHY N/A 04/13/2018   Procedure: LOWER EXTREMITY ANGIOGRAPHY;  Surgeon: Conrad Waverly, MD;   Location: [facility];  Service: Cardiovascular;  Laterality: N/A;  bilateral   . UMBILICAL HERNIA REPAIR      Social History   Socioeconomic History  . Marital status: Married    Spouse name: [name]  . Number of children: 2  . Years of education: hs gr  . Highest education level: Not on file  Occupational History  . Occupation: retired    Comment: Education administrator  . Financial resource strain: Not on file  . Food insecurity:    Worry: Not on file    Inability: Not on file  . Transportation needs:    Medical: Not on file    Non-medical: Not on file  Tobacco Use  . Smoking status: Current Every Day Smoker    Packs/day: 1.00  . Smokeless tobacco: Never Used  Substance and Sexual Activity  . Alcohol use: No    Alcohol/week: 0.0 oz  . Drug use: No  . Sexual activity: Not Currently    Partners: Female    Birth control/protection: None  Lifestyle  . Physical activity:    Days per week: Not on file    Minutes per session: Not on file  . Stress: Not on file  Relationships  . Social connections:    Talks on phone: Not on file    Gets together: Not on file    Attends religious service: Not on file    Active member of club or  organization: Not on file    Attends meetings of clubs or organizations: Not on file    Relationship status: Not on file  . Intimate partner violence:    Fear of current or ex partner: Not on file    Emotionally abused: Not on file    Physically abused: Not on file    Forced sexual activity: Not on file  Other Topics Concern  . Not on file  Social History Narrative   Patient is married with 2 children.   Patient is right handed.   Patient has hs education.   Patient drinks tea on sundays, sodas occ.    Family History  Problem Relation Age of Onset  . CVA Mother   . Alzheimer's disease Mother   . Heart attack Father   . Heart disease Father   . Diabetes Brother   . Rectal cancer Brother   . Stomach cancer Neg Hx   . Colon  cancer Neg Hx     Current Outpatient Medications  Medication Sig Dispense Refill  . acetaminophen (TYLENOL ARTHRITIS PAIN) 650 MG CR tablet Take 1,300 mg by mouth every 8 (eight) hours as needed for pain.     Marland Kitchen amLODipine (NORVASC) 10 MG tablet Take 10 mg by mouth every evening.     Marland Kitchen aspirin EC 81 MG tablet Take 81 mg by mouth daily.    Marland Kitchen atorvastatin (LIPITOR) 80 MG tablet Take 80 mg by mouth daily.    . clopidogrel (PLAVIX) 75 MG tablet Take 75 mg by mouth daily.     . colchicine (COLCRYS) 0.6 MG tablet Take 0.6 mg by mouth daily as needed (for gout flare ups.).     Marland Kitchen ezetimibe (ZETIA) 10 MG tablet Take 10 mg by mouth daily.    . ferrous sulfate 325 (65 FE) MG tablet Take 325 mg by mouth daily with breakfast.    . insulin lispro (HUMALOG) 100 UNIT/ML KiwkPen Inject 10 Units into the skin 2 (two) times daily before a meal.     . LANTUS SOLOSTAR 100 UNIT/ML Solostar Pen Inject 30-45 Units into the skin See admin instructions. Inject 45 units subcutaneously in the morning, and 30 units subcutaneously in the evening    . losartan-hydrochlorothiazide (HYZAAR) 100-25 MG per tablet Take 1 tablet by mouth daily.    . Oxymetazoline HCl (VICKS SINEX 12 HOUR NA) Place 1 spray into both nostrils daily as needed (congestion).     No current facility-administered medications for this visit.      No Known Allergies  REVIEW OF SYSTEMS (negative unless checked):   Cardiac:  []  Chest pain or chest pressure? [x]  Shortness of breath upon activity? []  Shortness of breath when lying flat? []  Irregular heart rhythm?  Vascular:  [x]  Pain in calf, thigh, or hip brought on by walking? [x]  Pain in feet at night that wakes you up from your sleep? []  Blood clot in your veins? []  Leg swelling?  Pulmonary:  []  Oxygen at home? []  Productive cough? []  Wheezing?  Neurologic:  []  Sudden weakness in arms or legs? []  Sudden numbness in arms or legs? []  Sudden onset of difficult speaking or slurred  speech? []  Temporary loss of vision in one eye? []  Problems with dizziness?  Gastrointestinal:  []  Blood in stool? []  Vomited blood?  Genitourinary:  []  Burning when urinating? []  Blood in urine?  Psychiatric:  []  Major depression  Hematologic:  []  Bleeding problems? []  Problems with blood clotting?  Dermatologic:  []  Rashes or  ulcers?  Constitutional:  []  Fever or chills?  Ear/Nose/Throat:  []  Change in hearing? []  Nose bleeds? []  Sore throat?  Musculoskeletal:  []  Back pain? []  Joint pain? []  Muscle pain?   Physical Examination   Vitals:   05/19/18 1034  BP: 132/78  Pulse: (!) 55  Resp: 18  SpO2: 96%  Weight: 277 lb 12.8 oz (126 kg)  Height: 5\' 7"  (1.702 m)   Body mass index is 43.51 kg/m.  General Alert, O x 3, WD, NAD  Pulmonary Sym exp, good B air movt, CTA B  Cardiac RRR, Nl S1, S2, no Murmurs, No rubs, No S3,S4  Vascular Vessel Right Left  Radial Palpable Palpable  Brachial Palpable Palpable  Carotid Palpable, No Bruit Palpable, No Bruit  Aorta Not palpable N/A  Femoral Not palpable Not palpable  Popliteal Not palpable Not palpable  PT Not palpable Not palpable  DP Not palpable Not palpable    Gastro- intestinal soft, non-distended, non-tender to palpation, No guarding or rebound, no HSM, no masses, no CVAT B, No palpable prominent aortic pulse,    Musculo- skeletal M/S 5/5 throughout  , Extremities without ischemic changes  , No edema present, No visible varicosities , No Lipodermatosclerosis present  Neurologic Cranial nerves 2-12 intact , Pain and light touch intact in extremities , Motor exam as listed above    Radiology     CTA Abd/pelvis (05/16/18): 1. No evidence of aneurysmal disease, dissection or significant atherosclerosis involving the thoracic aorta. 2. Complex chronic appearing partial dissection of the distal aorta extending into bilateral iliac arteries. Associated aneurysmal disease of the distal aorta measures 4 cm  in greatest diameter. 3. In the right lower extremity, dissection in the common iliac artery does not cause visible luminal restriction. 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 Visible reconstitution in the thigh of profunda femoral branches and the proximal SFA. See above for measurements of the common and internal iliac arteries. 4. In the left lower extremity, complex dissection affects the common iliac artery with associated aneurysmal disease of the distal common iliac artery measuring 2.3 cm. There is significant stenosis at the origin of the left external iliac artery and component of probable dissection in the mid external iliac artery causing 50% stenosis. The common femoral artery shows roughly 60% proximal stenosis. The proximal left SFA appears diffusely stenotic.  I reviewed the CTA, I can't clearly see the proximal origin of this patient's chronic aortic dissection.  I also can't clearly delineate the anatomy going into the iliac segments due to the poor tissue penetration due to this patient's obesity.   Medical Decision Making   Jacob Avery is a 72 y.o. male who presents with: BLE critical limb ischemia with right foot wounds, chronic aortic dissection, multiple co-morbidities.   Patient's obesity will adversely impact any procedure but I have especial concerns with AB.  Due to the depth of the aorta and extensive fat, increased risk of wound infections would be expected.  Additionally, I have concerns that the proximal extent of the aortic dissection is not visualized.  Most dissections do NOT start in the infrarenal aortic segment.    Depending on the anatomy of the dissection, sewing the proximal anastomosis might be problematic.  Subsequently, I recommended L axillo-femoro-femoral bypass with likely extensive biliateral femoral endarterectomies. The  risk, benefits, and alternative for bypass operations were discussed with the patient.  The patient is aware the risks include but are not limited to: bleeding, infection, myocardial infarction, stroke, limb loss, nerve damage, limb edema, need for additional procedures in the future, wound complications, and inability to complete the bypass.  The patient is aware of these risks and agreed to proceed.  He is scheduled on 30 JUL 19. I am scheduling him with Dr. Scot Dock to speed up the procedure, in the event that concomittant femoropopliteal bypass will be needed on the table.  I discussed in depth with the patient the nature of atherosclerosis, and emphasized the importance of maximal medical management including strict control of blood pressure, blood glucose, and lipid levels, antiplatelet agents, obtaining regular exercise, and cessation of smoking.    The patient is aware that without maximal medical management the underlying atherosclerotic disease process will progress, limiting the benefit of any interventions.  The patient is currently on a statin: Lipitor.   The patient is currently on an anti-platelet: ASA.  Thank you for allowing [place] to participate in this patient's care.   Adele Barthel, MD, FACS [facility] Office: [phone] Pager: [phone]

## 2018-05-16 NOTE — Progress Notes (Signed)
Established Critical Limb Ischemia Patient   History of Present Illness   Jacob Avery is a 72 y.o. (Sep 28, 1946) male who presents with chief complaint: bilateral (R>L) foot pain.  The patient has rest pain and wounds include: Right foot wounds.  The patient notes symptoms have not progressed.  The patient denies any fever or chills.  Pt was sent of CTA abd/pelvis to evaluate for chronic aortic dissection and also cardiac preop risk stratification: low risk.  Past Medical History:  Diagnosis Date  . Arthritis   . Chronic kidney disease    Stage 2  . CVA (cerebral vascular accident) ([place]) 10/2014    Late effect of cerebrovascular disease.  Mostly resolved, but can see difference in facial expressions.  tp-A, Plavix afterward  . Diabetes mellitus without complication ([place])    type II, controlled, with renal comps  . Diabetic foot ([place]) 04/05/2018   Normal sensation, skin intact  . Diverticulosis   . ED (erectile dysfunction)   . GI bleed 04/2017  . Gout   . Hx of adenomatous polyp of rectum 06/10/2017  . Hypercholesterolemia   . Hypertension   . Long term (current) use of insulin ([place])    Remains on basal/bolus therapy without hypoglycemia.  . Microalbuminuria 2013  . Morbid obesity ([place])   . Obesity   . OSA (obstructive sleep apnea)    Intolerant to CPAP  . Peripheral vascular disease ([place])   . Right calf pain 04/05/2018  . Vertigo     Past Surgical History:  Procedure Laterality Date  . ABDOMINAL AORTOGRAM N/A 04/13/2018   Procedure: ABDOMINAL AORTOGRAM;  Surgeon: Conrad [place], MD;  Location: [facility];  Service: Cardiovascular;  Laterality: N/A;  . COLONOSCOPY WITH PROPOFOL N/A 06/08/2017   Procedure: COLONOSCOPY WITH PROPOFOL ( Ultra-Slim Scope);  Surgeon: Gatha Mayer, MD;  Location: [facility];  Service: Endoscopy;  Laterality: N/A;  . LOWER EXTREMITY ANGIOGRAPHY N/A 04/13/2018   Procedure: LOWER EXTREMITY ANGIOGRAPHY;  Surgeon: Conrad [place], MD;   Location: [facility];  Service: Cardiovascular;  Laterality: N/A;  bilateral   . UMBILICAL HERNIA REPAIR      Social History   Socioeconomic History  . Marital status: Married    Spouse name: [name]  . Number of children: 2  . Years of education: hs gr  . Highest education level: Not on file  Occupational History  . Occupation: retired    Comment: Education administrator  . Financial resource strain: Not on file  . Food insecurity:    Worry: Not on file    Inability: Not on file  . Transportation needs:    Medical: Not on file    Non-medical: Not on file  Tobacco Use  . Smoking status: Current Every Day Smoker    Packs/day: 1.00  . Smokeless tobacco: Never Used  Substance and Sexual Activity  . Alcohol use: No    Alcohol/week: 0.0 oz  . Drug use: No  . Sexual activity: Not Currently    Partners: Female    Birth control/protection: None  Lifestyle  . Physical activity:    Days per week: Not on file    Minutes per session: Not on file  . Stress: Not on file  Relationships  . Social connections:    Talks on phone: Not on file    Gets together: Not on file    Attends religious service: Not on file    Active member of club or  organization: Not on file    Attends meetings of clubs or organizations: Not on file    Relationship status: Not on file  . Intimate partner violence:    Fear of current or ex partner: Not on file    Emotionally abused: Not on file    Physically abused: Not on file    Forced sexual activity: Not on file  Other Topics Concern  . Not on file  Social History Narrative   Patient is married with 2 children.   Patient is right handed.   Patient has hs education.   Patient drinks tea on sundays, sodas occ.    Family History  Problem Relation Age of Onset  . CVA Mother   . Alzheimer's disease Mother   . Heart attack Father   . Heart disease Father   . Diabetes Brother   . Rectal cancer Brother   . Stomach cancer Neg Hx   . Colon  cancer Neg Hx     Current Outpatient Medications  Medication Sig Dispense Refill  . acetaminophen (TYLENOL ARTHRITIS PAIN) 650 MG CR tablet Take 1,300 mg by mouth every 8 (eight) hours as needed for pain.     Marland Kitchen amLODipine (NORVASC) 10 MG tablet Take 10 mg by mouth every evening.     Marland Kitchen aspirin EC 81 MG tablet Take 81 mg by mouth daily.    Marland Kitchen atorvastatin (LIPITOR) 80 MG tablet Take 80 mg by mouth daily.    . clopidogrel (PLAVIX) 75 MG tablet Take 75 mg by mouth daily.     . colchicine (COLCRYS) 0.6 MG tablet Take 0.6 mg by mouth daily as needed (for gout flare ups.).     Marland Kitchen ezetimibe (ZETIA) 10 MG tablet Take 10 mg by mouth daily.    . ferrous sulfate 325 (65 FE) MG tablet Take 325 mg by mouth daily with breakfast.    . insulin lispro (HUMALOG) 100 UNIT/ML KiwkPen Inject 10 Units into the skin 2 (two) times daily before a meal.     . LANTUS SOLOSTAR 100 UNIT/ML Solostar Pen Inject 30-45 Units into the skin See admin instructions. Inject 45 units subcutaneously in the morning, and 30 units subcutaneously in the evening    . losartan-hydrochlorothiazide (HYZAAR) 100-25 MG per tablet Take 1 tablet by mouth daily.    . Oxymetazoline HCl (VICKS SINEX 12 HOUR NA) Place 1 spray into both nostrils daily as needed (congestion).     No current facility-administered medications for this visit.      No Known Allergies  REVIEW OF SYSTEMS (negative unless checked):   Cardiac:  []  Chest pain or chest pressure? [x]  Shortness of breath upon activity? []  Shortness of breath when lying flat? []  Irregular heart rhythm?  Vascular:  [x]  Pain in calf, thigh, or hip brought on by walking? [x]  Pain in feet at night that wakes you up from your sleep? []  Blood clot in your veins? []  Leg swelling?  Pulmonary:  []  Oxygen at home? []  Productive cough? []  Wheezing?  Neurologic:  []  Sudden weakness in arms or legs? []  Sudden numbness in arms or legs? []  Sudden onset of difficult speaking or slurred  speech? []  Temporary loss of vision in one eye? []  Problems with dizziness?  Gastrointestinal:  []  Blood in stool? []  Vomited blood?  Genitourinary:  []  Burning when urinating? []  Blood in urine?  Psychiatric:  []  Major depression  Hematologic:  []  Bleeding problems? []  Problems with blood clotting?  Dermatologic:  []  Rashes or  ulcers?  Constitutional:  []  Fever or chills?  Ear/Nose/Throat:  []  Change in hearing? []  Nose bleeds? []  Sore throat?  Musculoskeletal:  []  Back pain? []  Joint pain? []  Muscle pain?   Physical Examination   Vitals:   05/19/18 1034  BP: 132/78  Pulse: (!) 55  Resp: 18  SpO2: 96%  Weight: 277 lb 12.8 oz (126 kg)  Height: 5\' 7"  (1.702 m)   Body mass index is 43.51 kg/m.  General Alert, O x 3, WD, NAD  Pulmonary Sym exp, good B air movt, CTA B  Cardiac RRR, Nl S1, S2, no Murmurs, No rubs, No S3,S4  Vascular Vessel Right Left  Radial Palpable Palpable  Brachial Palpable Palpable  Carotid Palpable, No Bruit Palpable, No Bruit  Aorta Not palpable N/A  Femoral Not palpable Not palpable  Popliteal Not palpable Not palpable  PT Not palpable Not palpable  DP Not palpable Not palpable    Gastro- intestinal soft, non-distended, non-tender to palpation, No guarding or rebound, no HSM, no masses, no CVAT B, No palpable prominent aortic pulse,    Musculo- skeletal M/S 5/5 throughout  , Extremities without ischemic changes  , No edema present, No visible varicosities , No Lipodermatosclerosis present  Neurologic Cranial nerves 2-12 intact , Pain and light touch intact in extremities , Motor exam as listed above    Radiology     CTA Abd/pelvis (05/16/18): 1. No evidence of aneurysmal disease, dissection or significant atherosclerosis involving the thoracic aorta. 2. Complex chronic appearing partial dissection of the distal aorta extending into bilateral iliac arteries. Associated aneurysmal disease of the distal aorta measures 4 cm  in greatest diameter. 3. In the right lower extremity, dissection in the common iliac artery does not cause visible luminal restriction. 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 Visible reconstitution in the thigh of profunda femoral branches and the proximal SFA. See above for measurements of the common and internal iliac arteries. 4. In the left lower extremity, complex dissection affects the common iliac artery with associated aneurysmal disease of the distal common iliac artery measuring 2.3 cm. There is significant stenosis at the origin of the left external iliac artery and component of probable dissection in the mid external iliac artery causing 50% stenosis. The common femoral artery shows roughly 60% proximal stenosis. The proximal left SFA appears diffusely stenotic.  I reviewed the CTA, I can't clearly see the proximal origin of this patient's chronic aortic dissection.  I also can't clearly delineate the anatomy going into the iliac segments due to the poor tissue penetration due to this patient's obesity.   Medical Decision Making   Jacob Avery is a 72 y.o. male who presents with: BLE critical limb ischemia with right foot wounds, chronic aortic dissection, multiple co-morbidities.   Patient's obesity will adversely impact any procedure but I have especial concerns with AB.  Due to the depth of the aorta and extensive fat, increased risk of wound infections would be expected.  Additionally, I have concerns that the proximal extent of the aortic dissection is not visualized.  Most dissections do NOT start in the infrarenal aortic segment.    Depending on the anatomy of the dissection, sewing the proximal anastomosis might be problematic.  Subsequently, I recommended L axillo-femoro-femoral bypass with likely extensive biliateral femoral endarterectomies. The  risk, benefits, and alternative for bypass operations were discussed with the patient.  The patient is aware the risks include but are not limited to: bleeding, infection, myocardial infarction, stroke, limb loss, nerve damage, limb edema, need for additional procedures in the future, wound complications, and inability to complete the bypass.  The patient is aware of these risks and agreed to proceed.  He is scheduled on 30 JUL 19. I am scheduling him with Dr. Scot Dock to speed up the procedure, in the event that concomittant femoropopliteal bypass will be needed on the table.  I discussed in depth with the patient the nature of atherosclerosis, and emphasized the importance of maximal medical management including strict control of blood pressure, blood glucose, and lipid levels, antiplatelet agents, obtaining regular exercise, and cessation of smoking.    The patient is aware that without maximal medical management the underlying atherosclerotic disease process will progress, limiting the benefit of any interventions.  The patient is currently on a statin: Lipitor.   The patient is currently on an anti-platelet: ASA.  Thank you for allowing [place] to participate in this patient's care.   Adele Barthel, MD, FACS [facility] Office: [phone] Pager: [phone]

## 2018-05-17 ENCOUNTER — Ambulatory Visit: Payer: Medicare Other | Admitting: Vascular Surgery

## 2018-05-19 ENCOUNTER — Other Ambulatory Visit: Payer: Self-pay | Admitting: *Deleted

## 2018-05-19 ENCOUNTER — Ambulatory Visit (INDEPENDENT_AMBULATORY_CARE_PROVIDER_SITE_OTHER): Payer: Medicare Other | Admitting: Vascular Surgery

## 2018-05-19 ENCOUNTER — Encounter: Payer: Self-pay | Admitting: *Deleted

## 2018-05-19 ENCOUNTER — Encounter: Payer: Self-pay | Admitting: Vascular Surgery

## 2018-05-19 ENCOUNTER — Other Ambulatory Visit: Payer: Self-pay

## 2018-05-19 VITALS — BP 132/78 | HR 55 | Resp 18 | Ht 67.0 in | Wt 277.8 lb

## 2018-05-19 DIAGNOSIS — I998 Other disorder of circulatory system: Secondary | ICD-10-CM

## 2018-05-19 DIAGNOSIS — I70229 Atherosclerosis of native arteries of extremities with rest pain, unspecified extremity: Secondary | ICD-10-CM

## 2018-05-22 ENCOUNTER — Other Ambulatory Visit: Payer: Self-pay

## 2018-05-22 DIAGNOSIS — I998 Other disorder of circulatory system: Secondary | ICD-10-CM

## 2018-05-22 DIAGNOSIS — Z01812 Encounter for preprocedural laboratory examination: Secondary | ICD-10-CM

## 2018-05-22 DIAGNOSIS — I70229 Atherosclerosis of native arteries of extremities with rest pain, unspecified extremity: Secondary | ICD-10-CM

## 2018-05-22 DIAGNOSIS — I7100 Dissection of unspecified site of aorta: Secondary | ICD-10-CM

## 2018-05-22 DIAGNOSIS — I739 Peripheral vascular disease, unspecified: Secondary | ICD-10-CM

## 2018-05-23 ENCOUNTER — Ambulatory Visit (HOSPITAL_COMMUNITY)
Admission: RE | Admit: 2018-05-23 | Discharge: 2018-05-23 | Disposition: A | Discharge status: Home / Self Care | Payer: Medicare Other | Source: Ambulatory Visit | Attending: Vascular Surgery | Admitting: Vascular Surgery

## 2018-05-23 DIAGNOSIS — M79603 Pain in arm, unspecified: Secondary | ICD-10-CM | POA: Insufficient documentation

## 2018-05-23 DIAGNOSIS — I739 Peripheral vascular disease, unspecified: Secondary | ICD-10-CM | POA: Diagnosis not present

## 2018-05-23 DIAGNOSIS — I998 Other disorder of circulatory system: Secondary | ICD-10-CM | POA: Diagnosis not present

## 2018-05-23 DIAGNOSIS — I70229 Atherosclerosis of native arteries of extremities with rest pain, unspecified extremity: Secondary | ICD-10-CM

## 2018-05-23 DIAGNOSIS — Z01812 Encounter for preprocedural laboratory examination: Secondary | ICD-10-CM | POA: Diagnosis not present

## 2018-05-25 NOTE — Pre-Procedure Instructions (Signed)
Delle Reining  05/25/2018      [facility] - [place], [place] - [address] [id number] [address] [place] [address] Phone: [phone] Fax: [phone]  [place] [facility] [place] [address] [place] [address] [place] [address] Phone: [phone] Fax: [phone]    Your procedure is scheduled on Tuesday July 30.  Report to [facility] Admitting at 5:30 A.M.  Call this number if you have problems the morning of surgery:  [phone]   Remember:  Do not eat or drink after midnight.   Take these medicines the morning of surgery with A SIP OF WATER:   Amlodipine (norvasc) Colchicine if needed Tylenol if needed  IF blood surgery greater than 200 the morning of surgery, you may take 1/2 of usual correction dose of insulin lispro (Humalog)  Take half dose of Lantus insulin the night before surgery (15 units) Take half dose of Lantus insulin the morning of surgery (20 units)  7 days prior to surgery STOP Aleve, Naproxen, Ibuprofen, Motrin, Advil, Goody's, BC's, all herbal medications, fish oil, and all vitamins  Follow your surgeon's instructions on stopping Aspirin and Plavix (Clopidogrel)     How to Manage Your Diabetes Before and After Surgery  Why is it important to control my blood sugar before and after surgery? . Improving blood sugar levels before and after surgery helps healing and can limit problems. . A way of improving blood sugar control is eating a healthy diet by: o  Eating less sugar and carbohydrates o  Increasing activity/exercise o  Talking with your doctor about reaching your blood sugar goals . High blood sugars (greater than 180 mg/dL) can raise your risk of infections and slow your recovery, so you will need to focus on controlling your diabetes during the weeks before surgery. . Make sure that the doctor who takes care of your diabetes knows about your planned surgery  including the date and location.  How do I manage my blood sugar before surgery? . Check your blood sugar at least 4 times a day, starting 2 days before surgery, to make sure that the level is not too high or low. o Check your blood sugar the morning of your surgery when you wake up and every 2 hours until you get to the Short Stay unit. . If your blood sugar is less than 70 mg/dL, you will need to treat for low blood sugar: o Do not take insulin. o Treat a low blood sugar (less than 70 mg/dL) with  cup of clear juice (cranberry or apple), 4 glucose tablets, OR glucose gel. Recheck blood sugar in 15 minutes after treatment (to make sure it is greater than 70 mg/dL). If your blood sugar is not greater than 70 mg/dL on recheck, call [phone] o  for further instructions. . Report your blood sugar to the short stay nurse when you get to Short Stay.  . If you are admitted to the hospital after surgery: o Your blood sugar will be checked by the staff and you will probably be given insulin after surgery (instead of oral diabetes medicines) to make sure you have good blood sugar levels. o The goal for blood sugar control after surgery is 80-180 mg/dL.              Do not wear jewelry, make-up or nail polish.  Do not wear lotions, powders, or perfumes, or deodorant.  Do not  shave 48 hours prior to surgery.  Men may shave face and neck.  Do not bring valuables to the hospital.  [facility] is not responsible for any belongings or valuables.  Contacts, dentures or bridgework may not be worn into surgery.  Leave your suitcase in the car.  After surgery it may be brought to your room.  For patients admitted to the hospital, discharge time will be determined by your treatment team.  Patients discharged the day of surgery will not be allowed to drive home.   Special instructions:     [place]- Preparing For Surgery  Before surgery, you can play an important role. Because skin is  not sterile, your skin needs to be as free of germs as possible. You can reduce the number of germs on your skin by washing with CHG (chlorahexidine gluconate) Soap before surgery.  CHG is an antiseptic cleaner which kills germs and bonds with the skin to continue killing germs even after washing.    Oral Hygiene is also important to reduce your risk of infection.  Remember - BRUSH YOUR TEETH THE MORNING OF SURGERY WITH YOUR REGULAR TOOTHPASTE  Please do not use if you have an allergy to CHG or antibacterial soaps. If your skin becomes reddened/irritated stop using the CHG.  Do not shave (including legs and underarms) for at least 48 hours prior to first CHG shower. It is OK to shave your face.  Please follow these instructions carefully.   1. Shower the NIGHT BEFORE SURGERY and the MORNING OF SURGERY with CHG.   2. If you chose to wash your hair, wash your hair first as usual with your normal shampoo.  3. After you shampoo, rinse your hair and body thoroughly to remove the shampoo.  4. Use CHG as you would any other liquid soap. You can apply CHG directly to the skin and wash gently with a scrungie or a clean washcloth.   5. Apply the CHG Soap to your body ONLY FROM THE NECK DOWN.  Do not use on open wounds or open sores. Avoid contact with your eyes, ears, mouth and genitals (private parts). Wash Face and genitals (private parts)  with your normal soap.  6. Wash thoroughly, paying special attention to the area where your surgery will be performed.  7. Thoroughly rinse your body with warm water from the neck down.  8. DO NOT shower/wash with your normal soap after using and rinsing off the CHG Soap.  9. Pat yourself dry with a CLEAN TOWEL.  10. Wear CLEAN PAJAMAS to bed the night before surgery, wear comfortable clothes the morning of surgery  11. Place CLEAN SHEETS on your bed the night of your first shower and DO NOT SLEEP WITH PETS.    Day of Surgery:  Do not apply any  deodorants/lotions.  Please wear clean clothes to the hospital/surgery center.   Remember to brush your teeth WITH YOUR REGULAR TOOTHPASTE.    Please read over the following fact sheets that you were given. Coughing and Deep Breathing, MRSA Information and Surgical Site Infection Prevention

## 2018-05-26 ENCOUNTER — Other Ambulatory Visit: Payer: Self-pay

## 2018-05-26 ENCOUNTER — Encounter (HOSPITAL_COMMUNITY)
Admission: RE | Admit: 2018-05-26 | Discharge: 2018-05-26 | Disposition: A | Discharge status: Home / Self Care | Payer: Medicare Other | Source: Ambulatory Visit | Attending: Vascular Surgery | Admitting: Vascular Surgery

## 2018-05-26 ENCOUNTER — Encounter (HOSPITAL_COMMUNITY): Payer: Self-pay

## 2018-05-26 DIAGNOSIS — Z01812 Encounter for preprocedural laboratory examination: Secondary | ICD-10-CM | POA: Insufficient documentation

## 2018-05-26 DIAGNOSIS — I129 Hypertensive chronic kidney disease with stage 1 through stage 4 chronic kidney disease, or unspecified chronic kidney disease: Secondary | ICD-10-CM | POA: Diagnosis not present

## 2018-05-26 DIAGNOSIS — Z8673 Personal history of transient ischemic attack (TIA), and cerebral infarction without residual deficits: Secondary | ICD-10-CM | POA: Insufficient documentation

## 2018-05-26 DIAGNOSIS — E1122 Type 2 diabetes mellitus with diabetic chronic kidney disease: Secondary | ICD-10-CM | POA: Insufficient documentation

## 2018-05-26 DIAGNOSIS — G4733 Obstructive sleep apnea (adult) (pediatric): Secondary | ICD-10-CM | POA: Insufficient documentation

## 2018-05-26 DIAGNOSIS — I739 Peripheral vascular disease, unspecified: Secondary | ICD-10-CM | POA: Insufficient documentation

## 2018-05-26 DIAGNOSIS — Z0183 Encounter for blood typing: Secondary | ICD-10-CM | POA: Insufficient documentation

## 2018-05-26 DIAGNOSIS — Z7982 Long term (current) use of aspirin: Secondary | ICD-10-CM | POA: Diagnosis not present

## 2018-05-26 DIAGNOSIS — F40240 Claustrophobia: Secondary | ICD-10-CM | POA: Insufficient documentation

## 2018-05-26 DIAGNOSIS — M1990 Unspecified osteoarthritis, unspecified site: Secondary | ICD-10-CM | POA: Diagnosis not present

## 2018-05-26 DIAGNOSIS — N182 Chronic kidney disease, stage 2 (mild): Secondary | ICD-10-CM | POA: Insufficient documentation

## 2018-05-26 DIAGNOSIS — Z01818 Encounter for other preprocedural examination: Secondary | ICD-10-CM | POA: Diagnosis not present

## 2018-05-26 DIAGNOSIS — Z79899 Other long term (current) drug therapy: Secondary | ICD-10-CM | POA: Diagnosis not present

## 2018-05-26 DIAGNOSIS — Z7902 Long term (current) use of antithrombotics/antiplatelets: Secondary | ICD-10-CM | POA: Insufficient documentation

## 2018-05-26 DIAGNOSIS — I998 Other disorder of circulatory system: Secondary | ICD-10-CM | POA: Diagnosis not present

## 2018-05-26 DIAGNOSIS — Z794 Long term (current) use of insulin: Secondary | ICD-10-CM | POA: Diagnosis not present

## 2018-05-26 DIAGNOSIS — Z6841 Body Mass Index (BMI) 40.0 and over, adult: Secondary | ICD-10-CM | POA: Insufficient documentation

## 2018-05-26 DIAGNOSIS — Z87891 Personal history of nicotine dependence: Secondary | ICD-10-CM | POA: Insufficient documentation

## 2018-05-26 DIAGNOSIS — E7800 Pure hypercholesterolemia, unspecified: Secondary | ICD-10-CM | POA: Diagnosis not present

## 2018-05-26 HISTORY — DX: Personal history of other mental and behavioral disorders: Z86.59

## 2018-05-26 LAB — PROTIME-INR
INR: 0.97
Prothrombin Time: 12.8 seconds (ref 11.4–15.2)

## 2018-05-26 LAB — PREPARE RBC (CROSSMATCH)

## 2018-05-26 LAB — CBC
HEMATOCRIT: 40.4 % (ref 39.0–52.0)
HEMOGLOBIN: 12.2 g/dL — AB (ref 13.0–17.0)
MCH: 24.6 pg — ABNORMAL LOW (ref 26.0–34.0)
MCHC: 30.2 g/dL (ref 30.0–36.0)
MCV: 81.5 fL (ref 78.0–100.0)
Platelets: 283 10*3/uL (ref 150–400)
RBC: 4.96 MIL/uL (ref 4.22–5.81)
RDW: 17.2 % — ABNORMAL HIGH (ref 11.5–15.5)
WBC: 8.9 10*3/uL (ref 4.0–10.5)

## 2018-05-26 LAB — COMPREHENSIVE METABOLIC PANEL
ALBUMIN: 3.3 g/dL — AB (ref 3.5–5.0)
ALK PHOS: 73 U/L (ref 38–126)
ALT: 17 U/L (ref 0–44)
AST: 18 U/L (ref 15–41)
Anion gap: 11 (ref 5–15)
BILIRUBIN TOTAL: 0.5 mg/dL (ref 0.3–1.2)
BUN: 17 mg/dL (ref 8–23)
CALCIUM: 8.6 mg/dL — AB (ref 8.9–10.3)
CO2: 20 mmol/L — ABNORMAL LOW (ref 22–32)
Chloride: 107 mmol/L (ref 98–111)
Creatinine, Ser: 1.08 mg/dL (ref 0.61–1.24)
GFR calc Af Amer: >60 mL/min (ref >60)
GFR calc non Af Amer: >60 mL/min (ref >60)
GLUCOSE: 204 mg/dL — AB (ref 70–99)
Potassium: 3.9 mmol/L (ref 3.5–5.1)
Sodium: 138 mmol/L (ref 135–145)
TOTAL PROTEIN: 6.4 g/dL — AB (ref 6.5–8.1)

## 2018-05-26 LAB — URINALYSIS, ROUTINE W REFLEX MICROSCOPIC
Bilirubin Urine: NEGATIVE
GLUCOSE, UA: NEGATIVE mg/dL
KETONES UR: NEGATIVE mg/dL
NITRITE: NEGATIVE
PH: 5 (ref 5.0–8.0)
Protein, ur: NEGATIVE mg/dL
Specific Gravity, Urine: 1.013 (ref 1.005–1.030)

## 2018-05-26 LAB — BLOOD GAS, ARTERIAL
Acid-Base Excess: 0.4 mmol/L (ref 0.0–2.0)
Bicarbonate: 24.2 mmol/L (ref 20.0–28.0)
Drawn by: 449841
FIO2: 21
O2 Saturation: 98.1 %
PATIENT TEMPERATURE: 98.6
pCO2 arterial: 37.3 mmHg (ref 32.0–48.0)
pH, Arterial: 7.428 (ref 7.350–7.450)
pO2, Arterial: 113 mmHg — ABNORMAL HIGH (ref 83.0–108.0)

## 2018-05-26 LAB — HEMOGLOBIN A1C
Hgb A1c MFr Bld: 7.6 % — ABNORMAL HIGH (ref 4.8–5.6)
Mean Plasma Glucose: 171.42 mg/dL

## 2018-05-26 LAB — APTT: aPTT: 32 seconds (ref 24–36)

## 2018-05-26 LAB — GLUCOSE, CAPILLARY
GLUCOSE-CAPILLARY: 59 mg/dL — AB (ref 70–99)
GLUCOSE-CAPILLARY: 188 mg/dL — AB (ref 70–99)

## 2018-05-26 LAB — SURGICAL PCR SCREEN
MRSA, PCR: NEGATIVE
Staphylococcus aureus: NEGATIVE

## 2018-05-26 NOTE — Progress Notes (Addendum)
PCP - Dr. Osborne Casco Cardiologist - Dr Jacinta Shoe  EKG - 04/13/18 Stress Test - 04/25/18 ECHO - 10/26/14  Sleep Study - 01/16/15 CPAP - pt does not use CPAP  Fasting Blood Sugar - typcally 90-140 per patient. Today at home CBG was 99 per patient, he took his insulin, but did not eat breakfast. CBG 59 at pre-op appointment. Pt given ginger ale and crackers. Follow up CBG 188. Checking A1c today  Blood Thinner Instructions: Pt last dose of plavix 05/24/18. Pt to continue taking Aspirin  Anesthesia review: Stress test  Patient denies shortness of breath, fever, cough and chest pain at PAT appointment   Patient verbalized understanding of instructions that were given to them at the PAT appointment. Patient was also instructed that they will need to review over the PAT instructions again at home before surgery.

## 2018-05-29 MED ORDER — CEFAZOLIN SODIUM 10 G IJ SOLR
3.0000 g | INTRAMUSCULAR | Status: AC
  Administered 2018-05-30 (×2): 3 g via INTRAVENOUS
  Filled 2018-05-29: qty 3

## 2018-05-29 NOTE — Anesthesia Preprocedure Evaluation (Addendum)
Anesthesia Evaluation  Patient identified by MRN, date of birth, ID band Patient awake    Reviewed: Allergy & Precautions, NPO status , Patient's Chart, lab work & pertinent test results  Airway Mallampati: II  TM Distance: >3 FB Neck ROM: Full    Dental no notable dental hx. (+) Poor Dentition, Missing, Chipped   Pulmonary sleep apnea , former smoker,    Pulmonary exam normal breath sounds clear to auscultation       Cardiovascular hypertension, Pt. on medications + Peripheral Vascular Disease  Normal cardiovascular exam Rhythm:Regular Rate:Normal  CV: Nuclear stress test 04/25/18:  There was no ST segment deviation noted during stress.  Findings consistent with prior myocardial inferoseptal infarction with mild peri-infarct ischemia.  This is a low risk study.  The left ventricular ejection fraction is normal (55-65%). , "Low risk study overall with small area of prior heart attack and mild blockage to be treated with medications. Can proceed with revascularization as planned.")  Echo 10/26/14: Study Conclusions - Left ventricle: The cavity size was normal. There was moderate concentric hypertrophy. Systolic function was normal. The estimated ejection fraction was in the range of 55% to 60%. Wall motion was normal; there were no regional wall motion abnormalities. Doppler parameters are consistent with abnormal left ventricular relaxation (grade 1 diastolic dysfunction). - Left atrium: The atrium was mildly to moderately dilated. - Pulmonary arteries: Systolic pressure was mildly increased. PA peak pressure: 35 mm Hg (S).        Neuro/Psych  Carotid U/S 10/25/14: Summary: Bilateral: 1-39% ICA stenosis. Vertebral artery flow is antegrade. Moderate ECA stenosis.    CVA negative psych ROS   GI/Hepatic   Endo/Other  diabetes, Type 2  Renal/GU   negative genitourinary    Musculoskeletal  (+) Arthritis , Osteoarthritis,    Abdominal   Peds negative pediatric ROS (+)  Hematology negative hematology ROS (+)   Anesthesia Other Findings   Reproductive/Obstetrics                           Anesthesia Physical  Anesthesia Plan  ASA: III  Anesthesia Plan: General   Post-op Pain Management:    Induction: Intravenous  PONV Risk Score and Plan: 2 and Ondansetron and Treatment may vary due to age or medical condition  Airway Management Planned: LMA and Oral ETT  Additional Equipment: Arterial line  Intra-op Plan:   Post-operative Plan: Extubation in OR  Informed Consent: I have reviewed the patients History and Physical, chart, labs and discussed the procedure including the risks, benefits and alternatives for the proposed anesthesia with the patient or authorized representative who has indicated his/her understanding and acceptance.   Dental advisory given  Plan Discussed with: CRNA, Surgeon and Anesthesiologist  Anesthesia Plan Comments: ( )      Anesthesia Quick Evaluation

## 2018-05-29 NOTE — Progress Notes (Signed)
Anesthesia Chart Review:  Case:  [id number] Date/Time:  05/30/18 0700   Procedures:      BYPASS GRAFT LEFT AXILLA-BIFEMORAL (Left )     ENDARTERECTOMY FEMORAL BILATERAL (Bilateral )   Anesthesia type:  General   Pre-op diagnosis:  CRITICL LOWER LIMB ISCHEMIA   Location:  [facility] ROOM 11 / [facility]   Surgeon:  Conrad [place], MD    According to Dr. Lianne Moris notes, Deitra Mayo, MD will also be assisting (co-surgeon).   DISCUSSION: Patient is a 72 year old male scheduled for the above procedure.   History includes former smoker, HTN, PVD(chronic aortic dissection/chronic bilateral EIA occlusion with collaterals), CKD stage II, DM2, right MCA CVA 10/25/14 (s/p tPA, Plavix), hypercholesterolemia, claustrophobia (with MRI), OSA (intolerant to CPAP), morbid obesity (BMI 43.5).  He had a recent stress test that showed evidence of a small prior heart attack with mild peri-infarct ischemia, but felt to be an overall low risk study. Cardiologist Dr. [name] wrote, "Can proceed with revascularization as planned."  Last Plavix dose 05/24/18. He is to continue ASA perioperatively. If no acute changes then I anticipate that he can proceed as planned.    VS: BP (!) 155/62   Pulse (!) 55   Temp 36.6 C   Resp 18   Ht 5\' 7"  (1.702 m)   SpO2 97%   BMI 43.51 kg/m   PROVIDERS: [name], Fransico Him, MD is PCP Kate Sable, MD is cardiologist. He saw patient on 04/19/18 for preoperative cardiology risk stratification.    LABS: Labs reviewed: Acceptable for surgery. Cr 1.08. A1c 7.6.  (all labs ordered are listed, but only abnormal results are displayed)  Labs Reviewed  GLUCOSE, CAPILLARY - Abnormal; Notable for the following components:      Result Value   Glucose-Capillary 59 (*)    All other components within normal limits  BLOOD GAS, ARTERIAL - Abnormal; Notable for the following components:   pO2, Arterial 113 (*)    All other components within normal limits  CBC - Abnormal; Notable for  the following components:   Hemoglobin 12.2 (*)    MCH 24.6 (*)    RDW 17.2 (*)    All other components within normal limits  COMPREHENSIVE METABOLIC PANEL - Abnormal; Notable for the following components:   CO2 20 (*)    Glucose, Bld 204 (*)    Calcium 8.6 (*)    Total Protein 6.4 (*)    Albumin 3.3 (*)    All other components within normal limits  URINALYSIS, ROUTINE W REFLEX MICROSCOPIC - Abnormal; Notable for the following components:   APPearance CLOUDY (*)    Hgb urine dipstick SMALL (*)    Leukocytes, UA TRACE (*)    Bacteria, UA RARE (*)    All other components within normal limits  HEMOGLOBIN A1C - Abnormal; Notable for the following components:   Hgb A1c MFr Bld 7.6 (*)    All other components within normal limits  GLUCOSE, CAPILLARY - Abnormal; Notable for the following components:   Glucose-Capillary 188 (*)    All other components within normal limits  SURGICAL PCR SCREEN  APTT  PROTIME-INR  PREPARE RBC (CROSSMATCH)  TYPE AND SCREEN    IMAGES: CTA chest/abd/pelvis 05/16/18: IMPRESSION: 1. No evidence of aneurysmal disease, dissection or significant atherosclerosis involving the thoracic aorta. 2. Complex chronic appearing partial dissection of the distal aorta extending into bilateral iliac arteries. Associated aneurysmal disease of the distal aorta measures 4 cm in greatest diameter. 3.  In the right lower extremity, dissection in the common iliac artery does not cause visible luminal restriction. 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 Visible reconstitution in the thigh of profunda femoral branches and the proximal SFA. See above for measurements of the common and internal iliac arteries. 4. In the left lower extremity, complex dissection affects the common iliac artery with associated aneurysmal disease of the distal common iliac artery  measuring 2.3 cm. There is significant stenosis at the origin of the left external iliac artery and component of probable dissection in the mid external iliac artery causing 50% stenosis. The common femoral artery shows roughly 60% proximal stenosis. The proximal left SFA appears diffusely stenotic.   EKG: 04/13/18: SB at 51 bpm. Minimal voltage criteria for LVH, may be normal variant. Septal infarct (age undetermined).   CV: Nuclear stress test 04/25/18:  There was no ST segment deviation noted during stress.  Findings consistent with prior myocardial inferoseptal infarction with mild peri-infarct ischemia.  This is a low risk study.  The left ventricular ejection fraction is normal (55-65%). (Results reviewed by Dr. [name] who wrote, "Low risk study overall with small area of prior heart attack and mild blockage to be treated with medications. Can proceed with revascularization as planned.")  Aortogram with BLE runoff 04/13/18: Findings:  Aorta: patent, abnormal flow, suggestion of dissection flap  Celiac artery: patent  Superior mesenteric artery: patent  Transcranial Doppler Emboli Monitoring 02/26/15: Impression: No evidence of spontaneous cerebral emboli during thirty minutes of continuous monitoring of bilateral tICA's.  Event monitor 11/05/14-12/05/14:  Summary:  The patient was monitored for 734:05 hours, of which 672:58 hours were usable. Average heart rate for the monitored period was 58 bpm. Tachycardia was present for < 1% of the readable data.; Bradycardia was present for 56% of the readable data. No pause(s) noted of 3 seconds or longer. Patient transmitted 8 manually triggered recordings and reported symptoms of Passed out, Other. Sinus rhythm with PAC's. Occasional sinus bradycardia. No afib.   Echo 10/26/14: Study Conclusions - Left ventricle: The cavity size was normal. There was moderate concentric hypertrophy. Systolic function was normal.  The estimated ejection fraction was in the range of 55% to 60%. Wall motion was normal; there were no regional wall motion abnormalities. Doppler parameters are consistent with abnormal left ventricular relaxation (grade 1 diastolic dysfunction). - Left atrium: The atrium was mildly to moderately dilated. - Pulmonary arteries: Systolic pressure was mildly increased. PA peak pressure: 35 mm Hg (S). Impressions: - No cardiac source of emboli was indentified.  Carotid U/S 10/25/14: Summary: Bilateral: 1-39% ICA stenosis. Vertebral artery flow is antegrade. Moderate ECA stenosis.   Past Medical History:  Diagnosis Date  . Arthritis   . Chronic kidney disease    Stage 2  . CVA (cerebral vascular accident) ([place]) 10/2014    Late effect of cerebrovascular disease.  Mostly resolved, but can see difference in facial expressions.  tp-A, Plavix afterward  . Diabetes mellitus without complication ([place])    type II, controlled, with renal comps  . Diabetic foot ([place]) 04/05/2018   Normal sensation, skin intact  . Diverticulosis   . ED (erectile dysfunction)   . GI bleed 04/2017  . Gout   . History of claustrophobia    with MRI  . Hx of adenomatous polyp of rectum 06/10/2017  . Hypercholesterolemia   . Hypertension   . Long term (  current) use of insulin ([place])    Remains on basal/bolus therapy without hypoglycemia.  . Microalbuminuria 2013  . Morbid obesity ([place])   . Obesity   . OSA (obstructive sleep apnea)    Intolerant to CPAP  . Peripheral vascular disease ([place])   . Right calf pain 04/05/2018  . Vertigo     Past Surgical History:  Procedure Laterality Date  . ABDOMINAL AORTOGRAM N/A 04/13/2018   Procedure: ABDOMINAL AORTOGRAM;  Surgeon: Conrad Kimble, MD;  Location: [facility];  Service: Cardiovascular;  Laterality: N/A;  . COLONOSCOPY WITH PROPOFOL N/A 06/08/2017   Procedure: COLONOSCOPY WITH PROPOFOL ( Ultra-Slim Scope);  Surgeon: Gatha Mayer, MD;   Location: [facility];  Service: Endoscopy;  Laterality: N/A;  . LOWER EXTREMITY ANGIOGRAPHY N/A 04/13/2018   Procedure: LOWER EXTREMITY ANGIOGRAPHY;  Surgeon: Conrad Levelock, MD;  Location: [facility];  Service: Cardiovascular;  Laterality: N/A;  bilateral   . UMBILICAL HERNIA REPAIR      MEDICATIONS: . acetaminophen (TYLENOL ARTHRITIS PAIN) 650 MG CR tablet  . amLODipine (NORVASC) 10 MG tablet  . aspirin EC 81 MG tablet  . atorvastatin (LIPITOR) 80 MG tablet  . clopidogrel (PLAVIX) 75 MG tablet  . colchicine (COLCRYS) 0.6 MG tablet  . ezetimibe (ZETIA) 10 MG tablet  . ferrous sulfate 325 (65 FE) MG tablet  . insulin lispro (HUMALOG) 100 UNIT/ML KiwkPen  . LANTUS SOLOSTAR 100 UNIT/ML Solostar Pen  . losartan-hydrochlorothiazide (HYZAAR) 100-25 MG per tablet  . OVER THE COUNTER MEDICATION  . Oxymetazoline HCl (VICKS SINEX 12 HOUR NA)   No current facility-administered medications for this encounter.     George Hugh [facility] Short Stay Center/Anesthesiology Phone [phone] 05/29/2018 9:58 AM

## 2018-05-30 ENCOUNTER — Encounter (HOSPITAL_COMMUNITY): Payer: Self-pay | Admitting: *Deleted

## 2018-05-30 ENCOUNTER — Inpatient Hospital Stay (HOSPITAL_COMMUNITY): Payer: Medicare Other | Admitting: Vascular Surgery

## 2018-05-30 ENCOUNTER — Inpatient Hospital Stay (HOSPITAL_COMMUNITY): Payer: Medicare Other

## 2018-05-30 ENCOUNTER — Encounter (HOSPITAL_COMMUNITY)
Admission: RE | Disposition: A | Discharge status: Home Health | Payer: Self-pay | Source: Ambulatory Visit | Attending: Vascular Surgery

## 2018-05-30 ENCOUNTER — Inpatient Hospital Stay (HOSPITAL_COMMUNITY)
Admission: RE | Admit: 2018-05-30 | Discharge: 2018-06-02 | DRG: 253 | Disposition: A | Discharge status: Home Health | Payer: Medicare Other | Source: Ambulatory Visit | Attending: Vascular Surgery | Admitting: Vascular Surgery

## 2018-05-30 ENCOUNTER — Inpatient Hospital Stay (HOSPITAL_COMMUNITY): Payer: Medicare Other | Admitting: Certified Registered Nurse Anesthetist

## 2018-05-30 ENCOUNTER — Telehealth: Payer: Self-pay | Admitting: Vascular Surgery

## 2018-05-30 DIAGNOSIS — Z7902 Long term (current) use of antithrombotics/antiplatelets: Secondary | ICD-10-CM | POA: Diagnosis not present

## 2018-05-30 DIAGNOSIS — E1122 Type 2 diabetes mellitus with diabetic chronic kidney disease: Secondary | ICD-10-CM | POA: Diagnosis present

## 2018-05-30 DIAGNOSIS — I129 Hypertensive chronic kidney disease with stage 1 through stage 4 chronic kidney disease, or unspecified chronic kidney disease: Secondary | ICD-10-CM | POA: Diagnosis present

## 2018-05-30 DIAGNOSIS — J984 Other disorders of lung: Secondary | ICD-10-CM | POA: Diagnosis not present

## 2018-05-30 DIAGNOSIS — Z823 Family history of stroke: Secondary | ICD-10-CM | POA: Diagnosis not present

## 2018-05-30 DIAGNOSIS — L97519 Non-pressure chronic ulcer of other part of right foot with unspecified severity: Secondary | ICD-10-CM | POA: Diagnosis not present

## 2018-05-30 DIAGNOSIS — I69392 Facial weakness following cerebral infarction: Secondary | ICD-10-CM | POA: Diagnosis not present

## 2018-05-30 DIAGNOSIS — Z6841 Body Mass Index (BMI) 40.0 and over, adult: Secondary | ICD-10-CM

## 2018-05-30 DIAGNOSIS — Z9889 Other specified postprocedural states: Secondary | ICD-10-CM | POA: Diagnosis not present

## 2018-05-30 DIAGNOSIS — G4733 Obstructive sleep apnea (adult) (pediatric): Secondary | ICD-10-CM | POA: Diagnosis present

## 2018-05-30 DIAGNOSIS — E785 Hyperlipidemia, unspecified: Secondary | ICD-10-CM | POA: Diagnosis not present

## 2018-05-30 DIAGNOSIS — M1990 Unspecified osteoarthritis, unspecified site: Secondary | ICD-10-CM | POA: Diagnosis present

## 2018-05-30 DIAGNOSIS — E7800 Pure hypercholesterolemia, unspecified: Secondary | ICD-10-CM | POA: Diagnosis not present

## 2018-05-30 DIAGNOSIS — I70229 Atherosclerosis of native arteries of extremities with rest pain, unspecified extremity: Secondary | ICD-10-CM | POA: Diagnosis present

## 2018-05-30 DIAGNOSIS — N182 Chronic kidney disease, stage 2 (mild): Secondary | ICD-10-CM | POA: Diagnosis present

## 2018-05-30 DIAGNOSIS — Z7982 Long term (current) use of aspirin: Secondary | ICD-10-CM | POA: Diagnosis not present

## 2018-05-30 DIAGNOSIS — I739 Peripheral vascular disease, unspecified: Secondary | ICD-10-CM | POA: Diagnosis not present

## 2018-05-30 DIAGNOSIS — Z800 Family history of malignant neoplasm of digestive organs: Secondary | ICD-10-CM | POA: Diagnosis not present

## 2018-05-30 DIAGNOSIS — E669 Obesity, unspecified: Secondary | ICD-10-CM | POA: Diagnosis present

## 2018-05-30 DIAGNOSIS — F17210 Nicotine dependence, cigarettes, uncomplicated: Secondary | ICD-10-CM | POA: Diagnosis present

## 2018-05-30 DIAGNOSIS — Z820 Family history of epilepsy and other diseases of the nervous system: Secondary | ICD-10-CM

## 2018-05-30 DIAGNOSIS — E11621 Type 2 diabetes mellitus with foot ulcer: Secondary | ICD-10-CM | POA: Diagnosis present

## 2018-05-30 DIAGNOSIS — M109 Gout, unspecified: Secondary | ICD-10-CM | POA: Diagnosis not present

## 2018-05-30 DIAGNOSIS — I70223 Atherosclerosis of native arteries of extremities with rest pain, bilateral legs: Secondary | ICD-10-CM | POA: Diagnosis not present

## 2018-05-30 DIAGNOSIS — I998 Other disorder of circulatory system: Secondary | ICD-10-CM | POA: Diagnosis not present

## 2018-05-30 DIAGNOSIS — E1151 Type 2 diabetes mellitus with diabetic peripheral angiopathy without gangrene: Secondary | ICD-10-CM | POA: Diagnosis present

## 2018-05-30 DIAGNOSIS — Z8249 Family history of ischemic heart disease and other diseases of the circulatory system: Secondary | ICD-10-CM | POA: Diagnosis not present

## 2018-05-30 DIAGNOSIS — Z833 Family history of diabetes mellitus: Secondary | ICD-10-CM | POA: Diagnosis not present

## 2018-05-30 DIAGNOSIS — I7772 Dissection of iliac artery: Secondary | ICD-10-CM | POA: Diagnosis not present

## 2018-05-30 DIAGNOSIS — Z794 Long term (current) use of insulin: Secondary | ICD-10-CM

## 2018-05-30 DIAGNOSIS — Z419 Encounter for procedure for purposes other than remedying health state, unspecified: Secondary | ICD-10-CM

## 2018-05-30 DIAGNOSIS — K5790 Diverticulosis of intestine, part unspecified, without perforation or abscess without bleeding: Secondary | ICD-10-CM | POA: Diagnosis present

## 2018-05-30 DIAGNOSIS — Z8719 Personal history of other diseases of the digestive system: Secondary | ICD-10-CM | POA: Diagnosis not present

## 2018-05-30 DIAGNOSIS — M79671 Pain in right foot: Secondary | ICD-10-CM | POA: Diagnosis not present

## 2018-05-30 DIAGNOSIS — I10 Essential (primary) hypertension: Secondary | ICD-10-CM | POA: Diagnosis not present

## 2018-05-30 HISTORY — PX: AXILLARY-FEMORAL BYPASS GRAFT: SHX894

## 2018-05-30 HISTORY — PX: ENDARTERECTOMY FEMORAL: SHX5804

## 2018-05-30 HISTORY — PX: INTRAOPERATIVE ARTERIOGRAM: SHX5157

## 2018-05-30 HISTORY — PX: APPLICATION OF WOUND VAC: SHX5189

## 2018-05-30 LAB — CBC
HCT: 36.7 % — ABNORMAL LOW (ref 39.0–52.0)
HEMOGLOBIN: 11.3 g/dL — AB (ref 13.0–17.0)
MCH: 24.7 pg — AB (ref 26.0–34.0)
MCHC: 30.8 g/dL (ref 30.0–36.0)
MCV: 80.1 fL (ref 78.0–100.0)
Platelets: 275 10*3/uL (ref 150–400)
RBC: 4.58 MIL/uL (ref 4.22–5.81)
RDW: 17.2 % — AB (ref 11.5–15.5)
WBC: 14.4 10*3/uL — ABNORMAL HIGH (ref 4.0–10.5)

## 2018-05-30 LAB — CREATININE, SERUM
Creatinine, Ser: 1.05 mg/dL (ref 0.61–1.24)
GFR calc Af Amer: >60 mL/min (ref >60)
GFR calc non Af Amer: >60 mL/min (ref >60)

## 2018-05-30 LAB — GLUCOSE, CAPILLARY
GLUCOSE-CAPILLARY: 201 mg/dL — AB (ref 70–99)
GLUCOSE-CAPILLARY: 197 mg/dL — AB (ref 70–99)
Glucose-Capillary: 174 mg/dL — ABNORMAL HIGH (ref 70–99)
Glucose-Capillary: 196 mg/dL — ABNORMAL HIGH (ref 70–99)

## 2018-05-30 SURGERY — CREATION, BYPASS, ARTERIAL, AXILLARY TO BILATERAL FEMORAL, USING GRAFT
Anesthesia: General | Site: Leg Lower | Laterality: Left

## 2018-05-30 MED ORDER — FERROUS SULFATE 325 (65 FE) MG PO TABS
325.0000 mg | ORAL_TABLET | Freq: Every day | ORAL | Status: DC
  Administered 2018-05-31 – 2018-06-02 (×3): 325 mg via ORAL
  Filled 2018-05-30 (×4): qty 1

## 2018-05-30 MED ORDER — HYDRALAZINE HCL 20 MG/ML IJ SOLN: 5.0000 mg | INTRAMUSCULAR | Status: DC | PRN

## 2018-05-30 MED ORDER — LIDOCAINE 2% (20 MG/ML) 5 ML SYRINGE
INTRAMUSCULAR | Status: DC | PRN
  Administered 2018-05-30: 100 mg via INTRAVENOUS

## 2018-05-30 MED ORDER — GLYCOPYRROLATE 0.2 MG/ML IJ SOLN
INTRAMUSCULAR | Status: DC | PRN
  Administered 2018-05-30: .2 mg via INTRAVENOUS

## 2018-05-30 MED ORDER — HEMOSTATIC AGENTS (NO CHARGE) OPTIME
TOPICAL | Status: DC | PRN
  Administered 2018-05-30: 1 via TOPICAL

## 2018-05-30 MED ORDER — 0.9 % SODIUM CHLORIDE (POUR BTL) OPTIME
TOPICAL | Status: DC | PRN
  Administered 2018-05-30: 2000 mL

## 2018-05-30 MED ORDER — PROTAMINE SULFATE 10 MG/ML IV SOLN
INTRAVENOUS | Status: DC | PRN
  Administered 2018-05-30: 50 mg via INTRAVENOUS
  Administered 2018-05-30: 20 mg via INTRAVENOUS

## 2018-05-30 MED ORDER — IODIXANOL 320 MG/ML IV SOLN
INTRAVENOUS | Status: DC | PRN
  Administered 2018-05-30: 50 mL via INTRAVENOUS

## 2018-05-30 MED ORDER — AMLODIPINE BESYLATE 10 MG PO TABS
10.0000 mg | ORAL_TABLET | Freq: Every evening | ORAL | Status: DC
  Administered 2018-05-30 – 2018-06-01 (×3): 10 mg via ORAL
  Filled 2018-05-30 (×3): qty 1

## 2018-05-30 MED ORDER — DOCUSATE SODIUM 100 MG PO CAPS
100.0000 mg | ORAL_CAPSULE | Freq: Every day | ORAL | Status: DC
  Administered 2018-05-31 – 2018-06-02 (×3): 100 mg via ORAL
  Filled 2018-05-30 (×4): qty 1

## 2018-05-30 MED ORDER — ONDANSETRON HCL 4 MG/2ML IJ SOLN: 4.0000 mg | Freq: Four times a day (QID) | INTRAMUSCULAR | Status: DC | PRN

## 2018-05-30 MED ORDER — GLYCOPYRROLATE PF 0.2 MG/ML IJ SOSY
PREFILLED_SYRINGE | INTRAMUSCULAR | Status: AC
  Filled 2018-05-30: qty 1

## 2018-05-30 MED ORDER — ROCURONIUM BROMIDE 10 MG/ML (PF) SYRINGE
PREFILLED_SYRINGE | INTRAVENOUS | Status: DC | PRN
  Administered 2018-05-30: 40 mg via INTRAVENOUS
  Administered 2018-05-30: 10 mg via INTRAVENOUS
  Administered 2018-05-30: 20 mg via INTRAVENOUS
  Administered 2018-05-30: 60 mg via INTRAVENOUS
  Administered 2018-05-30 (×2): 20 mg via INTRAVENOUS

## 2018-05-30 MED ORDER — ATORVASTATIN CALCIUM 80 MG PO TABS
80.0000 mg | ORAL_TABLET | Freq: Every day | ORAL | Status: DC
  Administered 2018-05-30 – 2018-06-02 (×4): 80 mg via ORAL
  Filled 2018-05-30 (×4): qty 1

## 2018-05-30 MED ORDER — HEPARIN SODIUM (PORCINE) 5000 UNIT/ML IJ SOLN
INTRAMUSCULAR | Status: AC
  Filled 2018-05-30: qty 1.2

## 2018-05-30 MED ORDER — FENTANYL CITRATE (PF) 250 MCG/5ML IJ SOLN
INTRAMUSCULAR | Status: DC | PRN
  Administered 2018-05-30: 25 ug via INTRAVENOUS
  Administered 2018-05-30: 50 ug via INTRAVENOUS
  Administered 2018-05-30: 25 ug via INTRAVENOUS
  Administered 2018-05-30: 50 ug via INTRAVENOUS
  Administered 2018-05-30: 100 ug via INTRAVENOUS
  Administered 2018-05-30 (×2): 50 ug via INTRAVENOUS

## 2018-05-30 MED ORDER — FENTANYL CITRATE (PF) 250 MCG/5ML IJ SOLN
INTRAMUSCULAR | Status: AC
  Filled 2018-05-30: qty 5

## 2018-05-30 MED ORDER — PHENOL 1.4 % MT LIQD: 1.0000 | OROMUCOSAL | Status: DC | PRN

## 2018-05-30 MED ORDER — FENTANYL CITRATE (PF) 100 MCG/2ML IJ SOLN
INTRAMUSCULAR | Status: AC
  Filled 2018-05-30: qty 2

## 2018-05-30 MED ORDER — OXYMETAZOLINE HCL 0.05 % NA SOLN
NASAL | Status: DC | PRN
  Administered 2018-05-30: 2 via NASAL

## 2018-05-30 MED ORDER — MEPERIDINE HCL 50 MG/ML IJ SOLN: 6.2500 mg | INTRAMUSCULAR | Status: DC | PRN

## 2018-05-30 MED ORDER — CEFAZOLIN SODIUM 1 G IJ SOLR
INTRAMUSCULAR | Status: AC
  Filled 2018-05-30: qty 30

## 2018-05-30 MED ORDER — LOSARTAN POTASSIUM 50 MG PO TABS
100.0000 mg | ORAL_TABLET | Freq: Every day | ORAL | Status: DC
  Administered 2018-05-30 – 2018-06-02 (×4): 100 mg via ORAL
  Filled 2018-05-30 (×4): qty 2

## 2018-05-30 MED ORDER — ASPIRIN EC 81 MG PO TBEC
81.0000 mg | DELAYED_RELEASE_TABLET | Freq: Every day | ORAL | Status: DC
  Administered 2018-05-30 – 2018-06-02 (×4): 81 mg via ORAL
  Filled 2018-05-30 (×4): qty 1

## 2018-05-30 MED ORDER — HEPARIN SODIUM (PORCINE) 1000 UNIT/ML IJ SOLN
INTRAMUSCULAR | Status: DC | PRN
  Administered 2018-05-30: 13000 [IU] via INTRAVENOUS

## 2018-05-30 MED ORDER — EZETIMIBE 10 MG PO TABS
10.0000 mg | ORAL_TABLET | Freq: Every day | ORAL | Status: DC
  Administered 2018-05-30 – 2018-06-02 (×4): 10 mg via ORAL
  Filled 2018-05-30 (×4): qty 1

## 2018-05-30 MED ORDER — DEXAMETHASONE SODIUM PHOSPHATE 10 MG/ML IJ SOLN
INTRAMUSCULAR | Status: AC
  Filled 2018-05-30: qty 1

## 2018-05-30 MED ORDER — ROCURONIUM BROMIDE 10 MG/ML (PF) SYRINGE
PREFILLED_SYRINGE | INTRAVENOUS | Status: AC
  Filled 2018-05-30: qty 10

## 2018-05-30 MED ORDER — POTASSIUM CHLORIDE CRYS ER 20 MEQ PO TBCR: 20.0000 meq | EXTENDED_RELEASE_TABLET | Freq: Every day | ORAL | Status: DC | PRN

## 2018-05-30 MED ORDER — SUGAMMADEX SODIUM 500 MG/5ML IV SOLN
INTRAVENOUS | Status: AC
  Filled 2018-05-30: qty 5

## 2018-05-30 MED ORDER — MAGNESIUM SULFATE 2 GM/50ML IV SOLN: 2.0000 g | Freq: Every day | INTRAVENOUS | Status: DC | PRN

## 2018-05-30 MED ORDER — CHLORHEXIDINE GLUCONATE 4 % EX LIQD: 60.0000 mL | Freq: Once | CUTANEOUS | Status: DC

## 2018-05-30 MED ORDER — SUGAMMADEX SODIUM 200 MG/2ML IV SOLN
INTRAVENOUS | Status: AC
  Filled 2018-05-30: qty 2

## 2018-05-30 MED ORDER — LABETALOL HCL 5 MG/ML IV SOLN: 10.0000 mg | INTRAVENOUS | Status: DC | PRN

## 2018-05-30 MED ORDER — ONDANSETRON HCL 4 MG/2ML IJ SOLN
INTRAMUSCULAR | Status: DC | PRN
  Administered 2018-05-30: 4 mg via INTRAVENOUS

## 2018-05-30 MED ORDER — CLOPIDOGREL BISULFATE 75 MG PO TABS
75.0000 mg | ORAL_TABLET | Freq: Every day | ORAL | Status: DC
  Administered 2018-05-31 – 2018-06-02 (×3): 75 mg via ORAL
  Filled 2018-05-30 (×4): qty 1

## 2018-05-30 MED ORDER — LACTATED RINGERS IV SOLN
INTRAVENOUS | Status: DC | PRN
  Administered 2018-05-30: 07:00:00 via INTRAVENOUS

## 2018-05-30 MED ORDER — SODIUM CHLORIDE 0.9 % IJ SOLN
INTRAMUSCULAR | Status: AC
  Filled 2018-05-30: qty 20

## 2018-05-30 MED ORDER — PROPOFOL 10 MG/ML IV BOLUS
INTRAVENOUS | Status: AC
  Filled 2018-05-30: qty 20

## 2018-05-30 MED ORDER — LACTATED RINGERS IV SOLN
INTRAVENOUS | Status: DC | PRN
  Administered 2018-05-30 (×2): via INTRAVENOUS

## 2018-05-30 MED ORDER — HEPARIN SODIUM (PORCINE) 5000 UNIT/ML IJ SOLN
5000.0000 [IU] | Freq: Three times a day (TID) | INTRAMUSCULAR | Status: DC
  Administered 2018-05-30 – 2018-06-02 (×8): 5000 [IU] via SUBCUTANEOUS
  Filled 2018-05-30 (×9): qty 1

## 2018-05-30 MED ORDER — HYDROCHLOROTHIAZIDE 25 MG PO TABS
25.0000 mg | ORAL_TABLET | Freq: Every day | ORAL | Status: DC
  Administered 2018-05-30 – 2018-06-02 (×4): 25 mg via ORAL
  Filled 2018-05-30 (×4): qty 1

## 2018-05-30 MED ORDER — SODIUM CHLORIDE 0.9 % IV SOLN: INTRAVENOUS | Status: DC

## 2018-05-30 MED ORDER — ACETAMINOPHEN 325 MG PO TABS: 325.0000 mg | ORAL_TABLET | ORAL | Status: DC | PRN

## 2018-05-30 MED ORDER — ONDANSETRON HCL 4 MG/2ML IJ SOLN
INTRAMUSCULAR | Status: AC
  Filled 2018-05-30: qty 2

## 2018-05-30 MED ORDER — INSULIN ASPART 100 UNIT/ML ~~LOC~~ SOLN
0.0000 [IU] | Freq: Three times a day (TID) | SUBCUTANEOUS | Status: DC
  Administered 2018-05-30: 3 [IU] via SUBCUTANEOUS
  Administered 2018-05-31: 8 [IU] via SUBCUTANEOUS
  Administered 2018-05-31: 3 [IU] via SUBCUTANEOUS
  Administered 2018-05-31: 5 [IU] via SUBCUTANEOUS
  Administered 2018-06-01: 3 [IU] via SUBCUTANEOUS
  Administered 2018-06-01 (×2): 5 [IU] via SUBCUTANEOUS
  Administered 2018-06-02: 3 [IU] via SUBCUTANEOUS

## 2018-05-30 MED ORDER — ACETAMINOPHEN 325 MG RE SUPP: 325.0000 mg | RECTAL | Status: DC | PRN

## 2018-05-30 MED ORDER — ALUM & MAG HYDROXIDE-SIMETH 200-200-20 MG/5ML PO SUSP: 15.0000 mL | ORAL | Status: DC | PRN

## 2018-05-30 MED ORDER — LOSARTAN POTASSIUM-HCTZ 100-25 MG PO TABS: 1.0000 | ORAL_TABLET | Freq: Every day | ORAL | Status: DC

## 2018-05-30 MED ORDER — MORPHINE SULFATE (PF) 4 MG/ML IV SOLN: 4.0000 mg | INTRAVENOUS | Status: DC | PRN

## 2018-05-30 MED ORDER — PROPOFOL 10 MG/ML IV BOLUS
INTRAVENOUS | Status: DC | PRN
  Administered 2018-05-30: 200 mg via INTRAVENOUS

## 2018-05-30 MED ORDER — CEFAZOLIN SODIUM-DEXTROSE 2-4 GM/100ML-% IV SOLN
2.0000 g | Freq: Three times a day (TID) | INTRAVENOUS | Status: AC
Start: 2018-05-30 — End: 2018-05-31
  Administered 2018-05-30 – 2018-05-31 (×2): 2 g via INTRAVENOUS
  Filled 2018-05-30 (×2): qty 100

## 2018-05-30 MED ORDER — GUAIFENESIN-DM 100-10 MG/5ML PO SYRP: 15.0000 mL | ORAL_SOLUTION | ORAL | Status: DC | PRN

## 2018-05-30 MED ORDER — LIDOCAINE 2% (20 MG/ML) 5 ML SYRINGE
INTRAMUSCULAR | Status: AC
  Filled 2018-05-30: qty 5

## 2018-05-30 MED ORDER — PANTOPRAZOLE SODIUM 40 MG PO TBEC
40.0000 mg | DELAYED_RELEASE_TABLET | Freq: Every day | ORAL | Status: DC
  Administered 2018-05-30 – 2018-06-02 (×4): 40 mg via ORAL
  Filled 2018-05-30 (×4): qty 1

## 2018-05-30 MED ORDER — METOPROLOL TARTRATE 5 MG/5ML IV SOLN: 2.0000 mg | INTRAVENOUS | Status: DC | PRN

## 2018-05-30 MED ORDER — SUGAMMADEX SODIUM 200 MG/2ML IV SOLN
INTRAVENOUS | Status: DC | PRN
  Administered 2018-05-30: 300 mg via INTRAVENOUS

## 2018-05-30 MED ORDER — OXYCODONE-ACETAMINOPHEN 5-325 MG PO TABS
1.0000 | ORAL_TABLET | ORAL | Status: DC | PRN
  Administered 2018-05-30 – 2018-06-02 (×9): 2 via ORAL
  Filled 2018-05-30 (×9): qty 2

## 2018-05-30 MED ORDER — MIDAZOLAM HCL 2 MG/2ML IJ SOLN
INTRAMUSCULAR | Status: AC
  Filled 2018-05-30: qty 2

## 2018-05-30 MED ORDER — SODIUM CHLORIDE 0.9 % IV SOLN: 500.0000 mL | Freq: Once | INTRAVENOUS | Status: DC | PRN

## 2018-05-30 MED ORDER — SODIUM CHLORIDE 0.9 % IV SOLN
INTRAVENOUS | Status: DC
  Administered 2018-05-30 – 2018-05-31 (×2): via INTRAVENOUS

## 2018-05-30 MED ORDER — SODIUM CHLORIDE 0.9 % IV SOLN
INTRAVENOUS | Status: DC | PRN
  Administered 2018-05-30: 500 mL

## 2018-05-30 MED ORDER — MIDAZOLAM HCL 2 MG/2ML IJ SOLN
INTRAMUSCULAR | Status: DC | PRN
  Administered 2018-05-30: 2 mg via INTRAVENOUS

## 2018-05-30 MED ORDER — PHENYLEPHRINE HCL 10 MG/ML IJ SOLN
INTRAMUSCULAR | Status: DC | PRN
  Administered 2018-05-30: 25 ug/min via INTRAVENOUS

## 2018-05-30 MED ORDER — PHENYLEPHRINE 40 MCG/ML (10ML) SYRINGE FOR IV PUSH (FOR BLOOD PRESSURE SUPPORT)
PREFILLED_SYRINGE | INTRAVENOUS | Status: DC | PRN
  Administered 2018-05-30: 40 ug via INTRAVENOUS
  Administered 2018-05-30 (×2): 120 ug via INTRAVENOUS

## 2018-05-30 MED ORDER — FENTANYL CITRATE (PF) 100 MCG/2ML IJ SOLN
25.0000 ug | INTRAMUSCULAR | Status: DC | PRN
  Administered 2018-05-30 (×2): 25 ug via INTRAVENOUS

## 2018-05-30 MED ORDER — EPHEDRINE SULFATE 50 MG/ML IJ SOLN
INTRAMUSCULAR | Status: DC | PRN
  Administered 2018-05-30 (×2): 5 mg via INTRAVENOUS
  Administered 2018-05-30: 10 mg via INTRAVENOUS
  Administered 2018-05-30: 5 mg via INTRAVENOUS
  Administered 2018-05-30: 10 mg via INTRAVENOUS
  Administered 2018-05-30 (×2): 5 mg via INTRAVENOUS
  Administered 2018-05-30: 10 mg via INTRAVENOUS

## 2018-05-30 SURGICAL SUPPLY — 76 items
BLADE CLIPPER SENSICLIP SURGIC (BLADE) ×6 IMPLANT
BLADE HEX COATED 2.75 (ELECTRODE) ×6 IMPLANT
CANISTER SUCT 3000ML PPV (MISCELLANEOUS) ×12 IMPLANT
CANISTER WOUNDNEG PRESSURE 500 (CANNISTER) ×6 IMPLANT
CLIP VESOCCLUDE MED 24/CT (CLIP) ×12 IMPLANT
CLIP VESOCCLUDE SM WIDE 24/CT (CLIP) ×12 IMPLANT
CLOSURE WOUND 1/2 X4 (GAUZE/BANDAGES/DRESSINGS)
CONNECTOR Y ATS VAC SYSTEM (MISCELLANEOUS) ×6 IMPLANT
DERMABOND ADVANCED (GAUZE/BANDAGES/DRESSINGS) ×2
DERMABOND ADVANCED .7 DNX12 (GAUZE/BANDAGES/DRESSINGS) ×4 IMPLANT
DRAIN CHANNEL 15F RND FF W/TCR (WOUND CARE) IMPLANT
DRAIN SNY 10X20 3/4 PERF (WOUND CARE) IMPLANT
DRAPE INCISE IOBAN 66X45 STRL (DRAPES) ×12 IMPLANT
DRESSING PEEL AND PLC PRVNA 13 (GAUZE/BANDAGES/DRESSINGS) ×8 IMPLANT
DRSG PEEL AND PLACE PREVENA 13 (GAUZE/BANDAGES/DRESSINGS) ×12
ELECT BLADE 4.0 EZ CLEAN MEGAD (MISCELLANEOUS) ×6
ELECT REM PT RETURN 9FT ADLT (ELECTROSURGICAL) ×12
ELECTRODE BLDE 4.0 EZ CLN MEGD (MISCELLANEOUS) ×4 IMPLANT
ELECTRODE REM PT RTRN 9FT ADLT (ELECTROSURGICAL) ×8 IMPLANT
EVACUATOR SILICONE 100CC (DRAIN) IMPLANT
GAUZE SPONGE 4X4 12PLY STRL (GAUZE/BANDAGES/DRESSINGS) IMPLANT
GAUZE SPONGE 4X4 16PLY XRAY LF (GAUZE/BANDAGES/DRESSINGS) ×6 IMPLANT
GLOVE BIO SURGEON STRL SZ 6.5 (GLOVE) ×25 IMPLANT
GLOVE BIO SURGEON STRL SZ7 (GLOVE) ×6 IMPLANT
GLOVE BIO SURGEON STRL SZ7.5 (GLOVE) ×6 IMPLANT
GLOVE BIO SURGEONS STRL SZ 6.5 (GLOVE) ×5
GLOVE BIOGEL PI IND STRL 6.5 (GLOVE) ×12 IMPLANT
GLOVE BIOGEL PI IND STRL 7.5 (GLOVE) ×4 IMPLANT
GLOVE BIOGEL PI IND STRL 8 (GLOVE) ×4 IMPLANT
GLOVE BIOGEL PI INDICATOR 6.5 (GLOVE) ×6
GLOVE BIOGEL PI INDICATOR 7.5 (GLOVE) ×2
GLOVE BIOGEL PI INDICATOR 8 (GLOVE) ×2
GOWN STRL REUS W/ TWL LRG LVL3 (GOWN DISPOSABLE) ×16 IMPLANT
GOWN STRL REUS W/ TWL XL LVL3 (GOWN DISPOSABLE) ×16 IMPLANT
GOWN STRL REUS W/TWL LRG LVL3 (GOWN DISPOSABLE) ×24
GOWN STRL REUS W/TWL XL LVL3 (GOWN DISPOSABLE) ×24
GRAFT CV 60X8STRG TUBE KNTD (Vascular Products) ×4 IMPLANT
GRAFT HEMASHIELD 8MM (Vascular Products) ×8 IMPLANT
GRAFT VASC STRG 30X8KNIT (Vascular Products) ×4 IMPLANT
HEMOSTAT SPONGE AVITENE ULTRA (HEMOSTASIS) ×6 IMPLANT
INSERT FOGARTY SM (MISCELLANEOUS) ×6 IMPLANT
KIT BASIN OR (CUSTOM PROCEDURE TRAY) ×6 IMPLANT
KIT DRSG PREVENA PLUS 7DAY 125 (MISCELLANEOUS) ×6 IMPLANT
KIT TURNOVER KIT B (KITS) ×6 IMPLANT
NS IRRIG 1000ML POUR BTL (IV SOLUTION) ×12 IMPLANT
PACK PERIPHERAL VASCULAR (CUSTOM PROCEDURE TRAY) ×6 IMPLANT
PAD ARMBOARD 7.5X6 YLW CONV (MISCELLANEOUS) ×12 IMPLANT
PENCIL BUTTON HOLSTER BLD 10FT (ELECTRODE) ×12 IMPLANT
SET MICROPUNCTURE 5F STIFF (MISCELLANEOUS) ×12 IMPLANT
SPONGE INTESTINAL PEANUT (DISPOSABLE) ×6 IMPLANT
SPONGE LAP 18X18 X RAY DECT (DISPOSABLE) ×24 IMPLANT
STAPLER VISISTAT 35W (STAPLE) IMPLANT
STOPCOCK 4 WAY LG BORE MALE ST (IV SETS) ×6 IMPLANT
STRIP CLOSURE SKIN 1/2X4 (GAUZE/BANDAGES/DRESSINGS) IMPLANT
SUT MNCRL AB 4-0 PS2 18 (SUTURE) ×24 IMPLANT
SUT PROLENE 5 0 C 1 24 (SUTURE) ×48 IMPLANT
SUT PROLENE 5 0 C 1 36 (SUTURE) ×6 IMPLANT
SUT PROLENE 6 0 BV (SUTURE) ×24 IMPLANT
SUT SILK 2 0 PERMA HAND 18 BK (SUTURE) IMPLANT
SUT SILK 3 0 (SUTURE) ×4
SUT SILK 3-0 18XBRD TIE 12 (SUTURE) ×4 IMPLANT
SUT VIC AB 2-0 CT1 27 (SUTURE) ×18
SUT VIC AB 2-0 CT1 TAPERPNT 27 (SUTURE) ×24 IMPLANT
SUT VIC AB 3-0 SH 27 (SUTURE) ×15
SUT VIC AB 3-0 SH 27X BRD (SUTURE) ×12 IMPLANT
SYR 30ML LL (SYRINGE) ×6 IMPLANT
TAPE STRIPS DRAPE STRL (GAUZE/BANDAGES/DRESSINGS) ×6 IMPLANT
TOWEL GREEN STERILE (TOWEL DISPOSABLE) ×6 IMPLANT
TRAY FOLEY MTR SLVR 16FR STAT (SET/KITS/TRAYS/PACK) ×6 IMPLANT
TUBE CONNECTING 12'X1/4 (SUCTIONS) ×1
TUBE CONNECTING 12X1/4 (SUCTIONS) ×5 IMPLANT
TUBE CONNECTING 20'X1/4 (TUBING) ×1
TUBE CONNECTING 20X1/4 (TUBING) ×5 IMPLANT
TUBING EXTENTION W/L.L. (IV SETS) ×6 IMPLANT
UNDERPAD 30X30 (UNDERPADS AND DIAPERS) ×6 IMPLANT
WATER STERILE IRR 1000ML POUR (IV SOLUTION) ×6 IMPLANT

## 2018-05-30 NOTE — Anesthesia Procedure Notes (Signed)
Procedure Name: Intubation Date/Time: 05/30/2018 7:31 AM Performed by: Bryson Corona, CRNA Pre-anesthesia Checklist: Patient identified, Emergency Drugs available, Suction available and Patient being monitored Patient Re-evaluated:Patient Re-evaluated prior to induction Oxygen Delivery Method: Circle System Utilized Preoxygenation: Pre-oxygenation with 100% oxygen Induction Type: IV induction Ventilation: Oral airway inserted - appropriate to patient size Laryngoscope Size: Mac and 4 Grade View: Grade I Tube type: Oral Tube size: 7.5 mm Number of attempts: 1 Airway Equipment and Method: Stylet and Oral airway Placement Confirmation: ETT inserted through vocal cords under direct vision,  positive ETCO2 and breath sounds checked- equal and bilateral Secured at: 21 cm Tube secured with: Tape Dental Injury: Teeth and Oropharynx as per pre-operative assessment

## 2018-05-30 NOTE — Telephone Encounter (Signed)
sch appt lvm 06/12/18 11am p/o MD

## 2018-05-30 NOTE — Progress Notes (Signed)
Dr. [name] at bedside, aware of no pedal pulses

## 2018-05-30 NOTE — Interval H&P Note (Signed)
History and Physical Interval Note:  05/30/2018 7:06 AM  Jacob Avery  has presented today for surgery, with the diagnosis of [place]  The various methods of treatment have been discussed with the patient and family. After consideration of risks, benefits and other options for treatment, the patient has consented to  Procedure(s): BYPASS GRAFT LEFT AXILLA-BIFEMORAL (Left) ENDARTERECTOMY FEMORAL BILATERAL (Bilateral) as a surgical intervention .  The patient's history has been reviewed, patient examined, no change in status, stable for surgery.  I have reviewed the patient's chart and labs.  Questions were answered to the patient's satisfaction.     Adele Barthel

## 2018-05-30 NOTE — Op Note (Signed)
OPERATIVE NOTE   PROCEDURE: 1. Left axillobifemoral bypass 2. Left femoral endarterectomy 3. Open cannulation of axillofemoral graft 4. Bilateral leg runoff 5. Bilateral incision negative pressure dressing placement  PRE-OPERATIVE DIAGNOSIS: Right > left rest pain  POST-OPERATIVE DIAGNOSIS: same as above   CO-SURGEON: Adele Barthel, MD; Gae Gallop, MD  ASSISTANT(S): Servando Snare, MD; Dagoberto Ligas, [facility]   ANESTHESIA: general  ESTIMATED BLOOD LOSS: 400 cc  FINDING(S): 1.  Extensive fatty subcutaneous tissue throughout 2.  Chronically occluded and heavily calcified right common femoral artery  3.  Left distal common femoral artery compressible but exophytic plaques within lumen requiring limited endarterectomy of distal common femoral artery into proximal profunda femoral artery and superficial femoral artery  4.  Faintly dopplerable right posterior tibial artery and non-dopplerable left tibial arteries 5.  Neither common femoral arteries neither femorofemoral graft could be imaged due to position of patient on table and limitation of the table movement 6.  Right leg:  Extensive collaterals reconstitute popliteal artery artery  Primary runoff in right leg via posterior tibial artery > peroneal artery  7.  Left leg:  Extensive collaterals reconstitute distal superficial femoral artery leading into popliteal artery   Primary runoff in left leg via posterior tibial artery > peroneal artery  SPECIMEN(S):  none   INDICATIONS:   Jacob Avery is a 72 y.o. male who presents with right > left leg rest pain.  His aortogram demonstrate possible chronic aortic dissection.  His bilateral runoff suggested bilateral external iliac artery occlusion and bilateral superficial femoral artery.  Distal runoff was obscured by wash out.  In the interval time period, much of this patient right foot ulcers resolved.  Subsequently, I suggested trying an left axillobifemoral bypass to see if  this would resolve his residual rest pain.  The risk, benefits, and alternative for bypass operations were discussed with the patient.  The patient is aware the risks include but are not limited to: bleeding, infection, myocardial infarction, stroke, limb loss, nerve damage, limb edema, need for additional procedures in the future, wound complications, and inability to complete the bypass.  The patient is aware of these risks and agreed to proceed.   DESCRIPTION: After obtaining full informed written consent, the patient was brought back to the operating room and placed supine upon the operating table.  The patient received IV antibiotics prior to induction.  A procedure time out was completed and the correct surgical site was verified.  After obtaining adequate anesthesia, the patient was prepped and draped in the standard fashion for: left axillobifemoral bypass.    A two-surgeon technique was completed to limit the patient's operative time in the event concomittant femoropopliteal bypass is necessary.  Dr. Scot Dock turned his attention to the right groin.  See his notes for details.  I turned my attention to the left chest.  I made an infraclavicular incision two finger-width inferior to the mid-segment of the left clavicle.  I made an incision here and dissected down to pectoral fascia.  I opened the fascia and then used a muscle splitting technique to get down to the subclavian vein.  I widely dissected out the field surround the subclavian vein to get laxity in the vein, avoiding traction injury on the vein.  I was able to retract the vein out of the way to get access to the left subclavian artery.  I dissected out a 5 cm segment of the subclavian artery.  I place a vessel loop to help with clamping.  The axillary artery had some calcification proximally but I felt it was soft enough to use as inflow.  I placed this incision with a wet sponge.  I turned my attention to the left groin.  Using Sonosite, I  identified the left common femoral artery.  I made an oblique incision one finger-width above the groin incision.  I dissected down to the femoral sheath.  There was extensive periarterial scarring.  Eventually, I was able to fully dissect out the common femoral artery, proximally to external iliac artery and distally to the femoral bifurcation.  Both the profunda femoral artery and superficial femoral artery were dissected out.    At this point, Dr. Scot Dock had completed the difficult dissection of the right common femoral artery, superficial femoral artery and profunda femoral artery.  I dissected a subcutaneous tunnel immediately anterior to his his pubic bone, as I did not want the graft to route more superior due this patient large pannus, which might kink the femorofemoral graft route.  I passed a ringed clamp through the suprapubic tunnel and passed a 8 mm Dacron graft.  I then dissected a tunnel behind the pectoralis minor and then routed a long tunneler immediate superficial to the rib cage, taking care to avoid entering the lung space or abdomen, exiting at the left groin incision.  I sewed a 8 mm Dacron graft to the inner cannula of the tunneler.  I pulled the 8 mm graft through the metal tunnel, taking care to maintain orientation.  The metal tunnel was removed, leaving the graft in place.  I pulled the graft to tension and leaving adequate redundant graft in each incision.    The patient was given 13000 units of Heparin intravenously, which was a therapeutic bolus.  After waiting 3 minutes, Dr. Scot Dock started of the right anastomosis of the femorofemoral graft.  See his Op Note for details.  I turned my attention to the left infraclavicular incision.  I retracted the skin, muscle and vein out of the way.  I clamped and rotated the left axillary artery with two Henley clamps.  I had marked the segment for the anastomosis on the inferior edge of the artery.  I made an arteriotomy and then extended it  proximally and distally.  Distally the artery was disease free but proximally the artery had some calcific disease which had cracked.  I blunt removed the loss pieces of this plaque.  I spatulated the 8 mm Dacron graft, shortening the graft in the process.  I had already taken into account some extra length in this segment to allow the left shoulder to extend proximally.  I sewed the graft to the axillary artery with an end-to-side anastomosis with 5-0 Prolene with a running stitch.  I took care to take large stitches in the disease segment.  I fore and backbled the axillary artery prior to completion of the anastomosis.  No clot or loose plaque was noted.  I completed this anastomosis in the usual fashion.  I released all clamps.  There was bleeding from two pleats which were repaired with 5-0 Prolene stitches.  There was no further active bleeding.  I packed the left infraclavicular incision with Avitene.  I turned my attention to the left groin.  The exposure of the left femoral artery was completed.  I clamped the left external iliac artery and the superficial femoral artery and profunda femoral artery.  An arteriotomy was made in the distal common femoral artery and extended proximally with  a Engineer, drilling.  There appeared to be some exophytic plaques that required endarterectomy.  I extended the arteriotomy into the superficial femoral artery.  Using a Penfield, a limited dissection was completed of the distal common femoral artery and proximal superficial femoral artery and profunda femoral artery.  The superficial femoral artery was chronically occluded but endarterectomy of the proximal profunda resulted in vigorous back bleeding.    The profunda femoral artery was reclamped.  I pulled the left axillofemoral graft to tension and spatulated the graft to the geometry of this arteriotomy.  I sewed the Dacron graft to the distal common femoral artery/proximal superficial femoral artery with a running stitch  of 5-0 Prolene in an end-to-side configuration.  Prior to completion, the left external iliac artery and profunda femoral artery were backbled: no clot was noted.  The anastomosis was completed in the usual fashion.    At this point, Dr. Scot Dock completed the left side of the femorofemoral anastomosis to the side of the left axillofemoral graft.  See his Op Note for details.    I washed out all incision.  Bleeding points were repaired in each incision with suture repairs of 5-0 Prolene in the suture lines and electrocautery in the subcutaneous tissue.  All incisions were packed with Avitene.  The patient had a brief period of hypotension at this point down to systolic blood pressures in the 60s  Additionally, he had been bradycardiac throughout the case, so I want to limit further operative time.  The patient was resuscitated by Anesthesia and he recovered rapidly.  The patient was given 70 mg of Protamine.  I washed out all incisions and repacked all incisions with Avitene.  I turned my attention to left infraclavicular incision.  There was no active bleeding.  The pectoralis muscle was reapproximated with a running 2-0 Vicryl to cover the graft.  The pectoralis fascia was reapproximated with a running 2-0 Vicryl.  The subcutaneous tissue was reapproximated with a double layer of 3-0 Vicryl.  The skin was reapproximated with a running subcuticular stitch of 4-0 Monocryl.  The skin was cleaned, dried, and reinforced with Dermabond.  Each groin was washed out.  No active bleeding was present.  Each groin was repaired with a double layer of 2-0 Vicryl superficial to the bypass grafts.  The superficial subcutaneous tissue in each groin was reapproximated with a layer of 3-0 Vicryl.  The skin in groin was reapproximated with a running subcuticular stitch of 4-0 Monocryl.    I broke scrub and looked at each foot.  Both were pink and appeared well perfused.  I could only get a posterior tibial artery signal off  the right foot.  I did not appreciate any ulcers in the right foot.  I felt completion angiography was going to be necessary.  I sharply took down all the layers the left groin closure.  I cannulated the left axillofemoral graft proximal to the femorofemoral bypass with a micropuncture needle and passed a microwire into the axillofemoral graft.  The needle was exchanged for a microsheath.  The dilator and wire were removed.  I connected the sheath to the IV extension tubing.  I clamped the femorofemoral graft.    I completed a left leg runoff in stages with hand injections.  The findings demonstrated rapid collateral filling of a distal superficial femoral artery filling a popliteal artery with posterior tibial artery > peroneal artery runoff.  I removed the femorofemoral clamp.  I sewed a 6-0 Prolene around  the sheath and then pulled out the sheath while taking out the sheath.    I turned my attention to the femorofemoral graft in the left groin.  I cannulated the left side of the femorofemoral graft with a micropuncture needle.  I passed the microwire into the femorofemoral graft.   The needle was exchanged for a microsheath.  The dilator and wire were removed.  I connected the sheath to the IV extension tubing.  I clamped the distal common femoral artery.  I completed the right leg runoff in stations with hand injections.  This demonstrated extensive collaterals through the thigh which reconstituted a popliteal artery artery.  The images appeared washed out but there appeared to be posterior tibial artery > peroneal artery runoff on this side.  I removed the clamp on the distal common femoral artery.  I sewed a 6-0 Prolene around the sheath and then pulled out the sheath while taking out the sheath.    I washed out the left groin.  There was no active bleeding.  I repair the left groin as done previously.  At this point, I elected to apply bilateral Prevena incision VAC dressing due this patient large  pannus that sits over his groins.  At this point, I obtained two small Prevena VAC sponges.  This was applied to each groin.  The adhesive cover was affixed to the skin.  The two VAC sponges were connected via tubing to a Y-connector and then connected to the VAC pump which was engaged at 125 continuous.  The VAC sponge became adherent.   COMPLICATIONS: none  CONDITION: stable   Adele Barthel, MD, [facility] [place] Office: [phone] Pager: [phone]  05/30/2018, 12:21 PM

## 2018-05-30 NOTE — Transfer of Care (Signed)
Immediate Anesthesia Transfer of Care Note  Patient: Jacob Avery  Procedure(s) Performed: BYPASS GRAFT LEFT AXILLA-BIFEMORAL WITH HEMO SHIELD GOLD VASCULAR GRAFTS (Left ) ENDARTERECTOMY FEMORAL LEFT (Bilateral ) INTRA OPERATIVE ARTERIOGRAM BILATERAL LOWER EXTREMITIES (Bilateral Leg Lower) APPLICATION OF WOUND VAC (Bilateral Groin)  Patient Location: PACU  Anesthesia Type:General  Level of Consciousness: awake, alert  and oriented  Airway & Oxygen Therapy: Patient Spontanous Breathing  Post-op Assessment: Report given to RN and Post -op Vital signs reviewed and stable  Post vital signs: Reviewed and stable  Last Vitals:  Vitals Value Taken Time  BP    Temp    Pulse    Resp    SpO2      Last Pain:  Vitals:   05/30/18 0633  PainSc: 0-No pain         Complications: No apparent anesthesia complications

## 2018-05-30 NOTE — Anesthesia Postprocedure Evaluation (Signed)
Anesthesia Post Note  Patient: Wasif Simonich  Procedure(s) Performed: BYPASS GRAFT LEFT AXILLA-BIFEMORAL WITH HEMO SHIELD GOLD VASCULAR GRAFTS (Left ) ENDARTERECTOMY FEMORAL LEFT (Bilateral ) INTRA OPERATIVE ARTERIOGRAM BILATERAL LOWER EXTREMITIES (Bilateral Leg Lower) APPLICATION OF WOUND VAC (Bilateral Groin)     Patient location during evaluation: PACU Anesthesia Type: General Level of consciousness: awake and alert Pain management: pain level controlled Vital Signs Assessment: post-procedure vital signs reviewed and stable Respiratory status: spontaneous breathing, nonlabored ventilation, respiratory function stable and patient connected to nasal cannula oxygen Cardiovascular status: blood pressure returned to baseline and stable Postop Assessment: no apparent nausea or vomiting Anesthetic complications: no    Last Vitals:  Vitals:   05/30/18 1247 05/30/18 1302  BP: 140/72 (!) 128/41  Pulse: 63 62  Resp: 18 17  Temp:    SpO2: (!) 89% 95%    Last Pain:  Vitals:   05/30/18 1302  PainSc: 0-No pain                 Esaias Cleavenger

## 2018-05-30 NOTE — Anesthesia Procedure Notes (Signed)
Arterial Line Insertion Start/End7/30/2019 6:45 AM, 05/30/2018 6:50 AM Performed by: Janeece Riggers, MD, Carney Living, CRNA, CRNA  Patient location: Pre-op. Preanesthetic checklist: patient identified, IV checked, site marked, risks and benefits discussed, surgical consent, monitors and equipment checked, pre-op evaluation, timeout performed and anesthesia consent Lidocaine 1% used for infiltration Right, radial was placed Catheter size: 20 G Hand hygiene performed , maximum sterile barriers used  and Seldinger technique used  Attempts: 1 Procedure performed without using ultrasound guided technique. Following insertion, Biopatch and dressing applied. Post procedure assessment: normal  Patient tolerated the procedure well with no immediate complications.

## 2018-05-30 NOTE — Progress Notes (Signed)
Per CRNA art line not working in [place]. D/c order per [facility].

## 2018-05-30 NOTE — Op Note (Signed)
    NAME: Jacob Avery    MRN: [id number] DOB: 1946-02-23    DATE OF OPERATION: 05/30/2018  PREOP DIAGNOSIS:    Critical limb ischemia   POSTOP DIAGNOSIS:    Same  PROCEDURE:    1.  Exposure of right common femoral artery 2.  Left to right femorofemoral bypass with 8 mm Dacron graft  CO-SURGEON's: Judeth Cornfield. Scot Dock, MD, Adele Barthel, MD  ASSIST: Servando Snare, MD, Arlee Muslim, MD  ANESTHESIA: General  EBL: Per anesthesia record  INDICATIONS:    Jacob Avery is a 72 y.o. male who presents for axillobifemoral bypass. Given the complexity of the case, and the patients comorbidities, a two team approach was used in order to expedite the procedure.   FINDINGS:   There was good Doppler signals in both branches of the deep femoral artery at the completion.  TECHNIQUE:    The patient was taken to the operating room and received a general anesthetic.  Given his morbid obesity an oblique incision was made in the right groin after the patient had been prepped and draped in usual sterile fashion.  The dissection was carried down to the superficial femoral artery has the bifurcation was somewhat high.  The superficial femoral artery is occluded.  The common femoral artery was severely diseased with no place to clamp or so.  The deep femoral artery was occluded proximally but I dissected down to the secondary branches which were soft and I felt this was the best place to terminate the right limb of the femorofemoral bypass.  The femorofemoral graft had been tunneled between the 2 groin incisions.  After the patient had been heparinized.  The deep femoral artery and branches were controlled and a longitudinal arteriotomy made in the most lateral branch which was soft.  The 8 mm graft was spatulated and sewn into side to this branch using continuous 5-0 Prolene suture.  This anastomosis was not tied so that we could flush through this anastomosis prior to completing it.  An 8 mm Dacron  graft had been tunneled between the 2 incisions after the left femoral artery had been exposed in the left axillofemoral bypass graft sewn to the left common femoral artery.  This graft was clamped proximally and distally and a longitudinal arteriotomy was made.  The femorofemoral graft was cut the appropriate length and sewn into side to the axillofemoral graft with running 5-0 Prolene suture.  Next the right femoral anastomosis was clamped proximally distally and the anastomosis completed.  Flow was reestablished of the right leg which the patient tolerated from a hemodynamic standpoint. At the completion of the procedure hemostasis was obtained in the wound.  The right groin incision was closed with 2 deep layers of 2-0 Vicryl and the skin closed with 4-0 Vicryl.  A Praveena dressing was applied.   Deitra Mayo, MD, FACS [facility]  DATE OF DICTATION:   05/30/2018

## 2018-05-31 ENCOUNTER — Encounter (HOSPITAL_COMMUNITY): Payer: Self-pay | Admitting: Vascular Surgery

## 2018-05-31 ENCOUNTER — Other Ambulatory Visit: Payer: Self-pay

## 2018-05-31 ENCOUNTER — Inpatient Hospital Stay (HOSPITAL_COMMUNITY): Payer: Medicare Other

## 2018-05-31 DIAGNOSIS — I739 Peripheral vascular disease, unspecified: Secondary | ICD-10-CM

## 2018-05-31 LAB — BASIC METABOLIC PANEL
Anion gap: 9 (ref 5–15)
BUN: 14 mg/dL (ref 8–23)
CHLORIDE: 103 mmol/L (ref 98–111)
CO2: 28 mmol/L (ref 22–32)
CREATININE: 1.11 mg/dL (ref 0.61–1.24)
Calcium: 8.3 mg/dL — ABNORMAL LOW (ref 8.9–10.3)
GFR calc non Af Amer: >60 mL/min (ref >60)
Glucose, Bld: 209 mg/dL — ABNORMAL HIGH (ref 70–99)
Potassium: 3.8 mmol/L (ref 3.5–5.1)
Sodium: 140 mmol/L (ref 135–145)

## 2018-05-31 LAB — CBC
HCT: 33.7 % — ABNORMAL LOW (ref 39.0–52.0)
HEMOGLOBIN: 10.3 g/dL — AB (ref 13.0–17.0)
MCH: 24.3 pg — AB (ref 26.0–34.0)
MCHC: 30.6 g/dL (ref 30.0–36.0)
MCV: 79.5 fL (ref 78.0–100.0)
Platelets: 242 10*3/uL (ref 150–400)
RBC: 4.24 MIL/uL (ref 4.22–5.81)
RDW: 17.2 % — ABNORMAL HIGH (ref 11.5–15.5)
WBC: 8.8 10*3/uL (ref 4.0–10.5)

## 2018-05-31 LAB — GLUCOSE, CAPILLARY
GLUCOSE-CAPILLARY: 189 mg/dL — AB (ref 70–99)
GLUCOSE-CAPILLARY: 257 mg/dL — AB (ref 70–99)
GLUCOSE-CAPILLARY: 215 mg/dL — AB (ref 70–99)
GLUCOSE-CAPILLARY: 179 mg/dL — AB (ref 70–99)
Glucose-Capillary: 177 mg/dL — ABNORMAL HIGH (ref 70–99)

## 2018-05-31 NOTE — Progress Notes (Signed)
OT Cancellation Note  Patient Details Name: Ladd Cen MRN: [id number] DOB: 1945-12-20   Cancelled Treatment:    Reason Eval/Treat Not Completed: Patient at procedure or test/ unavailable(Transport arriving to take to vascular. Will return as schedule allows. Thank you.)  [place], OTR/L Acute Rehab Pager: [phone] Office: [phone] 05/31/2018, 9:02 AM

## 2018-05-31 NOTE — Evaluation (Signed)
Physical Therapy Evaluation Patient Details Name: Jacob Avery MRN: [id number] DOB: [birth year] Today's Date: 05/31/2018   History of Present Illness  72 yo male s/p left axillobifemoral bypass and femoral endarterectomy, exposure of right common femoral artery, and bilateral leg runoff. PMH including arthritis, CVA, DM type 2, gout, HTN, obesity, and PVD.   Clinical Impression  Pt admitted with above diagnosis. Pt currently with functional limitations due to the deficits listed below (see PT Problem List). Prior to admission, patient independent with ADL's and mobility. Currently ambulating 200 feet with no assistive device and min guard assist. Displays antalgic gait pattern secondary to groin pain. Will trial cane next session to determine if external support provides increased pain control and decreases gait abnormalities. Suspect patient will continue to display improvements in balance with increased mobility. Pt will benefit from skilled PT to increase their independence and safety with mobility to allow discharge to the venue listed below.       Follow Up Recommendations No PT follow up;Supervision for mobility/OOB    Equipment Recommendations  None recommended by PT    Recommendations for Other Services       Precautions / Restrictions Precautions Precautions: Fall Restrictions Weight Bearing Restrictions: No      Mobility  Bed Mobility Overal bed mobility: Modified Independent             General bed mobility comments: HOB elevated and increased time  Transfers Overall transfer level: Needs assistance Equipment used: None Transfers: Sit to/from Stand Sit to Stand: Min guard         General transfer comment: Min Guard A for safety. +2 for first time out of bed and for safety. Pt with increased balance and strength as session progressed  Ambulation/Gait Ambulation/Gait assistance: Min guard Gait Distance (Feet): 200 Feet Assistive device: None Gait  Pattern/deviations: Step-through pattern;Wide base of support;Antalgic     General Gait Details: Patient requiring min guard to steady but overall no overt LOB. Antalgic gait pattern noted with patient utilizing a wide BOS.  Stairs            Wheelchair Mobility    Modified Rankin (Stroke Patients Only)       Balance Overall balance assessment: Mild deficits observed, not formally tested                                           Pertinent Vitals/Pain Pain Assessment: Faces Faces Pain Scale: Hurts little more Pain Location: Bilateral hips/groin Pain Descriptors / Indicators: Constant;Discomfort;Pressure Pain Intervention(s): Monitored during session    Home Living Family/patient expects to be discharged to:: Private residence Living Arrangements: Spouse/significant other Available Help at Discharge: Family Type of Home: House Home Access: Stairs to enter Entrance Stairs-Rails: Psychiatric nurse of Steps: [place]: One level;Laundry or work area in [employer]: None      Prior Function Level of Independence: Independent         Comments: ADLs, IADLs, driving     Hand Dominance   Dominant Hand: Right    Extremity/Trunk Assessment   Upper Extremity Assessment Upper Extremity Assessment: Overall WFL for tasks assessed    Lower Extremity Assessment Lower Extremity Assessment: RLE deficits/detail;LLE deficits/detail RLE Deficits / Details: s/p bilateral leg run off and exposure of right common femoral artery. Limited forward flexion at hips due to pain, however strength 5/5. RLE Coordination: decreased gross  motor LLE Deficits / Details: s/p left axillobifemoral bypass and femoral endarterectomy. Limited forward flexion at hips due to pain, however strength 5/5.  LLE Coordination: decreased gross motor    Cervical / Trunk Assessment Cervical / Trunk Assessment: Other exceptions Cervical / Trunk  Exceptions: Increased body habitus.  Communication   Communication: No difficulties  Cognition Arousal/Alertness: Awake/alert Behavior During Therapy: WFL for tasks assessed/performed Overall Cognitive Status: Within Functional Limits for tasks assessed                                 General Comments: Very pleasant and agreeable to participate in therapy      General Comments General comments (skin integrity, edema, etc.): VSS    Exercises     Assessment/Plan    PT Assessment Patient needs continued PT services  PT Problem List Decreased strength;Decreased activity tolerance;Decreased balance;Decreased mobility;Pain       PT Treatment Interventions DME instruction;Gait training;Stair training;Functional mobility training;Therapeutic activities;Therapeutic exercise;Balance training;Patient/family education    PT Goals (Current goals can be found in the Care Plan section)  Acute Rehab PT Goals Patient Stated Goal: "Walk without cramps" PT Goal Formulation: With patient Time For Goal Achievement: 06/14/18 Potential to Achieve Goals: Good    Frequency Min 3X/week   Barriers to discharge        Co-evaluation PT/OT/SLP Co-Evaluation/Treatment: Yes Reason for Co-Treatment: To address functional/ADL transfers   OT goals addressed during session: ADL's and self-care       AM-PAC PT "6 Clicks" Daily Activity  Outcome Measure Difficulty turning over in bed (including adjusting bedclothes, sheets and blankets)?: None Difficulty moving from lying on back to sitting on the side of the bed? : A Little Difficulty sitting down on and standing up from a chair with arms (e.g., wheelchair, bedside commode, etc,.)?: A Little Help needed moving to and from a bed to chair (including a wheelchair)?: A Little Help needed walking in hospital room?: A Little Help needed climbing 3-5 steps with a railing? : A Little 6 Click Score: 19    End of Session Equipment Utilized  During Treatment: Gait belt Activity Tolerance: Patient tolerated treatment well Patient left: in chair;with call bell/phone within reach Nurse Communication: Mobility status PT Visit Diagnosis: Unsteadiness on feet (R26.81);Pain;Difficulty in walking, not elsewhere classified (R26.2) Pain - part of body: (groin)    Time: 8144-8185 PT Time Calculation (min) (ACUTE ONLY): 20 min   Charges:   PT Evaluation $PT Eval Moderate Complexity: 1 Mod          Ellamae Sia, PT, DPT Acute Rehabilitation Services  Pager: [phone]   Willy Eddy 05/31/2018, 11:00 AM

## 2018-05-31 NOTE — Progress Notes (Signed)
05/31/2018 1745 Pt walked with assistance of the daughter down the long hall and back.  Pt tolerated well.   [place]

## 2018-05-31 NOTE — Progress Notes (Addendum)
  Progress Note    05/31/2018 7:38 AM 1 Day Post-Op  Subjective:  Denies rest pain BLE.  Soreness to B groin incisions   Vitals:   05/31/18 0407 05/31/18 0600  BP: (!) 112/98 134/68  Pulse: (!) 54 (!) 59  Resp: 17 16  Temp: 98.3 F (36.8 C)   SpO2: 97% 98%   Physical Exam: Lungs:  Non labored on RA Incisions:  L chest incision soft without drainage; B groin incisions with incisional vacs and good seal; no collection in vac cannister Extremities:  Brisk PTA signals BLE; motor and sensation intact BLE Abdomen:  Soft; no impressive hematoma along tunneled ax-fem tract Neurologic: A&O  CBC    Component Value Date/Time   WBC 8.8 05/31/2018 0443   RBC 4.24 05/31/2018 0443   HGB 10.3 (L) 05/31/2018 0443   HCT 33.7 (L) 05/31/2018 0443   PLT 242 05/31/2018 0443   MCV 79.5 05/31/2018 0443   MCH 24.3 (L) 05/31/2018 0443   MCHC 30.6 05/31/2018 0443   RDW 17.2 (H) 05/31/2018 0443   LYMPHSABS 2.4 10/25/2014 1311   MONOABS 0.6 10/25/2014 1311   EOSABS 0.1 10/25/2014 1311   BASOSABS 0.0 10/25/2014 1311    BMET    Component Value Date/Time   NA 140 05/31/2018 0443   K 3.8 05/31/2018 0443   CL 103 05/31/2018 0443   CO2 28 05/31/2018 0443   GLUCOSE 209 (H) 05/31/2018 0443   BUN 14 05/31/2018 0443   CREATININE 1.11 05/31/2018 0443   CALCIUM 8.3 (L) 05/31/2018 0443   GFRNONAA >60 05/31/2018 0443   GFRAA >60 05/31/2018 0443    INR    Component Value Date/Time   INR 0.97 05/26/2018 0921     Intake/Output Summary (Last 24 hours) at 05/31/2018 0738 Last data filed at 05/31/2018 0655 Gross per 24 hour  Intake 2640 ml  Output 2330 ml  Net 310 ml    Assessment/Plan:  72 y.o. male is s/p L axillofemoral bypass and L to R femorofemoral bypass with limited L CFA endarterectomy 1 Day Post-Op   Perfusing BLE well with PTA doppler signals bilaterally D/c foley PT/OT to eval and treat today ABIs pending Home when pain controlled and mobility increased  DVT prophylaxis:   subq heparin   Dagoberto Ligas, PA-C [facility] [phone] 05/31/2018 7:38 AM   Addendum  I have independently interviewed and examined the patient, and I agree with the physician assistant's findings.  No signs of hematoma at surgical incision.  No anginal sx overnight.  No ptx on CXR.  PT/OT.  Home once pain improved and patient ambulating.  Keep Prevena in place for 7-10 days.  Adele Barthel, MD, FACS [facility] [place] Office: [phone] Pager: [phone]  05/31/2018, 8:26 AM

## 2018-05-31 NOTE — Plan of Care (Signed)
  Problem: Activity: Goal: Ability to return to baseline activity level will improve Outcome: Progressing   Problem: Cardiovascular: Goal: Vascular access site(s) Level 0-1 will be maintained Outcome: Progressing   Problem: Clinical Measurements: Goal: Respiratory complications will improve Outcome: Progressing

## 2018-05-31 NOTE — Progress Notes (Signed)
Had great visit with patient, his daughters, wife and grandchildren.  Had prayer regarding his healing process and for his care team and for his family as they come by to keep them all safe. Jacob Avery, Chaplain   05/31/18 1600  Clinical Encounter Type  Visited With Patient and family together  Visit Type Initial;Spiritual support  Referral From Nurse  Consult/Referral To Chaplain  Spiritual Encounters  Spiritual Needs Prayer  Stress Factors  Patient Stress Factors Not reviewed  Family Stress Factors Not reviewed

## 2018-05-31 NOTE — Progress Notes (Signed)
PT Cancellation Note  Patient Details Name: Jacob Avery MRN: [id number] DOB: 1946-10-10   Cancelled Treatment:    Reason Eval/Treat Not Completed: Patient at procedure or test/unavailable. Leaving for vascular. Will follow.  Ellamae Sia, PT, DPT Acute Rehabilitation Services  Pager: [phone]    Willy Eddy 05/31/2018, 9:01 AM

## 2018-05-31 NOTE — Evaluation (Signed)
Occupational Therapy Evaluation Patient Details Name: Jacob Avery MRN: [id number] DOB: Jan 13, 1946 Today's Date: 05/31/2018    History of Present Illness 72 yo male s/p left axillobifemoral bypass and femoral endarterectomy, exposure of right common femoral artery, and bilateral leg runoff. PMH including arthritis, CVA, DM type 2, gout, HTN, obesity, and PVD.    Clinical Impression   PTA, pt was living with his wife and was independent. Pt currently requiring Max A for LB ADLs and Min Guard A for functional mobility and ADLs in standing. Pt presenting with decreased balance and ROM at hips due to pain decreasing independence with LB ADLs. Pt would benefit from further acute OT to address LB ADLs and safe tub transfers. Recommend dc to home with initial 24 hour support once medically stable per physician.    Follow Up Recommendations  No OT follow up;Supervision/Assistance - 24 hour    Equipment Recommendations  None recommended by OT    Recommendations for Other Services PT consult     Precautions / Restrictions Precautions Precautions: Fall Restrictions Weight Bearing Restrictions: No      Mobility Bed Mobility Overal bed mobility: Modified Independent             General bed mobility comments: HOB elevated and increased time  Transfers Overall transfer level: Needs assistance Equipment used: None Transfers: Sit to/from Stand Sit to Stand: Min guard         General transfer comment: Min Guard A for safety. +2 for first time out of bed and for safety. Pt with increased balance and strength as session progressed    Balance Overall balance assessment: Mild deficits observed, not formally tested                                         ADL either performed or assessed with clinical judgement   ADL Overall ADL's : Needs assistance/impaired Eating/Feeding: Set up;Sitting   Grooming: Min guard;Oral care;Standing   Upper Body Bathing: Set  up;Supervision/ safety;Sitting   Lower Body Bathing: Maximal assistance;Sit to/from stand Lower Body Bathing Details (indicate cue type and reason): Decreasd forward flexion Upper Body Dressing : Set up;Supervision/safety;Sitting Upper Body Dressing Details (indicate cue type and reason): Donning second gown liek a jacket Lower Body Dressing: Maximal assistance;Sit to/from stand Lower Body Dressing Details (indicate cue type and reason): Max A to adjust socks due top limited forward flexion at hips Toilet Transfer: Min guard;Ambulation(Simulated to recliner) Armed forces technical officer Details (indicate cue type and reason): Min Guard A for safety         Functional mobility during ADLs: Min guard General ADL Comments: Pt performing grooming, UB ADLs, and functional mobility at [employer] level. Requiring Max A for LB ADLs due to pain with forward flexion     Vision         Perception     Praxis      Pertinent Vitals/Pain Pain Assessment: Faces Faces Pain Scale: Hurts little more Pain Location: Bilateral hips/groin Pain Descriptors / Indicators: Constant;Discomfort;Pressure Pain Intervention(s): Monitored during session;Limited activity within patient's tolerance;Repositioned;Premedicated before session     Hand Dominance Right   Extremity/Trunk Assessment Upper Extremity Assessment Upper Extremity Assessment: Overall WFL for tasks assessed   Lower Extremity Assessment Lower Extremity Assessment: RLE deficits/detail;LLE deficits/detail RLE Deficits / Details: s/p bialteral leg run off and exposure of right common femoral artery. Limited forward flexion at hips due to  pain. RLE Coordination: decreased gross motor LLE Deficits / Details: s/p left axillobifemoral bypass and femoral endarterectomy. Limited forward flexion at hips due to pain.  LLE Coordination: decreased gross motor   Cervical / Trunk Assessment Cervical / Trunk Assessment: Other exceptions Cervical / Trunk  Exceptions: Increased body habitus.   Communication Communication Communication: No difficulties   Cognition Arousal/Alertness: Awake/alert Behavior During Therapy: WFL for tasks assessed/performed Overall Cognitive Status: Within Functional Limits for tasks assessed                                 General Comments: Very pleasant and agreeable to participate in therapy   General Comments  VSS    Exercises     Shoulder Instructions      Home Living Family/patient expects to be discharged to:: Private residence Living Arrangements: Spouse/significant other Available Help at Discharge: Family Type of Home: House Home Access: Stairs to enter [employer] of Steps: 4 Entrance Stairs-Rails: Right;Left Home Layout: One level;Laundry or work area in basement     [employer] Shower/Tub: Corporate investment banker: Handicapped [place]: None          Prior Functioning/Environment Level of Independence: Independent        Comments: ADLs, IADLs, driving        OT Problem List: Decreased strength;Decreased range of motion;Decreased activity tolerance;Impaired balance (sitting and/or standing);Pain;Decreased knowledge of use of DME or AE;Decreased knowledge of precautions      OT Treatment/Interventions: Self-care/ADL training;Therapeutic exercise;Energy conservation;DME and/or AE instruction;Therapeutic activities;Patient/family education    OT Goals(Current goals can be found in the care plan section) Acute Rehab OT Goals Patient Stated Goal: "Walk without cramps" OT Goal Formulation: With patient Time For Goal Achievement: 06/14/18 Potential to Achieve Goals: Good  OT Frequency: Min 3X/week   Barriers to D/C:            Co-evaluation PT/OT/SLP Co-Evaluation/Treatment: Yes Reason for Co-Treatment: To address functional/ADL transfers   OT goals addressed during session: ADL's and self-care      AM-PAC  PT "6 Clicks" Daily Activity     Outcome Measure Help from another person eating meals?: None Help from another person taking care of personal grooming?: A Little Help from another person toileting, which includes using toliet, bedpan, or urinal?: A Little Help from another person bathing (including washing, rinsing, drying)?: A Lot Help from another person to put on and taking off regular upper body clothing?: None Help from another person to put on and taking off regular lower body clothing?: A Lot 6 Click Score: 18   End of Session Equipment Utilized During Treatment: Gait belt Nurse Communication: Mobility status  Activity Tolerance: Patient tolerated treatment well Patient left: in chair;with call bell/phone within reach  OT Visit Diagnosis: Unsteadiness on feet (R26.81);Other abnormalities of gait and mobility (R26.89);Muscle weakness (generalized) (M62.81);Pain Pain - Right/Left: Left(Bilateral) Pain - part of body: Hip                Time: 3536-1443 OT Time Calculation (min): 20 min Charges:  OT General Charges $OT Visit: 1 Visit OT Evaluation $OT Eval Moderate Complexity: [place], OTR/L Acute Rehab Pager: [phone] Office: [place] 05/31/2018, 10:31 AM

## 2018-05-31 NOTE — Progress Notes (Signed)
VASCULAR LAB PRELIMINARY  ARTERIAL  ABI completed:    RIGHT    LEFT    PRESSURE WAVEFORM  PRESSURE WAVEFORM  BRACHIAL 128 Triphasic BRACHIAL 113 Triphasic  DP 51 monopasic DP 80 Biphasic         PT 58 monophasic PT 80 Biphasic          GREAT TOE  NA GREAT TOE  NA    RIGHT LEFT  ABI 0.45 0.63   Right resting ankle brachial index indicates severe right lower extremity arterial disease.   Left resting ankle brachial index indicates moderate left lower extremity arterial disease.   Jacob Avery 05/31/2018, 9:57 AM

## 2018-06-01 LAB — GLUCOSE, CAPILLARY
GLUCOSE-CAPILLARY: 204 mg/dL — AB (ref 70–99)
GLUCOSE-CAPILLARY: 204 mg/dL — AB (ref 70–99)
Glucose-Capillary: 223 mg/dL — ABNORMAL HIGH (ref 70–99)
Glucose-Capillary: 181 mg/dL — ABNORMAL HIGH (ref 70–99)

## 2018-06-01 MED ORDER — COLLAGENASE 250 UNIT/GM EX OINT
TOPICAL_OINTMENT | Freq: Every day | CUTANEOUS | Status: DC
  Administered 2018-06-01 – 2018-06-02 (×2): 1 via TOPICAL
  Filled 2018-06-01 (×2): qty 30

## 2018-06-01 NOTE — Progress Notes (Signed)
Physical Therapy Treatment Patient Details Name: Jacob Avery MRN: [id number] DOB: May 17, 1946 Today's Date: 06/01/2018    History of Present Illness 72 yo male s/p left axillobifemoral bypass and femoral endarterectomy, exposure of right common femoral artery, and bilateral leg runoff. PMH including arthritis, CVA, DM type 2, gout, HTN, obesity, and PVD.     PT Comments    Pt admitted with above diagnosis. Pt currently with functional limitations due to balance and endurance deficits. Pt was able to ambulate with cane but is deciding whether he wants to get one or not.  Pt ambulates with out cane as well with incr sway bilaterally.  Pt approaching baseline and has been walking in halls with wife.  Will continue and progress as pt tolerates.    Pt will benefit from skilled PT to increase their independence and safety with mobility to allow discharge to the venue listed below.     Follow Up Recommendations  No PT follow up;Supervision for mobility/OOB     Equipment Recommendations  None recommended by PT(Pt may get a cane at drug store)    Recommendations for Other Services       Precautions / Restrictions Precautions Precautions: Fall Precaution Comments: VAC in place Restrictions Weight Bearing Restrictions: No    Mobility  Bed Mobility Overal bed mobility: Modified Independent             General bed mobility comments: HOB elevated   Transfers Overall transfer level: Needs assistance Equipment used: None Transfers: Sit to/from Stand Sit to Stand: Min guard         General transfer comment: Min Guard A for safety  Ambulation/Gait Ambulation/Gait assistance: Min guard Gait Distance (Feet): 200 Feet Assistive device: None;Straight cane Gait Pattern/deviations: Step-through pattern;Wide base of support;Antalgic   Gait velocity interpretation: <1.31 ft/sec, indicative of household ambulator General Gait Details: Patient requiring min guard to steady but overall no  overt LOB. Antalgic gait pattern noted with patient utilizing a wide BOS.  Instructed pt in use of cane in right hand.  Pt does appear to be steadier with the cane and pt states he will pick one up at drug strore if he decides he needs one.     Stairs             Wheelchair Mobility    Modified Rankin (Stroke Patients Only)       Balance Overall balance assessment: Needs assistance         Standing balance support: During functional activity;No upper extremity supported Standing balance-Leahy Scale: Fair Standing balance comment: can stand statically without device.                            Cognition Arousal/Alertness: Awake/alert Behavior During Therapy: WFL for tasks assessed/performed Overall Cognitive Status: Within Functional Limits for tasks assessed                                 General Comments: Very pleasant and agreeable to participate in therapy      Exercises      General Comments General comments (skin integrity, edema, etc.): VSS      Pertinent Vitals/Pain Pain Assessment: No/denies pain Faces Pain Scale: Hurts a little bit Pain Location: Bilateral hips/groin Pain Descriptors / Indicators: Constant;Discomfort;Pressure Pain Intervention(s): Monitored during session    Home Living  Prior Function            PT Goals (current goals can now be found in the care plan section) Acute Rehab PT Goals Patient Stated Goal: to go home Progress towards PT goals: Progressing toward goals    Frequency    Min 3X/week      PT Plan Current plan remains appropriate    Co-evaluation              AM-PAC PT "6 Clicks" Daily Activity  Outcome Measure  Difficulty turning over in bed (including adjusting bedclothes, sheets and blankets)?: None Difficulty moving from lying on back to sitting on the side of the bed? : A Little Difficulty sitting down on and standing up from a chair with  arms (e.g., wheelchair, bedside commode, etc,.)?: A Little Help needed moving to and from a bed to chair (including a wheelchair)?: A Little Help needed walking in hospital room?: A Little Help needed climbing 3-5 steps with a railing? : A Little 6 Click Score: 19    End of Session Equipment Utilized During Treatment: Gait belt Activity Tolerance: Patient tolerated treatment well Patient left: with call bell/phone within reach;in bed Nurse Communication: Mobility status PT Visit Diagnosis: Unsteadiness on feet (R26.81);Pain;Difficulty in walking, not elsewhere classified (R26.2) Pain - part of body: (groin)     Time: 7078-6754 PT Time Calculation (min) (ACUTE ONLY): 14 min  Charges:  $Gait Training: 8-22 mins                     [place] [phone] (pager)    Denice Paradise 06/01/2018, 1:49 PM

## 2018-06-01 NOTE — Progress Notes (Signed)
Inpatient Diabetes Program Recommendations  AACE/ADA: New Consensus Statement on Inpatient Glycemic Control (2015)  Target Ranges:  Prepandial:   less than 140 mg/dL      Peak postprandial:   less than 180 mg/dL (1-2 hours)      Critically ill patients:  140 - 180 mg/dL   Lab Results  Component Value Date   GLUCAP 181 (H) 06/01/2018   HGBA1C 7.6 (H) 05/26/2018    Review of Glycemic ControlResults for Jacob Avery, Jacob Avery (MRN [id number]) as of 06/01/2018 14:10  Ref. Range 05/31/2018 12:10 05/31/2018 17:01 05/31/2018 20:55 06/01/2018 06:01 06/01/2018 11:54  Glucose-Capillary Latest Ref Range: 70 - 99 mg/dL 257 (H) 215 (H) 179 (H) 223 (H) 181 (H)    Diabetes history: Type 2 DM  Outpatient Diabetes medications: Lantus 40 units q AM and 30 units q PM, Humalog 10 units bid Current orders for Inpatient glycemic control:  Novolog moderate tid with meals  Inpatient Diabetes Program Recommendations:   Please consider restarting a portion of patient's home basal insulin.  Consider Lantus 25 units daily.   Thanks,  Adah Perl, RN, BC-ADM Inpatient Diabetes Coordinator Pager [phone] (8a-5p)

## 2018-06-01 NOTE — Progress Notes (Signed)
Occupational Therapy Treatment Patient Details Name: Jacob Avery MRN: [id number] DOB: [birth year] Today's Date: 06/01/2018    History of present illness 72 yo male s/p left axillobifemoral bypass and femoral endarterectomy, exposure of right common femoral artery, and bilateral leg runoff. PMH including arthritis, CVA, DM type 2, gout, HTN, obesity, and PVD.    OT comments  Pt is demonstrating progress towards goals as pt with decreased pain in groin with mobility and self-care tasks.  Pt completed LB dressing, donning pants with increased forward flexion and demonstrating ability to cross legs over opposite knees to don socks.  Demonstrated reacher for LB dressing, however pt did not need. Pt reports and demonstrates much improved mobility with decrease in pain/soreness groin.   Pt will continue to benefit from OT acutely to prepare to d/c home.  Follow Up Recommendations  No OT follow up;Supervision/Assistance - 24 hour    Equipment Recommendations  None recommended by OT       Precautions / Restrictions Precautions Precautions: Fall       Mobility Bed Mobility Overal bed mobility: Modified Independent             General bed mobility comments: HOB elevated          Balance Overall balance assessment: Mild deficits observed, not formally tested(min guard with LB dressing)                                         ADL either performed or assessed with clinical judgement   ADL Overall ADL's : Needs assistance/impaired Eating/Feeding: Set up;Sitting       Upper Body Bathing: Set up;Supervision/ safety;Sitting   Lower Body Bathing: Min guard;Sit to/from stand Lower Body Bathing Details (indicate cue type and reason): improved forward flexion with pt able to reach towards feet and able to cross legs over opposite knee  Upper Body Dressing : Set up;Supervision/safety;Sitting   Lower Body Dressing: Min guard;Sit to/from stand Lower Body Dressing Details  (indicate cue type and reason): forward flexion to reach towards feet and donned pants, crossed leg over opposite knee to don socks             Functional mobility during ADLs: Min guard General ADL Comments: Overall min guard to supervision with LB ADLs as pt with improved hip flexion, reporting "sore" but no pain               Cognition Arousal/Alertness: Awake/alert Behavior During Therapy: WFL for tasks assessed/performed Overall Cognitive Status: Within Functional Limits for tasks assessed                                 General Comments: Very pleasant and agreeable to participate in therapy                   Pertinent Vitals/ Pain       Pain Assessment: Faces Faces Pain Scale: Hurts a little bit Pain Location: Bilateral hips/groin Pain Descriptors / Indicators: Constant;Discomfort;Pressure Pain Intervention(s): Monitored during session         Frequency  Min 3X/week        Progress Toward Goals  OT Goals(current goals can now be found in the care plan section)  Progress towards OT goals: Progressing toward goals  Acute Rehab OT Goals Patient Stated Goal: to go home OT Goal  Formulation: With patient Time For Goal Achievement: 06/14/18 Potential to Achieve Goals: Good  Plan Discharge plan remains appropriate       AM-PAC PT "6 Clicks" Daily Activity     Outcome Measure   Help from another person eating meals?: None Help from another person taking care of personal grooming?: A Little Help from another person toileting, which includes using toliet, bedpan, or urinal?: A Little Help from another person bathing (including washing, rinsing, drying)?: A Little Help from another person to put on and taking off regular upper body clothing?: None Help from another person to put on and taking off regular lower body clothing?: A Little 6 Click Score: 20    End of Session    OT Visit Diagnosis: Unsteadiness on feet (R26.81);Other  abnormalities of gait and mobility (R26.89);Muscle weakness (generalized) (M62.81);Pain Pain - Right/Left: (bilateral) Pain - part of body: Hip   Activity Tolerance Patient tolerated treatment well   Patient Left in bed;with call bell/phone within reach;with nursing/sitter in room   Nurse Communication Mobility status        Time: 5909-3112 OT Time Calculation (min): 13 min  Charges: OT General Charges $OT Visit: 1 Visit OT Treatments $Self Care/Home Management : 8-22 mins    Simonne Come, [phone] 06/01/2018, 12:06 PM

## 2018-06-01 NOTE — Progress Notes (Addendum)
  Progress Note    06/01/2018 7:36 AM 2 Days Post-Op  Subjective:  Feeling more comfortable this morning.  Soreness to groin incisions   Vitals:   05/31/18 2358 06/01/18 0416  BP: 112/61 134/63  Pulse: 84 (!) 56  Resp: 15 (!) 23  Temp: 98.6 F (37 C) 99.1 F (37.3 C)  SpO2: 90% 94%   Physical Exam: Cardiac:  Irregular Lungs:  Non labored on room air Incisions:  L chest incision soft without hematoma or drainage; B groin incisions with incisional vacs in place with good seal, no output Extremities: PTA signals by doppler Abdomen: soft Neurologic: A&O  CBC    Component Value Date/Time   WBC 8.8 05/31/2018 0443   RBC 4.24 05/31/2018 0443   HGB 10.3 (L) 05/31/2018 0443   HCT 33.7 (L) 05/31/2018 0443   PLT 242 05/31/2018 0443   MCV 79.5 05/31/2018 0443   MCH 24.3 (L) 05/31/2018 0443   MCHC 30.6 05/31/2018 0443   RDW 17.2 (H) 05/31/2018 0443   LYMPHSABS 2.4 10/25/2014 1311   MONOABS 0.6 10/25/2014 1311   EOSABS 0.1 10/25/2014 1311   BASOSABS 0.0 10/25/2014 1311    BMET    Component Value Date/Time   NA 140 05/31/2018 0443   K 3.8 05/31/2018 0443   CL 103 05/31/2018 0443   CO2 28 05/31/2018 0443   GLUCOSE 209 (H) 05/31/2018 0443   BUN 14 05/31/2018 0443   CREATININE 1.11 05/31/2018 0443   CALCIUM 8.3 (L) 05/31/2018 0443   GFRNONAA >60 05/31/2018 0443   GFRAA >60 05/31/2018 0443    INR    Component Value Date/Time   INR 0.97 05/26/2018 0921     Intake/Output Summary (Last 24 hours) at 06/01/2018 0736 Last data filed at 06/01/2018 0302 Gross per 24 hour  Intake 118 ml  Output 575 ml  Net -457 ml     Assessment/Plan:  72 y.o. male is s/p L axillofemoral bypass and L to R femorofemoral bypass with limited L CFA endarterectomy    2 Days Post-Op   Perfusing BLE well with PTA doppler signals BLE; R ABI improved to 0.5; L ABI unchanged 0.6 Continue incisional vacs B groins for 7-10 days Encouraged OOB; no recommendations for continued treatment from  PT/OT Probable discharge tomorrow  DVT prophylaxis:  subq heparin   Dagoberto Ligas, PA-C [facility] [phone] 06/01/2018 7:36 AM  Addendum  I have independently interviewed and examined the patient, and I agree with the physician assistant's findings.      RIGHT    LEFT    PRESSURE WAVEFORM  PRESSURE WAVEFORM  BRACHIAL 128 Triphasic BRACHIAL 113 Triphasic  DP 51 monopasic DP 80 Biphasic         PT 58 monophasic PT 80 Biphasic          GREAT TOE  NA GREAT TOE  NA    RIGHT LEFT  ABI 0.45 0.63    Patient's preop R ABI was 0, so this is an improvement from baseline.  Pt right foot swelling is reflection of the augmentation in blood flow.  VAC need to be removed next Wednesday in the clinic.    Would start some Santyl on R shin wound.  Will need home health wound care for such.   Adele Barthel, MD, FACS [facility] [place] Office: [phone] Pager: [phone]  06/01/2018, 9:28 AM

## 2018-06-02 LAB — TYPE AND SCREEN
ABO/RH(D): A POS
ANTIBODY SCREEN: NEGATIVE
Unit division: 0
Unit division: 0

## 2018-06-02 LAB — BPAM RBC
BLOOD PRODUCT EXPIRATION DATE: 201908102359
BLOOD PRODUCT EXPIRATION DATE: 201908152359
UNIT TYPE AND RH: 6200
Unit Type and Rh: 6200

## 2018-06-02 LAB — GLUCOSE, CAPILLARY: GLUCOSE-CAPILLARY: 173 mg/dL — AB (ref 70–99)

## 2018-06-02 MED ORDER — OXYCODONE-ACETAMINOPHEN 5-325 MG PO TABS: 1.0000 | ORAL_TABLET | Freq: Four times a day (QID) | ORAL | 0 refills | Status: DC | PRN

## 2018-06-02 NOTE — Care Management Note (Signed)
Case Management Note  Patient Details  Name: Jacob Avery MRN: [id number] Date of Birth: 1946-10-09  Subjective/Objective:            Admitted w PAD.        Action/Plan:  PAtient will DC to home w spouse. He will have two proveena VACs to B groin at DC. He would like to use [facility] for [facility] services, referral made to [facility]. RN at bedside to teach wife how to do dressing changes to leg and provide with some supplies as well. No other CM needs identified.   Expected Discharge Date:  06/02/18               Expected Discharge Plan:  [place]  In-House Referral:     Discharge planning Services  CM Consult  Post Acute Care Choice:  Home Health Choice offered to:  Patient, Spouse  DME Arranged:    DME Agency:     HH Arranged:  RN [place] Agency:  [place]  Status of Service:  Completed, signed off  If discussed at [place] of Stay Meetings, dates discussed:    Additional Comments:  Carles Collet, RN 06/02/2018, 10:10 AM

## 2018-06-02 NOTE — Progress Notes (Signed)
Patient sleeping had short run of SVT with pair of PVC'S rate 130-140's

## 2018-06-02 NOTE — Progress Notes (Signed)
   Daily Progress Note   Assessment/Planning:   POD #3 s/p L ax-bifem BPG, limited L CFA EA   Ok to D/C with home service for R shin ulcer   Subjective  - 3 Days Post-Op   No complaints, walking better   Objective   Vitals:   06/01/18 2017 06/02/18 0003 06/02/18 0414 06/02/18 0825  BP: 138/68 120/63 (!) 154/98 135/73  Pulse: 64 (!) 59 (!) 55 62  Resp: 19 18 18 18   Temp: 99.5 F (37.5 C) 98.4 F (36.9 C) 98.4 F (36.9 C) 98.4 F (36.9 C)  TempSrc: Oral Oral Oral Oral  SpO2: 92% 93% 96% 97%  Weight:   278 lb 10.6 oz (126.4 kg)      Intake/Output Summary (Last 24 hours) at 06/02/2018 1042 Last data filed at 06/02/2018 0900 Gross per 24 hour  Intake 960 ml  Output 675 ml  Net 285 ml    PULM  CTAB  CV  RRR  GI  soft, NTND  VASC R ant shin ulcer unchanged, B groin VAC adherent, R infraclav inc c/d/i, palpable fem-fem pulse  NEURO No change in distal motor and sensation    Laboratory   CBC CBC Latest Ref Rng & Units 05/31/2018 05/30/2018 05/26/2018  WBC 4.0 - 10.5 K/uL 8.8 14.4(H) 8.9  Hemoglobin 13.0 - 17.0 g/dL 10.3(L) 11.3(L) 12.2(L)  Hematocrit 39.0 - 52.0 % 33.7(L) 36.7(L) 40.4  Platelets 150 - 400 K/uL 242 275 283    BMET    Component Value Date/Time   NA 140 05/31/2018 0443   K 3.8 05/31/2018 0443   CL 103 05/31/2018 0443   CO2 28 05/31/2018 0443   GLUCOSE 209 (H) 05/31/2018 0443   BUN 14 05/31/2018 0443   CREATININE 1.11 05/31/2018 0443   CALCIUM 8.3 (L) 05/31/2018 0443   GFRNONAA >60 05/31/2018 0443   GFRAA >60 05/31/2018 0443     Adele Barthel, MD, FACS [facility] [place] Office: [phone] Pager: [phone]  06/02/2018, 10:42 AM

## 2018-06-02 NOTE — Discharge Instructions (Signed)
 [facility] [place]  Discharge instructions  Lower Extremity Bypass Surgery  Please refer to the following instruction for your post-procedure care. Your surgeon or physician assistant will discuss any changes with you.  Activity  You are encouraged to walk as much as you can. You can slowly return to normal activities during the month after your surgery. Avoid strenuous activity and heavy lifting until your doctor tells you it's OK. Avoid activities such as vacuuming or swinging a golf club. Do not drive until your doctor give the OK and you are no longer taking prescription pain medications. It is also normal to have difficulty with sleep habits, eating and bowel movement after surgery. These will go away with time.  Bathing/Showering  You may shower after you go home. Do not soak in a bathtub, hot tub, or swim until the incision heals completely.  Incision Care  Clean your incision with mild soap and water. Shower every day. Pat the area dry with a clean towel. You do not need a bandage unless otherwise instructed. Do not apply any ointments or creams to your incision. If you have open wounds you will be instructed how to care for them or a visiting nurse may be arranged for you. If you have staples or sutures along your incision they will be removed at your post-op appointment. You may have skin glue on your incision. Do not peel it off. It will come off on its own in about one week. If you have a great deal of moisture in your groin, use a gauze help keep this area dry.  Diet  Resume your normal diet. There are no special food restrictions following this procedure. A low fat/ low cholesterol diet is recommended for all patients with vascular disease. In order to heal from your surgery, it is CRITICAL to get adequate nutrition. Your body requires vitamins, minerals, and protein. Vegetables are the best source of vitamins and minerals. Vegetables also provide the  perfect balance of protein. Processed food has little nutritional value, so try to avoid this.  Medications  Resume taking all your medications unless your doctor or nurse practitioner tells you not to. If your incision is causing pain, you may take over-the-counter pain relievers such as acetaminophen (Tylenol). If you were prescribed a stronger pain medication, please aware these medication can cause nausea and constipation. Prevent nausea by taking the medication with a snack or meal. Avoid constipation by drinking plenty of fluids and eating foods with high amount of fiber, such as fruits, vegetables, and grains. Take Colase 100 mg (an over-the-counter stool softener) twice a day as needed for constipation. Do not take Tylenol if you are taking prescription pain medications.  Follow Up  Our office will schedule a follow up appointment 2-3 weeks following discharge.  Please call us immediately for any of the following conditions  Severe or worsening pain in your legs or feet while at rest or while walking Increase pain, redness, warmth, or drainage (pus) from your incision site(s) Fever of 101 degree or higher The swelling in your leg with the bypass suddenly worsens and becomes more painful than when you were in the hospital If you have been instructed to feel your graft pulse then you should do so every day. If you can no longer feel this pulse, call the office immediately. Not all patients are given this instruction.  Leg swelling is common after leg bypass surgery.  The swelling should improve over a few months   following surgery. To improve the swelling, you may elevate your legs above the level of your heart while you are sitting or resting. Your surgeon or physician assistant may ask you to apply an ACE wrap or wear compression (TED) stockings to help to reduce swelling.  Reduce your risk of vascular disease  Stop smoking. If you would like help call [facility] at 1-800-QUIT-NOW  (1-800-784-8669) or [place] at [phone].  Manage your cholesterol Maintain a desired weight Control your diabetes weight Control your diabetes Keep your blood pressure down  If you have any questions, please call the office at [phone]   

## 2018-06-02 NOTE — Progress Notes (Signed)
Pt discharged home with wife. IVs and telemetry box removed. Prevena wound vacs placed and intact. Dressing changed to right leg ulcer. Pt and pt's wife received discharge instructions and all questions were answered. Pt received paper prescription for pain medication and was instructed to get it filled at his pharmacy. Pt verbalized understanding. Pt left with all of his belongings. Pt discharged via wheelchair and was accompanied by a Wilfrid Lund.    Ara Kussmaul BSN, RN

## 2018-06-03 DIAGNOSIS — Z7982 Long term (current) use of aspirin: Secondary | ICD-10-CM | POA: Diagnosis not present

## 2018-06-03 DIAGNOSIS — K5790 Diverticulosis of intestine, part unspecified, without perforation or abscess without bleeding: Secondary | ICD-10-CM | POA: Diagnosis not present

## 2018-06-03 DIAGNOSIS — Z48812 Encounter for surgical aftercare following surgery on the circulatory system: Secondary | ICD-10-CM | POA: Diagnosis not present

## 2018-06-03 DIAGNOSIS — M109 Gout, unspecified: Secondary | ICD-10-CM | POA: Diagnosis not present

## 2018-06-03 DIAGNOSIS — I129 Hypertensive chronic kidney disease with stage 1 through stage 4 chronic kidney disease, or unspecified chronic kidney disease: Secondary | ICD-10-CM | POA: Diagnosis not present

## 2018-06-03 DIAGNOSIS — M1990 Unspecified osteoarthritis, unspecified site: Secondary | ICD-10-CM | POA: Diagnosis not present

## 2018-06-03 DIAGNOSIS — L97811 Non-pressure chronic ulcer of other part of right lower leg limited to breakdown of skin: Secondary | ICD-10-CM | POA: Diagnosis not present

## 2018-06-03 DIAGNOSIS — I70238 Atherosclerosis of native arteries of right leg with ulceration of other part of lower right leg: Secondary | ICD-10-CM | POA: Diagnosis not present

## 2018-06-03 DIAGNOSIS — Z7902 Long term (current) use of antithrombotics/antiplatelets: Secondary | ICD-10-CM | POA: Diagnosis not present

## 2018-06-03 DIAGNOSIS — F17210 Nicotine dependence, cigarettes, uncomplicated: Secondary | ICD-10-CM | POA: Diagnosis not present

## 2018-06-03 DIAGNOSIS — I70223 Atherosclerosis of native arteries of extremities with rest pain, bilateral legs: Secondary | ICD-10-CM | POA: Diagnosis not present

## 2018-06-03 DIAGNOSIS — Z794 Long term (current) use of insulin: Secondary | ICD-10-CM | POA: Diagnosis not present

## 2018-06-03 DIAGNOSIS — Z6841 Body Mass Index (BMI) 40.0 and over, adult: Secondary | ICD-10-CM | POA: Diagnosis not present

## 2018-06-03 DIAGNOSIS — I69392 Facial weakness following cerebral infarction: Secondary | ICD-10-CM | POA: Diagnosis not present

## 2018-06-03 DIAGNOSIS — E1151 Type 2 diabetes mellitus with diabetic peripheral angiopathy without gangrene: Secondary | ICD-10-CM | POA: Diagnosis not present

## 2018-06-03 DIAGNOSIS — E1122 Type 2 diabetes mellitus with diabetic chronic kidney disease: Secondary | ICD-10-CM | POA: Diagnosis not present

## 2018-06-03 DIAGNOSIS — N182 Chronic kidney disease, stage 2 (mild): Secondary | ICD-10-CM | POA: Diagnosis not present

## 2018-06-03 DIAGNOSIS — Z4800 Encounter for change or removal of nonsurgical wound dressing: Secondary | ICD-10-CM | POA: Diagnosis not present

## 2018-06-03 DIAGNOSIS — G4733 Obstructive sleep apnea (adult) (pediatric): Secondary | ICD-10-CM | POA: Diagnosis not present

## 2018-06-05 NOTE — Discharge Summary (Addendum)
Physician Discharge Summary   Patient ID: Jacob Avery [id number] 72 y.o. 04/11/1946  Admit date: 05/30/2018  Discharge date and time: 06/02/2018 11:13 AM   Admitting Physician: Conrad Harvey, MD   Discharge Physician: same  Admission Diagnoses: [place]  Discharge Diagnoses:  PAD  Admission Condition: fair  Discharged Condition: fair  Indication for Admission: rest pain RLE > LLE   Hospital Course: Mr. Oberholzer is a 72 year old male who was brought in as an outpatient for left axillo-bifemoral bypass with left femoral endarterectomy by Dr. Vallarie Mare and Dr. Doren Custard on 05/30/2018.  The patient tolerated this procedure well and was admitted to the hospital postoperatively.  This should also be mentioned that incisional vacuum dressings were placed on bilateral groin incisions.  POD #1 patient was perfusing bilateral lower extremities well by Doppler.  Physical therapy and Occupational Therapy were consulted.  No further recommendations were made by either therapy team.  Postoperative hospital course consisted of increasing mobility and pain control.  Incision of left chest as well as bilateral groin sites remained unremarkable throughout postoperative hospital course.  Right pretibial ulceration received wound care including Santyl and dressing changes.  These will be continued by home health RN which was arranged by the case manager.  He will follow-up in office with Dr. [name] in 1 week to assess circulation as well as to remove incisional vacuum dressings of bilateral groin incisions.  He will continue his aspirin, Plavix, and statin regimen.  He will be prescribed several days of narcotic pain medication for continued postoperative pain control.  Discharge instructions were reviewed with the patient and his wife and they voiced their understanding.  He will be discharged in stable condition.  It should also be noted that postoperative ABIs were performed postoperatively and  demonstrated improvement from unobtainable waveform to 0.45 on the right lower extremity.  Left lower extremity ABIs remained unchanged.  Consults: None  Treatments: surgery: L axillobifemoral bypass with L femoral endarterectomy by Dr. [name] and Dr. Scot Dock on 05/30/18  Discharge Exam: see progress note 06/02/18 Vitals:   06/02/18 0414 06/02/18 0825  BP: (!) 154/98 135/73  Pulse: (!) 55 62  Resp: 18 18  Temp: 98.4 F (36.9 C) 98.4 F (36.9 C)  SpO2: 96% 97%    Disposition: home with HH  - For VQI Registry use ---  Post-op:  Wound infection: No  Graft infection: No  Transfusion: No  New Arrhythmia: No Ipsilateral amputation: [x ] no, [ ]  Minor, [ ]  BKA, [ ]  AKA Patency judged by: [x ] Dopper only, [ ]  Palpable graft pulse, [ ]  Palpable distal pulse, [ ]  ABI inc. > 0.15, [ ]  Duplex Discharge ABI: R 0.45, L 0.63 D/C Ambulatory Status: Ambulatory with Assistance  Complications: MI: [ ]  No, [ ]  Troponin only, [ ]  EKG or Clinical CHF: No Resp failure: [x ] none, [ ]  Pneumonia, [ ]  Ventilator Chg in renal function: [x ] none, [ ]  Inc. Cr > 0.5, [ ]  Temp. Dialysis, [ ]  Permanent dialysis Stroke: [ x] None, [ ]  Minor, [ ]  Major Return to OR: No  Reason for return to OR: [ ]  Bleeding, [ ]  Infection, [ ]  Thrombosis, [ ]  Revision  Discharge medications: Statin use:  Yes ASA use:  Yes Plavix use:  Yes Beta blocker use: No  for medical reason not indicated Coumadin use: No  for medical reason not indicated    Patient Instructions:  Allergies as of 06/02/2018   No  Known Allergies     Medication List    TAKE these medications   amLODipine 10 MG tablet Commonly known as:  NORVASC Take 10 mg by mouth every evening.   aspirin EC 81 MG tablet Take 81 mg by mouth daily.   atorvastatin 80 MG tablet Commonly known as:  LIPITOR Take 80 mg by mouth daily.   clopidogrel 75 MG tablet Commonly known as:  PLAVIX Take 75 mg by mouth daily.   COLCRYS 0.6 MG tablet Generic drug:   colchicine Take 0.6 mg by mouth daily as needed (for gout flare ups.).   ezetimibe 10 MG tablet Commonly known as:  ZETIA Take 10 mg by mouth daily.   ferrous sulfate 325 (65 FE) MG tablet Take 325 mg by mouth daily with breakfast.   insulin lispro 100 UNIT/ML KiwkPen Commonly known as:  HUMALOG Inject 10 Units into the skin 2 (two) times daily before a meal.   LANTUS SOLOSTAR 100 UNIT/ML Solostar Pen Generic drug:  Insulin Glargine Inject 30-40 Units into the skin See admin instructions. Inject 40 units subcutaneously in the morning, and 30 units subcutaneously in the evening   losartan-hydrochlorothiazide 100-25 MG tablet Commonly known as:  HYZAAR Take 1 tablet by mouth daily.   OVER THE COUNTER MEDICATION Apply 1 application topically daily as needed (pain). Two [place] otc muscle rub   oxyCODONE-acetaminophen 5-325 MG tablet Commonly known as:  PERCOCET/ROXICET Take 1 tablet by mouth every 6 (six) hours as needed for moderate pain.   TYLENOL ARTHRITIS PAIN 650 MG CR tablet Generic drug:  acetaminophen Take 1,300 mg by mouth 2 (two) times daily as needed for pain.   VICKS SINEX 12 HOUR NA Place 1 spray into both nostrils daily as needed (congestion).      Activity: activity as tolerated Diet: regular diet Wound Care: as directed  Follow-up with Dr. [name] in 1 week.  Signed: Dagoberto Ligas 06/05/2018 1:40 PM   Addendum  I agree with my PA's discharge summary.  This patient will follow up in the office for Belva removal in the office.  Longitudinally, this patient will follow up with Dr. Scot Dock after his post-op appointment on 12 AUG 19.  Adele Barthel, MD, FACS [facility] [place] Office: [phone] Pager: [phone]  06/05/2018, 3:01 PM

## 2018-06-06 DIAGNOSIS — L97811 Non-pressure chronic ulcer of other part of right lower leg limited to breakdown of skin: Secondary | ICD-10-CM | POA: Diagnosis not present

## 2018-06-06 DIAGNOSIS — Z48812 Encounter for surgical aftercare following surgery on the circulatory system: Secondary | ICD-10-CM | POA: Diagnosis not present

## 2018-06-06 DIAGNOSIS — I70223 Atherosclerosis of native arteries of extremities with rest pain, bilateral legs: Secondary | ICD-10-CM | POA: Diagnosis not present

## 2018-06-06 DIAGNOSIS — I69392 Facial weakness following cerebral infarction: Secondary | ICD-10-CM | POA: Diagnosis not present

## 2018-06-06 DIAGNOSIS — E1151 Type 2 diabetes mellitus with diabetic peripheral angiopathy without gangrene: Secondary | ICD-10-CM | POA: Diagnosis not present

## 2018-06-06 DIAGNOSIS — I70238 Atherosclerosis of native arteries of right leg with ulceration of other part of lower right leg: Secondary | ICD-10-CM | POA: Diagnosis not present

## 2018-06-07 ENCOUNTER — Other Ambulatory Visit: Payer: Self-pay

## 2018-06-07 ENCOUNTER — Ambulatory Visit (INDEPENDENT_AMBULATORY_CARE_PROVIDER_SITE_OTHER): Payer: Medicare Other | Admitting: Vascular Surgery

## 2018-06-07 ENCOUNTER — Encounter: Payer: Self-pay | Admitting: Vascular Surgery

## 2018-06-07 VITALS — BP 109/56 | HR 57 | Temp 98.4°F | Resp 18 | Ht 67.0 in | Wt 277.0 lb

## 2018-06-07 DIAGNOSIS — I70229 Atherosclerosis of native arteries of extremities with rest pain, unspecified extremity: Secondary | ICD-10-CM

## 2018-06-07 DIAGNOSIS — I998 Other disorder of circulatory system: Secondary | ICD-10-CM

## 2018-06-07 NOTE — Progress Notes (Signed)
    Postoperative Visit   History of Present Illness   Jacob Avery is a 72 y.o. year old male who presents for postoperative follow-up for: L ax-bi-fem BPG (05/30/18).  The patient's wounds are healing.  The patient notes improvement in lower extremity symptoms.  The patient is able to complete their activities of daily living.  The patient's current symptoms are: none.  Pt returns today for Prevena removal.   For VQI Use Only   PRE-ADM LIVING: Home  AMB STATUS: Ambulatory   Physical Examination   Vitals:   06/07/18 1400  BP: (!) 109/56  Pulse: (!) 57  Resp: 18  Temp: 98.4 F (36.9 C)  TempSrc: Oral  SpO2: 97%  Weight: 277 lb (125.6 kg)  Height: 5\' 7"  (1.702 m)   L chest: infraclavicular incision c/d/i  B groins: c/d/i, no erythema, no maceration, pannus overlies both incisions,   Vasc: dopplerable signals throughout feet   Medical Decision Making   Jacob Avery is a 72 y.o. year old male who presents s/p L ax-fem-fem BPG.   The patient's bypass incisions are healing appropriately with resolution of pre-operative symptoms.  I gave the patient strict instructions for care of his groin incisions.    Wash both groins daily with soap and water  Dry both groins and cover with sterile bandages.  If groins become moist, repeat the above instructions Patient will follow up with Dr. Scot Dock, my co-surgeon from the procedure, to establish longitudinal care on 21 AUG 19 and to re-evaluate both groin incisions. Thank you for allowing [place] to participate in this patient's care.   Adele Barthel, MD, FACS [facility] [employer] Office: [phone] Pager: [phone]

## 2018-06-08 DIAGNOSIS — L97811 Non-pressure chronic ulcer of other part of right lower leg limited to breakdown of skin: Secondary | ICD-10-CM | POA: Diagnosis not present

## 2018-06-08 DIAGNOSIS — E1151 Type 2 diabetes mellitus with diabetic peripheral angiopathy without gangrene: Secondary | ICD-10-CM | POA: Diagnosis not present

## 2018-06-08 DIAGNOSIS — I69392 Facial weakness following cerebral infarction: Secondary | ICD-10-CM | POA: Diagnosis not present

## 2018-06-08 DIAGNOSIS — I70223 Atherosclerosis of native arteries of extremities with rest pain, bilateral legs: Secondary | ICD-10-CM | POA: Diagnosis not present

## 2018-06-08 DIAGNOSIS — I70238 Atherosclerosis of native arteries of right leg with ulceration of other part of lower right leg: Secondary | ICD-10-CM | POA: Diagnosis not present

## 2018-06-08 DIAGNOSIS — Z48812 Encounter for surgical aftercare following surgery on the circulatory system: Secondary | ICD-10-CM | POA: Diagnosis not present

## 2018-06-12 ENCOUNTER — Encounter: Payer: Medicare Other | Admitting: Vascular Surgery

## 2018-06-13 DIAGNOSIS — E1151 Type 2 diabetes mellitus with diabetic peripheral angiopathy without gangrene: Secondary | ICD-10-CM | POA: Diagnosis not present

## 2018-06-13 DIAGNOSIS — I70238 Atherosclerosis of native arteries of right leg with ulceration of other part of lower right leg: Secondary | ICD-10-CM | POA: Diagnosis not present

## 2018-06-13 DIAGNOSIS — I69392 Facial weakness following cerebral infarction: Secondary | ICD-10-CM | POA: Diagnosis not present

## 2018-06-13 DIAGNOSIS — I70223 Atherosclerosis of native arteries of extremities with rest pain, bilateral legs: Secondary | ICD-10-CM | POA: Diagnosis not present

## 2018-06-13 DIAGNOSIS — L97811 Non-pressure chronic ulcer of other part of right lower leg limited to breakdown of skin: Secondary | ICD-10-CM | POA: Diagnosis not present

## 2018-06-13 DIAGNOSIS — Z48812 Encounter for surgical aftercare following surgery on the circulatory system: Secondary | ICD-10-CM | POA: Diagnosis not present

## 2018-06-15 DIAGNOSIS — L97811 Non-pressure chronic ulcer of other part of right lower leg limited to breakdown of skin: Secondary | ICD-10-CM | POA: Diagnosis not present

## 2018-06-15 DIAGNOSIS — I70238 Atherosclerosis of native arteries of right leg with ulceration of other part of lower right leg: Secondary | ICD-10-CM | POA: Diagnosis not present

## 2018-06-15 DIAGNOSIS — E1151 Type 2 diabetes mellitus with diabetic peripheral angiopathy without gangrene: Secondary | ICD-10-CM | POA: Diagnosis not present

## 2018-06-15 DIAGNOSIS — Z48812 Encounter for surgical aftercare following surgery on the circulatory system: Secondary | ICD-10-CM | POA: Diagnosis not present

## 2018-06-15 DIAGNOSIS — I70223 Atherosclerosis of native arteries of extremities with rest pain, bilateral legs: Secondary | ICD-10-CM | POA: Diagnosis not present

## 2018-06-15 DIAGNOSIS — I69392 Facial weakness following cerebral infarction: Secondary | ICD-10-CM | POA: Diagnosis not present

## 2018-06-19 ENCOUNTER — Encounter: Payer: Self-pay | Admitting: Vascular Surgery

## 2018-06-19 ENCOUNTER — Ambulatory Visit (INDEPENDENT_AMBULATORY_CARE_PROVIDER_SITE_OTHER): Payer: Medicare Other | Admitting: Vascular Surgery

## 2018-06-19 ENCOUNTER — Other Ambulatory Visit: Payer: Self-pay

## 2018-06-19 VITALS — BP 128/70 | HR 55 | Temp 97.5°F | Resp 20 | Ht 67.0 in | Wt 267.0 lb

## 2018-06-19 DIAGNOSIS — Z48812 Encounter for surgical aftercare following surgery on the circulatory system: Secondary | ICD-10-CM | POA: Diagnosis not present

## 2018-06-19 DIAGNOSIS — I70238 Atherosclerosis of native arteries of right leg with ulceration of other part of lower right leg: Secondary | ICD-10-CM | POA: Diagnosis not present

## 2018-06-19 DIAGNOSIS — I69392 Facial weakness following cerebral infarction: Secondary | ICD-10-CM | POA: Diagnosis not present

## 2018-06-19 DIAGNOSIS — L97811 Non-pressure chronic ulcer of other part of right lower leg limited to breakdown of skin: Secondary | ICD-10-CM | POA: Diagnosis not present

## 2018-06-19 DIAGNOSIS — E1151 Type 2 diabetes mellitus with diabetic peripheral angiopathy without gangrene: Secondary | ICD-10-CM | POA: Diagnosis not present

## 2018-06-19 DIAGNOSIS — I70223 Atherosclerosis of native arteries of extremities with rest pain, bilateral legs: Secondary | ICD-10-CM | POA: Diagnosis not present

## 2018-06-19 NOTE — Progress Notes (Signed)
Patient name: Jacob Avery MRN: [id number] DOB: October 31, 1946 Sex: male  REASON FOR VISIT:   Follow-up after left femoral endarterectomy and left axillobifemoral bypass by Dr. Adele Barthel.  HPI:   Jacob Avery is a pleasant 72 y.o. male who would presented with bilateral lower extremity rest pain.  He underwent a left femoral endarterectomy and left axillobifemoral bypass by Dr. Adele Barthel on 05/30/2018.  He comes in for 3-week follow-up visit.  Patient is doing well and has no specific complaints.  Denies fever or chills.  He had a small amount of serous drainage from the right groin according to the daughter.  The wound on the right leg is gradually improving according to his wife.  Current Outpatient Medications  Medication Sig Dispense Refill  . acetaminophen (TYLENOL ARTHRITIS PAIN) 650 MG CR tablet Take 1,300 mg by mouth 2 (two) times daily as needed for pain.     Marland Kitchen amLODipine (NORVASC) 10 MG tablet Take 10 mg by mouth every evening.     Marland Kitchen aspirin EC 81 MG tablet Take 81 mg by mouth daily.    Marland Kitchen atorvastatin (LIPITOR) 80 MG tablet Take 80 mg by mouth daily.    . clopidogrel (PLAVIX) 75 MG tablet Take 75 mg by mouth daily.     . colchicine (COLCRYS) 0.6 MG tablet Take 0.6 mg by mouth daily as needed (for gout flare ups.).     Marland Kitchen ezetimibe (ZETIA) 10 MG tablet Take 10 mg by mouth daily.    . ferrous sulfate 325 (65 FE) MG tablet Take 325 mg by mouth daily with breakfast.    . insulin lispro (HUMALOG) 100 UNIT/ML KiwkPen Inject 10 Units into the skin 2 (two) times daily before a meal.     . LANTUS SOLOSTAR 100 UNIT/ML Solostar Pen Inject 30-40 Units into the skin See admin instructions. Inject 40 units subcutaneously in the morning, and 30 units subcutaneously in the evening    . losartan-hydrochlorothiazide (HYZAAR) 100-25 MG per tablet Take 1 tablet by mouth daily.    Marland Kitchen OVER THE COUNTER MEDICATION Apply 1 application topically daily as needed (pain). Two Old Goats otc muscle rub    .  oxyCODONE-acetaminophen (PERCOCET/ROXICET) 5-325 MG tablet Take 1 tablet by mouth every 6 (six) hours as needed for moderate pain. 20 tablet 0  . Oxymetazoline HCl (VICKS SINEX 12 HOUR NA) Place 1 spray into both nostrils daily as needed (congestion).     No current facility-administered medications for this visit.     REVIEW OF SYSTEMS:  [X]  denotes positive finding, [ ]  denotes negative finding Vascular    Leg swelling    Cardiac    Chest pain or chest pressure:    Shortness of breath upon exertion:    Short of breath when lying flat:    Irregular heart rhythm:    Constitutional    Fever or chills:     PHYSICAL EXAM:   Vitals:   06/19/18 1440  BP: 128/70  Pulse: (!) 55  Resp: 20  Temp: (!) 97.5 F (36.4 C)  TempSrc: Oral  SpO2: 98%  Weight: 267 lb (121.1 kg)  Height: 5\' 7"  (1.702 m)    GENERAL: The patient is a well-nourished male, in no acute distress. The vital signs are documented above. CARDIOVASCULAR: There is a regular rate and rhythm. PULMONARY: There is good air exchange bilaterally without wheezing or rales. VASCULAR: On the right he has a monophasic anterior tibial and posterior tibial signal. On the left he has  a monophasic peroneal and posterior tibial signal. His groin incisions and left infraclavicular incision are healing well.  DATA:   No new data.  MEDICAL ISSUES:   STATUS POST LEFT AXILLOBIFEMORAL BYPASS: Patient is doing well status post a left axillobifemoral bypass.  The wound on the right anterior leg measures 1 cm in diameter.  They will continue dressing changes with bacitracin and a Band-Aid.  I will see them back in 1 month for a wound check.  We will then need to get them on the schedule for a routine follow-up protocol for his left axillobifemoral bypass.  Deitra Mayo [facility] [phone]

## 2018-06-21 ENCOUNTER — Encounter: Payer: Medicare Other | Admitting: Vascular Surgery

## 2018-06-22 DIAGNOSIS — Z48812 Encounter for surgical aftercare following surgery on the circulatory system: Secondary | ICD-10-CM | POA: Diagnosis not present

## 2018-06-22 DIAGNOSIS — I70223 Atherosclerosis of native arteries of extremities with rest pain, bilateral legs: Secondary | ICD-10-CM | POA: Diagnosis not present

## 2018-06-22 DIAGNOSIS — I70238 Atherosclerosis of native arteries of right leg with ulceration of other part of lower right leg: Secondary | ICD-10-CM | POA: Diagnosis not present

## 2018-06-22 DIAGNOSIS — L97811 Non-pressure chronic ulcer of other part of right lower leg limited to breakdown of skin: Secondary | ICD-10-CM | POA: Diagnosis not present

## 2018-06-22 DIAGNOSIS — I69392 Facial weakness following cerebral infarction: Secondary | ICD-10-CM | POA: Diagnosis not present

## 2018-06-22 DIAGNOSIS — E1151 Type 2 diabetes mellitus with diabetic peripheral angiopathy without gangrene: Secondary | ICD-10-CM | POA: Diagnosis not present

## 2018-06-28 ENCOUNTER — Encounter: Payer: Self-pay | Admitting: Cardiovascular Disease

## 2018-06-28 ENCOUNTER — Ambulatory Visit (INDEPENDENT_AMBULATORY_CARE_PROVIDER_SITE_OTHER): Payer: Medicare Other | Admitting: Cardiovascular Disease

## 2018-06-28 VITALS — BP 134/68 | HR 79 | Ht 67.0 in | Wt 266.0 lb

## 2018-06-28 DIAGNOSIS — R011 Cardiac murmur, unspecified: Secondary | ICD-10-CM | POA: Diagnosis not present

## 2018-06-28 DIAGNOSIS — I739 Peripheral vascular disease, unspecified: Secondary | ICD-10-CM

## 2018-06-28 DIAGNOSIS — L97811 Non-pressure chronic ulcer of other part of right lower leg limited to breakdown of skin: Secondary | ICD-10-CM | POA: Diagnosis not present

## 2018-06-28 DIAGNOSIS — I70223 Atherosclerosis of native arteries of extremities with rest pain, bilateral legs: Secondary | ICD-10-CM | POA: Diagnosis not present

## 2018-06-28 DIAGNOSIS — Z48812 Encounter for surgical aftercare following surgery on the circulatory system: Secondary | ICD-10-CM | POA: Diagnosis not present

## 2018-06-28 DIAGNOSIS — E785 Hyperlipidemia, unspecified: Secondary | ICD-10-CM

## 2018-06-28 DIAGNOSIS — E1151 Type 2 diabetes mellitus with diabetic peripheral angiopathy without gangrene: Secondary | ICD-10-CM | POA: Diagnosis not present

## 2018-06-28 DIAGNOSIS — I25118 Atherosclerotic heart disease of native coronary artery with other forms of angina pectoris: Secondary | ICD-10-CM

## 2018-06-28 DIAGNOSIS — I10 Essential (primary) hypertension: Secondary | ICD-10-CM | POA: Diagnosis not present

## 2018-06-28 DIAGNOSIS — I70238 Atherosclerosis of native arteries of right leg with ulceration of other part of lower right leg: Secondary | ICD-10-CM | POA: Diagnosis not present

## 2018-06-28 DIAGNOSIS — I69392 Facial weakness following cerebral infarction: Secondary | ICD-10-CM | POA: Diagnosis not present

## 2018-06-28 DIAGNOSIS — R0609 Other forms of dyspnea: Secondary | ICD-10-CM

## 2018-06-28 MED ORDER — NITROGLYCERIN 0.4 MG SL SUBL: 0.4000 mg | SUBLINGUAL_TABLET | SUBLINGUAL | 3 refills | Status: AC | PRN

## 2018-06-28 MED ORDER — METOPROLOL TARTRATE 25 MG PO TABS: 25.0000 mg | ORAL_TABLET | Freq: Two times a day (BID) | ORAL | 3 refills | Status: DC

## 2018-06-28 NOTE — Patient Instructions (Signed)
Medication Instructions:  START METOPROLOL 25 MG TWO TIMES DAILY  NITRO (instruction sheet given & explained)    Labwork: I WILL REQUEST LABS FROM PCP  Testing/Procedures: Your physician has requested that you have an echocardiogram. Echocardiography is a painless test that uses sound waves to create images of your heart. It provides your doctor with information about the size and shape of your heart and how well your heart's chambers and valves are working. This procedure takes approximately one hour. There are no restrictions for this procedure.    Follow-Up: Your physician recommends that you schedule a follow-up appointment in: 4 MONTHS    Any Other Special Instructions Will Be Listed Below (If Applicable).     If you need a refill on your cardiac medications before your next appointment, please call your pharmacy.

## 2018-06-28 NOTE — Progress Notes (Signed)
SUBJECTIVE: The patient presents for routine follow-up.  I evaluated him for preoperative risk stratification on 04/19/2018.  He had some exertional dyspnea and an abnormal ECG.  He underwent a nuclear stress test on 04/25/2018 which was low risk but did show prior inferoseptal myocardial infarction with mild peri-infarct ischemia, EF 55 to 65%.  Past medical history includes hypertension, peripheral vascular disease, history of CVA, and hyperlipidemia.  He underwent a left femoral endarterectomy and left axillobifemoral bypass by Dr. Adele Barthel on 05/30/2018.   He has had some shortness of breath since his surgery.  It is alleviated with rest.  He denies chest pain.    Review of Systems: As per "subjective", otherwise negative.  No Known Allergies  Current Outpatient Medications  Medication Sig Dispense Refill  . acetaminophen (TYLENOL ARTHRITIS PAIN) 650 MG CR tablet Take 1,300 mg by mouth 2 (two) times daily as needed for pain.     Marland Kitchen amLODipine (NORVASC) 10 MG tablet Take 10 mg by mouth every evening.     Marland Kitchen aspirin EC 81 MG tablet Take 81 mg by mouth daily.    Marland Kitchen atorvastatin (LIPITOR) 80 MG tablet Take 80 mg by mouth daily.    . clopidogrel (PLAVIX) 75 MG tablet Take 75 mg by mouth daily.     . colchicine (COLCRYS) 0.6 MG tablet Take 0.6 mg by mouth daily as needed (for gout flare ups.).     Marland Kitchen ezetimibe (ZETIA) 10 MG tablet Take 10 mg by mouth daily.    . ferrous sulfate 325 (65 FE) MG tablet Take 325 mg by mouth daily with breakfast.    . insulin lispro (HUMALOG) 100 UNIT/ML KiwkPen Inject 10 Units into the skin 2 (two) times daily before a meal.     . LANTUS SOLOSTAR 100 UNIT/ML Solostar Pen Inject 30-40 Units into the skin See admin instructions. Inject 40 units subcutaneously in the morning, and 30 units subcutaneously in the evening    . losartan-hydrochlorothiazide (HYZAAR) 100-25 MG per tablet Take 1 tablet by mouth daily.    Marland Kitchen OVER THE COUNTER MEDICATION Apply 1  application topically daily as needed (pain). Two Old Goats otc muscle rub    . oxyCODONE-acetaminophen (PERCOCET/ROXICET) 5-325 MG tablet Take 1 tablet by mouth every 6 (six) hours as needed for moderate pain. 20 tablet 0  . Oxymetazoline HCl (VICKS SINEX 12 HOUR NA) Place 1 spray into both nostrils daily as needed (congestion).     No current facility-administered medications for this visit.     Past Medical History:  Diagnosis Date  . Arthritis   . Chronic kidney disease    Stage 2  . CVA (cerebral vascular accident) ([place]) 10/2014    Late effect of cerebrovascular disease.  Mostly resolved, but can see difference in facial expressions.  tp-A, Plavix afterward  . Diabetes mellitus without complication ([place])    type II, controlled, with renal comps  . Diabetic foot ([place]) 04/05/2018   Normal sensation, skin intact  . Diverticulosis   . ED (erectile dysfunction)   . GI bleed 04/2017  . Gout   . History of claustrophobia    with MRI  . Hx of adenomatous polyp of rectum 06/10/2017  . Hypercholesterolemia   . Hypertension   . Long term (current) use of insulin ([place])    Remains on basal/bolus therapy without hypoglycemia.  . Microalbuminuria 2013  . Morbid obesity ([place])   . Obesity   . OSA (obstructive sleep apnea)  Intolerant to CPAP  . Peripheral vascular disease ([place])   . Right calf pain 04/05/2018  . Vertigo     Past Surgical History:  Procedure Laterality Date  . ABDOMINAL AORTOGRAM N/A 04/13/2018   Procedure: ABDOMINAL AORTOGRAM;  Surgeon: Conrad [place], MD;  Location: [facility];  Service: Cardiovascular;  Laterality: N/A;  . APPLICATION OF WOUND VAC Bilateral 05/30/2018   Procedure: APPLICATION OF WOUND VAC;  Surgeon: Conrad [place], MD;  Location: [facility];  Service: Vascular;  Laterality: Bilateral;  . AXILLARY-FEMORAL BYPASS GRAFT Left 05/30/2018   Procedure: BYPASS GRAFT LEFT AXILLA-BIFEMORAL WITH HEMO SHIELD GOLD VASCULAR GRAFTS;  Surgeon: Conrad Wolfe, MD;   Location: [facility];  Service: Vascular;  Laterality: Left;  . COLONOSCOPY WITH PROPOFOL N/A 06/08/2017   Procedure: COLONOSCOPY WITH PROPOFOL ( Ultra-Slim Scope);  Surgeon: Gatha Mayer, MD;  Location: [facility];  Service: Endoscopy;  Laterality: N/A;  . ENDARTERECTOMY FEMORAL Bilateral 05/30/2018   Procedure: ENDARTERECTOMY FEMORAL LEFT;  Surgeon: Conrad [place], MD;  Location: [facility];  Service: Vascular;  Laterality: Bilateral;  . INTRAOPERATIVE ARTERIOGRAM Bilateral 05/30/2018   Procedure: INTRA OPERATIVE ARTERIOGRAM BILATERAL LOWER EXTREMITIES;  Surgeon: Conrad Horace, MD;  Location: [facility];  Service: Vascular;  Laterality: Bilateral;  . LOWER EXTREMITY ANGIOGRAPHY N/A 04/13/2018   Procedure: LOWER EXTREMITY ANGIOGRAPHY;  Surgeon: Conrad , MD;  Location: [facility];  Service: Cardiovascular;  Laterality: N/A;  bilateral   . UMBILICAL HERNIA REPAIR      Social History   Socioeconomic History  . Marital status: Married    Spouse name: [name]  . Number of children: 2  . Years of education: hs gr  . Highest education level: Not on file  Occupational History  . Occupation: retired    Comment: Education administrator  . Financial resource strain: Not on file  . Food insecurity:    Worry: Not on file    Inability: Not on file  . Transportation needs:    Medical: Not on file    Non-medical: Not on file  Tobacco Use  . Smoking status: Former Smoker    Packs/day: 1.00  . Smokeless tobacco: Never Used  . Tobacco comment: Quit 20 years ago  Substance and Sexual Activity  . Alcohol use: No    Alcohol/week: 0.0 standard drinks  . Drug use: No  . Sexual activity: Not Currently    Partners: Female    Birth control/protection: None  Lifestyle  . Physical activity:    Days per week: Not on file    Minutes per session: Not on file  . Stress: Not on file  Relationships  . Social connections:    Talks on phone: Not on file    Gets together: Not on file    Attends religious  service: Not on file    Active member of club or organization: Not on file    Attends meetings of clubs or organizations: Not on file    Relationship status: Not on file  . Intimate partner violence:    Fear of current or ex partner: Not on file    Emotionally abused: Not on file    Physically abused: Not on file    Forced sexual activity: Not on file  Other Topics Concern  . Not on file  Social History Narrative   Patient is married with 2 children.   Patient is right handed.   Patient has hs education.   Patient drinks  tea on sundays, sodas occ.     Vitals:   06/28/18 0942  BP: 134/68  Pulse: 79  SpO2: 91%  Weight: 266 lb (120.7 kg)  Height: 5\' 7"  (1.702 m)    Wt Readings from Last 3 Encounters:  06/28/18 266 lb (120.7 kg)  06/19/18 267 lb (121.1 kg)  06/07/18 277 lb (125.6 kg)     PHYSICAL EXAM General: NAD HEENT: Normal. Neck: No JVD, no thyromegaly. Lungs:  Diminished throughout, no crackles or wheezes. CV: Regular rate and rhythm, normal S1/S2, no H6/D1, soft systolic murmur over right upper sternal border. No pretibial or periankle edema.     Abdomen: Soft, nontender, no distention.  Neurologic: Alert and oriented.  Psych: Normal affect. Skin: Normal. Musculoskeletal: No gross deformities.    ECG: Reviewed above under Subjective   Labs: Lab Results  Component Value Date/Time   K 3.8 05/31/2018 04:43 AM   BUN 14 05/31/2018 04:43 AM   CREATININE 1.11 05/31/2018 04:43 AM   ALT 17 05/26/2018 09:21 AM   HGB 10.3 (L) 05/31/2018 04:43 AM     Lipids: Lab Results  Component Value Date/Time   LDLCALC 91 10/26/2014 02:40 AM   CHOL 138 10/26/2014 02:40 AM   TRIG 91 10/26/2014 02:40 AM   HDL 29 (L) 10/26/2014 02:40 AM       ASSESSMENT AND PLAN: 1.  Coronary artery disease with exertional dyspnea and abnormal stress test: Overall he appears to be relatively stable from a symptomatic standpoint.  He is on aspirin, Plavix, and Lipitor.  I will start  metoprolol 25 mg twice daily.  I will provide him with a prescription for nitroglycerin.  Nuclear stress test reviewed above which was low risk.   I will order a 2-D echocardiogram with Doppler to evaluate cardiac structure, function, and regional wall motion.  2.  Hypertension: Blood pressure is normal.  I will monitor given the addition of metoprolol.  3.  Hyperlipidemia: Currently on Lipitor 80 mg and Zetia.  I will obtain a copy of lipids from PCP.  4.  History of CVA: Currently on aspirin, Plavix, and Lipitor 80 mg.  5.  Peripheral vascular disease: Followed by vascular surgery.  On aspirin, Plavix, and atorvastatin.    Disposition: Follow up 4 months   Kate Sable, M.D., F.A.C.C.

## 2018-07-06 ENCOUNTER — Other Ambulatory Visit (HOSPITAL_COMMUNITY): Payer: Medicare Other

## 2018-07-06 DIAGNOSIS — E1151 Type 2 diabetes mellitus with diabetic peripheral angiopathy without gangrene: Secondary | ICD-10-CM | POA: Diagnosis not present

## 2018-07-06 DIAGNOSIS — I70238 Atherosclerosis of native arteries of right leg with ulceration of other part of lower right leg: Secondary | ICD-10-CM | POA: Diagnosis not present

## 2018-07-06 DIAGNOSIS — Z48812 Encounter for surgical aftercare following surgery on the circulatory system: Secondary | ICD-10-CM | POA: Diagnosis not present

## 2018-07-06 DIAGNOSIS — I70223 Atherosclerosis of native arteries of extremities with rest pain, bilateral legs: Secondary | ICD-10-CM | POA: Diagnosis not present

## 2018-07-06 DIAGNOSIS — I69392 Facial weakness following cerebral infarction: Secondary | ICD-10-CM | POA: Diagnosis not present

## 2018-07-06 DIAGNOSIS — L97811 Non-pressure chronic ulcer of other part of right lower leg limited to breakdown of skin: Secondary | ICD-10-CM | POA: Diagnosis not present

## 2018-07-11 ENCOUNTER — Ambulatory Visit (HOSPITAL_COMMUNITY)
Admission: RE | Admit: 2018-07-11 | Discharge: 2018-07-11 | Disposition: A | Discharge status: Home / Self Care | Payer: Medicare Other | Source: Ambulatory Visit | Attending: Cardiovascular Disease | Admitting: Cardiovascular Disease

## 2018-07-11 DIAGNOSIS — E785 Hyperlipidemia, unspecified: Secondary | ICD-10-CM | POA: Diagnosis not present

## 2018-07-11 DIAGNOSIS — Z6841 Body Mass Index (BMI) 40.0 and over, adult: Secondary | ICD-10-CM | POA: Diagnosis not present

## 2018-07-11 DIAGNOSIS — I253 Aneurysm of heart: Secondary | ICD-10-CM | POA: Insufficient documentation

## 2018-07-11 DIAGNOSIS — R011 Cardiac murmur, unspecified: Secondary | ICD-10-CM | POA: Diagnosis not present

## 2018-07-11 DIAGNOSIS — E119 Type 2 diabetes mellitus without complications: Secondary | ICD-10-CM | POA: Diagnosis not present

## 2018-07-11 DIAGNOSIS — I119 Hypertensive heart disease without heart failure: Secondary | ICD-10-CM | POA: Insufficient documentation

## 2018-07-11 DIAGNOSIS — I071 Rheumatic tricuspid insufficiency: Secondary | ICD-10-CM | POA: Insufficient documentation

## 2018-07-11 DIAGNOSIS — G4733 Obstructive sleep apnea (adult) (pediatric): Secondary | ICD-10-CM | POA: Diagnosis not present

## 2018-07-11 NOTE — Progress Notes (Signed)
*  PRELIMINARY RESULTS* Echocardiogram 2D Echocardiogram has been performed.  Samuel Germany 07/11/2018, 2:57 PM

## 2018-07-12 DIAGNOSIS — E1129 Type 2 diabetes mellitus with other diabetic kidney complication: Secondary | ICD-10-CM | POA: Diagnosis not present

## 2018-07-12 DIAGNOSIS — Z6829 Body mass index (BMI) 29.0-29.9, adult: Secondary | ICD-10-CM | POA: Diagnosis not present

## 2018-07-12 DIAGNOSIS — I10 Essential (primary) hypertension: Secondary | ICD-10-CM | POA: Diagnosis not present

## 2018-07-12 DIAGNOSIS — I739 Peripheral vascular disease, unspecified: Secondary | ICD-10-CM | POA: Diagnosis not present

## 2018-07-12 DIAGNOSIS — R001 Bradycardia, unspecified: Secondary | ICD-10-CM | POA: Diagnosis not present

## 2018-07-12 DIAGNOSIS — Z794 Long term (current) use of insulin: Secondary | ICD-10-CM | POA: Diagnosis not present

## 2018-07-12 DIAGNOSIS — N182 Chronic kidney disease, stage 2 (mild): Secondary | ICD-10-CM | POA: Diagnosis not present

## 2018-07-12 DIAGNOSIS — Z23 Encounter for immunization: Secondary | ICD-10-CM | POA: Diagnosis not present

## 2018-07-14 ENCOUNTER — Telehealth: Payer: Self-pay | Admitting: *Deleted

## 2018-07-14 ENCOUNTER — Encounter: Payer: Self-pay | Admitting: Cardiovascular Disease

## 2018-07-14 DIAGNOSIS — Z48812 Encounter for surgical aftercare following surgery on the circulatory system: Secondary | ICD-10-CM | POA: Diagnosis not present

## 2018-07-14 DIAGNOSIS — L97811 Non-pressure chronic ulcer of other part of right lower leg limited to breakdown of skin: Secondary | ICD-10-CM | POA: Diagnosis not present

## 2018-07-14 DIAGNOSIS — I70223 Atherosclerosis of native arteries of extremities with rest pain, bilateral legs: Secondary | ICD-10-CM | POA: Diagnosis not present

## 2018-07-14 DIAGNOSIS — I70238 Atherosclerosis of native arteries of right leg with ulceration of other part of lower right leg: Secondary | ICD-10-CM | POA: Diagnosis not present

## 2018-07-14 DIAGNOSIS — E1151 Type 2 diabetes mellitus with diabetic peripheral angiopathy without gangrene: Secondary | ICD-10-CM | POA: Diagnosis not present

## 2018-07-14 DIAGNOSIS — I69392 Facial weakness following cerebral infarction: Secondary | ICD-10-CM | POA: Diagnosis not present

## 2018-07-14 MED ORDER — METOPROLOL TARTRATE 25 MG PO TABS: 12.5000 mg | ORAL_TABLET | Freq: Two times a day (BID) | ORAL | Status: DC

## 2018-07-14 NOTE — Telephone Encounter (Signed)
Herminio Commons, MD  Arnoldo Lenis, MD  Cc: Laurine Blazer, LPN        Thanks for the heads up.  Gayle, let's reduce metoprolol to 12.5 mg bid for now and have him monitor HR's.   Jamesetta So   Previous Messages    ----- Message -----  From: Arnoldo Lenis, MD  Sent: 07/11/2018  3:04 PM EDT  To: Herminio Commons, MD   Patient had echo today, heart rates low 40s during study as an FYI, asymptomatic.    Zandra Abts MD

## 2018-07-14 NOTE — Telephone Encounter (Signed)
Notified wife Vickii Chafe).  They will buy pulse ox tomorrow and start logging readings.  If numbers still are consistently staying in the 40's at this dose, she will notify the office.

## 2018-08-02 ENCOUNTER — Encounter: Payer: Self-pay | Admitting: Vascular Surgery

## 2018-08-02 ENCOUNTER — Ambulatory Visit (INDEPENDENT_AMBULATORY_CARE_PROVIDER_SITE_OTHER): Payer: Self-pay | Admitting: Vascular Surgery

## 2018-08-02 VITALS — BP 141/70 | HR 46 | Temp 97.2°F | Resp 18 | Ht 69.5 in | Wt 265.0 lb

## 2018-08-02 DIAGNOSIS — Z48812 Encounter for surgical aftercare following surgery on the circulatory system: Secondary | ICD-10-CM

## 2018-08-02 NOTE — Progress Notes (Signed)
Patient name: Jacob Avery MRN: [id number] DOB: February 23, 1946 Sex: male  REASON FOR VISIT:   Follow-up  HPI:   Jacob Avery is a pleasant 72 y.o. male Who is a patient of Dr. Aaron Edelman Chen's.  He had critical limb ischemia bilaterally and on 05/30/2018 underwent a left axillobifemoral bypass graft.  He also required a left femoral endarterectomy.  He comes in for a wound check.  Since I saw him last, he has no specific complaints.  He has been riding a bike for exercise.  The wound on his right leg has essentially healed.  Current Outpatient Medications  Medication Sig Dispense Refill  . acetaminophen (TYLENOL ARTHRITIS PAIN) 650 MG CR tablet Take 1,300 mg by mouth 2 (two) times daily as needed for pain.     Marland Kitchen amLODipine (NORVASC) 10 MG tablet Take 10 mg by mouth every evening.     Marland Kitchen aspirin EC 81 MG tablet Take 81 mg by mouth daily.    Marland Kitchen atorvastatin (LIPITOR) 80 MG tablet Take 80 mg by mouth daily.    . clopidogrel (PLAVIX) 75 MG tablet Take 75 mg by mouth daily.     Marland Kitchen ezetimibe (ZETIA) 10 MG tablet Take 10 mg by mouth daily.    . ferrous sulfate 325 (65 FE) MG tablet Take 325 mg by mouth daily with breakfast.    . insulin lispro (HUMALOG) 100 UNIT/ML KiwkPen Inject 10 Units into the skin 2 (two) times daily before a meal.     . LANTUS 100 UNIT/ML injection     . LANTUS SOLOSTAR 100 UNIT/ML Solostar Pen Inject 30-40 Units into the skin See admin instructions. Inject 40 units subcutaneously in the morning, and 30 units subcutaneously in the evening    . losartan-hydrochlorothiazide (HYZAAR) 100-25 MG per tablet Take 1 tablet by mouth daily.    . metoprolol tartrate (LOPRESSOR) 25 MG tablet Take 0.5 tablets (12.5 mg total) by mouth 2 (two) times daily.    . nitroGLYCERIN (NITROSTAT) 0.4 MG SL tablet Place 1 tablet (0.4 mg total) under the tongue every 5 (five) minutes as needed for chest pain. 25 tablet 3  . OVER THE COUNTER MEDICATION Apply 1 application topically daily as needed (pain). Two  Old Goats otc muscle rub    . Oxymetazoline HCl (VICKS SINEX 12 HOUR NA) Place 1 spray into both nostrils daily as needed (congestion).    . colchicine (COLCRYS) 0.6 MG tablet Take 0.6 mg by mouth daily as needed (for gout flare ups.).     Marland Kitchen oxyCODONE-acetaminophen (PERCOCET/ROXICET) 5-325 MG tablet Take 1 tablet by mouth every 6 (six) hours as needed for moderate pain. (Patient not taking: Reported on 08/02/2018) 20 tablet 0   No current facility-administered medications for this visit.     REVIEW OF SYSTEMS:  [X]  denotes positive finding, [ ]  denotes negative finding Vascular    Leg swelling    Cardiac    Chest pain or chest pressure:    Shortness of breath upon exertion:    Short of breath when lying flat:    Irregular heart rhythm:    Constitutional    Fever or chills:     PHYSICAL EXAM:   Vitals:   08/02/18 1024 08/02/18 1028  BP: (!) 147/68 (!) 141/70  Pulse: (!) 46 (!) 46  Resp: 18   Temp: (!) 97.2 F (36.2 C)   TempSrc: Oral   SpO2: 98%   Weight: 265 lb (120.2 kg)   Height: 5' 9.5" (1.765 m)  GENERAL: The patient is a well-nourished male, in no acute distress. The vital signs are documented above. CARDIOVASCULAR: There is a regular rate and rhythm. PULMONARY: There is good air exchange bilaterally without wheezing or rales. He is left infraclavicular incision and both groin incisions are healing well.  Both feet are warm and well-perfused. The wound on his right anterior leg at this point has healed.  DATA:   No new data  MEDICAL ISSUES:   STATUS POST LEFT AXILLOBIFEMORAL BYPASS: The patient is doing well status post a left axillobifemoral bypass graft by Dr. Adele Barthel for critical limb ischemia on 05/30/2018.  He is not a smoker.  He is on aspirin, Plavix, and a statin.  The wound on his right leg has healed.  We will get him on a protocol to follow his graft.  I have ordered ABIs and graft duplex in 6 months.  He will be seen by the nurse practitioner or PA  at that time.  Deitra Mayo [facility] [phone]

## 2018-08-02 NOTE — Progress Notes (Signed)
Vitals:   08/02/18 1024  BP: (!) 147/68  Pulse: (!) 46  Resp: 18  Temp: (!) 97.2 F (36.2 C)  TempSrc: Oral  SpO2: 98%  Weight: 265 lb (120.2 kg)  Height: 5' 9.5" (1.765 m)

## 2018-10-17 ENCOUNTER — Ambulatory Visit (INDEPENDENT_AMBULATORY_CARE_PROVIDER_SITE_OTHER): Payer: Medicare Other | Admitting: Cardiovascular Disease

## 2018-10-17 ENCOUNTER — Encounter: Payer: Self-pay | Admitting: Cardiovascular Disease

## 2018-10-17 VITALS — BP 138/72 | HR 48 | Ht 67.0 in | Wt 276.0 lb

## 2018-10-17 DIAGNOSIS — E785 Hyperlipidemia, unspecified: Secondary | ICD-10-CM | POA: Diagnosis not present

## 2018-10-17 DIAGNOSIS — Z8673 Personal history of transient ischemic attack (TIA), and cerebral infarction without residual deficits: Secondary | ICD-10-CM | POA: Diagnosis not present

## 2018-10-17 DIAGNOSIS — I10 Essential (primary) hypertension: Secondary | ICD-10-CM

## 2018-10-17 DIAGNOSIS — I25118 Atherosclerotic heart disease of native coronary artery with other forms of angina pectoris: Secondary | ICD-10-CM

## 2018-10-17 DIAGNOSIS — I739 Peripheral vascular disease, unspecified: Secondary | ICD-10-CM

## 2018-10-17 NOTE — Patient Instructions (Addendum)
Medication Instructions:   Your physician has recommended you make the following change in your medication:   Stop metoprolol  Continue all other medications the same  Labwork:  NONE  Testing/Procedures:  NONE  Follow-Up:  Your physician recommends that you schedule a follow-up appointment in: 6 months. You will receive a reminder letter in the mail in about 4 months reminding you to call and schedule your appointment. If you don't receive this letter, please contact our office.  Any Other Special Instructions Will Be Listed Below (If Applicable).  If you need a refill on your cardiac medications before your next appointment, please call your pharmacy.

## 2018-10-17 NOTE — Progress Notes (Signed)
SUBJECTIVE: The patient presents for routine follow-up.  I evaluated him for preoperative risk stratification on 04/19/2018.  He had some exertional dyspnea and an abnormal ECG.  He underwent a nuclear stress test on 04/25/2018 which was low risk but did show prior inferoseptal myocardial infarction with mild peri-infarct ischemia, EF 55 to 65%.  Past medical history includes hypertension, peripheral vascular disease, history of CVA, and hyperlipidemia.  He underwent a left femoral endarterectomy and left axillobifemoral bypass by Dr. Adele Barthel on 05/30/2018.   Echocardiogram 07/11/2018: Normal left ventricular systolic function and regional wall motion, LVEF 60 to 65%.  He denies chest pain, lightheadedness, dizziness, and leg swelling.  He has not had use nitroglycerin.  He said his shortness of breath has improved.  He is not certain if it is due to the metoprolol that I started at his last visit.     Review of Systems: As per "subjective", otherwise negative.  No Known Allergies  Current Outpatient Medications  Medication Sig Dispense Refill  . acetaminophen (TYLENOL ARTHRITIS PAIN) 650 MG CR tablet Take 1,300 mg by mouth 2 (two) times daily as needed for pain.     Marland Kitchen amLODipine (NORVASC) 10 MG tablet Take 10 mg by mouth every evening.     Marland Kitchen aspirin EC 81 MG tablet Take 81 mg by mouth daily.    Marland Kitchen atorvastatin (LIPITOR) 80 MG tablet Take 80 mg by mouth daily.    . clopidogrel (PLAVIX) 75 MG tablet Take 75 mg by mouth daily.     . colchicine (COLCRYS) 0.6 MG tablet Take 0.6 mg by mouth daily as needed (for gout flare ups.).     Marland Kitchen ezetimibe (ZETIA) 10 MG tablet Take 10 mg by mouth daily.    . ferrous sulfate 325 (65 FE) MG tablet Take 325 mg by mouth daily with breakfast.    . insulin lispro (HUMALOG) 100 UNIT/ML KiwkPen Inject 10 Units into the skin 2 (two) times daily before a meal.     . LANTUS 100 UNIT/ML injection     . LANTUS SOLOSTAR 100 UNIT/ML Solostar Pen Inject  30-40 Units into the skin See admin instructions. Inject 40 units subcutaneously in the morning, and 30 units subcutaneously in the evening    . losartan-hydrochlorothiazide (HYZAAR) 100-25 MG per tablet Take 1 tablet by mouth daily.    Marland Kitchen OVER THE COUNTER MEDICATION Apply 1 application topically daily as needed (pain). Two Old Goats otc muscle rub    . oxyCODONE-acetaminophen (PERCOCET/ROXICET) 5-325 MG tablet Take 1 tablet by mouth every 6 (six) hours as needed for moderate pain. 20 tablet 0  . Oxymetazoline HCl (VICKS SINEX 12 HOUR NA) Place 1 spray into both nostrils daily as needed (congestion).    . metoprolol tartrate (LOPRESSOR) 25 MG tablet Take 0.5 tablets (12.5 mg total) by mouth 2 (two) times daily.    . nitroGLYCERIN (NITROSTAT) 0.4 MG SL tablet Place 1 tablet (0.4 mg total) under the tongue every 5 (five) minutes as needed for chest pain. 25 tablet 3   No current facility-administered medications for this visit.     Past Medical History:  Diagnosis Date  . Arthritis   . Chronic kidney disease    Stage 2  . CVA (cerebral vascular accident) ([place]) 10/2014    Late effect of cerebrovascular disease.  Mostly resolved, but can see difference in facial expressions.  tp-A, Plavix afterward  . Diabetes mellitus without complication ([place])    type II, controlled, with  renal comps  . Diabetic foot ([place]) 04/05/2018   Normal sensation, skin intact  . Diverticulosis   . ED (erectile dysfunction)   . GI bleed 04/2017  . Gout   . History of claustrophobia    with MRI  . Hx of adenomatous polyp of rectum 06/10/2017  . Hypercholesterolemia   . Hypertension   . Long term (current) use of insulin ([place])    Remains on basal/bolus therapy without hypoglycemia.  . Microalbuminuria 2013  . Morbid obesity ([place])   . Obesity   . OSA (obstructive sleep apnea)    Intolerant to CPAP  . Peripheral vascular disease ([place])   . Right calf pain 04/05/2018  . Vertigo     Past Surgical History:    Procedure Laterality Date  . ABDOMINAL AORTOGRAM N/A 04/13/2018   Procedure: ABDOMINAL AORTOGRAM;  Surgeon: Conrad Morton, MD;  Location: [facility];  Service: Cardiovascular;  Laterality: N/A;  . APPLICATION OF WOUND VAC Bilateral 05/30/2018   Procedure: APPLICATION OF WOUND VAC;  Surgeon: Conrad [place], MD;  Location: [facility];  Service: Vascular;  Laterality: Bilateral;  . AXILLARY-FEMORAL BYPASS GRAFT Left 05/30/2018   Procedure: BYPASS GRAFT LEFT AXILLA-BIFEMORAL WITH HEMO SHIELD GOLD VASCULAR GRAFTS;  Surgeon: Conrad Herkimer, MD;  Location: [facility];  Service: Vascular;  Laterality: Left;  . COLONOSCOPY WITH PROPOFOL N/A 06/08/2017   Procedure: COLONOSCOPY WITH PROPOFOL ( Ultra-Slim Scope);  Surgeon: Gatha Mayer, MD;  Location: [facility];  Service: Endoscopy;  Laterality: N/A;  . ENDARTERECTOMY FEMORAL Bilateral 05/30/2018   Procedure: ENDARTERECTOMY FEMORAL LEFT;  Surgeon: Conrad [place], MD;  Location: [facility];  Service: Vascular;  Laterality: Bilateral;  . INTRAOPERATIVE ARTERIOGRAM Bilateral 05/30/2018   Procedure: INTRA OPERATIVE ARTERIOGRAM BILATERAL LOWER EXTREMITIES;  Surgeon: Conrad Ivor, MD;  Location: [facility];  Service: Vascular;  Laterality: Bilateral;  . LOWER EXTREMITY ANGIOGRAPHY N/A 04/13/2018   Procedure: LOWER EXTREMITY ANGIOGRAPHY;  Surgeon: Conrad Copiague, MD;  Location: [facility];  Service: Cardiovascular;  Laterality: N/A;  bilateral   . UMBILICAL HERNIA REPAIR      Social History   Socioeconomic History  . Marital status: Married    Spouse name: [name]  . Number of children: 2  . Years of education: hs gr  . Highest education level: Not on file  Occupational History  . Occupation: retired    Comment: Education administrator  . Financial resource strain: Not on file  . Food insecurity:    Worry: Not on file    Inability: Not on file  . Transportation needs:    Medical: Not on file    Non-medical: Not on file  Tobacco Use  . Smoking status:  Former Smoker    Packs/day: 1.00  . Smokeless tobacco: Never Used  . Tobacco comment: Quit 20 years ago  Substance and Sexual Activity  . Alcohol use: No    Alcohol/week: 0.0 standard drinks  . Drug use: No  . Sexual activity: Not Currently    Partners: Female    Birth control/protection: None  Lifestyle  . Physical activity:    Days per week: Not on file    Minutes per session: Not on file  . Stress: Not on file  Relationships  . Social connections:    Talks on phone: Not on file    Gets together: Not on file    Attends religious service: Not on file    Active member of club or organization:  Not on file    Attends meetings of clubs or organizations: Not on file    Relationship status: Not on file  . Intimate partner violence:    Fear of current or ex partner: Not on file    Emotionally abused: Not on file    Physically abused: Not on file    Forced sexual activity: Not on file  Other Topics Concern  . Not on file  Social History Narrative   Patient is married with 2 children.   Patient is right handed.   Patient has hs education.   Patient drinks tea on sundays, sodas occ.     Vitals:   10/17/18 1603  BP: 138/72  Pulse: (!) 48  SpO2: 96%  Weight: 276 lb (125.2 kg)  Height: 5\' 7"  (1.702 m)    Wt Readings from Last 3 Encounters:  10/17/18 276 lb (125.2 kg)  08/02/18 265 lb (120.2 kg)  06/28/18 266 lb (120.7 kg)     PHYSICAL EXAM General: NAD HEENT: Normal. Neck: No JVD, no thyromegaly. Lungs: Diminished throughout, no crackles or wheezes CV: Bradycardic, regular rhythm, normal S1/S2, no A5/W9, soft systolic murmur over right upper sternal border. No pretibial or periankle edema.     Abdomen: Soft, nontender, no distention.  Neurologic: Alert and oriented.  Psych: Normal affect. Skin: Normal. Musculoskeletal: No gross deformities.    ECG: Reviewed above under Subjective   Labs: Lab Results  Component Value Date/Time   K 3.8 05/31/2018 04:43 AM     BUN 14 05/31/2018 04:43 AM   CREATININE 1.11 05/31/2018 04:43 AM   ALT 17 05/26/2018 09:21 AM   HGB 10.3 (L) 05/31/2018 04:43 AM     Lipids: Lab Results  Component Value Date/Time   LDLCALC 91 10/26/2014 02:40 AM   CHOL 138 10/26/2014 02:40 AM   TRIG 91 10/26/2014 02:40 AM   HDL 29 (L) 10/26/2014 02:40 AM       ASSESSMENT AND PLAN: 1.  Coronary artery disease: Exertional dyspnea has improved.  He had an abnormal stress test. He is on aspirin, Plavix, metoprolol, and Lipitor.  He is bradycardic so I will stop metoprolol.  Nuclear stress test reviewed above which was low risk.   Echocardiogram demonstrated normal left ventricular systolic function and regional wall motion.  2.  Hypertension: Blood pressure is normal.    He is bradycardic so I will stop metoprolol  3.  Hyperlipidemia: Currently on Lipitor 80 mg and Zetia.  I will obtain a copy of lipids from PCP.  4.  History of CVA: Currently on aspirin, Plavix, and Lipitor 80 mg.  5.  Peripheral vascular disease: Followed by vascular surgery.  On aspirin, Plavix, and atorvastatin.    Disposition: Follow up 6 months   Kate Sable, M.D., F.A.C.C.

## 2018-12-12 DIAGNOSIS — R82998 Other abnormal findings in urine: Secondary | ICD-10-CM | POA: Diagnosis not present

## 2018-12-19 DIAGNOSIS — N401 Enlarged prostate with lower urinary tract symptoms: Secondary | ICD-10-CM | POA: Insufficient documentation

## 2018-12-19 DIAGNOSIS — I1310 Hypertensive heart and chronic kidney disease without heart failure, with stage 1 through stage 4 chronic kidney disease, or unspecified chronic kidney disease: Secondary | ICD-10-CM | POA: Insufficient documentation

## 2019-02-06 ENCOUNTER — Other Ambulatory Visit: Payer: Self-pay

## 2019-02-06 DIAGNOSIS — I998 Other disorder of circulatory system: Secondary | ICD-10-CM

## 2019-02-06 DIAGNOSIS — I70229 Atherosclerosis of native arteries of extremities with rest pain, unspecified extremity: Secondary | ICD-10-CM

## 2019-02-06 DIAGNOSIS — I739 Peripheral vascular disease, unspecified: Secondary | ICD-10-CM

## 2019-02-20 ENCOUNTER — Encounter (HOSPITAL_COMMUNITY): Payer: Medicare Other

## 2019-02-20 ENCOUNTER — Ambulatory Visit: Payer: Medicare Other | Admitting: Family

## 2019-03-23 ENCOUNTER — Ambulatory Visit (INDEPENDENT_AMBULATORY_CARE_PROVIDER_SITE_OTHER): Payer: Medicare Other | Admitting: Podiatry

## 2019-03-23 ENCOUNTER — Encounter: Payer: Self-pay | Admitting: Podiatry

## 2019-03-23 VITALS — Temp 97.3°F

## 2019-03-23 DIAGNOSIS — E1151 Type 2 diabetes mellitus with diabetic peripheral angiopathy without gangrene: Secondary | ICD-10-CM | POA: Diagnosis not present

## 2019-03-23 DIAGNOSIS — M79674 Pain in right toe(s): Secondary | ICD-10-CM | POA: Diagnosis not present

## 2019-03-23 DIAGNOSIS — M2042 Other hammer toe(s) (acquired), left foot: Secondary | ICD-10-CM

## 2019-03-23 DIAGNOSIS — M2041 Other hammer toe(s) (acquired), right foot: Secondary | ICD-10-CM

## 2019-03-23 DIAGNOSIS — B351 Tinea unguium: Secondary | ICD-10-CM | POA: Diagnosis not present

## 2019-03-23 DIAGNOSIS — M2011 Hallux valgus (acquired), right foot: Secondary | ICD-10-CM

## 2019-03-23 DIAGNOSIS — M2012 Hallux valgus (acquired), left foot: Secondary | ICD-10-CM | POA: Diagnosis not present

## 2019-03-23 DIAGNOSIS — M79675 Pain in left toe(s): Secondary | ICD-10-CM

## 2019-03-23 NOTE — Patient Instructions (Signed)
Diabetes Mellitus and Foot Care  Foot care is an important part of your health, especially when you have diabetes. Diabetes may cause you to have problems because of poor blood flow (circulation) to your feet and legs, which can cause your skin to:   Become thinner and drier.   Break more easily.   Heal more slowly.   Peel and crack.  You may also have nerve damage (neuropathy) in your legs and feet, causing decreased feeling in them. This means that you may not notice minor injuries to your feet that could lead to more serious problems. Noticing and addressing any potential problems early is the best way to prevent future foot problems.  How to care for your feet  Foot hygiene   Wash your feet daily with warm water and mild soap. Do not use hot water. Then, pat your feet and the areas between your toes until they are completely dry. Do not soak your feet as this can dry your skin.   Trim your toenails straight across. Do not dig under them or around the cuticle. File the edges of your nails with an emery board or nail file.   Apply a moisturizing lotion or petroleum jelly to the skin on your feet and to dry, brittle toenails. Use lotion that does not contain alcohol and is unscented. Do not apply lotion between your toes.  Shoes and socks   Wear clean socks or stockings every day. Make sure they are not too tight. Do not wear knee-high stockings since they may decrease blood flow to your legs.   Wear shoes that fit properly and have enough cushioning. Always look in your shoes before you put them on to be sure there are no objects inside.   To break in new shoes, wear them for just a few hours a day. This prevents injuries on your feet.  Wounds, scrapes, corns, and calluses   Check your feet daily for blisters, cuts, bruises, sores, and redness. If you cannot see the bottom of your feet, use a mirror or ask someone for help.   Do not cut corns or calluses or try to remove them with medicine.   If you  find a minor scrape, cut, or break in the skin on your feet, keep it and the skin around it clean and dry. You may clean these areas with mild soap and water. Do not clean the area with peroxide, alcohol, or iodine.   If you have a wound, scrape, corn, or callus on your foot, look at it several times a day to make sure it is healing and not infected. Check for:  ? Redness, swelling, or pain.  ? Fluid or blood.  ? Warmth.  ? Pus or a bad smell.  General instructions   Do not cross your legs. This may decrease blood flow to your feet.   Do not use heating pads or hot water bottles on your feet. They may burn your skin. If you have lost feeling in your feet or legs, you may not know this is happening until it is too late.   Protect your feet from hot and cold by wearing shoes, such as at the beach or on hot pavement.   Schedule a complete foot exam at least once a year (annually) or more often if you have foot problems. If you have foot problems, report any cuts, sores, or bruises to your health care provider immediately.  Contact a health care provider if:     You have a medical condition that increases your risk of infection and you have any cuts, sores, or bruises on your feet.   You have an injury that is not healing.   You have redness on your legs or feet.   You feel burning or tingling in your legs or feet.   You have pain or cramps in your legs and feet.   Your legs or feet are numb.   Your feet always feel cold.   You have pain around a toenail.  Get help right away if:   You have a wound, scrape, corn, or callus on your foot and:  ? You have pain, swelling, or redness that gets worse.  ? You have fluid or blood coming from the wound, scrape, corn, or callus.  ? Your wound, scrape, corn, or callus feels warm to the touch.  ? You have pus or a bad smell coming from the wound, scrape, corn, or callus.  ? You have a fever.  ? You have a red line going up your leg.  Summary   Check your feet every day  for cuts, sores, red spots, swelling, and blisters.   Moisturize feet and legs daily.   Wear shoes that fit properly and have enough cushioning.   If you have foot problems, report any cuts, sores, or bruises to your health care provider immediately.   Schedule a complete foot exam at least once a year (annually) or more often if you have foot problems.  This information is not intended to replace advice given to you by your health care provider. Make sure you discuss any questions you have with your health care provider.  Document Released: 10/15/2000 Document Revised: 11/30/2017 Document Reviewed: 11/19/2016  Elsevier Interactive Patient Education  2019 Elsevier Inc.

## 2019-04-01 ENCOUNTER — Encounter: Payer: Self-pay | Admitting: Podiatry

## 2019-04-01 NOTE — Progress Notes (Signed)
Subjective: Jacob Avery presents today referred by [name], MDfor diabetic foot examination. He presents with cc of painful, discolored, thick toenails which interfere with daily activities.  Pain is aggravated when wearing enclosed shoe gear.   He has been diabetic for 15 years. He denies any history of foot wounds.  He denies numbness, tingling, and burning of his feet. Patient states he did have skin graft surgery on his legs.  Per review of record he does have history of critical lower limb ischemia and has had bypass intervention on his left lower extremity.  He is also on blood thinner Plavix.  Past Medical History:  Diagnosis Date  . Arthritis   . Chronic kidney disease    Stage 2  . CVA (cerebral vascular accident) ([place]) 10/2014    Late effect of cerebrovascular disease.  Mostly resolved, but can see difference in facial expressions.  tp-A, Plavix afterward  . Diabetes mellitus without complication ([place])    type II, controlled, with renal comps  . Diabetic foot ([place]) 04/05/2018   Normal sensation, skin intact  . Diverticulosis   . ED (erectile dysfunction)   . GI bleed 04/2017  . Gout   . History of claustrophobia    with MRI  . Hx of adenomatous polyp of rectum 06/10/2017  . Hypercholesterolemia   . Hypertension   . Long term (current) use of insulin ([place])    Remains on basal/bolus therapy without hypoglycemia.  . Microalbuminuria 2013  . Morbid obesity ([place])   . Obesity   . OSA (obstructive sleep apnea)    Intolerant to CPAP  . Peripheral vascular disease ([place])   . Right calf pain 04/05/2018  . Vertigo      Patient Active Problem List   Diagnosis Date Noted  . PAD (peripheral artery disease) ([place]) 05/30/2018  . Critical lower limb ischemia 04/10/2018  . Hx of adenomatous polyp of rectum 06/10/2017  . HLD (hyperlipidemia) 01/07/2015  . Obstructive sleep apnea 01/07/2015  . Sleep apnea 12/11/2014  . Snoring 12/11/2014  . Severe obesity (BMI >= 40)  ([place]) 12/11/2014  . Embolic stroke involving right carotid artery ([place]) 12/11/2014  . Palpitations 10/29/2014  . Carotid stenosis   . Essential hypertension 10/28/2014  . Hyperlipidemia LDL goal <70 10/28/2014  . Morbid obesity ([place]) 10/28/2014  . Diabetes ([place]) 10/28/2014  . likely OSA (obstructive sleep apnea) 10/28/2014  . Cerebral infarction due to occlusion or stenosis of precerebral artery ([place]) 10/25/2014  . OTHER SPECIFIED INTESTINAL OBSTRUCTION 12/22/2009  . DIVERTICULOSIS-COLON 12/22/2009     Past Surgical History:  Procedure Laterality Date  . ABDOMINAL AORTOGRAM N/A 04/13/2018   Procedure: ABDOMINAL AORTOGRAM;  Surgeon: Conrad Wyndmoor, MD;  Location: [facility];  Service: Cardiovascular;  Laterality: N/A;  . APPLICATION OF WOUND VAC Bilateral 05/30/2018   Procedure: APPLICATION OF WOUND VAC;  Surgeon: Conrad [place]town, MD;  Location: [facility];  Service: Vascular;  Laterality: Bilateral;  . AXILLARY-FEMORAL BYPASS GRAFT Left 05/30/2018   Procedure: BYPASS GRAFT LEFT AXILLA-BIFEMORAL WITH HEMO SHIELD GOLD VASCULAR GRAFTS;  Surgeon: Conrad Huron, MD;  Location: [facility];  Service: Vascular;  Laterality: Left;  . COLONOSCOPY WITH PROPOFOL N/A 06/08/2017   Procedure: COLONOSCOPY WITH PROPOFOL ( Ultra-Slim Scope);  Surgeon: Gatha Mayer, MD;  Location: [facility];  Service: Endoscopy;  Laterality: N/A;  . ENDARTERECTOMY FEMORAL Bilateral 05/30/2018   Procedure: ENDARTERECTOMY FEMORAL LEFT;  Surgeon: Conrad [place], MD;  Location: [facility];  Service: Vascular;  Laterality:  Bilateral;  . INTRAOPERATIVE ARTERIOGRAM Bilateral 05/30/2018   Procedure: INTRA OPERATIVE ARTERIOGRAM BILATERAL LOWER EXTREMITIES;  Surgeon: Conrad [place] Vander[place]ift, MD;  Location: [facility];  Service: Vascular;  Laterality: Bilateral;  . LOWER EXTREMITY ANGIOGRAPHY N/A 04/13/2018   Procedure: LOWER EXTREMITY ANGIOGRAPHY;  Surgeon: Conrad Venice, MD;  Location: [facility];  Service: Cardiovascular;  Laterality: N/A;   bilateral   . UMBILICAL HERNIA REPAIR        Current Outpatient Medications:  .  Accu-Chek FastClix Lancets MISC, , Disp: , Rfl:  .  ACCU-CHEK GUIDE test strip, USE STRIP TO CHECK GLUCOSE THREE TIMES DAILY, Disp: , Rfl:  .  acetaminophen (TYLENOL ARTHRITIS PAIN) 650 MG CR tablet, Take 1,300 mg by mouth 2 (two) times daily as needed for pain. , Disp: , Rfl:  .  amLODipine (NORVASC) 10 MG tablet, Take 10 mg by mouth every evening. , Disp: , Rfl:  .  aspirin EC 81 MG tablet, Take 81 mg by mouth daily., Disp: , Rfl:  .  atorvastatin (LIPITOR) 80 MG tablet, Take 80 mg by mouth daily., Disp: , Rfl:  .  clopidogrel (PLAVIX) 75 MG tablet, Take 75 mg by mouth daily. , Disp: , Rfl:  .  colchicine (COLCRYS) 0.6 MG tablet, Take 0.6 mg by mouth daily as needed (for gout flare ups.). , Disp: , Rfl:  .  ezetimibe (ZETIA) 10 MG tablet, Take 10 mg by mouth daily., Disp: , Rfl:  .  ferrous sulfate 325 (65 FE) MG tablet, Take 325 mg by mouth daily with breakfast., Disp: , Rfl:  .  insulin lispro (HUMALOG) 100 UNIT/ML KiwkPen, Inject 10 Units into the skin 2 (two) times daily before a meal. , Disp: , Rfl:  .  LANTUS 100 UNIT/ML injection, , Disp: , Rfl:  .  LANTUS SOLOSTAR 100 UNIT/ML Solostar Pen, Inject 30-40 Units into the skin See admin instructions. Inject 40 units subcutaneously in the morning, and 30 units subcutaneously in the evening, Disp: , Rfl:  .  losartan-hydrochlorothiazide (HYZAAR) 100-25 MG per tablet, Take 1 tablet by mouth daily., Disp: , Rfl:  .  OVER THE COUNTER MEDICATION, Apply 1 application topically daily as needed (pain). Two Old Goats otc muscle rub, Disp: , Rfl:  .  oxyCODONE-acetaminophen (PERCOCET/ROXICET) 5-325 MG tablet, Take 1 tablet by mouth every 6 (six) hours as needed for moderate pain., Disp: 20 tablet, Rfl: 0 .  Oxymetazoline HCl (VICKS SINEX 12 HOUR NA), Place 1 spray into both nostrils daily as needed (congestion)., Disp: , Rfl:  .  nitroGLYCERIN (NITROSTAT) 0.4 MG SL  tablet, Place 1 tablet (0.4 mg total) under the tongue every 5 (five) minutes as needed for chest pain., Disp: 25 tablet, Rfl: 3   No Known Allergies   Social History   Occupational History  . Occupation: retired    Comment: Adjuster  Tobacco Use  . Smoking status: Former Smoker    Packs/day: 1.00  . Smokeless tobacco: Never Used  . Tobacco comment: Quit 20 years ago  Substance and Sexual Activity  . Alcohol use: No    Alcohol/week: 0.0 standard drinks  . Drug use: No  . Sexual activity: Not Currently    Partners: Female    Birth control/protection: None     Family History  Problem Relation Age of Onset  . CVA Mother   . Alzheimer's disease Mother   . Heart attack Father   . Heart disease Father   . Diabetes Brother   . Rectal  cancer Brother   . Stomach cancer Neg Hx   . Colon cancer Neg Hx       There is no immunization history on file for this patient.   Review of systems: Positive Findings in bold print.  Constitutional:  chills, fatigue, fever, sweats, weight change Communication: Optometrist, sign Ecologist, hand writing, iPad/Android device Head: headaches, head injury Eyes: changes in vision, eye pain, glaucoma, cataracts, macular degeneration, diplopia, glare,  light sensitivity, eyeglasses or contacts, blindness Ears nose mouth throat: hearing impaired, hearing aids,  ringing in ears, deaf, sign language,  vertigo,   nosebleeds,  rhinitis,  cold sores, snoring, swollen glands Cardiovascular: HTN, edema, arrhythmia, pacemaker in place, defibrillator in place, chest pain/tightness, chronic anticoagulation, blood clot, heart failure, MI Peripheral Vascular: leg cramps, varicose veins, blood clots, lymphedema, varicosities Respiratory:  difficulty breathing, denies congestion, SOB, wheezing, cough, emphysema Gastrointestinal: change in appetite or weight, abdominal pain, constipation, diarrhea, nausea, vomiting, vomiting blood, change in bowel habits,  abdominal pain, jaundice, rectal bleeding, hemorrhoids, GERD Genitourinary:  nocturia,  pain on urination, polyuria,  blood in urine, Foley catheter, urinary urgency, ESRD on hemodialysis Musculoskeletal: amputation, cramping, stiff joints, painful joints, decreased joint motion, fractures, OA, gout, hemiplegia, paraplegia, uses cane, wheelchair bound, uses walker, uses rollator Skin: +changes in toenails, color change, dryness, itching, mole changes,  rash, wound(s) Neurological: headaches, numbness in feet, paresthesias in feet, burning in feet, fainting,  seizures, change in speech. denies headaches, memory problems/poor historian, cerebral palsy, weakness, paralysis, CVA, TIA Endocrine: diabetes, hypothyroidism, hyperthyroidism,  goiter, dry mouth, flushing, heat intolerance,  cold intolerance,  excessive thirst, denies polyuria,  nocturia Hematological:  easy bleeding, excessive bleeding, easy bruising, enlarged lymph nodes, on long term blood thinner, history of past transusions Allergy/immunological:  hives, eczema, frequent infections, multiple drug allergies, seasonal allergies, transplant recipient, multiple food allergies Psychiatric:  anxiety, depression, mood disorder, suicidal ideations, hallucinations, insomnia  Objective: Vitals:   03/23/19 1151  Temp: (!) 97.3 F (36.3 C)    Vascular Examination: Capillary refill time immediate x 10 digits.  Dorsalis pedis pulses nonpalpable bilaterally.  Posterior tibial pulses nonpalpable bilaterally.  Sparse digital hair x 10 digits.  Skin temperature gradient WNL b/l  Dermatological Examination: Skin with normal turgor, texture and tone b/l.  Toenails 1-5 b/l discolored, thick, dystrophic with subungual debris and pain with palpation to nailbeds due to thickness of nails.  Musculoskeletal: Muscle strength 5/5 to all LE muscle groups.  Hallux abductovalgus with bunion deformity bilaterally.  Hammertoe deformity digits 2  through 5 bilaterally.  There is evidence of DJD of the dorsal midfoot bilaterally.  Neurological: Sensation intact with 10 gram monofilament.  Vibratory sensation intact.  Assessment: 1. Painful onychomycosis toenails 1-5 b/l  2. NIDDM with PAD  Plan: 1. Discussed diabetic foot care principles and onychomycosis and treatment options.  Literature dispensed on today. 2. Toenails 1-5 b/l were debrided in length and girth without iatrogenic bleeding. 3. Patient to continue soft, supportive shoe gear daily. 4. Patient to report any pedal injuries to medical professional immediately. 5. Follow up 3 months.  6. Patient/POA to call should there be a concern in the interim.

## 2019-04-10 ENCOUNTER — Ambulatory Visit: Payer: PRIVATE HEALTH INSURANCE | Admitting: Podiatry

## 2019-04-11 ENCOUNTER — Ambulatory Visit (INDEPENDENT_AMBULATORY_CARE_PROVIDER_SITE_OTHER): Payer: Medicare Other

## 2019-04-11 ENCOUNTER — Ambulatory Visit
Admission: EM | Admit: 2019-04-11 | Discharge: 2019-04-11 | Disposition: A | Discharge status: Home / Self Care | Payer: Medicare Other | Attending: Emergency Medicine | Admitting: Emergency Medicine

## 2019-04-11 DIAGNOSIS — M1812 Unilateral primary osteoarthritis of first carpometacarpal joint, left hand: Secondary | ICD-10-CM | POA: Diagnosis not present

## 2019-04-11 DIAGNOSIS — M79642 Pain in left hand: Secondary | ICD-10-CM | POA: Diagnosis not present

## 2019-04-11 DIAGNOSIS — S61412A Laceration without foreign body of left hand, initial encounter: Secondary | ICD-10-CM | POA: Diagnosis not present

## 2019-04-11 DIAGNOSIS — W268XXA Contact with other sharp object(s), not elsewhere classified, initial encounter: Secondary | ICD-10-CM | POA: Diagnosis not present

## 2019-04-11 DIAGNOSIS — Z23 Encounter for immunization: Secondary | ICD-10-CM | POA: Diagnosis not present

## 2019-04-11 DIAGNOSIS — S61012A Laceration without foreign body of left thumb without damage to nail, initial encounter: Secondary | ICD-10-CM | POA: Diagnosis not present

## 2019-04-11 MED ORDER — TETANUS-DIPHTH-ACELL PERTUSSIS 5-2.5-18.5 LF-MCG/0.5 IM SUSP
0.5000 mL | Freq: Once | INTRAMUSCULAR | Status: AC
  Administered 2019-04-11: 0.5 mL via INTRAMUSCULAR

## 2019-04-11 NOTE — ED Triage Notes (Signed)
Pt was trimming hedges and cut left hand with branch, bleeding is controlled

## 2019-04-11 NOTE — Discharge Instructions (Signed)
X-rays did not show fracture, dislocation or retained foreign body Tetanus updated 10 sutures applied Bandage applied Keep covered for next and dry for next 24-48 hours.  After then you may gently clean with warm water and mild soap.  Avoid submerging wound in water. Change dressing daily and apply a thin layer of neosporin.  Return in 10 days to have sutures removed.   Take OTC ibuprofen or tylenol as needed for pain relief Return sooner or go to the ED if you have any new or worsening symptoms such as increased pain, redness, swelling, drainage, discharge, decreased range of motion of extremity, etc..

## 2019-04-11 NOTE — ED Provider Notes (Addendum)
[facility]   [id number] 04/11/19 Arrival Time: 1559  CC: LACERATION  SUBJECTIVE:  Jacob Avery is a 73 y.o. male who presents with a laceration to left hand that occurred approximately 3 - 4 hours ago.  Symptoms began after breaking a rotting tree limb with his hands.  Bleeding controlled.  Currently on blood thinners.  Denies similar symptoms in the past.  Denies fever, chills, nausea, vomiting, redness, swelling, purulent drainage, decrease strength or sensation.   Td UTD: Unknown.  ROS: As per HPI.  Past Medical History:  Diagnosis Date   Arthritis    Chronic kidney disease    Stage 2   CVA (cerebral vascular accident) ([place]) 10/2014    Late effect of cerebrovascular disease.  Mostly resolved, but can see difference in facial expressions.  tp-A, Plavix afterward   Diabetes mellitus without complication ([place])    type II, controlled, with renal comps   Diabetic foot ([place]) 04/05/2018   Normal sensation, skin intact   Diverticulosis    ED (erectile dysfunction)    GI bleed 04/2017   Gout    History of claustrophobia    with MRI   Hx of adenomatous polyp of rectum 06/10/2017   Hypercholesterolemia    Hypertension    Long term (current) use of insulin ([place])    Remains on basal/bolus therapy without hypoglycemia.   Microalbuminuria 2013   Morbid obesity ([place])    Obesity    OSA (obstructive sleep apnea)    Intolerant to CPAP   Peripheral vascular disease ([place])    Right calf pain 04/05/2018   Vertigo    Past Surgical History:  Procedure Laterality Date   ABDOMINAL AORTOGRAM N/A 04/13/2018   Procedure: ABDOMINAL AORTOGRAM;  Surgeon: Conrad Riverton, MD;  Location: [facility];  Service: Cardiovascular;  Laterality: N/A;   APPLICATION OF WOUND VAC Bilateral 05/30/2018   Procedure: APPLICATION OF WOUND VAC;  Surgeon: Conrad Darbydale, MD;  Location: [facility];  Service: Vascular;  Laterality: Bilateral;   AXILLARY-FEMORAL BYPASS GRAFT Left  05/30/2018   Procedure: BYPASS GRAFT LEFT AXILLA-BIFEMORAL WITH HEMO SHIELD GOLD VASCULAR GRAFTS;  Surgeon: Conrad Healy, MD;  Location: [facility];  Service: Vascular;  Laterality: Left;   COLONOSCOPY WITH PROPOFOL N/A 06/08/2017   Procedure: COLONOSCOPY WITH PROPOFOL ( Ultra-Slim Scope);  Surgeon: Gatha Mayer, MD;  Location: [facility];  Service: Endoscopy;  Laterality: N/A;   ENDARTERECTOMY FEMORAL Bilateral 05/30/2018   Procedure: ENDARTERECTOMY FEMORAL LEFT;  Surgeon: Conrad [place] Lauderdale, MD;  Location: [facility];  Service: Vascular;  Laterality: Bilateral;   INTRAOPERATIVE ARTERIOGRAM Bilateral 05/30/2018   Procedure: INTRA OPERATIVE ARTERIOGRAM BILATERAL LOWER EXTREMITIES;  Surgeon: Conrad Metzger, MD;  Location: [facility];  Service: Vascular;  Laterality: Bilateral;   LOWER EXTREMITY ANGIOGRAPHY N/A 04/13/2018   Procedure: LOWER EXTREMITY ANGIOGRAPHY;  Surgeon: Conrad Monroe, MD;  Location: [facility];  Service: Cardiovascular;  Laterality: N/A;  bilateral    UMBILICAL HERNIA REPAIR     No Known Allergies No current facility-administered medications on file prior to encounter.    Current Outpatient Medications on File Prior to Encounter  Medication Sig Dispense Refill   atorvastatin (LIPITOR) 80 MG tablet Take 80 mg by mouth daily.     clopidogrel (PLAVIX) 75 MG tablet Take 75 mg by mouth daily.      Accu-Chek FastClix Lancets MISC      ACCU-CHEK GUIDE test strip USE STRIP TO CHECK GLUCOSE THREE TIMES DAILY  acetaminophen (TYLENOL ARTHRITIS PAIN) 650 MG CR tablet Take 1,300 mg by mouth 2 (two) times daily as needed for pain.      amLODipine (NORVASC) 10 MG tablet Take 10 mg by mouth every evening.      aspirin EC 81 MG tablet Take 81 mg by mouth daily.     colchicine (COLCRYS) 0.6 MG tablet Take 0.6 mg by mouth daily as needed (for gout flare ups.).      ezetimibe (ZETIA) 10 MG tablet Take 10 mg by mouth daily.     ferrous sulfate 325 (65 FE) MG tablet Take 325 mg by mouth  daily with breakfast.     insulin lispro (HUMALOG) 100 UNIT/ML KiwkPen Inject 10 Units into the skin 2 (two) times daily before a meal.      LANTUS 100 UNIT/ML injection      LANTUS SOLOSTAR 100 UNIT/ML Solostar Pen Inject 30-40 Units into the skin See admin instructions. Inject 40 units subcutaneously in the morning, and 30 units subcutaneously in the evening     losartan-hydrochlorothiazide (HYZAAR) 100-25 MG per tablet Take 1 tablet by mouth daily.     nitroGLYCERIN (NITROSTAT) 0.4 MG SL tablet Place 1 tablet (0.4 mg total) under the tongue every 5 (five) minutes as needed for chest pain. 25 tablet 3   OVER THE COUNTER MEDICATION Apply 1 application topically daily as needed (pain). Two Old Goats otc muscle rub     oxyCODONE-acetaminophen (PERCOCET/ROXICET) 5-325 MG tablet Take 1 tablet by mouth every 6 (six) hours as needed for moderate pain. 20 tablet 0   Oxymetazoline HCl (VICKS SINEX 12 HOUR NA) Place 1 spray into both nostrils daily as needed (congestion).     Social History   Socioeconomic History   Marital status: Married    Spouse name: [name]   Number of children: 2   Years of education: hs gr   Highest education level: Not on file  Occupational History   Occupation: retired    Comment: Dance movement psychotherapist strain: Not on file   Food insecurity:    Worry: Not on file    Inability: Not on file   Transportation needs:    Medical: Not on file    Non-medical: Not on file  Tobacco Use   Smoking status: Former Smoker    Packs/day: 1.00   Smokeless tobacco: Never Used   Tobacco comment: Quit 20 years ago  Substance and Sexual Activity   Alcohol use: No    Alcohol/week: 0.0 standard drinks   Drug use: No   Sexual activity: Not Currently    Partners: Female    Birth control/protection: None  Lifestyle   Physical activity:    Days per week: Not on file    Minutes per session: Not on file   Stress: Not on file    Relationships   Social connections:    Talks on phone: Not on file    Gets together: Not on file    Attends religious service: Not on file    Active member of club or organization: Not on file    Attends meetings of clubs or organizations: Not on file    Relationship status: Not on file   Intimate partner violence:    Fear of current or ex partner: Not on file    Emotionally abused: Not on file    Physically abused: Not on file    Forced sexual activity: Not on file  Other Topics Concern  Not on file  Social History Narrative   Patient is married with 2 children.   Patient is right handed.   Patient has hs education.   Patient drinks tea on sundays, sodas occ.   Family History  Problem Relation Age of Onset   CVA Mother    Alzheimer's disease Mother    Heart attack Father    Heart disease Father    Diabetes Brother    Rectal cancer Brother    Stomach cancer Neg Hx    Colon cancer Neg Hx      OBJECTIVE:  Vitals:   04/11/19 1606  BP: (!) 171/83  Pulse: (!) 58  Resp: 18  Temp: 97.7 F (36.5 C)  SpO2: 95%     General appearance: alert; no distress CV: Radial pulse 2+; cap refill < 2 seconds Skin: laceration of left thenar eminence of hand in V shape; size: approx 3-4 cm; bleeding controlled; strength and sensation intact about the thumb; strength and sensation intact following suture placement (see pictures below) Psychological: alert and cooperative; normal mood and affect         DIAGNOSTIC STUDIES:  Dg Hand Complete Left  Result Date: 04/11/2019 CLINICAL DATA:  Left hand laceration after injury. EXAM: LEFT HAND - COMPLETE 3+ VIEW COMPARISON:  None. FINDINGS: There is no evidence of fracture or dislocation. Severe narrowing of first carpometacarpal joint is noted. Soft tissues are unremarkable. IMPRESSION: Severe osteoarthritis of first carpometacarpal joint. No acute abnormality seen in the left hand. Electronically Signed   By: Marijo Conception M.D.   On: 04/11/2019 16:31     Procedure: Verbal consent obtained. Patient provided with risks and alternatives to the procedure. Wound copiously irrigated with tap water, 250 ml sterile water, and 250 ml NS then cleansed with betadine. Anesthetized with 7 mL of lidocaine without epinephrine.  Wound carefully explored. No foreign body, tendon injury, or nonviable tissue were noted. Using sterile technique 9 interrupted, and 1 modified horizontal 5-0 Prolene sutures placed to reapproximate the wound. Patient tolerated procedure well. No complications. Minimal bleeding. Patient advised to look for and return for any signs of infection such as redness, swelling, discharge, or worsening pain. Return for suture removal in 10 days.  ASSESSMENT & PLAN:  1. Laceration of left palm, initial encounter     Meds ordered this encounter  Medications   Tdap (BOOSTRIX) injection 0.5 mL   X-rays did not show fracture, dislocation or retained foreign body Tetanus updated 10 sutures applied Bandage applied Keep covered for next and dry for next 24-48 hours.  After then you may gently clean with warm water and mild soap.  Avoid submerging wound in water. Change dressing daily and apply a thin layer of neosporin.  Return in 10 days to have sutures removed.   Take OTC ibuprofen or tylenol as needed for pain relief Return sooner or go to the ED if you have any new or worsening symptoms such as increased pain, redness, swelling, drainage, discharge, decreased range of motion of extremity, etc..    Reviewed expectations re: course of current medical issues. Questions answered. Outlined signs and symptoms indicating need for more acute intervention. Patient verbalized understanding. After Visit Summary given.   Lestine Box, PA-C 04/11/19 [place], [place], PA-C 04/11/19 1755

## 2019-04-23 ENCOUNTER — Ambulatory Visit
Admission: EM | Admit: 2019-04-23 | Discharge: 2019-04-23 | Disposition: A | Discharge status: Home / Self Care | Payer: Medicare Other

## 2019-04-23 ENCOUNTER — Other Ambulatory Visit: Payer: Self-pay

## 2019-04-23 DIAGNOSIS — Z5189 Encounter for other specified aftercare: Secondary | ICD-10-CM

## 2019-04-23 DIAGNOSIS — S61012D Laceration without foreign body of left thumb without damage to nail, subsequent encounter: Secondary | ICD-10-CM | POA: Diagnosis not present

## 2019-04-23 DIAGNOSIS — Z4802 Encounter for removal of sutures: Secondary | ICD-10-CM | POA: Diagnosis not present

## 2019-04-23 NOTE — ED Notes (Signed)
10 stiches removed, steri strip applied and pt encouraged to keep wound dry , advised to follow up if not improving

## 2019-04-23 NOTE — ED Provider Notes (Signed)
[facility]   [id number] 04/23/19 Arrival Time: 1004  Abbreviated visit  CC: Suture removal  SUBJECTIVE:  Jacob Avery is a 73 y.o. male hx significant for vascular disease and DM, who presents follow up suture removal.  Had laceration to left hand 12 days ago.  Does admit to getting hand wet and moving thumb.  Denies fever, chills, nausea, vomiting, redness, swelling, purulent drainage, decrease strength or sensation.   ROS: As per HPI.  Past Medical History:  Diagnosis Date  . Arthritis   . Chronic kidney disease    Stage 2  . CVA (cerebral vascular accident) ([place]) 10/2014    Late effect of cerebrovascular disease.  Mostly resolved, but can see difference in facial expressions.  tp-A, Plavix afterward  . Diabetes mellitus without complication ([place])    type II, controlled, with renal comps  . Diabetic foot ([place]) 04/05/2018   Normal sensation, skin intact  . Diverticulosis   . ED (erectile dysfunction)   . GI bleed 04/2017  . Gout   . History of claustrophobia    with MRI  . Hx of adenomatous polyp of rectum 06/10/2017  . Hypercholesterolemia   . Hypertension   . Long term (current) use of insulin ([place])    Remains on basal/bolus therapy without hypoglycemia.  . Microalbuminuria 2013  . Morbid obesity ([place])   . Obesity   . OSA (obstructive sleep apnea)    Intolerant to CPAP  . Peripheral vascular disease ([place])   . Right calf pain 04/05/2018  . Vertigo    Past Surgical History:  Procedure Laterality Date  . ABDOMINAL AORTOGRAM N/A 04/13/2018   Procedure: ABDOMINAL AORTOGRAM;  Surgeon: Conrad [place] Mountain, MD;  Location: [facility];  Service: Cardiovascular;  Laterality: N/A;  . APPLICATION OF WOUND VAC Bilateral 05/30/2018   Procedure: APPLICATION OF WOUND VAC;  Surgeon: Conrad Eminence, MD;  Location: [facility];  Service: Vascular;  Laterality: Bilateral;  . AXILLARY-FEMORAL BYPASS GRAFT Left 05/30/2018   Procedure: BYPASS GRAFT LEFT AXILLA-BIFEMORAL WITH HEMO  SHIELD GOLD VASCULAR GRAFTS;  Surgeon: Conrad Coldwater, MD;  Location: [facility];  Service: Vascular;  Laterality: Left;  . COLONOSCOPY WITH PROPOFOL N/A 06/08/2017   Procedure: COLONOSCOPY WITH PROPOFOL ( Ultra-Slim Scope);  Surgeon: Gatha Mayer, MD;  Location: [facility];  Service: Endoscopy;  Laterality: N/A;  . ENDARTERECTOMY FEMORAL Bilateral 05/30/2018   Procedure: ENDARTERECTOMY FEMORAL LEFT;  Surgeon: Conrad Oronoco, MD;  Location: [facility];  Service: Vascular;  Laterality: Bilateral;  . INTRAOPERATIVE ARTERIOGRAM Bilateral 05/30/2018   Procedure: INTRA OPERATIVE ARTERIOGRAM BILATERAL LOWER EXTREMITIES;  Surgeon: Conrad Rutledge, MD;  Location: [facility];  Service: Vascular;  Laterality: Bilateral;  . LOWER EXTREMITY ANGIOGRAPHY N/A 04/13/2018   Procedure: LOWER EXTREMITY ANGIOGRAPHY;  Surgeon: Conrad , MD;  Location: [facility];  Service: Cardiovascular;  Laterality: N/A;  bilateral   . UMBILICAL HERNIA REPAIR     No Known Allergies No current facility-administered medications on file prior to encounter.    Current Outpatient Medications on File Prior to Encounter  Medication Sig Dispense Refill  . Accu-Chek FastClix Lancets MISC     . ACCU-CHEK GUIDE test strip USE STRIP TO CHECK GLUCOSE THREE TIMES DAILY    . acetaminophen (TYLENOL ARTHRITIS PAIN) 650 MG CR tablet Take 1,300 mg by mouth 2 (two) times daily as needed for pain.     Marland Kitchen amLODipine (NORVASC) 10 MG tablet Take 10 mg by mouth every evening.     Marland Kitchen  aspirin EC 81 MG tablet Take 81 mg by mouth daily.    Marland Kitchen atorvastatin (LIPITOR) 80 MG tablet Take 80 mg by mouth daily.    . clopidogrel (PLAVIX) 75 MG tablet Take 75 mg by mouth daily.     . colchicine (COLCRYS) 0.6 MG tablet Take 0.6 mg by mouth daily as needed (for gout flare ups.).     Marland Kitchen ezetimibe (ZETIA) 10 MG tablet Take 10 mg by mouth daily.    . ferrous sulfate 325 (65 FE) MG tablet Take 325 mg by mouth daily with breakfast.    . insulin lispro (HUMALOG) 100 UNIT/ML  KiwkPen Inject 10 Units into the skin 2 (two) times daily before a meal.     . LANTUS 100 UNIT/ML injection     . LANTUS SOLOSTAR 100 UNIT/ML Solostar Pen Inject 30-40 Units into the skin See admin instructions. Inject 40 units subcutaneously in the morning, and 30 units subcutaneously in the evening    . losartan-hydrochlorothiazide (HYZAAR) 100-25 MG per tablet Take 1 tablet by mouth daily.    . nitroGLYCERIN (NITROSTAT) 0.4 MG SL tablet Place 1 tablet (0.4 mg total) under the tongue every 5 (five) minutes as needed for chest pain. 25 tablet 3  . OVER THE COUNTER MEDICATION Apply 1 application topically daily as needed (pain). Two Old Goats otc muscle rub    . oxyCODONE-acetaminophen (PERCOCET/ROXICET) 5-325 MG tablet Take 1 tablet by mouth every 6 (six) hours as needed for moderate pain. 20 tablet 0  . Oxymetazoline HCl (VICKS SINEX 12 HOUR NA) Place 1 spray into both nostrils daily as needed (congestion).     Social History   Socioeconomic History  . Marital status: Married    Spouse name: [name]  . Number of children: 2  . Years of education: hs gr  . Highest education level: Not on file  Occupational History  . Occupation: retired    Comment: Education administrator  . Financial resource strain: Not on file  . Food insecurity    Worry: Not on file    Inability: Not on file  . Transportation needs    Medical: Not on file    Non-medical: Not on file  Tobacco Use  . Smoking status: Former Smoker    Packs/day: 1.00  . Smokeless tobacco: Never Used  . Tobacco comment: Quit 20 years ago  Substance and Sexual Activity  . Alcohol use: No    Alcohol/week: 0.0 standard drinks  . Drug use: No  . Sexual activity: Not Currently    Partners: Female    Birth control/protection: None  Lifestyle  . Physical activity    Days per week: Not on file    Minutes per session: Not on file  . Stress: Not on file  Relationships  . Social Herbalist on phone: Not on file    Gets  together: Not on file    Attends religious service: Not on file    Active member of club or organization: Not on file    Attends meetings of clubs or organizations: Not on file    Relationship status: Not on file  . Intimate partner violence    Fear of current or ex partner: Not on file    Emotionally abused: Not on file    Physically abused: Not on file    Forced sexual activity: Not on file  Other Topics Concern  . Not on file  Social History Narrative   Patient is married  with 2 children.   Patient is right handed.   Patient has hs education.   Patient drinks tea on sundays, sodas occ.   Family History  Problem Relation Age of Onset  . CVA Mother   . Alzheimer's disease Mother   . Heart attack Father   . Heart disease Father   . Diabetes Brother   . Rectal cancer Brother   . Stomach cancer Neg Hx   . Colon cancer Neg Hx      OBJECTIVE:  There were no vitals filed for this visit.   General appearance: alert; no distress Skin: laceration interdigit webspace of left thumb; partial healing, no drainage or active bleeding (see picture below) Psychological: alert and cooperative; normal mood and affect      ASSESSMENT & PLAN:  1. Visit for suture removal   2. Visit for wound check    Discussed patient case with Dr. Mannie Stabile.  He recommended suture removal and to keep the hand dry.  Recheck in 3-4 days Sutures removed, steri-strips placed Patient will follow up in 3-4 days for recheck.  If no improvement or worsening symptoms may consider referral to wound management.  Patient and his wife are in agreement with this plan.   Instructed patient to return sooner or go to the ED if you have any new or worsening symptoms such as increased pain, redness, swelling, drainage, discharge, decreased range of motion of extremity, etc..    Reviewed expectations re: course of current medical issues. Questions answered. Outlined signs and symptoms indicating need for more acute  intervention. Patient verbalized understanding. After Visit Summary given.   Lestine Box, PA-C 04/23/19 1048

## 2019-04-23 NOTE — Discharge Instructions (Addendum)
Discussed patient case with Dr. Mannie Stabile.  He recommended suture removal and to keep the hand dry.  Recheck in 3-4 days Sutures removed, steri-strips placed Patient will follow up in 3-4 days for recheck.  If no improvement or worsening symptoms may consider referral to wound management.  Patient and his wife are in agreement with this plan.   Instructed patient to return sooner or go to the ED if you have any new or worsening symptoms such as increased pain, redness, swelling, drainage, discharge, decreased range of motion of extremity, etc..

## 2019-04-23 NOTE — ED Triage Notes (Signed)
Pt here for suture removal, 12 days post laceration, skin not approximated , provider made aware.

## 2019-04-24 ENCOUNTER — Ambulatory Visit: Payer: Medicare Other | Admitting: Physician Assistant

## 2019-05-09 ENCOUNTER — Telehealth: Payer: Self-pay | Admitting: Cardiovascular Disease

## 2019-05-09 NOTE — Telephone Encounter (Signed)
Virtual Visit Pre-Appointment Phone Call  "(Name), I am calling you today to discuss your upcoming appointment. We are currently trying to limit exposure to the virus that causes COVID-19 by seeing patients at home rather than in the office."  1. "What is the BEST phone number to call the day of the visit?" - include this in appointment notes  2. Do you have or have access to (through a family member/friend) a smartphone with video capability that we can use for your visit?" a. If yes - list this number in appt notes as cell (if different from BEST phone #) and list the appointment type as a VIDEO visit in appointment notes b. If no - list the appointment type as a PHONE visit in appointment notes  3. Confirm consent - "In the setting of the current Covid19 crisis, you are scheduled for a (phone or video) visit with your provider on (date) at (time).  Just as we do with many in-office visits, in order for you to participate in this visit, we must obtain consent.  If you'd like, I can send this to your mychart (if signed up) or email for you to review.  Otherwise, I can obtain your verbal consent now.  All virtual visits are billed to your insurance company just like a normal visit would be.  By agreeing to a virtual visit, we'd like you to understand that the technology does not allow for your provider to perform an examination, and thus may limit your provider's ability to fully assess your condition. If your provider identifies any concerns that need to be evaluated in person, we will make arrangements to do so.  Finally, though the technology is pretty good, we cannot assure that it will always work on either your or our end, and in the setting of a video visit, we may have to convert it to a phone-only visit.  In either situation, we cannot ensure that we have a secure connection.  Are you willing to proceed?" STAFF: Did the patient verbally acknowledge consent to telehealth visit? Document  YES/NO here: Yes  4. Advise patient to be prepared - "Two hours prior to your appointment, go ahead and check your blood pressure, pulse, oxygen saturation, and your weight (if you have the equipment to check those) and write them all down. When your visit starts, your provider will ask you for this information. If you have an Apple Watch or Kardia device, please plan to have heart rate information ready on the day of your appointment. Please have a pen and paper handy nearby the day of the visit as well."  5. Give patient instructions for MyChart download to smartphone OR [email or web address] as below if video visit (depending on what platform provider is using)  6. Inform patient they will receive a phone call 15 minutes prior to their appointment time (may be from unknown caller ID) so they should be prepared to answer    TELEPHONE CALL NOTE  Jacob Avery has been deemed a candidate for a follow-up tele-health visit to limit community exposure during the Covid-19 pandemic. I spoke with the patient via phone to ensure availability of phone/video source, confirm preferred email & phone number, and discuss instructions and expectations.  I reminded Jacob Avery to be prepared with any vital sign and/or heart rhythm information that could potentially be obtained via home monitoring, at the time of his visit. I reminded Jacob Avery to expect a phone call prior to his visit.  Orinda Kenner 05/09/2019 12:54 PM

## 2019-05-11 ENCOUNTER — Encounter: Payer: Self-pay | Admitting: Cardiovascular Disease

## 2019-05-11 ENCOUNTER — Other Ambulatory Visit: Payer: Self-pay

## 2019-05-11 ENCOUNTER — Telehealth (INDEPENDENT_AMBULATORY_CARE_PROVIDER_SITE_OTHER): Payer: Medicare Other | Admitting: Cardiovascular Disease

## 2019-05-11 VITALS — Wt 263.0 lb

## 2019-05-11 DIAGNOSIS — I25118 Atherosclerotic heart disease of native coronary artery with other forms of angina pectoris: Secondary | ICD-10-CM | POA: Diagnosis not present

## 2019-05-11 DIAGNOSIS — I10 Essential (primary) hypertension: Secondary | ICD-10-CM

## 2019-05-11 DIAGNOSIS — Z8673 Personal history of transient ischemic attack (TIA), and cerebral infarction without residual deficits: Secondary | ICD-10-CM

## 2019-05-11 DIAGNOSIS — E785 Hyperlipidemia, unspecified: Secondary | ICD-10-CM

## 2019-05-11 DIAGNOSIS — I739 Peripheral vascular disease, unspecified: Secondary | ICD-10-CM

## 2019-05-11 NOTE — Progress Notes (Signed)
Virtual Visit via Telephone Note   This visit type was conducted due to national recommendations for restrictions regarding the COVID-19 Pandemic (e.g. social distancing) in an effort to limit this patient's exposure and mitigate transmission in our community.  Due to his co-morbid illnesses, this patient is at least at moderate risk for complications without adequate follow up.  This format is felt to be most appropriate for this patient at this time.  The patient did not have access to video technology/had technical difficulties with video requiring transitioning to audio format only (telephone).  All issues noted in this document were discussed and addressed.  No physical exam could be performed with this format.  Please refer to the patient's chart for his  consent to telehealth for Jacob Avery.   Date:  05/11/2019   ID:  Jacob Avery, DOB 1946/02/11, MRN [id number]  Patient Location: Home Provider Location: Home  PCP:  [name], Fransico Him, MD  Cardiologist:  Kate Sable, MD  Electrophysiologist:  None   Evaluation Performed:  Follow-Up Visit  Chief Complaint:  CAD  History of Present Illness:    Jacob Avery is a 72 y.o. male with hypertension,presumed CAD, peripheral vascular disease,history of CVA, and hyperlipidemia.  I evaluated him for preoperative risk stratification on 04/19/2018. He had some exertional dyspnea and an abnormal ECG.  He underwent a nuclear stress test on 04/25/2018 which was low risk but did show prior inferoseptal myocardial infarction with mild peri-infarct ischemia, EF 55 to 65%.  He underwent a left femoral endarterectomy and left axillobifemoral bypass by Dr. Adele Barthel on 05/30/2018.  He denies chest pain, palpitations, and leg swelling. He denies any significant shortness of breath.  The patient does not have symptoms concerning for COVID-19 infection (fever, chills, cough, or new shortness of breath).    Past Medical History:   Diagnosis Date  . Arthritis   . Chronic kidney disease    Stage 2  . CVA (cerebral vascular accident) ([place]) 10/2014    Late effect of cerebrovascular disease.  Mostly resolved, but can see difference in facial expressions.  tp-A, Plavix afterward  . Diabetes mellitus without complication ([place])    type II, controlled, with renal comps  . Diabetic foot ([place]) 04/05/2018   Normal sensation, skin intact  . Diverticulosis   . ED (erectile dysfunction)   . GI bleed 04/2017  . Gout   . History of claustrophobia    with MRI  . Hx of adenomatous polyp of rectum 06/10/2017  . Hypercholesterolemia   . Hypertension   . Long term (current) use of insulin ([place])    Remains on basal/bolus therapy without hypoglycemia.  . Microalbuminuria 2013  . Morbid obesity ([place])   . Obesity   . OSA (obstructive sleep apnea)    Intolerant to CPAP  . Peripheral vascular disease ([place])   . Right calf pain 04/05/2018  . Vertigo    Past Surgical History:  Procedure Laterality Date  . ABDOMINAL AORTOGRAM N/A 04/13/2018   Procedure: ABDOMINAL AORTOGRAM;  Surgeon: Conrad [place], MD;  Location: [facility];  Service: Cardiovascular;  Laterality: N/A;  . APPLICATION OF WOUND VAC Bilateral 05/30/2018   Procedure: APPLICATION OF WOUND VAC;  Surgeon: Conrad Portales, MD;  Location: [facility];  Service: Vascular;  Laterality: Bilateral;  . AXILLARY-FEMORAL BYPASS GRAFT Left 05/30/2018   Procedure: BYPASS GRAFT LEFT AXILLA-BIFEMORAL WITH HEMO SHIELD GOLD VASCULAR GRAFTS;  Surgeon: Conrad Whittier, MD;  Location: [facility];  Service: Vascular;  Laterality: Left;  . COLONOSCOPY WITH PROPOFOL N/A 06/08/2017   Procedure: COLONOSCOPY WITH PROPOFOL ( Ultra-Slim Scope);  Surgeon: Gatha Mayer, MD;  Location: [facility];  Service: Endoscopy;  Laterality: N/A;  . ENDARTERECTOMY FEMORAL Bilateral 05/30/2018   Procedure: ENDARTERECTOMY FEMORAL LEFT;  Surgeon: Conrad Robie Creek, MD;  Location: [facility];  Service: Vascular;  Laterality:  Bilateral;  . INTRAOPERATIVE ARTERIOGRAM Bilateral 05/30/2018   Procedure: INTRA OPERATIVE ARTERIOGRAM BILATERAL LOWER EXTREMITIES;  Surgeon: Conrad Kittanning, MD;  Location: [facility];  Service: Vascular;  Laterality: Bilateral;  . LOWER EXTREMITY ANGIOGRAPHY N/A 04/13/2018   Procedure: LOWER EXTREMITY ANGIOGRAPHY;  Surgeon: Conrad M[place] Lake, MD;  Location: [facility];  Service: Cardiovascular;  Laterality: N/A;  bilateral   . UMBILICAL HERNIA REPAIR       Current Meds  Medication Sig  . Accu-Chek FastClix Lancets MISC   . ACCU-CHEK GUIDE test strip USE STRIP TO CHECK GLUCOSE THREE TIMES DAILY  . acetaminophen (TYLENOL ARTHRITIS PAIN) 650 MG CR tablet Take 1,300 mg by mouth 2 (two) times daily as needed for pain.   Marland Kitchen amLODipine (NORVASC) 10 MG tablet Take 10 mg by mouth every evening.   Marland Kitchen aspirin EC 81 MG tablet Take 81 mg by mouth daily.  Marland Kitchen atorvastatin (LIPITOR) 80 MG tablet Take 80 mg by mouth daily.  . clopidogrel (PLAVIX) 75 MG tablet Take 75 mg by mouth daily.   . colchicine (COLCRYS) 0.6 MG tablet Take 0.6 mg by mouth daily as needed (for gout flare ups.).   Marland Kitchen ezetimibe (ZETIA) 10 MG tablet Take 10 mg by mouth daily.  . ferrous sulfate 325 (65 FE) MG tablet Take 325 mg by mouth daily with breakfast.  . insulin lispro (HUMALOG) 100 UNIT/ML KiwkPen Inject 10 Units into the skin 2 (two) times daily before a meal.   . LANTUS SOLOSTAR 100 UNIT/ML Solostar Pen Inject 30-40 Units into the skin See admin instructions. Inject 40 units subcutaneously in the morning, and 30 units subcutaneously in the evening  . losartan-hydrochlorothiazide (HYZAAR) 100-25 MG per tablet Take 1 tablet by mouth daily.  . nitroGLYCERIN (NITROSTAT) 0.4 MG SL tablet Place 1 tablet (0.4 mg total) under the tongue every 5 (five) minutes as needed for chest pain.  Marland Kitchen OVER THE COUNTER MEDICATION Apply 1 application topically daily as needed (pain). Two [place] otc muscle rub  . oxyCODONE-acetaminophen (PERCOCET/ROXICET)  5-325 MG tablet Take 1 tablet by mouth every 6 (six) hours as needed for moderate pain.  Marland Kitchen Oxymetazoline HCl (VICKS SINEX 12 HOUR NA) Place 1 spray into both nostrils daily as needed (congestion).     Allergies:   Patient has no known allergies.   Social History   Tobacco Use  . Smoking status: Former Smoker    Packs/day: 1.00  . Smokeless tobacco: Never Used  . Tobacco comment: Quit 20 years ago  Substance Use Topics  . Alcohol use: No    Alcohol/week: 0.0 standard drinks  . Drug use: No     Family Hx: The patient's family history includes Alzheimer's disease in his mother; CVA in his mother; Diabetes in his brother; Heart attack in his father; Heart disease in his father; Rectal cancer in his brother. There is no history of Stomach cancer or Colon cancer.  ROS:   Please see the history of present illness.     All other systems reviewed and are negative.   Prior CV studies:   The following studies were reviewed today:  See  above  Labs/Other Tests and Data Reviewed:    EKG:  No ECG reviewed.  Recent Labs: 05/26/2018: ALT 17 05/31/2018: BUN 14; Creatinine, Ser 1.11; Hemoglobin 10.3; Platelets 242; Potassium 3.8; Sodium 140   Recent Lipid Panel Lab Results  Component Value Date/Time   CHOL 138 10/26/2014 02:40 AM   TRIG 91 10/26/2014 02:40 AM   HDL 29 (L) 10/26/2014 02:40 AM   CHOLHDL 4.8 10/26/2014 02:40 AM   LDLCALC 91 10/26/2014 02:40 AM    Wt Readings from Last 3 Encounters:  05/11/19 263 lb (119.3 kg)  10/17/18 276 lb (125.2 kg)  08/02/18 265 lb (120.2 kg)     Objective:    Vital Signs:  Wt 263 lb (119.3 kg)   BMI 41.19 kg/m    VITAL SIGNS:  reviewed  ASSESSMENT & PLAN:    1. Coronary artery disease: Symptomatically stable.  He had an abnormal stress test in June 2019. He is on aspirin, Plavix, and Lipitor. I previously stopped metoprolol due to bradycardia. Nuclear stress test reviewed above which was low risk.  Echocardiogram demonstrated  normal left ventricular systolic function and regional wall motion.  2. Hypertension: No BP cuff at home. No changes.  3. Hyperlipidemia: Currently on Lipitor 80 mg and Zetia. I will obtain a copy of lipids from PCP.  4. History of CVA: Currently on aspirin, Plavix, and Lipitor 80 mg.  5. Peripheral vascular disease: Followed by vascular surgery. On aspirin, Plavix, and atorvastatin.   COVID-19 Education: The signs and symptoms of COVID-19 were discussed with the patient and how to seek care for testing (follow up with PCP or arrange E-visit).  The importance of social distancing was discussed today.  Time:   Today, I have spent 5 minutes with the patient with telehealth technology discussing the above problems.     Medication Adjustments/Labs and Tests Ordered: Current medicines are reviewed at length with the patient today.  Concerns regarding medicines are outlined above.   Tests Ordered: No orders of the defined types were placed in this encounter.   Medication Changes: No orders of the defined types were placed in this encounter.   Follow Up:  Virtual Visit or In Person in 6 month(s)  Signed, Kate Sable, MD  05/11/2019 10:44 AM    [place]

## 2019-05-11 NOTE — Patient Instructions (Signed)

## 2019-05-25 ENCOUNTER — Telehealth (HOSPITAL_COMMUNITY): Payer: Self-pay

## 2019-05-25 NOTE — Telephone Encounter (Signed)
The above patient or their representative was contacted and gave the following answers to these questions:         Do you have any of the following symptoms? NO Fever                    Cough                   Shortness of breath  Do  you have any of the following other symptoms?    muscle pain         vomiting,        diarrhea        rash         weakness        red eye        abdominal pain         bruising          bruising or bleeding              joint pain           severe headache    Have you been in contact with someone who was or has been sick in the past 2 weeks? NO Yes                 Unsure                         Unable to assess   Does the person that you were in contact with have any of the following symptoms?   Cough         shortness of breath           muscle pain         vomiting,            diarrhea            rash            weakness           fever            red eye           abdominal pain           bruising  or  bleeding                joint pain                severe headache               Have you  or someone you have been in contact with traveled internationally in th last month?        NO If yes, which countries?   Have you  or someone you have been in contact with traveled outside [place] in th last month?         If yes, which state and city?   COMMENTS OR ACTION PLAN FOR THIS PATIENT:

## 2019-05-28 ENCOUNTER — Ambulatory Visit (HOSPITAL_COMMUNITY)
Admission: RE | Admit: 2019-05-28 | Discharge: 2019-05-28 | Disposition: A | Discharge status: Home / Self Care | Payer: Medicare Other | Source: Ambulatory Visit | Attending: Vascular Surgery | Admitting: Vascular Surgery

## 2019-05-28 ENCOUNTER — Telehealth: Payer: Self-pay | Admitting: *Deleted

## 2019-05-28 ENCOUNTER — Other Ambulatory Visit: Payer: Self-pay

## 2019-05-28 ENCOUNTER — Ambulatory Visit (INDEPENDENT_AMBULATORY_CARE_PROVIDER_SITE_OTHER)
Admission: RE | Admit: 2019-05-28 | Discharge: 2019-05-28 | Disposition: A | Discharge status: Home / Self Care | Payer: Medicare Other | Source: Ambulatory Visit | Attending: Vascular Surgery | Admitting: Vascular Surgery

## 2019-05-28 DIAGNOSIS — I739 Peripheral vascular disease, unspecified: Secondary | ICD-10-CM | POA: Diagnosis not present

## 2019-05-28 DIAGNOSIS — I70229 Atherosclerosis of native arteries of extremities with rest pain, unspecified extremity: Secondary | ICD-10-CM

## 2019-05-28 DIAGNOSIS — I998 Other disorder of circulatory system: Secondary | ICD-10-CM

## 2019-05-28 NOTE — Telephone Encounter (Signed)
Virtual Visit Pre-Appointment Phone Call  Today, I spoke with Jacob Avery and performed the following actions:  1. I explained that we are currently trying to limit exposure to the COVID-19 virus by seeing patients at home rather than in the office.  I explained that the visits are best done by video, but can be done by telephone.  I asked the patient if a virtual visit that the patient would like to try instead of coming into the office. Jacob Avery agreed to proceed with the phone visit scheduled with Jacob Avery NPon 06/01/19.     I confirmed the BEST phone number to call the day of the visit and- I included this in appointment notes.  2. I asked if the patient had access to (through a family member/friend) a smartphone with video capability to be used for his visit?"  The patient said yes -       3. I confirmed consent by  a. sending through [place] or by email the [place] as written at the end of this message or  b. verbally as listed below. i. This visit is being performed in the setting of COVID-19. ii. All virtual visits are billed to your insurance company just like a normal visit would be.   iii. We'd like you to understand that the technology does not allow for your provider to perform an examination, and thus may limit your provider's ability to fully assess your condition.  iv. If your provider identifies any concerns that need to be evaluated in person, we will make arrangements to do so.   v. Finally, though the technology is pretty good, we cannot assure that it will always work on either your or our end, and in the setting of a video visit, we may have to convert it to a phone-only visit.  In either situation, we cannot ensure that we have a secure connection.   vi. Are you willing to proceed?"  STAFF: Did the patient verbally acknowledge consent to telehealth visit? Document YES/NO here: YES  2. I advised the patient to be prepared  - I asked that the patient, on the day of his visit, record any information possible with the equipment at his home, such as blood pressure, pulse, oxygen saturation, and your weight and write them all down. I asked the patient to have a pen and paper handy nearby the day of the visit as well.  3. If the patient was scheduled for a video visit, I informed the patient that the visit with the doctor would start with a text to the smartphone # given to [place] by the patient.         If the patient was scheduled for a telephone call, I informed the patient that the visit with the doctor would start with a call to the telephone # given to [place] by the patient.  4. I Informed patient they will receive a phone call 15 minutes prior to their appointment time from a [place] or nurse to review medications, allergies, etc. to prepare for the visit.    TELEPHONE CALL NOTE  Jacob Avery has been deemed a candidate for a follow-up tele-health visit to limit community exposure during the Covid-19 pandemic. I spoke with the patient via phone to ensure availability of phone/video source, confirm preferred email & phone number, and discuss instructions and expectations.  I reminded Jacob Avery to be prepared with any vital sign and/or heart rhythm  information that could potentially be obtained via home monitoring, at the time of his visit. I reminded Jacob Avery to expect a phone call prior to his visit.  Jacob Avery, NT 05/28/2019 12:38 PM     FULL LENGTH CONSENT FOR TELE-HEALTH VISIT   I hereby voluntarily request, consent and authorize [facility] and its employed or contracted physicians, physician assistants, nurse practitioners or other licensed health care professionals (the Practitioner), to provide me with telemedicine health care services (the "Services") as deemed necessary by the treating Practitioner. I acknowledge and consent to receive the Services by the Practitioner via telemedicine. I understand  that the telemedicine visit will involve communicating with the Practitioner through live audiovisual communication technology and the disclosure of certain medical information by electronic transmission. I acknowledge that I have been given the opportunity to request an in-person assessment or other available alternative prior to the telemedicine visit and am voluntarily participating in the telemedicine visit.  I understand that I have the right to withhold or withdraw my consent to the use of telemedicine in the course of my care at any time, without affecting my right to future care or treatment, and that the Practitioner or I may terminate the telemedicine visit at any time. I understand that I have the right to inspect all information obtained and/or recorded in the course of the telemedicine visit and may receive copies of available information for a reasonable fee.  I understand that some of the potential risks of receiving the Services via telemedicine include:  Marland Kitchen Delay or interruption in medical evaluation due to technological equipment failure or disruption; . Information transmitted may not be sufficient (e.g. poor resolution of images) to allow for appropriate medical decision making by the Practitioner; and/or  . In rare instances, security protocols could fail, causing a breach of personal health information.  Furthermore, I acknowledge that it is my responsibility to provide information about my medical history, conditions and care that is complete and accurate to the best of my ability. I acknowledge that Practitioner's advice, recommendations, and/or decision may be based on factors not within their control, such as incomplete or inaccurate data provided by me or distortions of diagnostic images or specimens that may result from electronic transmissions. I understand that the practice of medicine is not an exact science and that Practitioner makes no warranties or guarantees regarding  treatment outcomes. I acknowledge that I will receive a copy of this consent concurrently upon execution via email to the email address I last provided but may also request a printed copy by calling the office of [facility].    I understand that my insurance will be billed for this visit.   I have read or had this consent read to me. . I understand the contents of this consent, which adequately explains the benefits and risks of the Services being provided via telemedicine.  . I have been provided ample opportunity to ask questions regarding this consent and the Services and have had my questions answered to my satisfaction. . I give my informed consent for the services to be provided through the use of telemedicine in my medical care  By participating in this telemedicine visit I agree to the above.

## 2019-06-01 ENCOUNTER — Other Ambulatory Visit: Payer: Self-pay

## 2019-06-01 ENCOUNTER — Ambulatory Visit (INDEPENDENT_AMBULATORY_CARE_PROVIDER_SITE_OTHER): Payer: Medicare Other | Admitting: Family

## 2019-06-01 ENCOUNTER — Encounter: Payer: Self-pay | Admitting: Family

## 2019-06-01 VITALS — Wt 263.0 lb

## 2019-06-01 DIAGNOSIS — I779 Disorder of arteries and arterioles, unspecified: Secondary | ICD-10-CM | POA: Diagnosis not present

## 2019-06-01 DIAGNOSIS — Z87891 Personal history of nicotine dependence: Secondary | ICD-10-CM

## 2019-06-01 NOTE — Patient Instructions (Signed)
Peripheral Vascular Disease  Peripheral vascular disease (PVD) is a disease of the blood vessels that are not part of your heart and brain. A simple term for PVD is poor circulation. In most cases, PVD narrows the blood vessels that carry blood from your heart to the rest of your body. This can reduce the supply of blood to your arms, legs, and internal organs, like your stomach or kidneys. However, PVD most often affects a person's lower legs and feet. Without treatment, PVD tends to get worse. PVD can also lead to acute ischemic limb. This is when an arm or leg suddenly cannot get enough blood. This is a medical emergency. Follow these instructions at home: Lifestyle  Do not use any products that contain nicotine or tobacco, such as cigarettes and e-cigarettes. If you need help quitting, ask your doctor.  Lose weight if you are overweight. Or, stay at a healthy weight as told by your doctor.  Eat a diet that is low in fat and cholesterol. If you need help, ask your doctor.  Exercise regularly. Ask your doctor for activities that are right for you. General instructions  Take over-the-counter and prescription medicines only as told by your doctor.  Take good care of your feet: ? Wear comfortable shoes that fit well. ? Check your feet often for any cuts or sores.  Keep all follow-up visits as told by your doctor This is important. Contact a doctor if:  You have cramps in your legs when you walk.  You have leg pain when you are at rest.  You have coldness in a leg or foot.  Your skin changes.  You are unable to get or have an erection (erectile dysfunction).  You have cuts or sores on your feet that do not heal. Get help right away if:  Your arm or leg turns cold, numb, and blue.  Your arms or legs become red, warm, swollen, painful, or numb.  You have chest pain.  You have trouble breathing.  You suddenly have weakness in your face, arm, or leg.  You become very  confused or you cannot speak.  You suddenly have a very bad headache.  You suddenly cannot see. Summary  Peripheral vascular disease (PVD) is a disease of the blood vessels.  A simple term for PVD is poor circulation. Without treatment, PVD tends to get worse.  Treatment may include exercise, low fat and low cholesterol diet, and quitting smoking. This information is not intended to replace advice given to you by your health care provider. Make sure you discuss any questions you have with your health care provider. Document Released: 01/12/2010 Document Revised: 09/30/2017 Document Reviewed: 11/25/2016 Elsevier Patient Education  2020 [employer]  

## 2019-06-01 NOTE — Progress Notes (Signed)
Virtual Visit via Telephone Note   I connected with Jacob Avery on 06/01/2019 using the [employer] by telephone and verified that I was speaking with the correct person using two identifiers. Patient was located at his home and accompanied by himself. I am located at the [facility] office/clinic.   The limitations of evaluation and management by telemedicine and the availability of in person appointments have been previously discussed with the patient and are documented in the patients chart. The patient expressed understanding and consented to proceed.  PCP: Jacob Pao, MD  Chief Complaint: follow up peripheral artery occlusive disease   History of Present Illness: Jacob Avery is a 73 y.o. male who is s/p left axillobifemoral bypass graft and left femoral endarterectomy on 05-30-18 by Dr. Bridgett Avery for critical limb ischemia bilaterally.  Jacob Avery last evaluated pt on 08-02-18. At that time the patient was doing well.The wound on his right leg had healed. Pt was to follow up with ABIs and graft duplex in 6 months and be seen by the nurse practitioner or PA.  He states his legs feel much improved since the last revascularization procedure, his walking has improved. He can walk to his mailbox and back. He trims his many hedges. He denies any non healing wounds.   He is a former smoker. He has DM, normal serum creatinine as of 05-31-18  He takes a daily statin, ASA, and Plavix.    Past Medical History:  Diagnosis Date  . Arthritis   . Chronic kidney disease    Stage 2  . CVA (cerebral vascular accident) ([place]) 10/2014    Late effect of cerebrovascular disease.  Mostly resolved, but can see difference in facial expressions.  tp-A, Plavix afterward  . Diabetes mellitus without complication ([place])    type II, controlled, with renal comps  . Diabetic foot ([place]) 04/05/2018   Normal sensation, skin intact  . Diverticulosis   . ED (erectile dysfunction)   . GI bleed 04/2017  . Gout   .  History of claustrophobia    with MRI  . Hx of adenomatous polyp of rectum 06/10/2017  . Hypercholesterolemia   . Hypertension   . Long term (current) use of insulin ([place])    Remains on basal/bolus therapy without hypoglycemia.  . Microalbuminuria 2013  . Morbid obesity ([place])   . Obesity   . OSA (obstructive sleep apnea)    Intolerant to CPAP  . Peripheral vascular disease ([place])   . Right calf pain 04/05/2018  . Vertigo     Past Surgical History:  Procedure Laterality Date  . ABDOMINAL AORTOGRAM N/A 04/13/2018   Procedure: ABDOMINAL AORTOGRAM;  Surgeon: Jacob Nye, MD;  Location: [facility];  Service: Cardiovascular;  Laterality: N/A;  . APPLICATION OF WOUND VAC Bilateral 05/30/2018   Procedure: APPLICATION OF WOUND VAC;  Surgeon: Jacob [place], MD;  Location: [facility];  Service: Vascular;  Laterality: Bilateral;  . AXILLARY-FEMORAL BYPASS GRAFT Left 05/30/2018   Procedure: BYPASS GRAFT LEFT AXILLA-BIFEMORAL WITH HEMO SHIELD GOLD VASCULAR GRAFTS;  Surgeon: Jacob [place], MD;  Location: [facility];  Service: Vascular;  Laterality: Left;  . COLONOSCOPY WITH PROPOFOL N/A 06/08/2017   Procedure: COLONOSCOPY WITH PROPOFOL ( Ultra-Slim Scope);  Surgeon: Jacob Mayer, MD;  Location: [facility];  Service: Endoscopy;  Laterality: N/A;  . ENDARTERECTOMY FEMORAL Bilateral 05/30/2018   Procedure: ENDARTERECTOMY FEMORAL LEFT;  Surgeon: Jacob [place], MD;  Location: [facility];  Service: Vascular;  Laterality: Bilateral;  .  INTRAOPERATIVE ARTERIOGRAM Bilateral 05/30/2018   Procedure: INTRA OPERATIVE ARTERIOGRAM BILATERAL LOWER EXTREMITIES;  Surgeon: Jacob Max, MD;  Location: [facility];  Service: Vascular;  Laterality: Bilateral;  . LOWER EXTREMITY ANGIOGRAPHY N/A 04/13/2018   Procedure: LOWER EXTREMITY ANGIOGRAPHY;  Surgeon: Jacob Hack[place], MD;  Location: [facility];  Service: Cardiovascular;  Laterality: N/A;  bilateral   . UMBILICAL HERNIA REPAIR     Social History   Socioeconomic History   . Marital status: Married    Spouse name: Jacob Avery  . Number of children: 2  . Years of education: hs gr  . Highest education level: Not on file  Occupational History  . Occupation: retired    Comment: Education administrator  . Financial resource strain: Not on file  . Food insecurity    Worry: Not on file    Inability: Not on file  . Transportation needs    Medical: Not on file    Non-medical: Not on file  Tobacco Use  . Smoking status: Former Smoker    Packs/day: 1.00  . Smokeless tobacco: Never Used  . Tobacco comment: Quit 20 years ago  Substance and Sexual Activity  . Alcohol use: No    Alcohol/week: 0.0 standard drinks  . Drug use: No  . Sexual activity: Not Currently    Partners: Female    Birth control/protection: None  Lifestyle  . Physical activity    Days per week: Not on file    Minutes per session: Not on file  . Stress: Not on file  Relationships  . Social Herbalist on phone: Not on file    Gets together: Not on file    Attends religious service: Not on file    Active member of club or organization: Not on file    Attends meetings of clubs or organizations: Not on file    Relationship status: Not on file  . Intimate partner violence    Fear of current or ex partner: Not on file    Emotionally abused: Not on file    Physically abused: Not on file    Forced sexual activity: Not on file  Other Topics Concern  . Not on file  Social History Narrative   Patient is married with 2 children.   Patient is right handed.   Patient has hs education.   Patient drinks tea on sundays, sodas occ.     Current Meds  Medication Sig  . Accu-Chek FastClix Lancets MISC   . ACCU-CHEK GUIDE test strip USE STRIP TO CHECK GLUCOSE THREE TIMES DAILY  . acetaminophen (TYLENOL ARTHRITIS PAIN) 650 MG CR tablet Take 1,300 mg by mouth 2 (two) times daily as needed for pain.   Marland Kitchen amLODipine (NORVASC) 10 MG tablet Take 10 mg by mouth every evening.   Marland Kitchen aspirin EC 81  MG tablet Take 81 mg by mouth daily.  Marland Kitchen atorvastatin (LIPITOR) 80 MG tablet Take 80 mg by mouth daily.  . clopidogrel (PLAVIX) 75 MG tablet Take 75 mg by mouth daily.   Marland Kitchen ezetimibe (ZETIA) 10 MG tablet Take 10 mg by mouth daily.  . ferrous sulfate 325 (65 FE) MG tablet Take 325 mg by mouth daily with breakfast.  . insulin lispro (HUMALOG) 100 UNIT/ML KiwkPen Inject 10 Units into the skin 2 (two) times daily before a meal.   . LANTUS SOLOSTAR 100 UNIT/ML Solostar Pen Inject 30-40 Units into the skin See admin instructions. Inject 40 units subcutaneously in the  morning, and 30 units subcutaneously in the evening  . losartan-hydrochlorothiazide (HYZAAR) 100-25 MG per tablet Take 1 tablet by mouth daily.  . Oxymetazoline HCl (VICKS SINEX 12 HOUR NA) Place 1 spray into both nostrils daily as needed (congestion).    12 system ROS was negative unless otherwise noted in HPI   Observations/Objective:  DATA  Arterial Duplex of left axillary to left to right fem-fem bpg, and left to right fem-fem-BPG (05-28-19): Left Graft #1: Axillary artery to femoral artery bypass graft +--------------------+--------+--------+----------+---------+                     PSV cm/sStenosisWaveform  Comments  +--------------------+--------+--------+----------+---------+ Inflow              246             monophasic          +--------------------+--------+--------+----------+---------+ Proximal Anastomosis184             monophasicTurbulent +--------------------+--------+--------+----------+---------+ Proximal Graft      150             monophasicTurbulent +--------------------+--------+--------+----------+---------+ Mid Graft           56              monophasic          +--------------------+--------+--------+----------+---------+ Distal Graft        70              monophasic          +--------------------+--------+--------+----------+---------+ Distal Anastamosis  177              monophasic          +--------------------+--------+--------+----------+---------+ Outflow             201             monophasic          +--------------------+--------+--------+----------+---------+  Left Graft #2: Left to right femoral artery to femoral artery bypass graft +--------------------+--------+--------+----------+--------+                     PSV cm/sStenosisWaveform  Comments +--------------------+--------+--------+----------+--------+ Inflow              218             monophasic         +--------------------+--------+--------+----------+--------+ Proximal Anastomosis180             monophasic         +--------------------+--------+--------+----------+--------+ Proximal Graft      121             monophasic         +--------------------+--------+--------+----------+--------+ Mid Graft           44              monophasic         +--------------------+--------+--------+----------+--------+ Distal Graft        43              monophasic         +--------------------+--------+--------+----------+--------+ Distal Anastomosis  109             monophasic         +--------------------+--------+--------+----------+--------+ Outflow             83              monophasic         +--------------------+--------+--------+----------+--------+  Summary: Left Graft(s): Patent left axillary artery to left femoral  artery bypass graft. Elevated velocities at inflow artery may indicate stenosis of 50-74%; Plaque morphology not visualized to support stenosis criteria.  Patent left to right femoral artery bypass graft without evidence of stenosis.   ABI (Date: 05-28-19): ABI Findings: +---------+------------------+-----+----------+--------+ Right    Rt Pressure (mmHg)IndexWaveform  Comment  +---------+------------------+-----+----------+--------+ Brachial 151                    triphasic           +---------+------------------+-----+----------+--------+ PTA      94                0.62 monophasic         +---------+------------------+-----+----------+--------+ DP       80                0.53 monophasic         +---------+------------------+-----+----------+--------+ Great Toe88                0.58 Abnormal           +---------+------------------+-----+----------+--------+  +---------+------------------+-----+----------+-------+ Left     Lt Pressure (mmHg)IndexWaveform  Comment +---------+------------------+-----+----------+-------+ Brachial 152                    triphasic         +---------+------------------+-----+----------+-------+ PTA      94                0.62 monophasic        +---------+------------------+-----+----------+-------+ DP       99                0.65 monophasic        +---------+------------------+-----+----------+-------+ Great Toe104               0.68 Abnormal          +---------+------------------+-----+----------+-------+  +-------+-----------+-----------+------------+------------+ ABI/TBIToday's ABIToday's TBIPrevious ABIPrevious TBI +-------+-----------+-----------+------------+------------+ Right  0.62       0.58       0.45        0.20         +-------+-----------+-----------+------------+------------+ Left   0.65       0.68       0.63        0.54         +-------+-----------+-----------+------------+------------+  Right ABIs appear increased compared to prior study on 05/31/2018. Left ABIs appear essentially unchanged compared to prior study on 05/31/2018. Bilateral TBIs appear essentially unchanged compared to prior study on 7/32/2019.   Summary: Right: Resting right ankle-brachial index indicates moderate right lower extremity arterial disease. The right toe-brachial index is abnormal. RT great toe pressure = 88 mmHg.  Left: Resting left ankle-brachial index indicates moderate  left lower extremity arterial disease. The left toe-brachial index is abnormal. LT Great toe pressure = 104 mmHg.    Assessment and Plan: His walking distance has improved significantly since the last revascularization intervention.  He feels much improved overall.  He is able to trim his hedges and walk to his mailbox and back.  He has no non healing wounds.   Bypass grafts duplex on 05-28-19 shows no significant stenosis, all monophasic waveforms. ABI's indicate moderate disease bilaterally, improved in the right, stable in the left, all monophasic waveforms.   He is a former smoker and states his DM remains in good control.  He takes a daily statin, ASA, and Plavix.   Follow Up Instructions:   Follow up in 6 months with duplex of left  axillary to fem-fem bpg, fem-fem bpg, and ABI's.  I advised him to notify us if he develops concerns re worsening circulation in his feet or legs.   Gradually increase walking to a total of 30 minutes daily. While sitting do ankle rotations clockwise and counterclockwise multiple times/day.    I discussed the assessment and treatment plan with the patient. The patient was provided an opportunity to ask questions and all were answered. The patient agreed with the plan and demonstrated an understanding of the instructions.   The patient was advised to call back or seek an in-person evaluation if the symptoms worsen or if the condition fails to improve as anticipated.  I spent 10 minutes with the patient via telephone encounter.   Gabrielle Dare  [facility] Office: [phone]  06/01/2019, 1:33 PM

## 2019-06-27 ENCOUNTER — Ambulatory Visit (INDEPENDENT_AMBULATORY_CARE_PROVIDER_SITE_OTHER): Payer: Medicare Other | Admitting: Podiatry

## 2019-06-27 ENCOUNTER — Encounter: Payer: Self-pay | Admitting: Podiatry

## 2019-06-27 ENCOUNTER — Other Ambulatory Visit: Payer: Self-pay

## 2019-06-27 DIAGNOSIS — M79675 Pain in left toe(s): Secondary | ICD-10-CM | POA: Diagnosis not present

## 2019-06-27 DIAGNOSIS — M79674 Pain in right toe(s): Secondary | ICD-10-CM | POA: Diagnosis not present

## 2019-06-27 DIAGNOSIS — E1151 Type 2 diabetes mellitus with diabetic peripheral angiopathy without gangrene: Secondary | ICD-10-CM | POA: Diagnosis not present

## 2019-06-27 DIAGNOSIS — B351 Tinea unguium: Secondary | ICD-10-CM | POA: Diagnosis not present

## 2019-06-27 NOTE — Patient Instructions (Signed)
Diabetes Mellitus and Foot Care Foot care is an important part of your health, especially when you have diabetes. Diabetes may cause you to have problems because of poor blood flow (circulation) to your feet and legs, which can cause your skin to:  Become thinner and drier.  Break more easily.  Heal more slowly.  Peel and crack. You may also have nerve damage (neuropathy) in your legs and feet, causing decreased feeling in them. This means that you may not notice minor injuries to your feet that could lead to more serious problems. Noticing and addressing any potential problems early is the best way to prevent future foot problems. How to care for your feet Foot hygiene  Wash your feet daily with warm water and mild soap. Do not use hot water. Then, pat your feet and the areas between your toes until they are completely dry. Do not soak your feet as this can dry your skin.  Trim your toenails straight across. Do not dig under them or around the cuticle. File the edges of your nails with an emery board or nail file.  Apply a moisturizing lotion or petroleum jelly to the skin on your feet and to dry, brittle toenails. Use lotion that does not contain alcohol and is unscented. Do not apply lotion between your toes. Shoes and socks  Wear clean socks or stockings every day. Make sure they are not too tight. Do not wear knee-high stockings since they may decrease blood flow to your legs.  Wear shoes that fit properly and have enough cushioning. Always look in your shoes before you put them on to be sure there are no objects inside.  To break in new shoes, wear them for just a few hours a day. This prevents injuries on your feet. Wounds, scrapes, corns, and calluses  Check your feet daily for blisters, cuts, bruises, sores, and redness. If you cannot see the bottom of your feet, use a mirror or ask someone for help.  Do not cut corns or calluses or try to remove them with medicine.  If you  find a minor scrape, cut, or break in the skin on your feet, keep it and the skin around it clean and dry. You may clean these areas with mild soap and water. Do not clean the area with peroxide, alcohol, or iodine.  If you have a wound, scrape, corn, or callus on your foot, look at it several times a day to make sure it is healing and not infected. Check for: ? Redness, swelling, or pain. ? Fluid or blood. ? Warmth. ? Pus or a bad smell. General instructions  Do not cross your legs. This may decrease blood flow to your feet.  Do not use heating pads or hot water bottles on your feet. They may burn your skin. If you have lost feeling in your feet or legs, you may not know this is happening until it is too late.  Protect your feet from hot and cold by wearing shoes, such as at the beach or on hot pavement.  Schedule a complete foot exam at least once a year (annually) or more often if you have foot problems. If you have foot problems, report any cuts, sores, or bruises to your health care provider immediately. Contact a health care provider if:  You have a medical condition that increases your risk of infection and you have any cuts, sores, or bruises on your feet.  You have an injury that is not   healing.  You have redness on your legs or feet.  You feel burning or tingling in your legs or feet.  You have pain or cramps in your legs and feet.  Your legs or feet are numb.  Your feet always feel cold.  You have pain around a toenail. Get help right away if:  You have a wound, scrape, corn, or callus on your foot and: ? You have pain, swelling, or redness that gets worse. ? You have fluid or blood coming from the wound, scrape, corn, or callus. ? Your wound, scrape, corn, or callus feels warm to the touch. ? You have pus or a bad smell coming from the wound, scrape, corn, or callus. ? You have a fever. ? You have a red line going up your leg. Summary  Check your feet every day  for cuts, sores, red spots, swelling, and blisters.  Moisturize feet and legs daily.  Wear shoes that fit properly and have enough cushioning.  If you have foot problems, report any cuts, sores, or bruises to your health care provider immediately.  Schedule a complete foot exam at least once a year (annually) or more often if you have foot problems. This information is not intended to replace advice given to you by your health care provider. Make sure you discuss any questions you have with your health care provider. Document Released: 10/15/2000 Document Revised: 11/30/2017 Document Reviewed: 11/19/2016 Elsevier Patient Education  2020 [employer]   Onychomycosis/Fungal Toenails  WHAT IS IT? An infection that lies within the keratin of your nail plate that is caused by a fungus.  WHY ME? Fungal infections affect all ages, sexes, races, and creeds.  There may be many factors that predispose you to a fungal infection such as age, coexisting medical conditions such as diabetes, or an autoimmune disease; stress, medications, fatigue, genetics, etc.  Bottom line: fungus thrives in a warm, moist environment and your shoes offer such a location.  IS IT CONTAGIOUS? Theoretically, yes.  You do not want to share shoes, nail clippers or files with someone who has fungal toenails.  Walking around barefoot in the same room or sleeping in the same bed is unlikely to transfer the organism.  It is important to realize, however, that fungus can spread easily from one nail to the next on the same foot.  HOW DO WE TREAT THIS?  There are several ways to treat this condition.  Treatment may depend on many factors such as age, medications, pregnancy, liver and kidney conditions, etc.  It is best to ask your doctor which options are available to you.  1. No treatment.   Unlike many other medical concerns, you can live with this condition.  However for many people this can be a painful condition and may lead to  ingrown toenails or a bacterial infection.  It is recommended that you keep the nails cut short to help reduce the amount of fungal nail. 2. Topical treatment.  These range from herbal remedies to prescription strength nail lacquers.  About 40-50% effective, topicals require twice daily application for approximately 9 to 12 months or until an entirely new nail has grown out.  The most effective topicals are medical grade medications available through physicians offices. 3. Oral antifungal medications.  With an 80-90% cure rate, the most common oral medication requires 3 to 4 months of therapy and stays in your system for a year as the new nail grows out.  Oral antifungal medications do require   blood work to make sure it is a safe drug for you.  A liver function panel will be performed prior to starting the medication and after the first month of treatment.  It is important to have the blood work performed to avoid any harmful side effects.  In general, this medication safe but blood work is required. 4. Laser Therapy.  This treatment is performed by applying a specialized laser to the affected nail plate.  This therapy is noninvasive, fast, and non-painful.  It is not covered by insurance and is therefore, out of pocket.  The results have been very good with a 80-95% cure rate.  The [facility] is the only practice in the area to offer this therapy. 5. Permanent Nail Avulsion.  Removing the entire nail so that a new nail will not grow back. 

## 2019-07-05 NOTE — Progress Notes (Signed)
Subjective: Jacob Avery is a 73 y.o. y.o. male who presents for preventative foot care today with h/o diabetes and PAD and cc of painful, discolored, thick toenails and painful callus/corn which interfere with daily activities. Pain is aggravated when wearing enclosed shoe gear. Pain is relieved with periodic professional debridement.  He is on blood thinner, Plavix.   Current Outpatient Medications:  .  Accu-Chek FastClix Lancets MISC, , Disp: , Rfl:  .  ACCU-CHEK GUIDE test strip, USE STRIP TO CHECK GLUCOSE THREE TIMES DAILY, Disp: , Rfl:  .  acetaminophen (TYLENOL ARTHRITIS PAIN) 650 MG CR tablet, Take 1,300 mg by mouth 2 (two) times daily as needed for pain. , Disp: , Rfl:  .  amLODipine (NORVASC) 10 MG tablet, Take 10 mg by mouth every evening. , Disp: , Rfl:  .  aspirin EC 81 MG tablet, Take 81 mg by mouth daily., Disp: , Rfl:  .  atorvastatin (LIPITOR) 80 MG tablet, Take 80 mg by mouth daily., Disp: , Rfl:  .  clopidogrel (PLAVIX) 75 MG tablet, Take 75 mg by mouth daily. , Disp: , Rfl:  .  colchicine (COLCRYS) 0.6 MG tablet, Take 0.6 mg by mouth daily as needed (for gout flare ups.). , Disp: , Rfl:  .  ezetimibe (ZETIA) 10 MG tablet, Take 10 mg by mouth daily., Disp: , Rfl:  .  ferrous sulfate 325 (65 FE) MG tablet, Take 325 mg by mouth daily with breakfast., Disp: , Rfl:  .  insulin lispro (HUMALOG) 100 UNIT/ML KiwkPen, Inject 10 Units into the skin 2 (two) times daily before a meal. , Disp: , Rfl:  .  LANTUS SOLOSTAR 100 UNIT/ML Solostar Pen, Inject 30-40 Units into the skin See admin instructions. Inject 40 units subcutaneously in the morning, and 30 units subcutaneously in the evening, Disp: , Rfl:  .  losartan-hydrochlorothiazide (HYZAAR) 100-25 MG per tablet, Take 1 tablet by mouth daily., Disp: , Rfl:  .  nitroGLYCERIN (NITROSTAT) 0.4 MG SL tablet, Place 1 tablet (0.4 mg total) under the tongue every 5 (five) minutes as needed for chest pain. (Patient not taking: Reported on  06/01/2019), Disp: 25 tablet, Rfl: 3 .  OVER THE COUNTER MEDICATION, Apply 1 application topically daily as needed (pain). Two Old Goats otc muscle rub, Disp: , Rfl:  .  oxyCODONE-acetaminophen (PERCOCET/ROXICET) 5-325 MG tablet, Take 1 tablet by mouth every 6 (six) hours as needed for moderate pain. (Patient not taking: Reported on 06/01/2019), Disp: 20 tablet, Rfl: 0 .  Oxymetazoline HCl (VICKS SINEX 12 HOUR NA), Place 1 spray into both nostrils daily as needed (congestion)., Disp: , Rfl:  .  tamsulosin (FLOMAX) 0.4 MG CAPS capsule, , Disp: , Rfl:   No Known Allergies  Objective: There were no vitals filed for this visit.  Vascular Examination: Capillary refill time immediate x 10 digits.  Dorsalis pedis pulses absent b/l.  Posterior tibial pulses absent b/l.  No digital hair x 10 digits.  Skin temperature WNL b/l.  Dermatological Examination: Skin with normal turgor, texture and tone b/l.  Toenails 1-5 b/l discolored, thick, dystrophic with subungual debris and pain with palpation to nailbeds due to thickness of nails.  Musculoskeletal: Muscle strength 5/5 to all LE muscle groups.  HAV with bunion b/l.  Hammertoe deformity 2-5 b/l.  Palpable exostosis dorsal midfoot b/l.  Neurological: Sensation intact 5/5 b/l with 10 gram monofilament.  Vibratory sensation intact b/l.  Assessment: 1. Painful onychomycosis toenails 1-5 b/l 2.   NIDDM with Peripheral arterial disease  Plan: 1. Continue diabetic foot care principles. Literature dispensed. 2. Toenails 1-5 b/l were debrided in length and girth without iatrogenic bleeding. 3. Patient to continue soft, supportive shoe gear daily. 4. Patient to report any pedal injuries to medical professional immediately. 5. Follow up 3 months.  6. Patient/POA to call should there be a concern in the interim.

## 2019-09-25 ENCOUNTER — Ambulatory Visit: Payer: Medicare Other | Admitting: Podiatry

## 2019-09-25 ENCOUNTER — Encounter: Payer: Self-pay | Admitting: Podiatry

## 2019-09-25 ENCOUNTER — Other Ambulatory Visit: Payer: Self-pay

## 2019-09-25 DIAGNOSIS — L84 Corns and callosities: Secondary | ICD-10-CM

## 2019-09-25 DIAGNOSIS — M79675 Pain in left toe(s): Secondary | ICD-10-CM | POA: Diagnosis not present

## 2019-09-25 DIAGNOSIS — E1151 Type 2 diabetes mellitus with diabetic peripheral angiopathy without gangrene: Secondary | ICD-10-CM | POA: Diagnosis not present

## 2019-09-25 DIAGNOSIS — B351 Tinea unguium: Secondary | ICD-10-CM

## 2019-09-25 DIAGNOSIS — M79674 Pain in right toe(s): Secondary | ICD-10-CM | POA: Diagnosis not present

## 2019-09-25 NOTE — Patient Instructions (Signed)

## 2019-09-29 NOTE — Progress Notes (Signed)
Subjective: Jacob Avery presents to clinic with cc of painful corns and mycotic toenails b/l feet which are aggravated when weightbearing with and without shoe gear.  This pain limits his daily activities. Pain symptoms resolve with periodic professional debridement.  [name], Fransico Him, MD is his PCP.   Medications reviewed in chart.  No Known Allergies   Objective: There were no vitals filed for this visit.  Physical Examination:  Vascular  Examination: Capillary refill time immediate b/l.  Nonpalpable DP/PT pulses b/l.  Digital hair absent b/l.  No edema noted b/l.  Skin temperature gradient WNL b/l.  Dermatological Examination: Skin with normal turgor, texture and tone b/l.  No open wounds b/l.  No interdigital macerations noted b/l.  Elongated, thick, discolored brittle toenails with subungual debris and pain on dorsal palpation of nailbeds 1-5 b/l.  Hyperkeratotic lesion lateral 5th digit nailplate b/l with tenderness to palpation. No edema, no erythema, no drainage, no flocculence.   Musculoskeletal Examination: Muscle strength 5/5 to all muscle groups b/l.  HAV with bunion b/l.   Hammertoes 2-5 b/l.   Palpable exostosis dorsal midfoot b/l.  Neurological Examination: Sensation intact 5/5 b/l with 10 gram monofilament.  Vibratory sensation intact b/l.  Assessment: 1. Mycotic nail infection with pain 1-5 b/l 2. Corn/Callus 3.   NIDDM with PAD  Plan: 1. Toenails 1-5 b/l were debrided in length and girth without iatrogenic laceration. 2. Corns b/l 5th digits pared utilizing sterile scalpel blade without incident. 3. Continue soft, supportive shoe gear daily. 4. Report any pedal injuries to medical professional. 5. Follow up 3 months. 6. Patient/POA to call should there be a question/concern in there interim.

## 2019-10-17 ENCOUNTER — Telehealth: Payer: Self-pay | Admitting: *Deleted

## 2019-10-17 NOTE — Telephone Encounter (Signed)
Neomia Glass, PA from [facility] calls wanted to make Dr [name] aware that today pt BP was 160/62 HR 46 pt is due for f/u in January - pt denies any symptoms at this time - LOV in 10/2018 HR was 48 - will forward FYI to provider - scheduled pt f/u In February

## 2019-10-18 ENCOUNTER — Telehealth: Payer: Self-pay | Admitting: *Deleted

## 2019-10-18 NOTE — Telephone Encounter (Signed)
error 

## 2019-10-18 NOTE — Telephone Encounter (Signed)
If it is routinely elevated, he can start hydralazine 25 mg 3 times daily.  It appears he has asymptomatic bradycardia.

## 2019-10-23 NOTE — Telephone Encounter (Signed)
Pt says BP has not been routinely running high - normally SBP 140s-150s - pt will call us back if BP remains elevated

## 2019-12-20 ENCOUNTER — Ambulatory Visit: Payer: Medicare Other | Admitting: Cardiovascular Disease

## 2019-12-25 ENCOUNTER — Encounter: Payer: Self-pay | Admitting: Podiatry

## 2019-12-25 ENCOUNTER — Ambulatory Visit: Payer: Medicare Other | Admitting: Podiatry

## 2019-12-25 ENCOUNTER — Other Ambulatory Visit: Payer: Self-pay

## 2019-12-25 DIAGNOSIS — L84 Corns and callosities: Secondary | ICD-10-CM | POA: Diagnosis not present

## 2019-12-25 DIAGNOSIS — B351 Tinea unguium: Secondary | ICD-10-CM

## 2019-12-25 DIAGNOSIS — M79675 Pain in left toe(s): Secondary | ICD-10-CM | POA: Diagnosis not present

## 2019-12-25 DIAGNOSIS — M79674 Pain in right toe(s): Secondary | ICD-10-CM

## 2019-12-25 DIAGNOSIS — E1151 Type 2 diabetes mellitus with diabetic peripheral angiopathy without gangrene: Secondary | ICD-10-CM

## 2019-12-25 NOTE — Patient Instructions (Signed)
Diabetes Mellitus and Foot Care Foot care is an important part of your health, especially when you have diabetes. Diabetes may cause you to have problems because of poor blood flow (circulation) to your feet and legs, which can cause your skin to:  Become thinner and drier.  Break more easily.  Heal more slowly.  Peel and crack. You may also have nerve damage (neuropathy) in your legs and feet, causing decreased feeling in them. This means that you may not notice minor injuries to your feet that could lead to more serious problems. Noticing and addressing any potential problems early is the best way to prevent future foot problems. How to care for your feet Foot hygiene  Wash your feet daily with warm water and mild soap. Do not use hot water. Then, pat your feet and the areas between your toes until they are completely dry. Do not soak your feet as this can dry your skin.  Trim your toenails straight across. Do not dig under them or around the cuticle. File the edges of your nails with an emery board or nail file.  Apply a moisturizing lotion or petroleum jelly to the skin on your feet and to dry, brittle toenails. Use lotion that does not contain alcohol and is unscented. Do not apply lotion between your toes. Shoes and socks  Wear clean socks or stockings every day. Make sure they are not too tight. Do not wear knee-high stockings since they may decrease blood flow to your legs.  Wear shoes that fit properly and have enough cushioning. Always look in your shoes before you put them on to be sure there are no objects inside.  To break in new shoes, wear them for just a few hours a day. This prevents injuries on your feet. Wounds, scrapes, corns, and calluses  Check your feet daily for blisters, cuts, bruises, sores, and redness. If you cannot see the bottom of your feet, use a mirror or ask someone for help.  Do not cut corns or calluses or try to remove them with medicine.  If you  find a minor scrape, cut, or break in the skin on your feet, keep it and the skin around it clean and dry. You may clean these areas with mild soap and water. Do not clean the area with peroxide, alcohol, or iodine.  If you have a wound, scrape, corn, or callus on your foot, look at it several times a day to make sure it is healing and not infected. Check for: ? Redness, swelling, or pain. ? Fluid or blood. ? Warmth. ? Pus or a bad smell. General instructions  Do not cross your legs. This may decrease blood flow to your feet.  Do not use heating pads or hot water bottles on your feet. They may burn your skin. If you have lost feeling in your feet or legs, you may not know this is happening until it is too late.  Protect your feet from hot and cold by wearing shoes, such as at the beach or on hot pavement.  Schedule a complete foot exam at least once a year (annually) or more often if you have foot problems. If you have foot problems, report any cuts, sores, or bruises to your health care provider immediately. Contact a health care provider if:  You have a medical condition that increases your risk of infection and you have any cuts, sores, or bruises on your feet.  You have an injury that is not   healing.  You have redness on your legs or feet.  You feel burning or tingling in your legs or feet.  You have pain or cramps in your legs and feet.  Your legs or feet are numb.  Your feet always feel cold.  You have pain around a toenail. Get help right away if:  You have a wound, scrape, corn, or callus on your foot and: ? You have pain, swelling, or redness that gets worse. ? You have fluid or blood coming from the wound, scrape, corn, or callus. ? Your wound, scrape, corn, or callus feels warm to the touch. ? You have pus or a bad smell coming from the wound, scrape, corn, or callus. ? You have a fever. ? You have a red line going up your leg. Summary  Check your feet every day  for cuts, sores, red spots, swelling, and blisters.  Moisturize feet and legs daily.  Wear shoes that fit properly and have enough cushioning.  If you have foot problems, report any cuts, sores, or bruises to your health care provider immediately.  Schedule a complete foot exam at least once a year (annually) or more often if you have foot problems. This information is not intended to replace advice given to you by your health care provider. Make sure you discuss any questions you have with your health care provider. Document Revised: 07/11/2019 Document Reviewed: 11/19/2016 Elsevier Patient Education  2020 [employer]  

## 2019-12-27 NOTE — Progress Notes (Signed)
Subjective: Jacob Avery presents today for follow up of preventative diabetic foot care and corn(s) b/l 5th digits and callus(es) right hallux and painful mycotic nails b/l.  Pain interferes with ambulation. Aggravating factors include wearing enclosed shoe gear.   He relates both 5th toes are tender on today's visit. He voices no new pedal problems on today's visit.   No Known Allergies   Objective: There were no vitals filed for this visit.  Vascular Examination:  Capillary refill time to digits immediate b/l, nonpalpable DP pulses b/l, non-palpable PT pulses b/l, pedal hair absent b/l and skin temperature gradient within normal limits b/l  Dermatological Examination: Pedal skin with normal turgor, texture and tone bilaterally, toenails 1-5 b/l elongated, dystrophic, thickened, crumbly with subungual debris and hyperkeratotic lesion(s) lateral nailfold b/l 5th digits and right hallux IPJ.  No erythema, no edema, no drainage, no flocculence  Musculoskeletal: Normal muscle strength 5/5 to all lower extremity muscle groups bilaterally, no pain crepitus or joint limitation noted with ROM b/l, bunion deformity noted b/l, hammertoes noted to the  2-5 bilaterally and OA dorsomedial midfoot b/l.  Neurological: Protective sensation intact 5/5 intact bilaterally with 10g monofilament b/l and vibratory sensation intact b/l  Assessment: 1. Pain due to onychomycosis of toenails of both feet   2. Corns and callosities   3. Type II diabetes mellitus with peripheral circulatory disorder (HCC)    Plan: -Continue diabetic foot care principles. Literature dispensed on today.  -Toenails 1-5 b/l were debrided in length and girth with sterile nail nippers and dremel without iatrogenic bleeding. -Corns and calluses were debrided without complication or incident. Total number debrided =3 b/l 5th digits and right hallux -Patient to continue soft, supportive shoe gear daily. -Patient to report any pedal  injuries to medical professional immediately. -Patient/POA to call should there be question/concern in the interim.  Return in about 3 months (around 03/23/2020) for diabetic nail trim.

## 2020-01-04 ENCOUNTER — Other Ambulatory Visit: Payer: Self-pay

## 2020-01-04 ENCOUNTER — Encounter: Payer: Self-pay | Admitting: Nurse Practitioner

## 2020-01-04 ENCOUNTER — Ambulatory Visit (INDEPENDENT_AMBULATORY_CARE_PROVIDER_SITE_OTHER): Payer: Medicare Other | Admitting: Nurse Practitioner

## 2020-01-04 VITALS — BP 150/70 | HR 52 | Temp 98.2°F | Ht 68.0 in | Wt 277.2 lb

## 2020-01-04 DIAGNOSIS — K5900 Constipation, unspecified: Secondary | ICD-10-CM | POA: Diagnosis not present

## 2020-01-04 DIAGNOSIS — K648 Other hemorrhoids: Secondary | ICD-10-CM

## 2020-01-04 DIAGNOSIS — K625 Hemorrhage of anus and rectum: Secondary | ICD-10-CM

## 2020-01-04 MED ORDER — HYDROCORTISONE (PERIANAL) 2.5 % EX CREA: TOPICAL_CREAM | CUTANEOUS | 0 refills | Status: DC

## 2020-01-04 NOTE — Progress Notes (Signed)
IMPRESSION and PLAN:    Aspyn Schrag is a 74 y.o. male with a pmh significant for, not necessarily limited to obesity, colon polyps, DM2,  CVA on plavix, left eye AA, CKD 2  # Self -limited painless rectal bleeding on plavix. Resolved.  --Hgb 12.2 after bleeding ( PCP's office) --A couple of episodes of bleeding approximately 2 weeks ago in setting of constipation.  Internal hemorrhoids on anoscopy today, suspect cause for the bleeding. Doubt diverticular hemorrhage --Treat constipation --Anusol cream nightly x10 days --Patient will let us know if he has recurrent bleeding.  He may be a candidate for internal hemorrhoid banding    # Constipation with hard stools / straining on iron --Start daily Metamucil --64 ounces of water daily  # History of adenomatous colon polyps.   --Due for 5-year surveillance colonoscopy August when he 2023 HPI:    Primary GI: Dr. Carlean Purl  Chief complaint : Episode of rectal bleeding a couple weeks ago  **History comes from the chart and patient  Monday before last patient noticed a small amount of blood when wiping after a BM. On Tuesday he had ~ two episodes of loose dark stools ( his stools are normally dark on iron) with blood. No associated abdominal or rectal pain. No bleeding since. Went to see PCP for the bleeding. Had labs on 12/26/2019 which showed hemoglobin normal at 12.2. Plavix was put on hold by PCP. Justun takes iron and typically has dark stools. His stools are hard.  He had a colonoscopy in 2018 for evaluation of rectal bleeding.   Data Reviewed:   06/08/2017 colonoscopy for rectal bleeding -Complete exam, good prep Three 4 to 8 mm polyps in the rectum, in the transverse colon and in the cecum, removed with a cold snare. Resected and retrieved. - Severe diverticulosis in the sigmoid colon. There was narrowing of the colon in association with the diverticular opening. - The examination was otherwise normal on direct and  retroflexion views.  Review of systems:     No chest pain, no SOB, no fevers, no urinary sx   Past Medical History:  Diagnosis Date  . Arthritis   . Chronic kidney disease    Stage 2  . CVA (cerebral vascular accident) ([place]) 10/2014    Late effect of cerebrovascular disease.  Mostly resolved, but can see difference in facial expressions.  tp-A, Plavix afterward  . Diabetes mellitus without complication ([place])    type II, controlled, with renal comps  . Diabetic foot ([place]) 04/05/2018   Normal sensation, skin intact  . Diverticulosis   . ED (erectile dysfunction)   . GI bleed 04/2017  . Gout   . History of claustrophobia    with MRI  . Hx of adenomatous polyp of rectum 06/10/2017  . Hypercholesterolemia   . Hypertension   . Long term (current) use of insulin ([place])    Remains on basal/bolus therapy without hypoglycemia.  . Microalbuminuria 2013  . Morbid obesity ([place])   . Obesity   . OSA (obstructive sleep apnea)    Intolerant to CPAP  . Peripheral vascular disease ([place])   . Right calf pain 04/05/2018  . Vertigo     Patient's surgical history, family medical history, social history, medications and allergies were all reviewed in Epic   Creatinine clearance cannot be calculated (Patient's most recent lab result is older than the maximum 21 days allowed.)  Current Outpatient Medications  Medication Sig Dispense  Refill  . Accu-Chek FastClix Lancets MISC     . ACCU-CHEK GUIDE test strip USE STRIP TO CHECK GLUCOSE THREE TIMES DAILY    . acetaminophen (TYLENOL ARTHRITIS PAIN) 650 MG CR tablet Take 1,300 mg by mouth 2 (two) times daily as needed for pain.     Marland Kitchen amLODipine (NORVASC) 10 MG tablet Take 10 mg by mouth every evening.     Marland Kitchen aspirin EC 81 MG tablet Take 81 mg by mouth daily.    Marland Kitchen atorvastatin (LIPITOR) 80 MG tablet Take 80 mg by mouth daily.    . clopidogrel (PLAVIX) 75 MG tablet Take 75 mg by mouth daily.     . colchicine (COLCRYS) 0.6 MG tablet Take 0.6 mg by mouth  daily as needed (for gout flare ups.).     Marland Kitchen ezetimibe (ZETIA) 10 MG tablet Take 10 mg by mouth daily.    . ferrous sulfate 325 (65 FE) MG tablet Take 325 mg by mouth daily with breakfast.    . insulin lispro (HUMALOG) 100 UNIT/ML KiwkPen Inject 10 Units into the skin 2 (two) times daily before a meal.     . LANTUS SOLOSTAR 100 UNIT/ML Solostar Pen Inject 30-40 Units into the skin See admin instructions. Inject 40 units subcutaneously in the morning, and 30 units subcutaneously in the evening    . losartan-hydrochlorothiazide (HYZAAR) 100-25 MG per tablet Take 1 tablet by mouth daily.    . nitroGLYCERIN (NITROSTAT) 0.4 MG SL tablet Place 1 tablet (0.4 mg total) under the tongue every 5 (five) minutes as needed for chest pain. 25 tablet 3  . omeprazole (PRILOSEC) 20 MG capsule Take 20 mg by mouth daily.    Marland Kitchen OVER THE COUNTER MEDICATION Apply 1 application topically daily as needed (pain). Two Old Goats otc muscle rub    . oxyCODONE-acetaminophen (PERCOCET/ROXICET) 5-325 MG tablet Take 1 tablet by mouth every 6 (six) hours as needed for moderate pain. 20 tablet 0  . Oxymetazoline HCl (VICKS SINEX 12 HOUR NA) Place 1 spray into both nostrils daily as needed (congestion).    . tamsulosin (FLOMAX) 0.4 MG CAPS capsule      No current facility-administered medications for this visit.    Physical Exam:     BP (!) 150/70 (BP Location: Left Arm, Patient Position: Sitting, Cuff Size: Normal)   Pulse (!) 52   Temp 98.2 F (36.8 C)   Ht 5\' 8"  (1.727 m)   Wt 277 lb 4 oz (125.8 kg)   BMI 42.16 kg/m   GENERAL:  Pleasant male in NAD PSYCH: : Cooperative, normal affect CARDIAC:  RRR, no peripheral edema PULM: Normal respiratory effort, lungs CTA bilaterally, no wheezing ABDOMEN:  Nondistended, soft, nontender. No obvious masses, no hepatomegaly,  normal bowel sounds Rectal: no perianal lesions seen. Non-tender on DRE. Inflamed internal hemorrhoids on anoscopy SKIN:  turgor, no lesions  seen Musculoskeletal:  Normal muscle tone, normal strength NEURO: Alert and oriented x 3, no focal neurologic deficits   Tye Savoy , NP 01/04/2020, 11:23 AM

## 2020-01-04 NOTE — Patient Instructions (Signed)
If you are age 75 or older, your body mass index should be between 23-30. Your Body mass index is 42.16 kg/m. If this is out of the aforementioned range listed, please consider follow up with your Primary Care Provider.  If you are age 5 or younger, your body mass index should be between 19-25. Your Body mass index is 42.16 kg/m. If this is out of the aformentioned range listed, please consider follow up with your Primary Care Provider.   We have sent the following medications to your pharmacy for you to pick up at your convenience: Anusol Cream apply to the rectum at bed time for 10 days.  Start drinking at least 64 oz of water daily.   Add Metamucil daily as directed.  Please call the office if you have recurrent bleeding.

## 2020-01-10 ENCOUNTER — Ambulatory Visit (INDEPENDENT_AMBULATORY_CARE_PROVIDER_SITE_OTHER): Payer: Medicare Other | Admitting: Cardiovascular Disease

## 2020-01-10 ENCOUNTER — Encounter: Payer: Self-pay | Admitting: Cardiovascular Disease

## 2020-01-10 ENCOUNTER — Ambulatory Visit (INDEPENDENT_AMBULATORY_CARE_PROVIDER_SITE_OTHER): Payer: Medicare Other

## 2020-01-10 VITALS — BP 140/56 | HR 44 | Temp 98.4°F | Ht 67.0 in | Wt 279.0 lb

## 2020-01-10 DIAGNOSIS — I25118 Atherosclerotic heart disease of native coronary artery with other forms of angina pectoris: Secondary | ICD-10-CM

## 2020-01-10 DIAGNOSIS — Z8673 Personal history of transient ischemic attack (TIA), and cerebral infarction without residual deficits: Secondary | ICD-10-CM

## 2020-01-10 DIAGNOSIS — I10 Essential (primary) hypertension: Secondary | ICD-10-CM | POA: Diagnosis not present

## 2020-01-10 DIAGNOSIS — E785 Hyperlipidemia, unspecified: Secondary | ICD-10-CM | POA: Diagnosis not present

## 2020-01-10 DIAGNOSIS — I739 Peripheral vascular disease, unspecified: Secondary | ICD-10-CM

## 2020-01-10 DIAGNOSIS — R001 Bradycardia, unspecified: Secondary | ICD-10-CM

## 2020-01-10 DIAGNOSIS — R0600 Dyspnea, unspecified: Secondary | ICD-10-CM

## 2020-01-10 DIAGNOSIS — R0609 Other forms of dyspnea: Secondary | ICD-10-CM

## 2020-01-10 NOTE — Patient Instructions (Addendum)
Medication Instructions:  Your physician recommends that you continue on your current medications as directed. Please refer to the Current Medication list given to you today.  *If you need a refill on your cardiac medications before your next appointment, please call your pharmacy*   Lab Work: None today If you have labs (blood work) drawn today and your tests are completely normal, you will receive your results only by: Marland Kitchen MyChart Message (if you have MyChart) OR . A paper copy in the mail If you have any lab test that is abnormal or we need to change your treatment, we will call you to review the results.   Testing/Procedures: Your physician has recommended that you wear an event monitor ( zio for 14 days) . Event monitors are medical devices that record the heart's electrical activity. Doctors most often [place] these monitors to diagnose arrhythmias. Arrhythmias are problems with the speed or rhythm of the heartbeat. The monitor is a small, portable device. You can wear one while you do your normal daily activities. This is usually used to diagnose what is causing palpitations/syncope (passing out).     Follow-Up: At [facility], you and your health needs are our priority.  As part of our continuing mission to provide you with exceptional heart care, we have created designated Provider Care Teams.  These Care Teams include your primary Cardiologist (physician) and Advanced Practice Providers (APPs -  Physician Assistants and Nurse Practitioners) who all work together to provide you with the care you need, when you need it.  We recommend signing up for the patient portal called "MyChart".  Sign up information is provided on this After Visit Summary.  MyChart is used to connect with patients for Virtual Visits (Telemedicine).  Patients are able to view lab/test results, encounter notes, upcoming appointments, etc.  Non-urgent messages can be sent to your provider as well.   To learn more about  what you can do with MyChart, go to [email or web address].    Your next appointment:   6 month(s)  The format for your next appointment:   In Person  Provider:   Kate Sable, MD   Other Instructions None     Thank you for choosing [facility] !

## 2020-01-10 NOTE — Addendum Note (Signed)
Addended by: Barbarann Ehlers A on: 01/10/2020 02:07 PM   Modules accepted: Orders

## 2020-01-10 NOTE — Addendum Note (Signed)
Addended by: Levonne Hubert on: 01/10/2020 02:04 PM   Modules accepted: Orders

## 2020-01-10 NOTE — Progress Notes (Signed)
SUBJECTIVE: Jacob Avery is a 74 y.o. male with hypertension,presumed CAD, peripheral vascular disease,history of CVA, and hyperlipidemia.  He underwent a nuclear stress test on 04/25/2018 which was low risk but did show prior inferoseptal myocardial infarction with mild peri-infarct ischemia, EF 55 to 65%.  He underwent a left femoral endarterectomy and left axillobifemoral bypass by Dr. Adele Barthel on 05/30/2018.  He denies chest pain, orthopnea, leg swelling, claudication, and palpitations.  He does have some exertional dyspnea and fatigue but this recovers with rest and then he can go about doing his normal daily activities.  He denies dizziness and syncope.  He recently had some issues with rectal bleeding and hemorrhoids and was evaluated by gastroenterology.  ECG performed today which I personally view demonstrates marked sinus bradycardia, 44 bpm.  Review of Systems: As per "subjective", otherwise negative.  No Known Allergies  Current Outpatient Medications  Medication Sig Dispense Refill  . Accu-Chek FastClix Lancets MISC     . ACCU-CHEK GUIDE test strip USE STRIP TO CHECK GLUCOSE THREE TIMES DAILY    . acetaminophen (TYLENOL ARTHRITIS PAIN) 650 MG CR tablet Take 1,300 mg by mouth 2 (two) times daily as needed for pain.     Marland Kitchen amLODipine (NORVASC) 10 MG tablet Take 10 mg by mouth every evening.     Marland Kitchen aspirin EC 81 MG tablet Take 81 mg by mouth daily.    Marland Kitchen atorvastatin (LIPITOR) 80 MG tablet Take 80 mg by mouth daily.    . clopidogrel (PLAVIX) 75 MG tablet Take 75 mg by mouth daily.     . colchicine (COLCRYS) 0.6 MG tablet Take 0.6 mg by mouth daily as needed (for gout flare ups.).     Marland Kitchen ezetimibe (ZETIA) 10 MG tablet Take 10 mg by mouth daily.    . ferrous sulfate 325 (65 FE) MG tablet Take 325 mg by mouth daily with breakfast.    . hydrocortisone (ANUSOL-HC) 2.5 % rectal cream 1 application to the rectum at bedtime for 10 days 28 g 0  . insulin lispro (HUMALOG) 100  UNIT/ML KiwkPen Inject 10 Units into the skin 2 (two) times daily before a meal.     . LANTUS SOLOSTAR 100 UNIT/ML Solostar Pen Inject 30-40 Units into the skin See admin instructions. Inject 40 units subcutaneously in the morning, and 30 units subcutaneously in the evening    . losartan-hydrochlorothiazide (HYZAAR) 100-25 MG per tablet Take 1 tablet by mouth daily.    . nitroGLYCERIN (NITROSTAT) 0.4 MG SL tablet Place 1 tablet (0.4 mg total) under the tongue every 5 (five) minutes as needed for chest pain. 25 tablet 3  . omeprazole (PRILOSEC) 20 MG capsule Take 20 mg by mouth daily.    Marland Kitchen OVER THE COUNTER MEDICATION Apply 1 application topically daily as needed (pain). Two Old Goats otc muscle rub    . oxyCODONE-acetaminophen (PERCOCET/ROXICET) 5-325 MG tablet Take 1 tablet by mouth every 6 (six) hours as needed for moderate pain. 20 tablet 0  . Oxymetazoline HCl (VICKS SINEX 12 HOUR NA) Place 1 spray into both nostrils daily as needed (congestion).    . tamsulosin (FLOMAX) 0.4 MG CAPS capsule      No current facility-administered medications for this visit.    Past Medical History:  Diagnosis Date  . Arthritis   . Chronic kidney disease    Stage 2  . CVA (cerebral vascular accident) ([place]) 10/2014    Late effect of cerebrovascular disease.  Mostly resolved, but  can see difference in facial expressions.  tp-A, Plavix afterward  . Diabetes mellitus without complication ([place])    type II, controlled, with renal comps  . Diabetic foot ([place]) 04/05/2018   Normal sensation, skin intact  . Diverticulosis   . ED (erectile dysfunction)   . GI bleed 04/2017  . Gout   . History of claustrophobia    with MRI  . Hx of adenomatous polyp of rectum 06/10/2017  . Hypercholesterolemia   . Hypertension   . Long term (current) use of insulin ([place])    Remains on basal/bolus therapy without hypoglycemia.  . Microalbuminuria 2013  . Morbid obesity ([place])   . Obesity   . OSA (obstructive sleep apnea)     Intolerant to CPAP  . Peripheral vascular disease ([place])   . Right calf pain 04/05/2018  . Vertigo     Past Surgical History:  Procedure Laterality Date  . ABDOMINAL AORTOGRAM N/A 04/13/2018   Procedure: ABDOMINAL AORTOGRAM;  Surgeon: Conrad Timblin, MD;  Location: [facility];  Service: Cardiovascular;  Laterality: N/A;  . APPLICATION OF WOUND VAC Bilateral 05/30/2018   Procedure: APPLICATION OF WOUND VAC;  Surgeon: Conrad Weddington, MD;  Location: [facility];  Service: Vascular;  Laterality: Bilateral;  . AXILLARY-FEMORAL BYPASS GRAFT Left 05/30/2018   Procedure: BYPASS GRAFT LEFT AXILLA-BIFEMORAL WITH HEMO SHIELD GOLD VASCULAR GRAFTS;  Surgeon: Conrad Berryville, MD;  Location: [facility];  Service: Vascular;  Laterality: Left;  . COLONOSCOPY WITH PROPOFOL N/A 06/08/2017   Procedure: COLONOSCOPY WITH PROPOFOL ( Ultra-Slim Scope);  Surgeon: Gatha Mayer, MD;  Location: [facility];  Service: Endoscopy;  Laterality: N/A;  . ENDARTERECTOMY FEMORAL Bilateral 05/30/2018   Procedure: ENDARTERECTOMY FEMORAL LEFT;  Surgeon: Conrad Rushford, MD;  Location: [facility];  Service: Vascular;  Laterality: Bilateral;  . INTRAOPERATIVE ARTERIOGRAM Bilateral 05/30/2018   Procedure: INTRA OPERATIVE ARTERIOGRAM BILATERAL LOWER EXTREMITIES;  Surgeon: Conrad Hardin, MD;  Location: [facility];  Service: Vascular;  Laterality: Bilateral;  . LOWER EXTREMITY ANGIOGRAPHY N/A 04/13/2018   Procedure: LOWER EXTREMITY ANGIOGRAPHY;  Surgeon: Conrad Green Valley, MD;  Location: [facility];  Service: Cardiovascular;  Laterality: N/A;  bilateral   . UMBILICAL HERNIA REPAIR      Social History   Socioeconomic History  . Marital status: Married    Spouse name: [name]  . Number of children: 2  . Years of education: hs gr  . Highest education level: Not on file  Occupational History  . Occupation: retired    Comment: Adjuster  Tobacco Use  . Smoking status: Former Smoker    Packs/day: 1.00  . Smokeless tobacco: Never Used  . Tobacco  comment: Quit 20 years ago  Substance and Sexual Activity  . Alcohol use: No    Alcohol/week: 0.0 standard drinks  . Drug use: No  . Sexual activity: Not Currently    Partners: Female    Birth control/protection: None  Other Topics Concern  . Not on file  Social History Narrative   Patient is married with 2 children.   Patient is right handed.   Patient has hs education.   Patient drinks tea on sundays, sodas occ.   Social Determinants of Health   Financial Resource Strain:   . Difficulty of Paying Living Expenses:   Food Insecurity:   . Worried About Charity fundraiser in the Last Year:   . Arboriculturist in the Last Year:   Transportation Needs:   .  Lack of Transportation (Medical):   Marland Kitchen Lack of Transportation (Non-Medical):   Physical Activity:   . Days of Exercise per Week:   . Minutes of Exercise per Session:   Stress:   . Feeling of Stress :   Social Connections:   . Frequency of Communication with Friends and Family:   . Frequency of Social Gatherings with Friends and Family:   . Attends Religious Services:   . Active Member of Clubs or Organizations:   . Attends Archivist Meetings:   Marland Kitchen Marital Status:   Intimate Partner Violence:   . Fear of Current or Ex-Partner:   . Emotionally Abused:   Marland Kitchen Physically Abused:   . Sexually Abused:      Vitals:   01/10/20 1332  BP: (!) 140/56  Pulse: (!) 44  Temp: 98.4 F (36.9 C)  SpO2: 96%  Weight: 279 lb (126.6 kg)  Height: 5\' 7"  (1.702 m)    Wt Readings from Last 3 Encounters:  01/10/20 279 lb (126.6 kg)  01/04/20 277 lb 4 oz (125.8 kg)  06/01/19 263 lb (119.3 kg)     PHYSICAL EXAM General: Morbidly obese male in NAD HEENT: Normal. Neck: No JVD, no thyromegaly. Lungs: Clear to auscultation bilaterally with normal respiratory effort. CV: Bradycardic, regular rhythm, normal S1/S2, no S3/S4, no murmur. No pretibial or periankle edema.   Abdomen: Soft, nontender, obese.  Neurologic: Alert and  oriented.  Psych: Normal affect. Skin: Normal. Musculoskeletal: No gross deformities.      Labs: Lab Results  Component Value Date/Time   K 3.8 05/31/2018 04:43 AM   BUN 14 05/31/2018 04:43 AM   CREATININE 1.11 05/31/2018 04:43 AM   ALT 17 05/26/2018 09:21 AM   HGB 10.3 (L) 05/31/2018 04:43 AM     Lipids: Lab Results  Component Value Date/Time   LDLCALC 91 10/26/2014 02:40 AM   CHOL 138 10/26/2014 02:40 AM   TRIG 91 10/26/2014 02:40 AM   HDL 29 (L) 10/26/2014 02:40 AM       ASSESSMENT AND PLAN:  1. Coronary artery disease:Symptomatically stable. He had anabnormal stress test in June 2019 which was low risk but did show prior inferoseptal myocardial infarction with mild peri-infarct ischemia. He is on aspirin, clopidogrel,and atorvastatin. I previously stopped metoprolol due to bradycardia. Nuclear stress test reviewed above which was low risk. Echocardiogram demonstrated normal left ventricular systolic function and regional wall motion. He does have some chronic exertional dyspnea which is likely due to morbid obesity and bradycardia.  2. Hypertension: BP is mildly elevated.  No changes.  3. Hyperlipidemia: Currently on atorvastatin 80 mg and Zetia. I will obtain a copy of lipids from PCP.  4. History of CVA: Currently on aspirin, clopidogrel, and atorvastatin 80 mg.  5. Peripheral vascular disease: Followed by vascular surgery. On aspirin, clopidogrel, and atorvastatin.  6.  Bradycardia: He has some chronic exertional dyspnea which is stable.  He denies dizziness and syncope.  I informed him that should he develop progressive symptomatic bradycardia that a pacemaker would be indicated.  I will obtain a 2-week event monitor.   Disposition: Follow up 6 months   Kate Sable, M.D., F.A.C.C.

## 2020-01-31 ENCOUNTER — Other Ambulatory Visit: Payer: Self-pay

## 2020-03-26 ENCOUNTER — Encounter: Payer: Self-pay | Admitting: Podiatry

## 2020-03-26 ENCOUNTER — Other Ambulatory Visit: Payer: Self-pay

## 2020-03-26 ENCOUNTER — Ambulatory Visit: Payer: Medicare Other | Admitting: Podiatry

## 2020-03-26 DIAGNOSIS — L84 Corns and callosities: Secondary | ICD-10-CM | POA: Diagnosis not present

## 2020-03-26 DIAGNOSIS — B351 Tinea unguium: Secondary | ICD-10-CM

## 2020-03-26 DIAGNOSIS — M79674 Pain in right toe(s): Secondary | ICD-10-CM

## 2020-03-26 DIAGNOSIS — M79675 Pain in left toe(s): Secondary | ICD-10-CM

## 2020-03-26 DIAGNOSIS — E1151 Type 2 diabetes mellitus with diabetic peripheral angiopathy without gangrene: Secondary | ICD-10-CM

## 2020-03-26 NOTE — Patient Instructions (Signed)

## 2020-03-31 NOTE — Progress Notes (Signed)
Subjective: Jacob Avery presents today at risk foot care. Pt has h/o NIDDM with PAD and painful corn(s) b/l 5th digit and callus(es) right hallux and painful mycotic nails b/l.  Pain interferes with ambulation. Aggravating factors include wearing enclosed shoe gear.  [name], Fransico Him, MD is patient's PCP.  Past Medical History:  Diagnosis Date  . Arthritis   . Chronic kidney disease    Stage 2  . CVA (cerebral vascular accident) ([place]) 10/2014    Late effect of cerebrovascular disease.  Mostly resolved, but can see difference in facial expressions.  tp-A, Plavix afterward  . Diabetes mellitus without complication ([place])    type II, controlled, with renal comps  . Diabetic foot ([place]) 04/05/2018   Normal sensation, skin intact  . Diverticulosis   . ED (erectile dysfunction)   . GI bleed 04/2017  . Gout   . History of claustrophobia    with MRI  . Hx of adenomatous polyp of rectum 06/10/2017  . Hypercholesterolemia   . Hypertension   . Long term (current) use of insulin ([place])    Remains on basal/bolus therapy without hypoglycemia.  . Microalbuminuria 2013  . Morbid obesity ([place])   . Obesity   . OSA (obstructive sleep apnea)    Intolerant to CPAP  . Peripheral vascular disease ([place])   . Right calf pain 04/05/2018  . Vertigo      Current Outpatient Medications on File Prior to Visit  Medication Sig Dispense Refill  . Accu-Chek FastClix Lancets MISC     . ACCU-CHEK GUIDE test strip USE STRIP TO CHECK GLUCOSE THREE TIMES DAILY    . acetaminophen (TYLENOL ARTHRITIS PAIN) 650 MG CR tablet Take 1,300 mg by mouth 2 (two) times daily as needed for pain.     Marland Kitchen amLODipine (NORVASC) 10 MG tablet Take 10 mg by mouth every evening.     Marland Kitchen aspirin EC 81 MG tablet Take 81 mg by mouth daily.    Marland Kitchen atorvastatin (LIPITOR) 80 MG tablet Take 80 mg by mouth daily.    . clopidogrel (PLAVIX) 75 MG tablet Take 75 mg by mouth daily.     . colchicine (COLCRYS) 0.6 MG tablet Take 0.6 mg by mouth daily  as needed (for gout flare ups.).     Marland Kitchen ezetimibe (ZETIA) 10 MG tablet Take 10 mg by mouth daily.    . ferrous sulfate 325 (65 FE) MG tablet Take 325 mg by mouth daily with breakfast.    . hydrocortisone (ANUSOL-HC) 2.5 % rectal cream 1 application to the rectum at bedtime for 10 days 28 g 0  . insulin lispro (HUMALOG) 100 UNIT/ML KiwkPen Inject 10 Units into the skin 2 (two) times daily before a meal.     . LANTUS SOLOSTAR 100 UNIT/ML Solostar Pen Inject 30-40 Units into the skin See admin instructions. Inject 40 units subcutaneously in the morning, and 30 units subcutaneously in the evening    . losartan-hydrochlorothiazide (HYZAAR) 100-25 MG per tablet Take 1 tablet by mouth daily.    . metoprolol tartrate (LOPRESSOR) 50 MG tablet Take 50 mg by mouth 2 (two) times daily.    . nitroGLYCERIN (NITROSTAT) 0.4 MG SL tablet Place 1 tablet (0.4 mg total) under the tongue every 5 (five) minutes as needed for chest pain. 25 tablet 3  . omeprazole (PRILOSEC) 20 MG capsule Take 20 mg by mouth daily.    Marland Kitchen OVER THE COUNTER MEDICATION Apply 1 application topically daily as needed (pain). Two Old Goats otc muscle  rub    . oxyCODONE-acetaminophen (PERCOCET/ROXICET) 5-325 MG tablet Take 1 tablet by mouth every 6 (six) hours as needed for moderate pain. 20 tablet 0  . Oxymetazoline HCl (VICKS SINEX 12 HOUR NA) Place 1 spray into both nostrils daily as needed (congestion).    . tamsulosin (FLOMAX) 0.4 MG CAPS capsule      No current facility-administered medications on file prior to visit.     No Known Allergies  Objective: Jacob Avery is a pleasant 74 y.o. y.o. Patient Race: Black or African American [2]  male in NAD. AAO x 3.  There were no vitals filed for this visit.  Vascular Examination: Capillary refill time to digits immediate b/l. Nonpalpable DP pulse(s) b/l lower extremities. Nonpalpable PT pulse(s) b/l lower extremities. Pedal hair absent b/l Skin temperature gradient within normal limits  b/l.  Dermatological Examination: Pedal skin with normal turgor, texture and tone bilaterally. No open wounds bilaterally. No interdigital macerations bilaterally. Toenails 1-5 b/l elongated, discolored, dystrophic, thickened, crumbly with subungual debris and tenderness to dorsal palpation. Hyperkeratotic lesion(s) L 5th toe, R hallux and R 5th toe.  No erythema, no edema, no drainage, no flocculence.  Musculoskeletal: Normal muscle strength 5/5 to all lower extremity muscle groups bilaterally. No pain crepitus or joint limitation noted with ROM b/l. Hallux valgus with bunion deformity noted b/l lower extremities. Hammertoes noted to the 2-5 bilaterally.  Neurological Examination: Protective sensation intact 5/5 intact bilaterally with 10g monofilament b/l. Vibratory sensation intact b/l.  Assessment: 1. Pain due to onychomycosis of toenails of both feet   2. Corns and callosities   3. Type II diabetes mellitus with peripheral circulatory disorder (HCC)   Plan: -Examined patient. -No new findings. No new orders. -Continue diabetic foot care principles. Literature dispensed on today.  -Toenails 1-5 b/l were debrided in length and girth with sterile nail nippers and dremel without iatrogenic bleeding.  -Corn(s) L 5th toe and R 5th toe and callus(es) R hallux were pared utilizing sterile scalpel blade without incident. Total number debrided =3. -Patient to continue soft, supportive shoe gear daily. -Patient to report any pedal injuries to medical professional immediately. -Patient/POA to call should there be question/concern in the interim.  Return in about 3 months (around 06/26/2020) for diabetic nail and callus trim.  Marzetta Board, DPM

## 2020-05-29 NOTE — Progress Notes (Signed)
CARDIOLOGY CONSULT NOTE       Patient ID: Jacob Avery MRN: [id number] DOB/AGE: [birth year] 74 y.o.  Referring Physician: [name] Primary Physician: [name], MD Primary Cardiologist: Previously [name] Reason for Consultation: PVD/Bradycardia   Active Problems:   * No active hospital problems. *   HPI:  74 y.o. previously seen by Dr [name] and referred by DR [facility] for ongoing evaluation of patients PVD, CAD, HTN, and HLD.and DM  He has not had a heart cath Presumed CAD based on abnormal myovue done June 2019 showing inferoseptal MI with mild peri infarct ischemia. CRF;s include obesity, HTN, HLD non smoker. He is followed by [facility] having had a left femoral endarterectomy and left axillobifemoral bypass by Dr [name] in July 2019 He has chronic exertional dyspnea and fatigue likely from his obesity Echo in September 2019 showed EF 60-65% with no significant valve disease His last duplex 05/28/19 showed possible 50-74% in inflow portion of the left limb of his graft   He is not on beta blockers and has chronic bradycardia. Monitor 01/31/20 showed no high grade AV block with average HR of 48 bpm   No claudication Weight up and not very active Discussed diet at length. Has had vaccine HR in 50's today with no pre syncope   ROS All other systems reviewed and negative except as noted above  Past Medical History:  Diagnosis Date  . Arthritis   . Chronic kidney disease    Stage 2  . CVA (cerebral vascular accident) ([place]) 10/2014    Late effect of cerebrovascular disease.  Mostly resolved, but can see difference in facial expressions.  tp-A, Plavix afterward  . Diabetes mellitus without complication ([place])    type II, controlled, with renal comps  . Diabetic foot ([place]) 04/05/2018   Normal sensation, skin intact  . Diverticulosis   . ED (erectile dysfunction)   . GI bleed 04/2017  . Gout   . History of claustrophobia    with MRI  . Hx of adenomatous polyp of rectum  06/10/2017  . Hypercholesterolemia   . Hypertension   . Long term (current) use of insulin ([place])    Remains on basal/bolus therapy without hypoglycemia.  . Microalbuminuria 2013  . Morbid obesity ([place])   . Obesity   . OSA (obstructive sleep apnea)    Intolerant to CPAP  . Peripheral vascular disease ([place])   . Right calf pain 04/05/2018  . Vertigo     Family History  Problem Relation Age of Onset  . CVA Mother   . Alzheimer's disease Mother   . Heart attack Father   . Heart disease Father   . Diabetes Brother   . Rectal cancer Brother   . Stomach cancer Neg Hx   . Colon cancer Neg Hx     Social History   Socioeconomic History  . Marital status: Married    Spouse name: [name]  . Number of children: 2  . Years of education: hs gr  . Highest education level: Not on file  Occupational History  . Occupation: retired    Comment: Adjuster  Tobacco Use  . Smoking status: Former Smoker    Packs/day: 1.00  . Smokeless tobacco: Never Used  . Tobacco comment: Quit 20 years ago  Vaping Use  . Vaping Use: Never used  Substance and Sexual Activity  . Alcohol use: No    Alcohol/week: 0.0 standard drinks  . Drug use: No  . Sexual activity: Not Currently  Partners: Female    Birth control/protection: None  Other Topics Concern  . Not on file  Social History Narrative   Patient is married with 2 children.   Patient is right handed.   Patient has hs education.   Patient drinks tea on sundays, sodas occ.   Social Determinants of Health   Financial Resource Strain:   . Difficulty of Paying Living Expenses:   Food Insecurity:   . Worried About Charity fundraiser in the Last Year:   . Arboriculturist in the Last Year:   Transportation Needs:   . Film/video editor (Medical):   Marland Kitchen Lack of Transportation (Non-Medical):   Physical Activity:   . Days of Exercise per Week:   . Minutes of Exercise per Session:   Stress:   . Feeling of Stress :   Social Connections:   .  Frequency of Communication with Friends and Family:   . Frequency of Social Gatherings with Friends and Family:   . Attends Religious Services:   . Active Member of Clubs or Organizations:   . Attends Archivist Meetings:   Marland Kitchen Marital Status:   Intimate Partner Violence:   . Fear of Current or Ex-Partner:   . Emotionally Abused:   Marland Kitchen Physically Abused:   . Sexually Abused:     Past Surgical History:  Procedure Laterality Date  . ABDOMINAL AORTOGRAM N/A 04/13/2018   Procedure: ABDOMINAL AORTOGRAM;  Surgeon: Conrad Clover Creek, MD;  Location: [facility];  Service: Cardiovascular;  Laterality: N/A;  . APPLICATION OF WOUND VAC Bilateral 05/30/2018   Procedure: APPLICATION OF WOUND VAC;  Surgeon: Conrad Tatum, MD;  Location: [facility];  Service: Vascular;  Laterality: Bilateral;  . AXILLARY-FEMORAL BYPASS GRAFT Left 05/30/2018   Procedure: BYPASS GRAFT LEFT AXILLA-BIFEMORAL WITH HEMO SHIELD GOLD VASCULAR GRAFTS;  Surgeon: Conrad Willisburg, MD;  Location: [facility];  Service: Vascular;  Laterality: Left;  . COLONOSCOPY WITH PROPOFOL N/A 06/08/2017   Procedure: COLONOSCOPY WITH PROPOFOL ( Ultra-Slim Scope);  Surgeon: Gatha Mayer, MD;  Location: [facility];  Service: Endoscopy;  Laterality: N/A;  . ENDARTERECTOMY FEMORAL Bilateral 05/30/2018   Procedure: ENDARTERECTOMY FEMORAL LEFT;  Surgeon: Conrad Red [place], MD;  Location: [facility];  Service: Vascular;  Laterality: Bilateral;  . INTRAOPERATIVE ARTERIOGRAM Bilateral 05/30/2018   Procedure: INTRA OPERATIVE ARTERIOGRAM BILATERAL LOWER EXTREMITIES;  Surgeon: Conrad Cimarron, MD;  Location: [facility];  Service: Vascular;  Laterality: Bilateral;  . LOWER EXTREMITY ANGIOGRAPHY N/A 04/13/2018   Procedure: LOWER EXTREMITY ANGIOGRAPHY;  Surgeon: Conrad Tioga, MD;  Location: [facility];  Service: Cardiovascular;  Laterality: N/A;  bilateral   . UMBILICAL HERNIA REPAIR        Current Outpatient Medications:  .  Accu-Chek FastClix Lancets MISC, , Disp: ,  Rfl:  .  ACCU-CHEK GUIDE test strip, USE STRIP TO CHECK GLUCOSE THREE TIMES DAILY, Disp: , Rfl:  .  acetaminophen (TYLENOL ARTHRITIS PAIN) 650 MG CR tablet, Take 1,300 mg by mouth 2 (two) times daily as needed for pain. , Disp: , Rfl:  .  amLODipine (NORVASC) 10 MG tablet, Take 10 mg by mouth every evening. , Disp: , Rfl:  .  aspirin EC 81 MG tablet, Take 81 mg by mouth daily., Disp: , Rfl:  .  atorvastatin (LIPITOR) 80 MG tablet, Take 80 mg by mouth daily., Disp: , Rfl:  .  clopidogrel (PLAVIX) 75 MG tablet, Take 75 mg by mouth daily. ,  Disp: , Rfl:  .  colchicine (COLCRYS) 0.6 MG tablet, Take 0.6 mg by mouth daily as needed (for gout flare ups.). , Disp: , Rfl:  .  ezetimibe (ZETIA) 10 MG tablet, Take 10 mg by mouth daily., Disp: , Rfl:  .  ferrous sulfate 325 (65 FE) MG tablet, Take 325 mg by mouth daily with breakfast., Disp: , Rfl:  .  hydrocortisone (ANUSOL-HC) 2.5 % rectal cream, 1 application to the rectum at bedtime for 10 days, Disp: 28 g, Rfl: 0 .  insulin lispro (HUMALOG) 100 UNIT/ML KiwkPen, Inject 10 Units into the skin 2 (two) times daily before a meal. , Disp: , Rfl:  .  LANTUS SOLOSTAR 100 UNIT/ML Solostar Pen, Inject 30-40 Units into the skin See admin instructions. Inject 40 units subcutaneously in the morning, and 30 units subcutaneously in the evening, Disp: , Rfl:  .  losartan-hydrochlorothiazide (HYZAAR) 100-25 MG per tablet, Take 1 tablet by mouth daily., Disp: , Rfl:  .  metoprolol tartrate (LOPRESSOR) 50 MG tablet, Take 50 mg by mouth 2 (two) times daily., Disp: , Rfl:  .  nitroGLYCERIN (NITROSTAT) 0.4 MG SL tablet, Place 1 tablet (0.4 mg total) under the tongue every 5 (five) minutes as needed for chest pain., Disp: 25 tablet, Rfl: 3 .  omeprazole (PRILOSEC) 20 MG capsule, Take 20 mg by mouth daily., Disp: , Rfl:  .  OVER THE COUNTER MEDICATION, Apply 1 application topically daily as needed (pain). Two Old Goats otc muscle rub, Disp: , Rfl:  .  oxyCODONE-acetaminophen  (PERCOCET/ROXICET) 5-325 MG tablet, Take 1 tablet by mouth every 6 (six) hours as needed for moderate pain., Disp: 20 tablet, Rfl: 0 .  Oxymetazoline HCl (VICKS SINEX 12 HOUR NA), Place 1 spray into both nostrils daily as needed (congestion)., Disp: , Rfl:  .  tamsulosin (FLOMAX) 0.4 MG CAPS capsule, , Disp: , Rfl:     Physical Exam: There were no vitals taken for this visit.   Affect appropriate Obese black male  HEENT: normal Neck supple with no adenopathy JVP normal no bruits no thyromegaly Lungs clear with no wheezing and good diaphragmatic motion Heart:  S1/S2 no murmur, no rub, gallop or click PMI normal Abdomen: benighn, BS positve, no tenderness, no AAA no bruit.  No HSM or HJR Distal pulses intact with no bruits Neuro non-focal Skin warm and dry Post left axillary grafting with fem fem and decreased peripheral pulses    Labs:   Lab Results  Component Value Date   WBC 8.8 05/31/2018   HGB 10.3 (L) 05/31/2018   HCT 33.7 (L) 05/31/2018   MCV 79.5 05/31/2018   PLT 242 05/31/2018   No results for input(s): NA, K, CL, CO2, BUN, CREATININE, CALCIUM, PROT, BILITOT, ALKPHOS, ALT, AST, GLUCOSE in the last 168 hours.  Invalid input(s): LABALBU No results found for: CKTOTAL, CKMB, CKMBINDEX, TROPONINI  Lab Results  Component Value Date   CHOL 138 10/26/2014   Lab Results  Component Value Date   HDL 29 (L) 10/26/2014   Lab Results  Component Value Date   LDLCALC 91 10/26/2014   Lab Results  Component Value Date   TRIG 91 10/26/2014   Lab Results  Component Value Date   CHOLHDL 4.8 10/26/2014   No results found for: LDLDIRECT    Radiology: No results found.  EKG: SB rate 44 no AV block 01/10/20    ASSESSMENT AND PLAN:   1. CAD:  Presumed based on abnormal myovue in 2019 no chest  pain observe   2. PVD: post Ax-Fem with abnormal surveillance f/u US done 05/28/19 Needs f/u with [facility] will arrange  3. HTN:  Well controlled.  Continue current medications and  low sodium Dash type diet.    4. HLD:  Continue statin labs with primary   5. DM:  Discussed low carb diet.  Target hemoglobin A1c is 6.5 or less.  Continue current medications.  6. Bradycardia:  Stable rates in 50's no symptoms avoid AV nodal drugs   Signed: Jenkins Rouge 05/29/2020, 4:21 PM

## 2020-06-10 ENCOUNTER — Ambulatory Visit (INDEPENDENT_AMBULATORY_CARE_PROVIDER_SITE_OTHER): Payer: Medicare Other | Admitting: Cardiovascular Disease

## 2020-06-10 ENCOUNTER — Other Ambulatory Visit: Payer: Self-pay

## 2020-06-10 ENCOUNTER — Encounter: Payer: Self-pay | Admitting: Cardiovascular Disease

## 2020-06-10 VITALS — BP 132/62 | HR 52 | Ht 67.0 in | Wt 282.0 lb

## 2020-06-10 DIAGNOSIS — I739 Peripheral vascular disease, unspecified: Secondary | ICD-10-CM | POA: Diagnosis not present

## 2020-06-10 DIAGNOSIS — I2510 Atherosclerotic heart disease of native coronary artery without angina pectoris: Secondary | ICD-10-CM

## 2020-06-10 DIAGNOSIS — R001 Bradycardia, unspecified: Secondary | ICD-10-CM

## 2020-06-10 NOTE — Patient Instructions (Signed)
Medication Instructions:  Your physician recommends that you continue on your current medications as directed. Please refer to the Current Medication list given to you today.  *If you need a refill on your cardiac medications before your next appointment, please call your pharmacy*   Lab Work: None today If you have labs (blood work) drawn today and your tests are completely normal, you will receive your results only by: . MyChart Message (if you have MyChart) OR . A paper copy in the mail If you have any lab test that is abnormal or we need to change your treatment, we will call you to review the results.   Testing/Procedures: None today   Follow-Up: At [facility], you and your health needs are our priority.  As part of our continuing mission to provide you with exceptional heart care, we have created designated Provider Care Teams.  These Care Teams include your primary Cardiologist (physician) and Advanced Practice Providers (APPs -  Physician Assistants and Nurse Practitioners) who all work together to provide you with the care you need, when you need it.  We recommend signing up for the patient portal called "MyChart".  Sign up information is provided on this After Visit Summary.  MyChart is used to connect with patients for Virtual Visits (Telemedicine).  Patients are able to view lab/test results, encounter notes, upcoming appointments, etc.  Non-urgent messages can be sent to your provider as well.   To learn more about what you can do with MyChart, go to [email or web address].    Your next appointment:   12 month(s)  The format for your next appointment:   In Person  Provider:   Dr.Peter Nishan  Other Instructions None    Thank you for choosing [facility] !         

## 2020-06-20 ENCOUNTER — Ambulatory Visit: Payer: Medicare Other | Admitting: Cardiovascular Disease

## 2020-06-27 ENCOUNTER — Encounter: Payer: Self-pay | Admitting: Podiatry

## 2020-06-27 ENCOUNTER — Ambulatory Visit: Payer: Medicare Other | Admitting: Podiatry

## 2020-06-27 ENCOUNTER — Other Ambulatory Visit: Payer: Self-pay

## 2020-06-27 DIAGNOSIS — M79675 Pain in left toe(s): Secondary | ICD-10-CM

## 2020-06-27 DIAGNOSIS — B351 Tinea unguium: Secondary | ICD-10-CM | POA: Diagnosis not present

## 2020-06-27 DIAGNOSIS — E1151 Type 2 diabetes mellitus with diabetic peripheral angiopathy without gangrene: Secondary | ICD-10-CM

## 2020-06-27 DIAGNOSIS — M79674 Pain in right toe(s): Secondary | ICD-10-CM

## 2020-06-27 DIAGNOSIS — L84 Corns and callosities: Secondary | ICD-10-CM | POA: Diagnosis not present

## 2020-06-29 NOTE — Progress Notes (Signed)
Subjective: Jacob Avery presents today at risk foot care. Pt has h/o NIDDM with PAD and painful corn(s) b/l 5th digit and callus(es) right hallux and painful mycotic nails b/l.  Pain interferes with ambulation. Aggravating factors include wearing enclosed shoe gear.   He relates no new pedal problems on today's visit.  [name], Fransico Him, MD is patient's PCP.  Past Medical History:  Diagnosis Date  . Arthritis   . Chronic kidney disease    Stage 2  . CVA (cerebral vascular accident) ([place]) 10/2014    Late effect of cerebrovascular disease.  Mostly resolved, but can see difference in facial expressions.  tp-A, Plavix afterward  . Diabetes mellitus without complication ([place])    type II, controlled, with renal comps  . Diabetic foot ([place]) 04/05/2018   Normal sensation, skin intact  . Diverticulosis   . ED (erectile dysfunction)   . GI bleed 04/2017  . Gout   . History of claustrophobia    with MRI  . Hx of adenomatous polyp of rectum 06/10/2017  . Hypercholesterolemia   . Hypertension   . Long term (current) use of insulin ([place])    Remains on basal/bolus therapy without hypoglycemia.  . Microalbuminuria 2013  . Morbid obesity ([place])   . Obesity   . OSA (obstructive sleep apnea)    Intolerant to CPAP  . Peripheral vascular disease ([place])   . Right calf pain 04/05/2018  . Vertigo      Current Outpatient Medications on File Prior to Visit  Medication Sig Dispense Refill  . Accu-Chek FastClix Lancets MISC     . ACCU-CHEK GUIDE test strip USE STRIP TO CHECK GLUCOSE THREE TIMES DAILY    . acetaminophen (TYLENOL ARTHRITIS PAIN) 650 MG CR tablet Take 1,300 mg by mouth 2 (two) times daily as needed for pain.     Marland Kitchen amLODipine (NORVASC) 10 MG tablet Take 10 mg by mouth every evening.     Marland Kitchen aspirin EC 81 MG tablet Take 81 mg by mouth daily.    Marland Kitchen atorvastatin (LIPITOR) 80 MG tablet Take 80 mg by mouth daily.    . clopidogrel (PLAVIX) 75 MG tablet Take 75 mg by mouth daily.     .  colchicine (COLCRYS) 0.6 MG tablet Take 0.6 mg by mouth daily as needed (for gout flare ups.).     Marland Kitchen ezetimibe (ZETIA) 10 MG tablet Take 10 mg by mouth daily.    . ferrous sulfate 325 (65 FE) MG tablet Take 325 mg by mouth daily with breakfast.    . insulin lispro (HUMALOG) 100 UNIT/ML KiwkPen Inject 10 Units into the skin 2 (two) times daily before a meal.     . LANTUS SOLOSTAR 100 UNIT/ML Solostar Pen Inject 30-40 Units into the skin See admin instructions. Inject 40 units subcutaneously in the morning, and 30 units subcutaneously in the evening    . losartan-hydrochlorothiazide (HYZAAR) 100-25 MG per tablet Take 1 tablet by mouth daily.    . metoprolol tartrate (LOPRESSOR) 50 MG tablet Take 50 mg by mouth 2 (two) times daily.    . nitroGLYCERIN (NITROSTAT) 0.4 MG SL tablet Place 1 tablet (0.4 mg total) under the tongue every 5 (five) minutes as needed for chest pain. 25 tablet 3  . omeprazole (PRILOSEC) 20 MG capsule Take 20 mg by mouth daily.    Marland Kitchen OVER THE COUNTER MEDICATION Apply 1 application topically daily as needed (pain). Two Old Goats otc muscle rub    . oxyCODONE-acetaminophen (PERCOCET/ROXICET) 5-325 MG tablet  Take 1 tablet by mouth every 6 (six) hours as needed for moderate pain. 20 tablet 0  . Oxymetazoline HCl (VICKS SINEX 12 HOUR NA) Place 1 spray into both nostrils daily as needed (congestion).    . tamsulosin (FLOMAX) 0.4 MG CAPS capsule Take 0.4 mg by mouth daily.      No current facility-administered medications on file prior to visit.     No Known Allergies  Objective: Jacob Avery is a pleasant 74 y.o. y.o. Patient Race: Black or African American [2]  male in NAD. AAO x 3.  There were no vitals filed for this visit.  Vascular Examination: Capillary refill time to digits immediate b/l. Nonpalpable DP pulse(s) b/l lower extremities. Nonpalpable PT pulse(s) b/l lower extremities. Pedal hair absent b/l Skin temperature gradient within normal limits b/l.  Dermatological  Examination: Pedal skin with normal turgor, texture and tone bilaterally. No open wounds bilaterally. No interdigital macerations bilaterally. Toenails 1-5 b/l elongated, discolored, dystrophic, thickened, crumbly with subungual debris and tenderness to dorsal palpation. Hyperkeratotic lesion(s) L 5th toe, R hallux and R 5th toe.  No erythema, no edema, no drainage, no flocculence.  Musculoskeletal: Normal muscle strength 5/5 to all lower extremity muscle groups bilaterally. No pain crepitus or joint limitation noted with ROM b/l. Hallux valgus with bunion deformity noted b/l lower extremities. Hammertoes noted to the 2-5 bilaterally.  Neurological Examination: Protective sensation intact 5/5 intact bilaterally with 10g monofilament b/l. Vibratory sensation intact b/l.  Assessment: 1. Pain due to onychomycosis of toenails of both feet   2. Corns and callosities   3. Type II diabetes mellitus with peripheral circulatory disorder (HCC)     Plan: -Examined patient. -No new findings. No new orders. -Continue diabetic foot care principles. -Toenails 1-5 b/l were debrided in length and girth with sterile nail nippers and dremel without iatrogenic bleeding.  -Corn(s) L 5th toe and R 5th toe and callus(es) R hallux were pared utilizing sterile scalpel blade without incident. Total number debrided =3. -Patient to report any pedal injuries to medical professional immediately. -Patient to continue soft, supportive shoe gear daily. -Patient/POA to call should there be question/concern in the interim.  Return in about 3 months (around 09/27/2020) for diabetic nail and callus trim.  Marzetta Board, DPM

## 2020-10-10 ENCOUNTER — Other Ambulatory Visit: Payer: Self-pay

## 2020-10-10 ENCOUNTER — Encounter: Payer: Self-pay | Admitting: Podiatry

## 2020-10-10 ENCOUNTER — Ambulatory Visit: Payer: Medicare Other | Admitting: Podiatry

## 2020-10-10 DIAGNOSIS — M2011 Hallux valgus (acquired), right foot: Secondary | ICD-10-CM | POA: Diagnosis not present

## 2020-10-10 DIAGNOSIS — E119 Type 2 diabetes mellitus without complications: Secondary | ICD-10-CM

## 2020-10-10 DIAGNOSIS — M79675 Pain in left toe(s): Secondary | ICD-10-CM | POA: Diagnosis not present

## 2020-10-10 DIAGNOSIS — L84 Corns and callosities: Secondary | ICD-10-CM | POA: Diagnosis not present

## 2020-10-10 DIAGNOSIS — E1151 Type 2 diabetes mellitus with diabetic peripheral angiopathy without gangrene: Secondary | ICD-10-CM

## 2020-10-10 DIAGNOSIS — B351 Tinea unguium: Secondary | ICD-10-CM

## 2020-10-10 DIAGNOSIS — M79674 Pain in right toe(s): Secondary | ICD-10-CM | POA: Diagnosis not present

## 2020-10-10 DIAGNOSIS — M2041 Other hammer toe(s) (acquired), right foot: Secondary | ICD-10-CM

## 2020-10-10 DIAGNOSIS — M2042 Other hammer toe(s) (acquired), left foot: Secondary | ICD-10-CM

## 2020-10-10 DIAGNOSIS — M2012 Hallux valgus (acquired), left foot: Secondary | ICD-10-CM

## 2020-10-16 NOTE — Progress Notes (Signed)
ANNUAL DIABETIC FOOT EXAM  Subjective: Jacob Avery presents today for for annual diabetic foot examination, at risk foot care. Pt has h/o NIDDM with PAD and corn(s) b/l feet , callus(es) right hallux and painful mycotic nails.  Pain interferes with ambulation. Aggravating factors include wearing enclosed shoe gear. Painful toenails interfere with ambulation. Aggravating factors include wearing enclosed shoe gear. Pain is relieved with periodic professional debridement. Painful corns and calluses are aggravated when weightbearing with and without shoegear. Pain is relieved with periodic professional debridement..  Patient relates 15-20 year h/o diabetes.  Patient denies any h/o foot wounds.  Patient denies symptoms of foot numbness.  Patient denies symptoms of foot tingling.  Patient denies symptoms of burning in feet.  Patient's blood sugar was 124 mg/dl this morning.   [name], Fransico Him, MD is patient's PCP.   Past Medical History:  Diagnosis Date  . Arthritis   . Chronic kidney disease    Stage 2  . CVA (cerebral vascular accident) ([place]) 10/2014    Late effect of cerebrovascular disease.  Mostly resolved, but can see difference in facial expressions.  tp-A, Plavix afterward  . Diabetes mellitus without complication ([place])    type II, controlled, with renal comps  . Diabetic foot ([place]) 04/05/2018   Normal sensation, skin intact  . Diverticulosis   . ED (erectile dysfunction)   . GI bleed 04/2017  . Gout   . History of claustrophobia    with MRI  . Hx of adenomatous polyp of rectum 06/10/2017  . Hypercholesterolemia   . Hypertension   . Long term (current) use of insulin ([place])    Remains on basal/bolus therapy without hypoglycemia.  . Microalbuminuria 2013  . Morbid obesity ([place])   . Obesity   . OSA (obstructive sleep apnea)    Intolerant to CPAP  . Peripheral vascular disease ([place])   . Right calf pain 04/05/2018  . Vertigo    Patient Active Problem List   Diagnosis  Date Noted  . PAD (peripheral artery disease) ([place]) 05/30/2018  . Critical lower limb ischemia ([place]) 04/10/2018  . Hx of adenomatous polyp of rectum 06/10/2017  . HLD (hyperlipidemia) 01/07/2015  . Obstructive sleep apnea 01/07/2015  . Sleep apnea 12/11/2014  . Snoring 12/11/2014  . Severe obesity (BMI >= 40) ([place]) 12/11/2014  . Embolic stroke involving right carotid artery ([place]) 12/11/2014  . Palpitations 10/29/2014  . Carotid stenosis   . Essential hypertension 10/28/2014  . Hyperlipidemia LDL goal <70 10/28/2014  . Morbid obesity ([place]) 10/28/2014  . Diabetes ([place]) 10/28/2014  . likely OSA (obstructive sleep apnea) 10/28/2014  . Cerebral infarction due to occlusion or stenosis of precerebral artery ([place]) 10/25/2014  . OTHER SPECIFIED INTESTINAL OBSTRUCTION 12/22/2009  . DIVERTICULOSIS-COLON 12/22/2009   Past Surgical History:  Procedure Laterality Date  . ABDOMINAL AORTOGRAM N/A 04/13/2018   Procedure: ABDOMINAL AORTOGRAM;  Surgeon: Conrad Hanover, MD;  Location: [facility];  Service: Cardiovascular;  Laterality: N/A;  . APPLICATION OF WOUND VAC Bilateral 05/30/2018   Procedure: APPLICATION OF WOUND VAC;  Surgeon: Conrad [place], MD;  Location: [facility];  Service: Vascular;  Laterality: Bilateral;  . AXILLARY-FEMORAL BYPASS GRAFT Left 05/30/2018   Procedure: BYPASS GRAFT LEFT AXILLA-BIFEMORAL WITH HEMO SHIELD GOLD VASCULAR GRAFTS;  Surgeon: Conrad [place], MD;  Location: [facility];  Service: Vascular;  Laterality: Left;  . COLONOSCOPY WITH PROPOFOL N/A 06/08/2017   Procedure: COLONOSCOPY WITH PROPOFOL ( Ultra-Slim Scope);  Surgeon: Gatha Mayer, MD;  Location: [facility];  Service: Endoscopy;  Laterality: N/A;  . ENDARTERECTOMY FEMORAL Bilateral 05/30/2018   Procedure: ENDARTERECTOMY FEMORAL LEFT;  Surgeon: Conrad [place], MD;  Location: [facility];  Service: Vascular;  Laterality: Bilateral;  . INTRAOPERATIVE ARTERIOGRAM Bilateral 05/30/2018   Procedure: INTRA OPERATIVE ARTERIOGRAM  BILATERAL LOWER EXTREMITIES;  Surgeon: Conrad Kittson, MD;  Location: [facility];  Service: Vascular;  Laterality: Bilateral;  . LOWER EXTREMITY ANGIOGRAPHY N/A 04/13/2018   Procedure: LOWER EXTREMITY ANGIOGRAPHY;  Surgeon: Conrad [place], MD;  Location: [facility];  Service: Cardiovascular;  Laterality: N/A;  bilateral   . UMBILICAL HERNIA REPAIR     Current Outpatient Medications on File Prior to Visit  Medication Sig Dispense Refill  . Accu-Chek FastClix Lancets MISC     . ACCU-CHEK GUIDE test strip USE STRIP TO CHECK GLUCOSE THREE TIMES DAILY    . acetaminophen (TYLENOL) 650 MG CR tablet Take 1,300 mg by mouth 2 (two) times daily as needed for pain.     Marland Kitchen amLODipine (NORVASC) 10 MG tablet Take 10 mg by mouth every evening.     Marland Kitchen aspirin EC 81 MG tablet Take 81 mg by mouth daily.    Marland Kitchen atorvastatin (LIPITOR) 80 MG tablet Take 80 mg by mouth daily.    . clopidogrel (PLAVIX) 75 MG tablet Take 75 mg by mouth daily.     . colchicine 0.6 MG tablet Take 0.6 mg by mouth daily as needed (for gout flare ups.).     Marland Kitchen ezetimibe (ZETIA) 10 MG tablet Take 10 mg by mouth daily.    . ferrous sulfate 325 (65 FE) MG tablet Take 325 mg by mouth daily with breakfast.    . HUMALOG KWIKPEN 100 UNIT/ML KwikPen Inject into the skin.    Marland Kitchen LANTUS SOLOSTAR 100 UNIT/ML Solostar Pen Inject 30-40 Units into the skin See admin instructions. Inject 40 units subcutaneously in the morning, and 30 units subcutaneously in the evening    . losartan-hydrochlorothiazide (HYZAAR) 100-25 MG per tablet Take 1 tablet by mouth daily.    . metoprolol tartrate (LOPRESSOR) 50 MG tablet Take 50 mg by mouth 2 (two) times daily.    . nitroGLYCERIN (NITROSTAT) 0.4 MG SL tablet Place 1 tablet (0.4 mg total) under the tongue every 5 (five) minutes as needed for chest pain. 25 tablet 3  . omeprazole (PRILOSEC) 20 MG capsule Take 20 mg by mouth daily.    Marland Kitchen OVER THE COUNTER MEDICATION Apply 1 application topically daily as needed (pain). Two  [place] otc muscle rub    . oxyCODONE-acetaminophen (PERCOCET/ROXICET) 5-325 MG tablet Take 1 tablet by mouth every 6 (six) hours as needed for moderate pain. 20 tablet 0  . Oxymetazoline HCl (VICKS SINEX 12 HOUR NA) Place 1 spray into both nostrils daily as needed (congestion).    . tamsulosin (FLOMAX) 0.4 MG CAPS capsule Take 0.4 mg by mouth daily.      No current facility-administered medications on file prior to visit.    No Known Allergies Social History   Occupational History  . Occupation: retired    Comment: Adjuster  Tobacco Use  . Smoking status: Former Smoker    Packs/day: 1.00  . Smokeless tobacco: Never Used  . Tobacco comment: Quit 20 years ago  Vaping Use  . Vaping Use: Never used  Substance and Sexual Activity  . Alcohol use: No    Alcohol/week: 0.0 standard drinks  . Drug use: No  . Sexual activity: Not Currently  Partners: Female    Birth control/protection: None   Family History  Problem Relation Age of Onset  . CVA Mother   . Alzheimer's disease Mother   . Heart attack Father   . Heart disease Father   . Diabetes Brother   . Rectal cancer Brother   . Stomach cancer Neg Hx   . Colon cancer Neg Hx    Immunization History  Administered Date(s) Administered  . Tdap 04/11/2019     Review of Systems: Negative except as noted in the HPI.  Objective: There were no vitals filed for this visit.  Jacob Avery is a pleasant 74 y.o. male in NAD. AAO X 3.   Vascular Examination: Capillary refill time to digits immediate b/l. Nonpalpable DP pulse(s) b/l lower extremities. Nonpalpable PT pulse(s) b/l lower extremities. Pedal hair absent b/l Skin temperature gradient within normal limits b/l.  Dermatological Examination: Pedal skin with normal turgor, texture and tone bilaterally. No open wounds bilaterally. No interdigital macerations bilaterally. Toenails 1-5 b/l elongated, discolored, dystrophic, thickened, crumbly with subungual debris and tenderness  to dorsal palpation. Hyperkeratotic lesion(s) L 5th toe, R hallux and R 5th toe.  No erythema, no edema, no drainage, no flocculence.  Musculoskeletal: Normal muscle strength 5/5 to all lower extremity muscle groups bilaterally. No pain crepitus or joint limitation noted with ROM b/l. Hallux valgus with bunion deformity noted b/l lower extremities. Hammertoes noted to the 2-5 bilaterally.  Neurological Examination: Protective sensation intact 5/5 intact bilaterally with 10g monofilament b/l. Vibratory sensation intact b/l.  Footwear Assessment: Does the patient wear appropriate shoes? Yes. Does the patient need inserts/orthotics? Yes.  Assessment: 1. Pain due to onychomycosis of toenails of both feet   2. Corns and callosities   3. Type II diabetes mellitus with peripheral circulatory disorder (HCC)   4. Hallux valgus, acquired, bilateral   5. Acquired hammertoes of both feet   6. Encounter for diabetic foot exam ([place])     ADA Risk Categorization: High Risk  Patient has one or more of the following: Loss of protective sensation Absent pedal pulses Severe Foot deformity History of foot ulcer  Plan: -Examined patient. -Diabetic foot examination performed on today's visit. -Continue diabetic foot care principles. -Patient to continue soft, supportive shoe gear daily. -Toenails 1-5 b/l were debrided in length and girth with sterile nail nippers and dremel without iatrogenic bleeding.  -Corn(s) L 5th toe and R 5th toe and callus(es) R hallux were pared utilizing sterile scalpel blade without incident. Total number debrided =3. -Patient to report any pedal injuries to medical professional immediately. -Patient/POA to call should there be question/concern in the interim.  Return in about 3 months (around 01/08/2021) for diabetic toenails, corn(s)/callus(es).  Marzetta Board, DPM

## 2021-01-13 ENCOUNTER — Ambulatory Visit: Payer: Medicare Other | Admitting: Podiatry

## 2021-01-13 ENCOUNTER — Other Ambulatory Visit: Payer: Self-pay

## 2021-01-13 ENCOUNTER — Encounter: Payer: Self-pay | Admitting: Podiatry

## 2021-01-13 DIAGNOSIS — Z0000 Encounter for general adult medical examination without abnormal findings: Secondary | ICD-10-CM | POA: Insufficient documentation

## 2021-01-13 DIAGNOSIS — M2042 Other hammer toe(s) (acquired), left foot: Secondary | ICD-10-CM

## 2021-01-13 DIAGNOSIS — M2041 Other hammer toe(s) (acquired), right foot: Secondary | ICD-10-CM

## 2021-01-13 DIAGNOSIS — M2011 Hallux valgus (acquired), right foot: Secondary | ICD-10-CM

## 2021-01-13 DIAGNOSIS — M79674 Pain in right toe(s): Secondary | ICD-10-CM

## 2021-01-13 DIAGNOSIS — M2012 Hallux valgus (acquired), left foot: Secondary | ICD-10-CM

## 2021-01-13 DIAGNOSIS — M79675 Pain in left toe(s): Secondary | ICD-10-CM | POA: Diagnosis not present

## 2021-01-13 DIAGNOSIS — B351 Tinea unguium: Secondary | ICD-10-CM

## 2021-01-13 DIAGNOSIS — E1151 Type 2 diabetes mellitus with diabetic peripheral angiopathy without gangrene: Secondary | ICD-10-CM

## 2021-01-13 NOTE — Progress Notes (Signed)
Subjective: Jacob Avery presents today for at risk foot care. Pt has h/o NIDDM with PAD and painful thick toenails that are difficult to trim. Pain interferes with ambulation. Aggravating factors include wearing enclosed shoe gear. Pain is relieved with periodic professional debridement.   Patient's blood sugar was 135 mg/dl this morning. Last A1c was 7.6%.  He voices no new pedal problems on today's visit.  [name], Fransico Him, MD is patient's PCP. Last visit was 01/13/2021.   No Known Allergies   Review of Systems: Negative except as noted in the HPI.   Objective: There were no vitals filed for this visit.  Jacob Avery is a pleasant 75 y.o. male in NAD. AAO X 3.  Vascular Examination: Capillary refill time to digits immediate b/l. Nonpalpable DP pulse(s) b/l lower extremities. Nonpalpable PT pulse(s) b/l lower extremities. Pedal hair absent b/l Skin temperature gradient within normal limits b/l.  Dermatological Examination: Pedal skin with normal turgor, texture and tone bilaterally. No open wounds bilaterally. No interdigital macerations bilaterally. Toenails 1-5 b/l elongated, discolored, dystrophic, thickened, crumbly with subungual debris and tenderness to dorsal palpation.  Musculoskeletal: Normal muscle strength 5/5 to all lower extremity muscle groups bilaterally. No pain crepitus or joint limitation noted with ROM b/l. Hallux valgus with bunion deformity noted b/l lower extremities. Hammertoes noted to the 2-5 bilaterally.  Neurological Examination: Protective sensation intact 5/5 intact bilaterally with 10g monofilament b/l. Vibratory sensation intact b/l.  Assessment: 1. Pain due to onychomycosis of toenails of both feet   2. Hallux valgus, acquired, bilateral   3. Acquired hammertoes of both feet   4. Type II diabetes mellitus with peripheral circulatory disorder (HCC)     Plan: -Examined patient. -No new findings. No new orders. -Continue diabetic foot care  principles. -Patient to continue soft, supportive shoe gear daily. -Toenails 1-5 b/l were debrided in length and girth with sterile nail nippers and dremel without iatrogenic bleeding.  -Patient to report any pedal injuries to medical professional immediately. -Patient/POA to call should there be question/concern in the interim.  Return in about 3 months (around 04/15/2021).  Marzetta Board, DPM

## 2021-04-27 ENCOUNTER — Other Ambulatory Visit: Payer: Self-pay

## 2021-04-27 ENCOUNTER — Ambulatory Visit: Payer: Medicare Other | Admitting: Podiatry

## 2021-04-27 DIAGNOSIS — B351 Tinea unguium: Secondary | ICD-10-CM

## 2021-04-27 DIAGNOSIS — M79675 Pain in left toe(s): Secondary | ICD-10-CM

## 2021-04-27 DIAGNOSIS — M79674 Pain in right toe(s): Secondary | ICD-10-CM

## 2021-04-27 DIAGNOSIS — E1151 Type 2 diabetes mellitus with diabetic peripheral angiopathy without gangrene: Secondary | ICD-10-CM

## 2021-04-28 ENCOUNTER — Encounter: Payer: Self-pay | Admitting: Podiatry

## 2021-04-28 NOTE — Progress Notes (Signed)
Subjective: Jacob Avery is a pleasant 75 y.o. male patient seen today with h/o NIDDM with PAD and painful thick toenails that are difficult to trim. Pain interferes with ambulation. Aggravating factors include wearing enclosed shoe gear. Pain is relieved with periodic professional debridement.  He voices no new pedal problems on today's visit. His blood glucose was 128 mg/dl today.  PCP is [name], Fransico Him, MD. Last visit was: 01/13/2021.  No Known Allergies  Objective: Physical Exam  General: Jacob Avery is a pleasant 75 y.o. African American male, morbidly obese in NAD. AAO x 3.   Vascular:  Capillary fill time to digits <3 seconds b/l lower extremities. Nonpalpable DP pulse(s) b/l lower extremities. Nonpalpable PT pulse(s) b/l lower extremities. Pedal hair absent. Lower extremity skin temperature gradient within normal limits. No pain with calf compression b/l.  Dermatological:  Pedal skin with normal turgor, texture and tone b/l lower extremities No open wounds b/l lower extremities No interdigital macerations b/l lower extremities Toenails 1-5 b/l elongated, discolored, dystrophic, thickened, crumbly with subungual debris and tenderness to dorsal palpation. No hyperkeratotic nor porokeratotic lesions present on today's visit.  Musculoskeletal:  Normal muscle strength 5/5 to all lower extremity muscle groups bilaterally. No pain crepitus or joint limitation noted with ROM b/l. Hallux valgus with bunion deformity noted b/l lower extremities. Hammertoe(s) noted to the 2-5 bilaterally.  Neurological:  Protective sensation intact 5/5 intact bilaterally with 10g monofilament b/l. Vibratory sensation intact b/l.  Assessment and Plan:  1. Pain due to onychomycosis of toenails of both feet   2. Type II diabetes mellitus with peripheral circulatory disorder (HCC)     -Examined patient. -Continue diabetic foot care principles. -Patient to continue soft, supportive shoe gear  daily. -Toenails 1-5 b/l were debrided in length and girth with sterile nail nippers and dremel without iatrogenic bleeding.  -Patient to report any pedal injuries to medical professional immediately. -Patient/POA to call should there be question/concern in the interim.  Return in about 3 months (around 07/28/2021).  Marzetta Board, DPM

## 2021-05-26 NOTE — Progress Notes (Signed)
CARDIOLOGY CONSULT NOTE       Patient ID: Jacob Avery MRN: [id number] DOB/AGE: [birth year] 75 y.o.  Referring Physician: [name] Primary Physician: [name], MD Primary Cardiologist: Previously [name] Reason for Consultation: PVD/Bradycardia   Active Problems:   * No active hospital problems. *   HPI:  75 y.o. previously seen by Dr [name] and referred by DR [name] for ongoing evaluation of patients PVD, CAD, HTN, and HLD.and DM  He has not had a heart cath Presumed CAD based on abnormal myovue done June 2019 showing inferoseptal MI with mild peri infarct ischemia. CRF;s include obesity, HTN, HLD non smoker. He is followed by [facility] having had a left femoral endarterectomy and left axillobifemoral bypass by Dr [name] in July 2019 He has chronic exertional dyspnea and fatigue likely from his obesity Echo in September 2019 showed EF 60-65% with no significant valve disease His last duplex 05/28/19 showed possible 50-74% in inflow portion of the left limb of his graft   He is not on beta blockers and has chronic bradycardia. Monitor 01/31/20 showed no high grade AV block with average HR of 48 bpm In office today HR increased to mid 60's with a bit of ambulation He has no dizziness or pre syncope   No claudication Weight up and not very active Discussed diet at length. Has had vaccine HR in 50's today with no pre syncope   ROS All other systems reviewed and negative except as noted above  Past Medical History:  Diagnosis Date   Arthritis    Chronic kidney disease    Stage 2   CVA (cerebral vascular accident) ([place]) 10/2014    Late effect of cerebrovascular disease.  Mostly resolved, but can see difference in facial expressions.  tp-A, Plavix afterward   Diabetes mellitus without complication ([place])    type II, controlled, with renal comps   Diabetic foot ([place]) 04/05/2018   Normal sensation, skin intact   Diverticulosis    ED (erectile dysfunction)    GI bleed 04/2017    Gout    History of claustrophobia    with MRI   Hx of adenomatous polyp of rectum 06/10/2017   Hypercholesterolemia    Hypertension    Long term (current) use of insulin ([place])    Remains on basal/bolus therapy without hypoglycemia.   Microalbuminuria 2013   Morbid obesity ([place])    Obesity    OSA (obstructive sleep apnea)    Intolerant to CPAP   Peripheral vascular disease ([place])    Right calf pain 04/05/2018   Vertigo     Family History  Problem Relation Age of Onset   CVA Mother    Alzheimer's disease Mother    Heart attack Father    Heart disease Father    Diabetes Brother    Rectal cancer Brother    Stomach cancer Neg Hx    Colon cancer Neg Hx     Social History   Socioeconomic History   Marital status: Married    Spouse name: [name]   Number of children: 2   Years of education: hs gr   Highest education level: Not on file  Occupational History   Occupation: retired    Comment: IT consultant  Tobacco Use   Smoking status: Former    Packs/day: 1.00    Types: Cigarettes   Smokeless tobacco: Never   Tobacco comments:    Quit 20 years ago  Vaping Use   Vaping Use: Never used  Substance and Sexual Activity  Alcohol use: No    Alcohol/week: 0.0 standard drinks   Drug use: No   Sexual activity: Not Currently    Partners: Female    Birth control/protection: None  Other Topics Concern   Not on file  Social History Narrative   Patient is married with 2 children.   Patient is right handed.   Patient has hs education.   Patient drinks tea on sundays, sodas occ.   Social Determinants of Health   Financial Resource Strain: Not on file  Food Insecurity: Not on file  Transportation Needs: Not on file  Physical Activity: Not on file  Stress: Not on file  Social Connections: Not on file  Intimate Partner Violence: Not on file    Past Surgical History:  Procedure Laterality Date   ABDOMINAL AORTOGRAM N/A 04/13/2018   Procedure: ABDOMINAL AORTOGRAM;  Surgeon: Conrad Bethlehem, MD;  Location: [place] [facility];  Service: Cardiovascular;  Laterality: N/A;   APPLICATION OF WOUND VAC Bilateral 05/30/2018   Procedure: APPLICATION OF WOUND VAC;  Surgeon: Conrad Cl[place]on [place], MD;  Location: [place];  Service: Vascular;  Laterality: Bilateral;   AXILLARY-FEMORAL BYPASS GRAFT Left 05/30/2018   Procedure: BYPASS GRAFT LEFT AXILLA-BIFEMORAL WITH HEMO SHIELD GOLD VASCULAR GRAFTS;  Surgeon: Conrad Chadwick, MD;  Location: [place];  Service: Vascular;  Laterality: Left;   COLONOSCOPY WITH PROPOFOL N/A 06/08/2017   Procedure: COLONOSCOPY WITH PROPOFOL ( Ultra-Slim Scope);  Surgeon: Gatha Mayer, MD;  Location: [facility];  Service: Endoscopy;  Laterality: N/A;   ENDARTERECTOMY FEMORAL Bilateral 05/30/2018   Procedure: ENDARTERECTOMY FEMORAL LEFT;  Surgeon: Conrad Bradley, MD;  Location: [place];  Service: Vascular;  Laterality: Bilateral;   INTRAOPERATIVE ARTERIOGRAM Bilateral 05/30/2018   Procedure: INTRA OPERATIVE ARTERIOGRAM BILATERAL LOWER EXTREMITIES;  Surgeon: Conrad Wildwood, MD;  Location: [place];  Service: Vascular;  Laterality: Bilateral;   LOWER EXTREMITY ANGIOGRAPHY N/A 04/13/2018   Procedure: LOWER EXTREMITY ANGIOGRAPHY;  Surgeon: Conrad Saddle [place], MD;  Location: [place] [facility];  Service: Cardiovascular;  Laterality: N/A;  bilateral    UMBILICAL HERNIA REPAIR        Current Outpatient Medications:    Accu-Chek FastClix Lancets MISC, , Disp: , Rfl:    ACCU-CHEK GUIDE test strip, USE STRIP TO CHECK GLUCOSE THREE TIMES DAILY, Disp: , Rfl:    acetaminophen (TYLENOL) 650 MG CR tablet, Take 1,300 mg by mouth 2 (two) times daily as needed for pain. , Disp: , Rfl:    amLODipine (NORVASC) 10 MG tablet, Take 1 tablet by mouth daily., Disp: , Rfl:    aspirin EC 81 MG tablet, Take 81 mg by mouth daily., Disp: , Rfl:    atorvastatin (LIPITOR) 80 MG tablet, Take 80 mg by mouth daily., Disp: , Rfl:    clopidogrel (PLAVIX) 75 MG tablet, Take 1 tablet by mouth daily., Disp: , Rfl:     colchicine 0.6 MG tablet, Take 1 tablet by mouth daily., Disp: , Rfl:    ezetimibe (ZETIA) 10 MG tablet, Take 1 tablet by mouth daily., Disp: , Rfl:    ferrous sulfate 325 (65 FE) MG tablet, Take 325 mg by mouth daily with breakfast., Disp: , Rfl:    HUMALOG KWIKPEN 100 UNIT/ML KwikPen, Inject into the skin., Disp: , Rfl:    Insulin Pen Needle (B-D ULTRAFINE III SHORT PEN) 31G X 8 MM MISC, use pen needs 4 times a day with insulin  (DX: ICD10: E11.51), Disp: , Rfl:    Insulin  Syringe-Needle U-100 31G X 5/16" 1 ML MISC, use one needle once daily, Disp: , Rfl:    LANTUS SOLOSTAR 100 UNIT/ML Solostar Pen, Inject 30-40 Units into the skin See admin instructions. Inject 40 units subcutaneously in the morning, and 30 units subcutaneously in the evening, Disp: , Rfl:    losartan-hydrochlorothiazide (HYZAAR) 100-25 MG per tablet, Take 1 tablet by mouth daily., Disp: , Rfl:    metoprolol tartrate (LOPRESSOR) 25 MG tablet, Take 1 tablet by mouth 2 (two) times daily., Disp: , Rfl:    nitroGLYCERIN (NITROSTAT) 0.4 MG SL tablet, Place 1 tablet (0.4 mg total) under the tongue every 5 (five) minutes as needed for chest pain., Disp: 25 tablet, Rfl: 3   omeprazole (PRILOSEC) 20 MG capsule, Take 1 capsule by mouth daily., Disp: , Rfl:    OVER THE COUNTER MEDICATION, Apply 1 application topically daily as needed (pain). Two [place] otc muscle rub, Disp: , Rfl:    oxyCODONE-acetaminophen (PERCOCET/ROXICET) 5-325 MG tablet, Take 1 tablet by mouth every 6 (six) hours as needed for moderate pain., Disp: 20 tablet, Rfl: 0   Oxymetazoline HCl (VICKS SINEX 12 HOUR NA), Place 1 spray into both nostrils daily as needed (congestion)., Disp: , Rfl:    tamsulosin (FLOMAX) 0.4 MG CAPS capsule, Take 1 capsule by mouth at bedtime., Disp: , Rfl:     Physical Exam: Blood pressure (!) 148/80, pulse (!) 41, height '5\' 7"'$  (1.702 m), weight 131.5 kg, SpO2 98 %.   Affect appropriate Obese black male  HEENT: normal Neck supple with  no adenopathy JVP normal no bruits no thyromegaly Lungs clear with no wheezing and good diaphragmatic motion Heart:  S1/S2 no murmur, no rub, gallop or click PMI normal Abdomen: benighn, BS positve, no tenderness, no AAA no bruit.  No HSM or HJR Left axillobifemoral bypass graft  Neuro non-focal Skin warm and dry Post left axillary grafting with fem fem and decreased peripheral pulses    Labs:   Lab Results  Component Value Date   WBC 8.8 05/31/2018   HGB 10.3 (L) 05/31/2018   HCT 33.7 (L) 05/31/2018   MCV 79.5 05/31/2018   PLT 242 05/31/2018   No results for input(s): NA, K, CL, CO2, BUN, CREATININE, CALCIUM, PROT, BILITOT, ALKPHOS, ALT, AST, GLUCOSE in the last 168 hours.  Invalid input(s): LABALBU No results found for: CKTOTAL, CKMB, CKMBINDEX, TROPONINI  Lab Results  Component Value Date   CHOL 138 10/26/2014   Lab Results  Component Value Date   HDL 29 (L) 10/26/2014   Lab Results  Component Value Date   LDLCALC 91 10/26/2014   Lab Results  Component Value Date   TRIG 91 10/26/2014   Lab Results  Component Value Date   CHOLHDL 4.8 10/26/2014   No results found for: LDLDIRECT    Radiology: No results found.  EKG: SB rate 44 no AV block 01/10/20 06/02/2021 SB rate 40 otherwise normal    ASSESSMENT AND PLAN:   1. CAD:  Presumed based on abnormal myovue in 2019 no chest pain observe   2. PVD: post Ax-Fem with abnormal surveillance f/u US done 05/28/19 Needs f/u with [facility] will arrange ? With Dr Scot Dock who saw him last   3. HTN:  Well controlled.  Continue current medications and low sodium Dash type diet.    4. HLD:  Continue statin labs with primary   5. DM:  Discussed low carb diet.  Target hemoglobin A1c is 6.5 or less.  Continue current  medications.  6. Bradycardia:  Stable rates in 50's no symptoms avoid AV nodal drugs   F/U in a year   Signed: Jenkins Rouge 06/02/2021, 10:59 AM

## 2021-06-02 ENCOUNTER — Ambulatory Visit (INDEPENDENT_AMBULATORY_CARE_PROVIDER_SITE_OTHER): Payer: Medicare Other | Admitting: Cardiovascular Disease

## 2021-06-02 ENCOUNTER — Other Ambulatory Visit: Payer: Self-pay

## 2021-06-02 ENCOUNTER — Encounter: Payer: Self-pay | Admitting: Cardiovascular Disease

## 2021-06-02 VITALS — BP 148/80 | HR 41 | Ht 67.0 in | Wt 289.9 lb

## 2021-06-02 DIAGNOSIS — I10 Essential (primary) hypertension: Secondary | ICD-10-CM

## 2021-06-02 DIAGNOSIS — I2510 Atherosclerotic heart disease of native coronary artery without angina pectoris: Secondary | ICD-10-CM | POA: Diagnosis not present

## 2021-06-02 DIAGNOSIS — I739 Peripheral vascular disease, unspecified: Secondary | ICD-10-CM

## 2021-06-02 DIAGNOSIS — R001 Bradycardia, unspecified: Secondary | ICD-10-CM | POA: Diagnosis not present

## 2021-06-02 DIAGNOSIS — E782 Mixed hyperlipidemia: Secondary | ICD-10-CM

## 2021-06-02 NOTE — Patient Instructions (Signed)
Medication Instructions:  Your physician recommends that you continue on your current medications as directed. Please refer to the Current Medication list given to you today.  *If you need a refill on your cardiac medications before your next appointment, please call your pharmacy*   Lab Work: NONE   If you have labs (blood work) drawn today and your tests are completely normal, you will receive your results only by: . MyChart Message (if you have MyChart) OR . A paper copy in the mail If you have any lab test that is abnormal or we need to change your treatment, we will call you to review the results.   Testing/Procedures: NONE    Follow-Up: At [facility], you and your health needs are our priority.  As part of our continuing mission to provide you with exceptional heart care, we have created designated Provider Care Teams.  These Care Teams include your primary Cardiologist (physician) and Advanced Practice Providers (APPs -  Physician Assistants and Nurse Practitioners) who all work together to provide you with the care you need, when you need it.  We recommend signing up for the patient portal called "MyChart".  Sign up information is provided on this After Visit Summary.  MyChart is used to connect with patients for Virtual Visits (Telemedicine).  Patients are able to view lab/test results, encounter notes, upcoming appointments, etc.  Non-urgent messages can be sent to your provider as well.   To learn more about what you can do with MyChart, go to [email or web address].    Your next appointment:   1 year(s)  The format for your next appointment:   In Person  Provider:   Peter Nishan, MD   Other Instructions Thank you for choosing [facility]!    

## 2021-06-22 NOTE — H&P (Signed)
Surgical History & Physical  Patient Name: Jacob Avery DOB: [birth year]  Surgery: Cataract extraction with intraocular lens implant phacoemulsification; Right Eye  Surgeon: Baruch Goldmann MD Surgery Date:  07/03/2021 Pre-Op Date:  06/19/2021  HPI: A 41 Yr. old male patient 1. 1. The patient complains of difficulty when driving, which began many years ago. Both eyes are affected. The episode is gradual. The condition's severity decreased since last visit. Symptoms occur when the patient is driving. The condition is worse night. The complaint is associated with blurry vision and glare. The patient also has trouble with blurry near vision, and states it is difficult to read even with glasses. This is negatively affecting the patient's quality of life. The patient denies any pain or discomfort, and denies any floaters or flashes of light. The patient is diabetic, Type 2. HPI Completed by Dr. Baruch Goldmann  Medical History: Dry Eyes Cataracts Diabetes High Blood Pressure LDL Stroke  Review of Systems Negative Allergic/Immunologic Negative Cardiovascular Negative Constitutional Negative Ear, Nose, Mouth & Throat Negative Endocrine Negative Eyes Negative Gastrointestinal Negative Genitourinary Negative Hemotologic/Lymphatic Negative Integumentary Negative Musculoskeletal Negative Neurological Negative Psychiatry Negative Respiratory  Social   Former smoker   Medication AMLODIPINE BESYLATE, EZETIMIBE, OMEPRAZOLE, HUMALOG KWIKPEN, LOSARTAN POTASSIUM/HYDROCHLOROTHIAZ IDE, TAMSULOSIN HYDROCHLORIDE,   Sx/Procedures Hernia Sx, Drafts inserted,   Drug Allergies   NKDA  History & Physical: Heent:  Cataract, Right eye NECK: supple without bruits LUNGS: lungs clear to auscultation CV: regular rate and rhythm Abdomen: soft and non-tender  Impression & Plan: Assessment: 1.  COMBINED FORMS AGE RELATED CATARACT; Both Eyes (H25.813) 2.  Pinguecula; Both Eyes (H11.153) 3.   DERMATOCHALASIS (H02.831, H02.834) 4.  ASTIGMATISM, REGULAR; Both Eyes (H52.223)  Plan: 1.  Cataract accounts for the patient's decreased vision. This visual impairment is not correctable with a tolerable change in glasses or contact lenses. Cataract surgery with an implantation of a new lens should significantly improve the visual and functional status of the patient. Discussed all risks, benefits, alternatives, and potential complications. Discussed the procedures and recovery. Patient desires to have surgery. A-scan ordered and performed today for intra-ocular lens calculations. The surgery will be performed in order to improve vision for driving, reading, and for eye examinations. Recommend phacoemulsification with intra-ocular lens. Recommend Dextenza for post-operative pain and inflammation. Right Eye worse - first. Dilates well - shugarcaine by protocol. Declines toric.  2.  Observe; Artificial tears as needed for irritation.  3.  Asymptomatic, recommend observation for now. Findings, prognosis and treatment options reviewed.  4.  Discussed/Declined Toric IOL.

## 2021-06-29 ENCOUNTER — Encounter (HOSPITAL_COMMUNITY): Payer: Self-pay

## 2021-06-29 ENCOUNTER — Encounter (HOSPITAL_COMMUNITY)
Admission: RE | Admit: 2021-06-29 | Discharge: 2021-06-29 | Disposition: A | Discharge status: Home / Self Care | Payer: Medicare Other | Source: Ambulatory Visit | Attending: Ophthalmology | Admitting: Ophthalmology

## 2021-06-29 ENCOUNTER — Other Ambulatory Visit: Payer: Self-pay

## 2021-07-03 ENCOUNTER — Encounter (HOSPITAL_COMMUNITY)
Admission: RE | Disposition: A | Discharge status: Home / Self Care | Payer: Self-pay | Source: Home / Self Care | Attending: Ophthalmology

## 2021-07-03 ENCOUNTER — Ambulatory Visit (HOSPITAL_COMMUNITY): Payer: Medicare Other | Admitting: Anesthesiology

## 2021-07-03 ENCOUNTER — Ambulatory Visit (HOSPITAL_COMMUNITY)
Admission: RE | Admit: 2021-07-03 | Discharge: 2021-07-03 | Disposition: A | Discharge status: Home / Self Care | Payer: Medicare Other | Attending: Ophthalmology | Admitting: Ophthalmology

## 2021-07-03 DIAGNOSIS — Z87891 Personal history of nicotine dependence: Secondary | ICD-10-CM | POA: Diagnosis not present

## 2021-07-03 DIAGNOSIS — H25813 Combined forms of age-related cataract, bilateral: Secondary | ICD-10-CM | POA: Diagnosis not present

## 2021-07-03 DIAGNOSIS — H02839 Dermatochalasis of unspecified eye, unspecified eyelid: Secondary | ICD-10-CM | POA: Diagnosis not present

## 2021-07-03 DIAGNOSIS — H52223 Regular astigmatism, bilateral: Secondary | ICD-10-CM | POA: Diagnosis not present

## 2021-07-03 DIAGNOSIS — Z79899 Other long term (current) drug therapy: Secondary | ICD-10-CM | POA: Insufficient documentation

## 2021-07-03 DIAGNOSIS — E1136 Type 2 diabetes mellitus with diabetic cataract: Secondary | ICD-10-CM | POA: Insufficient documentation

## 2021-07-03 DIAGNOSIS — H11153 Pinguecula, bilateral: Secondary | ICD-10-CM | POA: Insufficient documentation

## 2021-07-03 HISTORY — PX: CATARACT EXTRACTION W/PHACO: SHX586

## 2021-07-03 LAB — GLUCOSE, CAPILLARY: Glucose-Capillary: 112 mg/dL — ABNORMAL HIGH (ref 70–99)

## 2021-07-03 SURGERY — PHACOEMULSIFICATION, CATARACT, WITH IOL INSERTION
Anesthesia: Monitor Anesthesia Care | Site: Eye | Laterality: Right

## 2021-07-03 MED ORDER — TETRACAINE HCL 0.5 % OP SOLN
1.0000 [drp] | OPHTHALMIC | Status: AC | PRN
  Administered 2021-07-03 (×3): 1 [drp] via OPHTHALMIC

## 2021-07-03 MED ORDER — EPINEPHRINE PF 1 MG/ML IJ SOLN
INTRAMUSCULAR | Status: AC
  Filled 2021-07-03: qty 2

## 2021-07-03 MED ORDER — BSS IO SOLN
INTRAOCULAR | Status: DC | PRN
  Administered 2021-07-03: 15 mL via INTRAOCULAR

## 2021-07-03 MED ORDER — SODIUM CHLORIDE 0.9% FLUSH
INTRAVENOUS | Status: DC | PRN
  Administered 2021-07-03: 3 mL via INTRAVENOUS

## 2021-07-03 MED ORDER — LIDOCAINE HCL 3.5 % OP GEL
1.0000 "application " | Freq: Once | OPHTHALMIC | Status: AC
  Administered 2021-07-03: 1 via OPHTHALMIC

## 2021-07-03 MED ORDER — SODIUM HYALURONATE 10 MG/ML IO SOLUTION
PREFILLED_SYRINGE | INTRAOCULAR | Status: DC | PRN
  Administered 2021-07-03: 0.85 mL via INTRAOCULAR

## 2021-07-03 MED ORDER — NEOMYCIN-POLYMYXIN-DEXAMETH 3.5-10000-0.1 OP SUSP
OPHTHALMIC | Status: DC | PRN
  Administered 2021-07-03: 1 [drp] via OPHTHALMIC

## 2021-07-03 MED ORDER — PHENYLEPHRINE HCL 2.5 % OP SOLN
1.0000 [drp] | OPHTHALMIC | Status: AC | PRN
  Administered 2021-07-03 (×3): 1 [drp] via OPHTHALMIC

## 2021-07-03 MED ORDER — POVIDONE-IODINE 5 % OP SOLN
OPHTHALMIC | Status: DC | PRN
  Administered 2021-07-03: 1 via OPHTHALMIC

## 2021-07-03 MED ORDER — MIDAZOLAM HCL 2 MG/2ML IJ SOLN
INTRAMUSCULAR | Status: AC
  Filled 2021-07-03: qty 2

## 2021-07-03 MED ORDER — EPINEPHRINE PF 1 MG/ML IJ SOLN
INTRAOCULAR | Status: DC | PRN
  Administered 2021-07-03: 500 mL

## 2021-07-03 MED ORDER — SODIUM HYALURONATE 23MG/ML IO SOSY
PREFILLED_SYRINGE | INTRAOCULAR | Status: DC | PRN
  Administered 2021-07-03: 0.6 mL via INTRAOCULAR

## 2021-07-03 MED ORDER — TROPICAMIDE 1 % OP SOLN
1.0000 [drp] | OPHTHALMIC | Status: AC
  Administered 2021-07-03 (×3): 1 [drp] via OPHTHALMIC

## 2021-07-03 MED ORDER — STERILE WATER FOR IRRIGATION IR SOLN
Status: DC | PRN
  Administered 2021-07-03: 250 mL

## 2021-07-03 MED ORDER — MIDAZOLAM HCL 2 MG/2ML IJ SOLN
INTRAMUSCULAR | Status: DC | PRN
  Administered 2021-07-03: 1 mg via INTRAVENOUS

## 2021-07-03 MED ORDER — LIDOCAINE HCL (PF) 1 % IJ SOLN
INTRAOCULAR | Status: DC | PRN
  Administered 2021-07-03: 1 mL via OPHTHALMIC

## 2021-07-03 SURGICAL SUPPLY — 11 items
CLOTH BEACON ORANGE TIMEOUT ST (SAFETY) ×2 IMPLANT
EYE SHIELD UNIVERSAL CLEAR (GAUZE/BANDAGES/DRESSINGS) ×2 IMPLANT
GLOVE SURG UNDER POLY LF SZ6.5 (GLOVE) ×2 IMPLANT
GLOVE SURG UNDER POLY LF SZ7 (GLOVE) ×2 IMPLANT
NEEDLE HYPO 18GX1.5 BLUNT FILL (NEEDLE) ×2 IMPLANT
PAD ARMBOARD 7.5X6 YLW CONV (MISCELLANEOUS) ×2 IMPLANT
RayOne EMV US (Intraocular Lens) ×2 IMPLANT
SYR TB 1ML LL NO SAFETY (SYRINGE) ×2 IMPLANT
TAPE SURG TRANSPORE 1 IN (GAUZE/BANDAGES/DRESSINGS) ×1 IMPLANT
TAPE SURGICAL TRANSPORE 1 IN (GAUZE/BANDAGES/DRESSINGS) ×2
WATER STERILE IRR 250ML POUR (IV SOLUTION) ×2 IMPLANT

## 2021-07-03 NOTE — Interval H&P Note (Signed)
History and Physical Interval Note:  07/03/2021 7:51 AM  Jacob Avery  has presented today for surgery, with the diagnosis of Nuclear sclerotic cataract - Right eye.  The various methods of treatment have been discussed with the patient and family. After consideration of risks, benefits and other options for treatment, the patient has consented to  Procedure(s) with comments: CATARACT EXTRACTION PHACO AND INTRAOCULAR LENS PLACEMENT (IOC) (Right) - right as a surgical intervention.  The patient's history has been reviewed, patient examined, no change in status, stable for surgery.  I have reviewed the patient's chart and labs.  Questions were answered to the patient's satisfaction.     Baruch Goldmann

## 2021-07-03 NOTE — Op Note (Signed)
Date of procedure: 07/03/21  Pre-operative diagnosis:  Visually significant combined form age-related cataract, Right Eye (H25.811)  Post-operative diagnosis:  Visually significant combined form age-related cataract, Right Eye (H25.811)  Procedure: Removal of cataract via phacoemulsification and insertion of intra-ocular lens Rayner [id number] +20.0D into the capsular bag of the Right Eye  Attending surgeon: Gerda Diss. Berdie Malter, MD, MA  Anesthesia: MAC, Topical Ak[place]  Complications: None  Estimated Blood Loss: <60m (minimal)  Specimens: None  Implants: As above  Indications:  Visually significant age-related cataract, Right Eye  Procedure:  The patient was seen and identified in the pre-operative area. The operative eye was identified and dilated.  The operative eye was marked.  Topical anesthesia was administered to the operative eye.     The patient was then to the operative suite and placed in the supine position.  A timeout was performed confirming the patient, procedure to be performed, and all other relevant information.   The patient's face was prepped and draped in the usual fashion for intra-ocular surgery.  A lid speculum was placed into the operative eye and the surgical microscope moved into place and focused.  A superotemporal paracentesis was created using a 20 gauge paracentesis blade.  Shugarcaine was injected into the anterior chamber.  Viscoelastic was injected into the anterior chamber.  A temporal clear-corneal main wound incision was created using a 2.4106mmicrokeratome.  A continuous curvilinear capsulorrhexis was initiated using an irrigating cystitome and completed using capsulorrhexis forceps.  Hydrodissection and hydrodeliniation were performed.  Viscoelastic was injected into the anterior chamber.  A phacoemulsification handpiece and a chopper as a second instrument were used to remove the nucleus and epinucleus. The irrigation/aspiration handpiece was used to remove any  remaining cortical material.   The capsular bag was reinflated with viscoelastic, checked, and found to be intact.  The intraocular lens was inserted into the capsular bag.  The irrigation/aspiration handpiece was used to remove any remaining viscoelastic.  The clear corneal wound and paracentesis wounds were then hydrated and checked with Weck-Cels to be watertight.  The lid-speculum was removed.  The drape was removed.  The patient's face was cleaned with a wet and dry 4x4.   Maxitrol was instilled in the eye. A clear shield was taped over the eye. The patient was taken to the post-operative care unit in good condition, having tolerated the procedure well.  Post-Op Instructions: [facility] and will receive all other orders and instructions.

## 2021-07-03 NOTE — Anesthesia Preprocedure Evaluation (Addendum)
Anesthesia Evaluation  Patient identified by MRN, date of birth, ID band Patient awake    Reviewed: Allergy & Precautions, NPO status , Patient's Chart, lab work & pertinent test results, reviewed documented beta blocker date and time   History of Anesthesia Complications Negative for: history of anesthetic complications  Airway Mallampati: II  TM Distance: >3 FB Neck ROM: Full    Dental  (+) Edentulous Upper, Edentulous Lower   Pulmonary sleep apnea (noncomplaint with CPAP) , former smoker,    Pulmonary exam normal breath sounds clear to auscultation       Cardiovascular Exercise Tolerance: Good hypertension, Pt. on home beta blockers and Pt. on medications + Peripheral Vascular Disease   Rhythm:Regular Rate:Bradycardia - Systolic murmurs, - Diastolic murmurs, - Friction Rub, - Carotid Bruit, - Peripheral Edema and - Systolic Click    Neuro/Psych CVA    GI/Hepatic negative GI ROS,   Endo/Other  diabetes, Well Controlled, Type 2, Oral Hypoglycemic Agents, Insulin Dependent  Renal/GU Renal InsufficiencyRenal disease     Musculoskeletal  (+) Arthritis ,   Abdominal   Peds  Hematology   Anesthesia Other Findings Claustrophobia   Reproductive/Obstetrics                           Anesthesia Physical Anesthesia Plan  ASA: 3  Anesthesia Plan: MAC   Post-op Pain Management:    Induction:   PONV Risk Score and Plan:   Airway Management Planned: Nasal Cannula and Natural Airway  Additional Equipment:   Intra-op Plan:   Post-operative Plan:   Informed Consent: I have reviewed the patients History and Physical, chart, labs and discussed the procedure including the risks, benefits and alternatives for the proposed anesthesia with the patient or authorized representative who has indicated his/her understanding and acceptance.     Dental advisory given  Plan Discussed with: CRNA and  Surgeon  Anesthesia Plan Comments:         Anesthesia Quick Evaluation

## 2021-07-03 NOTE — Discharge Instructions (Signed)
Please discharge patient when stable, will follow up today with Dr. Marcianne Ozbun at the [facility] office immediately following discharge.  Leave shield in place until visit.  All paperwork with discharge instructions will be given at the office.  [facility] Address:  [address]  [place], [place] [address]  

## 2021-07-03 NOTE — Progress Notes (Signed)
Dr Charna Elizabeth and I instructed patient to see his primary doctor about his low heart rate. He is taking his metoprolol 1/2 dose twice a day

## 2021-07-03 NOTE — Anesthesia Postprocedure Evaluation (Signed)
Anesthesia Post Note  Patient: Jacob Avery  Procedure(s) Performed: CATARACT EXTRACTION PHACO AND INTRAOCULAR LENS PLACEMENT (IOC) (Right: Eye)  Patient location during evaluation: Phase II Anesthesia Type: MAC Level of consciousness: awake and alert and oriented Pain management: pain level controlled Vital Signs Assessment: post-procedure vital signs reviewed and stable Respiratory status: spontaneous breathing and respiratory function stable Cardiovascular status: blood pressure returned to baseline and stable Postop Assessment: no apparent nausea or vomiting Anesthetic complications: no   No notable events documented.   Last Vitals:  Vitals:   07/03/21 0718 07/03/21 0828  BP: (!) 153/64 (!) 140/57  Pulse: (!) 34 (!) 43  Resp: 16 18  Temp: 36.8 C 36.5 C  SpO2: 97% 96%    Last Pain:  Vitals:   07/03/21 0828  TempSrc: Axillary  PainSc: 0-No pain                 Annaleah Arata C Medea Deines

## 2021-07-03 NOTE — Anesthesia Procedure Notes (Signed)
Procedure Name: MAC Date/Time: 07/03/2021 8:09 AM Performed by: Vista Deck, CRNA Pre-anesthesia Checklist: Patient identified, Emergency Drugs available, Suction available, Timeout performed and Patient being monitored Patient Re-evaluated:Patient Re-evaluated prior to induction Oxygen Delivery Method: Nasal Cannula

## 2021-07-03 NOTE — Transfer of Care (Signed)
Immediate Anesthesia Transfer of Care Note  Patient: Jacob Avery  Procedure(s) Performed: CATARACT EXTRACTION PHACO AND INTRAOCULAR LENS PLACEMENT (IOC) (Right: Eye)  Patient Location: Short Stay  Anesthesia Type:MAC  Level of Consciousness: awake and alert   Airway & Oxygen Therapy: Patient Spontanous Breathing  Post-op Assessment: Report given to RN and Post -op Vital signs reviewed and stable  Post vital signs: Reviewed and stable  Last Vitals:  Vitals Value Taken Time  BP    Temp    Pulse    Resp    SpO2     SEE VITAL SIGN FLOW SHEET Last Pain:  Vitals:   07/03/21 0718  TempSrc: Oral  PainSc: 0-No pain      Patients Stated Pain Goal: 5 (25/85/27 7824)  Complications: No notable events documented.

## 2021-07-07 ENCOUNTER — Encounter (HOSPITAL_COMMUNITY): Payer: Self-pay | Admitting: Ophthalmology

## 2021-07-10 ENCOUNTER — Other Ambulatory Visit: Payer: Self-pay

## 2021-07-10 ENCOUNTER — Encounter (HOSPITAL_COMMUNITY)
Admission: RE | Admit: 2021-07-10 | Discharge: 2021-07-10 | Disposition: A | Discharge status: Home / Self Care | Payer: Medicare Other | Source: Ambulatory Visit | Attending: Ophthalmology | Admitting: Ophthalmology

## 2021-07-10 ENCOUNTER — Encounter (HOSPITAL_COMMUNITY): Payer: Self-pay

## 2021-07-13 ENCOUNTER — Other Ambulatory Visit: Payer: Self-pay

## 2021-07-13 DIAGNOSIS — I739 Peripheral vascular disease, unspecified: Secondary | ICD-10-CM

## 2021-07-14 NOTE — H&P (Signed)
Surgical History & Physical  Patient Name: Jacob Avery DOB: May 03, 1946  Surgery: Cataract extraction with intraocular lens implant phacoemulsification; Left Eye  Surgeon: Baruch Goldmann MD Surgery Date:  07-17-2021 Pre-Op Date: 07-09-2021  HPI: A 68 Yr. old male patient PO-OD/Pre-Op OS The patient is returning after cataract surgery. The right eye is affected. Status post cataract surgery, which began 1 week ago: Since the last visit, the affected area is doing well. The patient's vision is improved and stable. Patient is following medication instructions. Pt taking purple top TID OD. Pt is compliant. Pt denies any increase in floaters/flashes of light. The patient complains of difficulty when driving, which began many years ago. The left eye is affected. The episode is gradual. The condition's severity decreased since last visit. Symptoms occur when the patient is driving. The condition is worse night. The complaint is associated with blurry vision and glare. The patient also has trouble with blurry near vision, and states it is difficult to read even with glasses. This is negatively affecting the patient's quality of life. HPI Completed by Dr. Baruch Goldmann  Medical History: Dry Eyes Cataracts Diabetes High Blood Pressure LDL Stroke  Review of Systems Negative Allergic/Immunologic Negative Cardiovascular Negative Constitutional Negative Ear, Nose, Mouth & Throat Negative Endocrine Negative Eyes Negative Gastrointestinal Negative Genitourinary Negative Hemotologic/Lymphatic Negative Integumentary Negative Musculoskeletal Negative Neurological Negative Psychiatry Negative Respiratory  Social   Former smoker   Medication Prednisolone-Moxifloxacin-Bromfenac, AMLODIPINE BESYLATE, EZETIMIBE, OMEPRAZOLE, HUMALOG KWIKPEN, LOSARTAN POTASSIUM/HYDROCHLOROTHIAZ IDE, TAMSULOSIN HYDROCHLORIDE,   Sx/Procedures Phaco c IOL OD, Hernia Sx, Drafts inserted,   Drug Allergies    NKDA  History & Physical: Heent: Cataract, Left Eye NECK: supple without bruits LUNGS: lungs clear to auscultation CV: regular rate and rhythm Abdomen: soft and non-tender  Impression & Plan: Assessment: 1.  CATARACT EXTRACTION STATUS; Right Eye (Z98.41) 2.  INTRAOCULAR LENS IOL (Z96.1) 3.  COMBINED FORMS AGE RELATED CATARACT; Left Eye (H25.812)  Plan: 1.  1 week after cataract surgery. Doing well with improved vision and normal eye pressure. Call with any problems or concerns. Continue Pred-Moxi-Brom 2x/day for 3 more weeks.  2.  Doing well since surgery Continue Post-op medications  3.  Cataract accounts for the patient's decreased vision. This visual impairment is not correctable with a tolerable change in glasses or contact lenses. Cataract surgery with an implantation of a new lens should significantly improve the visual and functional status of the patient. Discussed all risks, benefits, alternatives, and potential complications. Discussed the procedures and recovery. Patient desires to have surgery. A-scan ordered and performed today for intra-ocular lens calculations. The surgery will be performed in order to improve vision for driving, reading, and for eye examinations. Recommend phacoemulsification with intra-ocular lens. Recommend Dextenza for post-operative pain and inflammation. Left Eye. Surgery required to correct imbalance of vision. Dilates well - shugarcaine by protocol.

## 2021-07-16 ENCOUNTER — Other Ambulatory Visit: Payer: Self-pay | Admitting: *Deleted

## 2021-07-16 DIAGNOSIS — I739 Peripheral vascular disease, unspecified: Secondary | ICD-10-CM

## 2021-07-17 ENCOUNTER — Ambulatory Visit (HOSPITAL_COMMUNITY): Payer: Medicare Other | Admitting: Anesthesiology

## 2021-07-17 ENCOUNTER — Encounter (HOSPITAL_COMMUNITY)
Admission: RE | Disposition: A | Discharge status: Home / Self Care | Payer: Self-pay | Source: Home / Self Care | Attending: Ophthalmology

## 2021-07-17 ENCOUNTER — Ambulatory Visit (HOSPITAL_COMMUNITY)
Admission: RE | Admit: 2021-07-17 | Discharge: 2021-07-17 | Disposition: A | Discharge status: Home / Self Care | Payer: Medicare Other | Attending: Ophthalmology | Admitting: Ophthalmology

## 2021-07-17 ENCOUNTER — Encounter (HOSPITAL_COMMUNITY): Payer: Self-pay | Admitting: Ophthalmology

## 2021-07-17 DIAGNOSIS — Z961 Presence of intraocular lens: Secondary | ICD-10-CM | POA: Diagnosis not present

## 2021-07-17 DIAGNOSIS — E1136 Type 2 diabetes mellitus with diabetic cataract: Secondary | ICD-10-CM | POA: Insufficient documentation

## 2021-07-17 DIAGNOSIS — H25812 Combined forms of age-related cataract, left eye: Secondary | ICD-10-CM | POA: Insufficient documentation

## 2021-07-17 DIAGNOSIS — Z79899 Other long term (current) drug therapy: Secondary | ICD-10-CM | POA: Diagnosis not present

## 2021-07-17 DIAGNOSIS — Z87891 Personal history of nicotine dependence: Secondary | ICD-10-CM | POA: Insufficient documentation

## 2021-07-17 DIAGNOSIS — Z794 Long term (current) use of insulin: Secondary | ICD-10-CM | POA: Insufficient documentation

## 2021-07-17 DIAGNOSIS — Z9841 Cataract extraction status, right eye: Secondary | ICD-10-CM | POA: Diagnosis not present

## 2021-07-17 HISTORY — PX: CATARACT EXTRACTION W/PHACO: SHX586

## 2021-07-17 LAB — GLUCOSE, CAPILLARY: Glucose-Capillary: 108 mg/dL — ABNORMAL HIGH (ref 70–99)

## 2021-07-17 SURGERY — PHACOEMULSIFICATION, CATARACT, WITH IOL INSERTION
Anesthesia: Monitor Anesthesia Care | Site: Eye | Laterality: Left

## 2021-07-17 MED ORDER — EPINEPHRINE PF 1 MG/ML IJ SOLN
INTRAOCULAR | Status: DC | PRN
  Administered 2021-07-17: 500 mL

## 2021-07-17 MED ORDER — LIDOCAINE HCL 3.5 % OP GEL
1.0000 "application " | Freq: Once | OPHTHALMIC | Status: AC
  Administered 2021-07-17: 1 via OPHTHALMIC

## 2021-07-17 MED ORDER — BSS IO SOLN
INTRAOCULAR | Status: DC | PRN
  Administered 2021-07-17: 15 mL via INTRAOCULAR

## 2021-07-17 MED ORDER — TROPICAMIDE 1 % OP SOLN
1.0000 [drp] | OPHTHALMIC | Status: AC
  Administered 2021-07-17 (×3): 1 [drp] via OPHTHALMIC

## 2021-07-17 MED ORDER — SODIUM HYALURONATE 10 MG/ML IO SOLUTION
PREFILLED_SYRINGE | INTRAOCULAR | Status: DC | PRN
  Administered 2021-07-17: 0.85 mL via INTRAOCULAR

## 2021-07-17 MED ORDER — GLYCOPYRROLATE 0.2 MG/ML IJ SOLN
INTRAMUSCULAR | Status: DC | PRN
  Administered 2021-07-17: .2 mg via INTRAVENOUS

## 2021-07-17 MED ORDER — STERILE WATER FOR IRRIGATION IR SOLN
Status: DC | PRN
  Administered 2021-07-17: 250 mL

## 2021-07-17 MED ORDER — SODIUM HYALURONATE 23MG/ML IO SOSY
PREFILLED_SYRINGE | INTRAOCULAR | Status: DC | PRN
  Administered 2021-07-17: 0.6 mL via INTRAOCULAR

## 2021-07-17 MED ORDER — LIDOCAINE HCL (PF) 1 % IJ SOLN
INTRAOCULAR | Status: DC | PRN
  Administered 2021-07-17: 1 mL via OPHTHALMIC

## 2021-07-17 MED ORDER — NEOMYCIN-POLYMYXIN-DEXAMETH 3.5-10000-0.1 OP SUSP
OPHTHALMIC | Status: DC | PRN
  Administered 2021-07-17: 1 [drp] via OPHTHALMIC

## 2021-07-17 MED ORDER — EPINEPHRINE PF 1 MG/ML IJ SOLN
INTRAMUSCULAR | Status: AC
  Filled 2021-07-17: qty 2

## 2021-07-17 MED ORDER — GLYCOPYRROLATE PF 0.2 MG/ML IJ SOSY
PREFILLED_SYRINGE | INTRAMUSCULAR | Status: AC
  Filled 2021-07-17: qty 1

## 2021-07-17 MED ORDER — PHENYLEPHRINE HCL 2.5 % OP SOLN
1.0000 [drp] | OPHTHALMIC | Status: AC | PRN
  Administered 2021-07-17 (×3): 1 [drp] via OPHTHALMIC

## 2021-07-17 MED ORDER — TETRACAINE HCL 0.5 % OP SOLN
1.0000 [drp] | OPHTHALMIC | Status: AC | PRN
  Administered 2021-07-17 (×3): 1 [drp] via OPHTHALMIC

## 2021-07-17 MED ORDER — POVIDONE-IODINE 5 % OP SOLN
OPHTHALMIC | Status: DC | PRN
  Administered 2021-07-17: 1 via OPHTHALMIC

## 2021-07-17 SURGICAL SUPPLY — 11 items
CLOTH BEACON ORANGE TIMEOUT ST (SAFETY) ×2 IMPLANT
EYE SHIELD UNIVERSAL CLEAR (GAUZE/BANDAGES/DRESSINGS) ×2 IMPLANT
GLOVE SURG UNDER POLY LF SZ6.5 (GLOVE) ×2 IMPLANT
GLOVE SURG UNDER POLY LF SZ7 (GLOVE) ×2 IMPLANT
NEEDLE HYPO 18GX1.5 BLUNT FILL (NEEDLE) ×2 IMPLANT
PAD ARMBOARD 7.5X6 YLW CONV (MISCELLANEOUS) ×2 IMPLANT
RayOne EMV US (Intraocular Lens) ×2 IMPLANT
SYR TB 1ML LL NO SAFETY (SYRINGE) ×2 IMPLANT
TAPE SURG TRANSPORE 1 IN (GAUZE/BANDAGES/DRESSINGS) ×1 IMPLANT
TAPE SURGICAL TRANSPORE 1 IN (GAUZE/BANDAGES/DRESSINGS) ×2
WATER STERILE IRR 250ML POUR (IV SOLUTION) ×2 IMPLANT

## 2021-07-17 NOTE — Discharge Instructions (Signed)
Please discharge patient when stable, will follow up today with Dr. Dalani Mette at the [facility] [place] office immediately following discharge.  Leave shield in place until visit.  All paperwork with discharge instructions will be given at the office.  [facility] Address:  [address]  [place], [place] [address]  

## 2021-07-17 NOTE — Interval H&P Note (Signed)
History and Physical Interval Note:  07/17/2021 10:59 AM  Jacob Avery  has presented today for surgery, with the diagnosis of Nuclear sclerotic cataract - Left eye.  The various methods of treatment have been discussed with the patient and family. After consideration of risks, benefits and other options for treatment, the patient has consented to  Procedure(s) with comments: CATARACT EXTRACTION PHACO AND INTRAOCULAR LENS PLACEMENT ([place]) (Left) - left as a surgical intervention.  The patient's history has been reviewed, patient examined, no change in status, stable for surgery.  I have reviewed the patient's chart and labs.  Questions were answered to the patient's satisfaction.     Baruch Goldmann

## 2021-07-17 NOTE — Anesthesia Postprocedure Evaluation (Signed)
Anesthesia Post Note  Patient: Gregg Winchell  Procedure(s) Performed: CATARACT EXTRACTION PHACO AND INTRAOCULAR LENS PLACEMENT ([facility]) (Left: Eye)  Patient location during evaluation: Phase II Anesthesia Type: MAC Level of consciousness: awake and alert and oriented Pain management: pain level controlled Vital Signs Assessment: post-procedure vital signs reviewed and stable Respiratory status: spontaneous breathing and respiratory function stable Cardiovascular status: blood pressure returned to baseline and stable Postop Assessment: no apparent nausea or vomiting Anesthetic complications: no   No notable events documented.   Last Vitals:  Vitals:   07/17/21 1025 07/17/21 1124  BP: (!) 151/72 134/61  Pulse: (!) 42 60  Resp: 16 18  Temp: 36.6 C (!) 36.1 C  SpO2: 97% 97%    Last Pain:  Vitals:   07/17/21 1124  TempSrc: Oral  PainSc: 0-No pain                 Dealva Lafoy C Priyana Mccarey

## 2021-07-17 NOTE — Transfer of Care (Signed)
Immediate Anesthesia Transfer of Care Note  Patient: Jacob Avery  Procedure(s) Performed: CATARACT EXTRACTION PHACO AND INTRAOCULAR LENS PLACEMENT ([facility]) (Left: Eye)  Patient Location: PACU  Anesthesia Type:MAC  Level of Consciousness: awake  Airway & Oxygen Therapy: Patient Spontanous Breathing  Post-op Assessment: Report given to RN and Post -op Vital signs reviewed and stable  Post vital signs: Reviewed and stable  Last Vitals:  Vitals Value Taken Time  BP    Temp    Pulse    Resp    SpO2      Last Pain:  Vitals:   07/17/21 1025  TempSrc: Oral  PainSc: 0-No pain      Patients Stated Pain Goal: 5 (73/22/56 [id number])  Complications: No notable events documented.

## 2021-07-17 NOTE — Op Note (Signed)
Date of procedure: 07/17/21  Pre-operative diagnosis: Visually significant age-related combined cataract, Left Eye (H25.812)  Post-operative diagnosis: Visually significant age-related combined cataract, Left Eye (H25.812)  Procedure: Removal of cataract via phacoemulsification and insertion of intra-ocular lens Rayner [id number] +19.0D into the capsular bag of the Left Eye  Attending surgeon: Gerda Diss. Kemani Demarais, MD, MA  Anesthesia: MAC, Topical Akten  Complications: None  Estimated Blood Loss: <93m (minimal)  Specimens: None  Implants: As above  Indications:  Visually significant age-related cataract, Left Eye  Procedure:  The patient was seen and identified in the pre-operative area. The operative eye was identified and dilated.  The operative eye was marked.  Topical anesthesia was administered to the operative eye.     The patient was then to the operative suite and placed in the supine position.  A timeout was performed confirming the patient, procedure to be performed, and all other relevant information.   The patient's face was prepped and draped in the usual fashion for intra-ocular surgery.  A lid speculum was placed into the operative eye and the surgical microscope moved into place and focused.  An inferotemporal paracentesis was created using a 20 gauge paracentesis blade.  Shugarcaine was injected into the anterior chamber.  Viscoelastic was injected into the anterior chamber.  A temporal clear-corneal main wound incision was created using a 2.466mmicrokeratome.  A continuous curvilinear capsulorrhexis was initiated using an irrigating cystitome and completed using capsulorrhexis forceps.  Hydrodissection and hydrodeliniation were performed.  Viscoelastic was injected into the anterior chamber.  A phacoemulsification handpiece and a chopper as a second instrument were used to remove the nucleus and epinucleus. The irrigation/aspiration handpiece was used to remove any remaining  cortical material.   The capsular bag was reinflated with viscoelastic, checked, and found to be intact.  The intraocular lens was inserted into the capsular bag.  The irrigation/aspiration handpiece was used to remove any remaining viscoelastic.  The clear corneal wound and paracentesis wounds were then hydrated and checked with Weck-Cels to be watertight.  The lid-speculum was removed.  The drape was removed.  The patient's face was cleaned with a wet and dry 4x4.   Maxitrol was instilled in the eye. A clear shield was taped over the eye. The patient was taken to the post-operative care unit in good condition, having tolerated the procedure well.  Post-Op Instructions: [facility] and will receive all other orders and instructions.

## 2021-07-17 NOTE — Anesthesia Preprocedure Evaluation (Signed)
Anesthesia Evaluation  Patient identified by MRN, date of birth, ID band Patient awake    Reviewed: Allergy & Precautions, NPO status , Patient's Chart, lab work & pertinent test results, reviewed documented beta blocker date and time   History of Anesthesia Complications Negative for: history of anesthetic complications  Airway Mallampati: II  TM Distance: >3 FB Neck ROM: Full    Dental  (+) Edentulous Upper, Edentulous Lower   Pulmonary sleep apnea (noncomplaint with CPAP) , former smoker,    Pulmonary exam normal breath sounds clear to auscultation       Cardiovascular Exercise Tolerance: Good hypertension, Pt. on home beta blockers and Pt. on medications + Peripheral Vascular Disease   Rhythm:Regular Rate:Bradycardia - Systolic murmurs, - Diastolic murmurs, - Friction Rub, - Carotid Bruit, - Peripheral Edema and - Systolic Click    Neuro/Psych CVA    GI/Hepatic negative GI ROS,   Endo/Other  diabetes, Well Controlled, Type 2, Oral Hypoglycemic Agents, Insulin DependentMorbid obesity  Renal/GU Renal InsufficiencyRenal disease     Musculoskeletal  (+) Arthritis ,   Abdominal   Peds  Hematology   Anesthesia Other Findings Claustrophobia   Reproductive/Obstetrics                            Anesthesia Physical  Anesthesia Plan  ASA: 3  Anesthesia Plan: MAC   Post-op Pain Management:    Induction:   PONV Risk Score and Plan:   Airway Management Planned: Nasal Cannula and Natural Airway  Additional Equipment:   Intra-op Plan:   Post-operative Plan:   Informed Consent: I have reviewed the patients History and Physical, chart, labs and discussed the procedure including the risks, benefits and alternatives for the proposed anesthesia with the patient or authorized representative who has indicated his/her understanding and acceptance.     Dental advisory given  Plan Discussed  with: CRNA and Surgeon  Anesthesia Plan Comments:         Anesthesia Quick Evaluation

## 2021-07-20 ENCOUNTER — Encounter (HOSPITAL_COMMUNITY): Payer: Self-pay | Admitting: Ophthalmology

## 2021-07-20 ENCOUNTER — Other Ambulatory Visit: Payer: Self-pay

## 2021-07-20 ENCOUNTER — Ambulatory Visit (HOSPITAL_COMMUNITY)
Admission: RE | Admit: 2021-07-20 | Discharge: 2021-07-20 | Disposition: A | Discharge status: Home / Self Care | Payer: Medicare Other | Source: Ambulatory Visit | Attending: Surgery | Admitting: Surgery

## 2021-07-20 ENCOUNTER — Ambulatory Visit (INDEPENDENT_AMBULATORY_CARE_PROVIDER_SITE_OTHER)
Admission: RE | Admit: 2021-07-20 | Discharge: 2021-07-20 | Disposition: A | Discharge status: Home / Self Care | Payer: Medicare Other | Source: Ambulatory Visit | Attending: Surgery | Admitting: Surgery

## 2021-07-20 ENCOUNTER — Ambulatory Visit (INDEPENDENT_AMBULATORY_CARE_PROVIDER_SITE_OTHER): Payer: Medicare Other | Admitting: Physician Assistant

## 2021-07-20 VITALS — BP 162/70 | HR 68 | Temp 97.2°F | Resp 20 | Ht 69.0 in | Wt 286.0 lb

## 2021-07-20 DIAGNOSIS — I739 Peripheral vascular disease, unspecified: Secondary | ICD-10-CM | POA: Diagnosis present

## 2021-07-20 NOTE — Progress Notes (Signed)
HISTORY AND PHYSICAL     CC:  follow up. Requesting Provider:  [name], MD  HPI: This is a 75 y.o. male who is here today for follow up for PAD.  He has hx of left axillobifemoral bypass graft and left femoral endarterectomy on 05-30-18 by Dr. [name] for critical limb ischemia bilaterally.  Pt was last seen 06/01/2019 and at that time, his legs were much improved and his walking had improved.  He did not have any non healing wounds.   The pt returns today for follow up.  He states that he saw his heart doctor and was scheduled to f/u with [place] since it had been a while.  He states he really isn't having any issues.  He states that his legs get tired after he walks a while.  This happens up in his thighs and they are equal.  Otherwise, he does not have any rest pain or non healing wounds.    The pt is on a statin for cholesterol management.    The pt is on an aspirin.    Other AC:  Plavix The pt is on CCB, BB for hypertension.  The pt does have diabetes. Tobacco hx:  former  Pt does not have family hx of AAA.  Past Medical History:  Diagnosis Date   Arthritis    Chronic kidney disease    Stage 2   CVA (cerebral vascular accident) ([place]) 10/2014    Late effect of cerebrovascular disease.  Mostly resolved, but can see difference in facial expressions.  tp-A, Plavix afterward   Diabetes mellitus without complication ([place])    type II, controlled, with renal comps   Diabetic foot ([place]) 04/05/2018   Normal sensation, skin intact   Diverticulosis    ED (erectile dysfunction)    GI bleed 04/2017   Gout    History of claustrophobia    with MRI   Hx of adenomatous polyp of rectum 06/10/2017   Hypercholesterolemia    Hypertension    Long term (current) use of insulin ([place])    Remains on basal/bolus therapy without hypoglycemia.   Microalbuminuria 2013   Morbid obesity ([place])    Obesity    OSA (obstructive sleep apnea)    Intolerant to CPAP   Peripheral vascular disease ([place])     Right calf pain 04/05/2018   Vertigo     Past Surgical History:  Procedure Laterality Date   ABDOMINAL AORTOGRAM N/A 04/13/2018   Procedure: ABDOMINAL AORTOGRAM;  Surgeon: Conrad Pinehill, MD;  Location: [facility];  Service: Cardiovascular;  Laterality: N/A;   APPLICATION OF WOUND VAC Bilateral 05/30/2018   Procedure: APPLICATION OF WOUND VAC;  Surgeon: Conrad Mehlville, MD;  Location: [facility];  Service: Vascular;  Laterality: Bilateral;   AXILLARY-FEMORAL BYPASS GRAFT Left 05/30/2018   Procedure: BYPASS GRAFT LEFT AXILLA-BIFEMORAL WITH HEMO SHIELD GOLD VASCULAR GRAFTS;  Surgeon: Conrad Sumatra, MD;  Location: [facility];  Service: Vascular;  Laterality: Left;   CATARACT EXTRACTION W/PHACO Right 07/03/2021   Procedure: CATARACT EXTRACTION PHACO AND INTRAOCULAR LENS PLACEMENT ([place]);  Surgeon: Baruch Goldmann, MD;  Location: [facility];  Service: Ophthalmology;  Laterality: Right;  CDE 6.99   CATARACT EXTRACTION W/PHACO Left 07/17/2021   Procedure: CATARACT EXTRACTION PHACO AND INTRAOCULAR LENS PLACEMENT ([facility]);  Surgeon: Baruch Goldmann, MD;  Location: [facility];  Service: Ophthalmology;  Laterality: Left;  CDE 6.92   COLONOSCOPY WITH PROPOFOL N/A 06/08/2017   Procedure: COLONOSCOPY WITH PROPOFOL ( [place]);  Surgeon: Gatha Mayer, MD;  Location: [facility];  Service: Endoscopy;  Laterality: N/A;   ENDARTERECTOMY FEMORAL Bilateral 05/30/2018   Procedure: ENDARTERECTOMY FEMORAL LEFT;  Surgeon: Conrad Clarita, MD;  Location: [facility];  Service: Vascular;  Laterality: Bilateral;   INTRAOPERATIVE ARTERIOGRAM Bilateral 05/30/2018   Procedure: INTRA OPERATIVE ARTERIOGRAM BILATERAL LOWER EXTREMITIES;  Surgeon: Conrad Corona, MD;  Location: [facility];  Service: Vascular;  Laterality: Bilateral;   LOWER EXTREMITY ANGIOGRAPHY N/A 04/13/2018   Procedure: LOWER EXTREMITY ANGIOGRAPHY;  Surgeon: Conrad Petersburg, MD;  Location: [facility];  Service: Cardiovascular;  Laterality: N/A;  bilateral    UMBILICAL HERNIA  REPAIR      No Known Allergies  Current Outpatient Medications  Medication Sig Dispense Refill   Accu-Chek FastClix Lancets MISC      ACCU-CHEK GUIDE test strip USE STRIP TO CHECK GLUCOSE THREE TIMES DAILY     acetaminophen (TYLENOL) 650 MG CR tablet Take 1,300 mg by mouth every 8 (eight) hours as needed for pain.     amLODipine (NORVASC) 10 MG tablet Take 10 mg by mouth daily.     aspirin EC 81 MG tablet Take 81 mg by mouth daily.     atorvastatin (LIPITOR) 80 MG tablet Take 80 mg by mouth daily.     clopidogrel (PLAVIX) 75 MG tablet Take 75 mg by mouth daily.     colchicine 0.6 MG tablet Take 0.6 mg by mouth daily as needed (gout).     ezetimibe (ZETIA) 10 MG tablet Take 10 mg by mouth daily.     HUMALOG KWIKPEN 100 UNIT/ML KwikPen Inject 8 Units into the skin 2 (two) times daily before a meal.     Insulin Pen Needle (B-D ULTRAFINE III SHORT PEN) 31G X 8 MM MISC use pen needs 4 times a day with insulin  (DX: ICD10: E11.51)     Insulin Syringe-Needle U-100 31G X 5/16" 1 ML MISC use one needle once daily     LANTUS SOLOSTAR 100 UNIT/ML Solostar Pen Inject 28 Units into the skin 2 (two) times daily.     losartan-hydrochlorothiazide (HYZAAR) 100-25 MG per tablet Take 1 tablet by mouth daily.     metoprolol tartrate (LOPRESSOR) 50 MG tablet Take 25 mg by mouth 2 (two) times daily.     Multiple Vitamins-Minerals (EMERGEN-C IMMUNE PLUS) PACK Take 1 Scoop by mouth daily.     nitroGLYCERIN (NITROSTAT) 0.4 MG SL tablet Place 1 tablet (0.4 mg total) under the tongue every 5 (five) minutes as needed for chest pain. 25 tablet 3   Oxymetazoline HCl (VICKS SINEX 12 HOUR NA) Place 1 spray into both nostrils daily as needed (congestion).     tamsulosin (FLOMAX) 0.4 MG CAPS capsule Take 0.4 mg by mouth at bedtime.     No current facility-administered medications for this visit.    Family History  Problem Relation Age of Onset   CVA Mother    Alzheimer's disease Mother    Heart attack Father     Heart disease Father    Diabetes Brother    Rectal cancer Brother    Stomach cancer Neg Hx    Colon cancer Neg Hx     Social History   Socioeconomic History   Marital status: Married    Spouse name: [name]   Number of children: 2   Years of education: hs gr   Highest education level: Not on file  Occupational History   Occupation: retired    Comment:  Adjuster  Tobacco Use   Smoking status: Former    Packs/day: 1.00    Years: 15.00    Pack years: 15.00    Types: Cigarettes    Quit date: 06/06/1996    Years since quitting: 25.1   Smokeless tobacco: Never   Tobacco comments:    Quit 20 years ago  Vaping Use   Vaping Use: Never used  Substance and Sexual Activity   Alcohol use: No    Alcohol/week: 0.0 standard drinks   Drug use: No   Sexual activity: Not Currently    Partners: Female    Birth control/protection: None  Other Topics Concern   Not on file  Social History Narrative   Patient is married with 2 children.   Patient is right handed.   Patient has hs education.   Patient drinks tea on sundays, sodas occ.   Social Determinants of Health   Financial Resource Strain: Not on file  Food Insecurity: Not on file  Transportation Needs: Not on file  Physical Activity: Not on file  Stress: Not on file  Social Connections: Not on file  Intimate Partner Violence: Not on file     REVIEW OF SYSTEMS:   [X]  denotes positive finding, [ ]  denotes negative finding Cardiac  Comments:  Chest pain or chest pressure:    Shortness of breath upon exertion:    Short of breath when lying flat:    Irregular heart rhythm:        Vascular    Pain in calf, thigh, or hip brought on by ambulation:    Pain in feet at night that wakes you up from your sleep:     Blood clot in your veins:    Leg swelling:         Pulmonary    Oxygen at home:    Productive cough:     Wheezing:         Neurologic    Sudden weakness in arms or legs:     Sudden numbness in arms or legs:      Sudden onset of difficulty speaking or slurred speech:    Temporary loss of vision in one eye:     Problems with dizziness:         Gastrointestinal    Blood in stool:     Vomited blood:         Genitourinary    Burning when urinating:     Blood in urine:        Psychiatric    Major depression:         Hematologic    Bleeding problems:    Problems with blood clotting too easily:        Skin    Rashes or ulcers:        Constitutional    Fever or chills:      PHYSICAL EXAMINATION:  Today's Vitals   07/20/21 1149  BP: (!) 162/70  Pulse: 68  Resp: 20  Temp: (!) 97.2 F (36.2 C)  TempSrc: Temporal  SpO2: 98%  Weight: 286 lb (129.7 kg)  Height: 5\' 9"  (1.753 m)   Body mass index is 42.23 kg/m.   General:  WDWN in NAD; vital signs documented above Gait: Normal HENT: WNL, normocephalic Pulmonary: normal non-labored breathing , without wheezing Cardiac: regular HR, without  Murmur; without carotid bruits Abdomen: soft, obese NT, no masses; aortic pulse is not palpable Skin: without rashes Vascular Exam/Pulses:  Right Left  Radial 2+ (  normal) 2+ (normal)  Popliteal Unable to palpate Unable to palpate  DP Unable to obtain Unable to obtain  PT Monophasic brisk doppler Monophasic brisk doppler  Peroneal Monophasic brisk doppler Monophasic doppler   Extremities: without ischemic changes, without Gangrene , without cellulitis; without open wounds; unable to palpate ax-fem graft due to obesity Musculoskeletal: no muscle wasting or atrophy  Neurologic: A&O X 3;  No focal weakness or paresthesias are detected Psychiatric:  The pt has Normal affect.   Non-Invasive Vascular Imaging:   ABI's/TBI's on 07/20/2021: Right:  0.55/0.55 - Great toe pressure: 97 Left:  0.7/0.69 - Great toe pressure: 120  Arterial duplex on 07/20/2021: Right Graft #1: Left to right fem-fem  +------------------+--------+--------+----------+--------+                    PSV  cm/sStenosisWaveform  Comments  +------------------+--------+--------+----------+--------+  Inflow            104             triphasic           +------------------+--------+--------+----------+--------+  Prox Anastomosis  145             triphasic           +------------------+--------+--------+----------+--------+  Proximal Graft    100             monophasic          +------------------+--------+--------+----------+--------+  Mid Graft         55              monophasic          +------------------+--------+--------+----------+--------+  Distal Graft      63              monophasic          +------------------+--------+--------+----------+--------+  Distal Anastomosis62              biphasic            +------------------+--------+--------+----------+--------+  Outflow           89              triphasic           +------------------+--------+--------+----------+--------+   Left Graft #1: Axillary to femoral artery  +--------------------+--------+--------+----------+--------+                      PSV cm/sStenosisWaveform  Comments  +--------------------+--------+--------+----------+--------+  Inflow              308             monophasic          +--------------------+--------+--------+----------+--------+  Proximal Anastomosis208             monophasic          +--------------------+--------+--------+----------+--------+  Proximal Graft      258             monophasic          +--------------------+--------+--------+----------+--------+  Mid Graft           71              monophasic          +--------------------+--------+--------+----------+--------+  Distal Graft        67              triphasic           +--------------------+--------+--------+----------+--------+  Distal Anastomosis  113  triphasic           +--------------------+--------+--------+----------+--------+   Outflow             108             monophasic          +--------------------+--------+--------+----------+--------+   Summary:  Right: Patent left to right fem-fem bypass graft with no obvious stenosis.   Left: Patent left ax-fem bypass graft with elevated velocities at the  inflow which are consistent with >50% stenosis.    Previous ABI's/TBI's on 05/28/2019: Right:  0.62/0.58 - Great toe pressure: 88 Left:  0.65/0.68 - Great toe pressure:  104  Previous arterial duplex on 05/28/2019: Left Graft #1: Axillary artery to femoral artery bypass graft  +--------------------+--------+--------+----------+---------+                      PSV cm/sStenosisWaveform  Comments   +--------------------+--------+--------+----------+---------+  Inflow              246             monophasic           +--------------------+--------+--------+----------+---------+  Proximal Anastomosis184             monophasicTurbulent  +--------------------+--------+--------+----------+---------+  Proximal Graft      150             monophasicTurbulent  +--------------------+--------+--------+----------+---------+  Mid Graft           56              monophasic           +--------------------+--------+--------+----------+---------+  Distal Graft        70              monophasic           +--------------------+--------+--------+----------+---------+  Distal Anastamosis  177             monophasic           +--------------------+--------+--------+----------+---------+  Outflow             201             monophasic           +--------------------+--------+--------+----------+---------+   Left Graft #2: Left to right femoral artery to femoral artery bypass graft  +--------------------+--------+--------+----------+--------+                      PSV cm/sStenosisWaveform  Comments  +--------------------+--------+--------+----------+--------+  Inflow               218             monophasic          +--------------------+--------+--------+----------+--------+  Proximal Anastomosis180             monophasic          +--------------------+--------+--------+----------+--------+  Proximal Graft      121             monophasic          +--------------------+--------+--------+----------+--------+  Mid Graft           44              monophasic          +--------------------+--------+--------+----------+--------+  Distal Graft        43              monophasic          +--------------------+--------+--------+----------+--------+  Distal  Anastomosis  109             monophasic          +--------------------+--------+--------+----------+--------+  Outflow             83              monophasic          +--------------------+--------+--------+----------+--------+  Summary:  Left Graft(s): Patent left axillary artery to left femoral artery bypass  graft. Elevated velocities at inflow artery may indicate stenosis of  50-74%; Plaque morphology not visualized to support stenosis criteria.   Patent left to right femoral artery bypass graft without evidence of  stenosis.    ASSESSMENT/PLAN:: 75 y.o. male here for follow up for hx of left axillobifemoral bypass graft and left femoral endarterectomy on 05-30-18 by Dr. [name] for critical limb ischemia bilaterally.  -pt with good doppler flow bilaterally and good toe pressures.  His ABI's are essentially unchanged.  On the inflow to his ax-bifem, he does have elevated velocities.  I discussed this with Dr. Trula Slade and given the pt is not having rest pain or non healing wounds, would continue to encourage pt on a graduated walking program and continue his asa/statin.  We will see him back in 6 months with repeat studies.  He will call sooner if he develops any rest pain or non healing wounds.      Leontine Locket, [facility] [phone]  Clinic MD:    Trula Slade

## 2021-07-23 ENCOUNTER — Other Ambulatory Visit: Payer: Self-pay

## 2021-07-23 DIAGNOSIS — I739 Peripheral vascular disease, unspecified: Secondary | ICD-10-CM

## 2021-08-03 ENCOUNTER — Ambulatory Visit: Payer: Medicare Other | Admitting: Podiatry

## 2021-08-03 ENCOUNTER — Other Ambulatory Visit: Payer: Self-pay

## 2021-08-03 DIAGNOSIS — M79675 Pain in left toe(s): Secondary | ICD-10-CM

## 2021-08-03 DIAGNOSIS — E1151 Type 2 diabetes mellitus with diabetic peripheral angiopathy without gangrene: Secondary | ICD-10-CM

## 2021-08-03 DIAGNOSIS — M79674 Pain in right toe(s): Secondary | ICD-10-CM

## 2021-08-03 DIAGNOSIS — B351 Tinea unguium: Secondary | ICD-10-CM

## 2021-08-06 ENCOUNTER — Encounter: Payer: Self-pay | Admitting: Podiatry

## 2021-08-06 NOTE — Progress Notes (Signed)
  Subjective:  Patient ID: Jacob Avery, male    DOB: 03/03/1946,  MRN: [id number]  Jacob Avery presents to clinic today for at risk foot care. Pt has h/o NIDDM with PAD and thick, elongated toenails b/l feet which are tender when wearing enclosed shoe gear.  Patient states blood glucose was 112 mg/dl today.    PCP is [name], Fransico Him, MD , and last visit was 2-3 weeks ago.  No Known Allergies  Review of Systems: Negative except as noted in the HPI. Objective:   Constitutional Jacob Avery is a pleasant 75 y.o. African American male, obese in NAD. AAO x 3.   Vascular Capillary fill time to digits <3 seconds b/l lower extremities. Nonpalpable pedal pulse(s) b/l lower extremities. Pedal hair absent. Lower extremity skin temperature gradient within normal limits. No pain with calf compression b/l. No cyanosis or clubbing noted.  Neurologic Normal speech. Oriented to person, place, and time. Protective sensation intact 5/5 intact bilaterally with 10g monofilament b/l. Vibratory sensation intact b/l.  Dermatologic Skin warm and supple b/l lower extremities. No open wounds b/l lower extremities. No interdigital macerations b/l lower extremities. Toenails 1-5 b/l elongated, discolored, dystrophic, thickened, crumbly with subungual debris and tenderness to dorsal palpation.  Orthopedic: Normal muscle strength 5/5 to all lower extremity muscle groups bilaterally. Hallux valgus with bunion deformity noted b/l lower extremities. Hammertoe(s) noted to the 2-5 bilaterally.   Radiographs: None Assessment:   1. Pain due to onychomycosis of toenails of both feet   2. Type II diabetes mellitus with peripheral circulatory disorder [facility])    Plan:  Patient was evaluated and treated and all questions answered. Consent given for treatment as described below: -Examined patient. -Continue diabetic foot care principles: inspect feet daily, monitor glucose as recommended by PCP and/or Endocrinologist, and  follow prescribed diet per PCP, Endocrinologist and/or dietician. -Patient to continue soft, supportive shoe gear daily. -Toenails 1-5 b/l were debrided in length and girth with sterile nail nippers and dremel without iatrogenic bleeding.  -Patient to report any pedal injuries to medical professional immediately. -Patient/POA to call should there be question/concern in the interim.  Return in about 3 months (around 11/03/2021).  Marzetta Board, DPM

## 2021-11-10 ENCOUNTER — Ambulatory Visit (INDEPENDENT_AMBULATORY_CARE_PROVIDER_SITE_OTHER): Payer: Medicare Other | Admitting: Podiatry

## 2021-11-10 ENCOUNTER — Other Ambulatory Visit: Payer: Self-pay

## 2021-11-10 ENCOUNTER — Encounter: Payer: Self-pay | Admitting: Podiatry

## 2021-11-10 DIAGNOSIS — M2011 Hallux valgus (acquired), right foot: Secondary | ICD-10-CM

## 2021-11-10 DIAGNOSIS — M2041 Other hammer toe(s) (acquired), right foot: Secondary | ICD-10-CM

## 2021-11-10 DIAGNOSIS — M2042 Other hammer toe(s) (acquired), left foot: Secondary | ICD-10-CM

## 2021-11-10 DIAGNOSIS — E1151 Type 2 diabetes mellitus with diabetic peripheral angiopathy without gangrene: Secondary | ICD-10-CM | POA: Diagnosis not present

## 2021-11-10 DIAGNOSIS — I739 Peripheral vascular disease, unspecified: Secondary | ICD-10-CM | POA: Diagnosis not present

## 2021-11-10 DIAGNOSIS — B351 Tinea unguium: Secondary | ICD-10-CM | POA: Diagnosis not present

## 2021-11-10 DIAGNOSIS — E119 Type 2 diabetes mellitus without complications: Secondary | ICD-10-CM

## 2021-11-10 DIAGNOSIS — M79675 Pain in left toe(s): Secondary | ICD-10-CM | POA: Diagnosis not present

## 2021-11-10 DIAGNOSIS — M2012 Hallux valgus (acquired), left foot: Secondary | ICD-10-CM

## 2021-11-10 DIAGNOSIS — M79674 Pain in right toe(s): Secondary | ICD-10-CM

## 2021-11-15 NOTE — Progress Notes (Signed)
ANNUAL DIABETIC FOOT EXAM  Subjective: Jacob Avery presents today for for annual diabetic foot examination, at risk foot care. Pt has h/o NIDDM with PAD, and painful elongated mycotic toenails 1-5 bilaterally which are tender when wearing enclosed shoe gear. Pain is relieved with periodic professional debridement..  Patient denies any h/o foot wounds.  Patient denies any numbness, tingling, burning, or pins/needle sensation in feet.  Patient's blood sugar was 123 mg/dl today.   Patient states he has thigh and calf cramping when walking. In terms of walking blocks, patient states he cannot walk 1/2 block. He is followed by Vascular. He notes no wounds or rest pain on today's visit.  Risk factors:  h/o CVA, CKD, h/o critical limb ischemia, diabetes, PAD, hyperlipidemia.  [name], Fransico Him, MD is patient's PCP. Last visit was September, 2022.  Past Medical History:  Diagnosis Date   Arthritis    Chronic kidney disease    Stage 2   CVA (cerebral vascular accident) ([place]) 10/2014    Late effect of cerebrovascular disease.  Mostly resolved, but can see difference in facial expressions.  tp-A, Plavix afterward   Diabetes mellitus without complication ([place])    type II, controlled, with renal comps   Diabetic foot ([place]) 04/05/2018   Normal sensation, skin intact   Diverticulosis    ED (erectile dysfunction)    GI bleed 04/2017   Gout    History of claustrophobia    with MRI   Hx of adenomatous polyp of rectum 06/10/2017   Hypercholesterolemia    Hypertension    Long term (current) use of insulin ([place])    Remains on basal/bolus therapy without hypoglycemia.   Microalbuminuria 2013   Morbid obesity ([place])    Obesity    OSA (obstructive sleep apnea)    Intolerant to CPAP   Peripheral vascular disease ([place])    Right calf pain 04/05/2018   Vertigo    Patient Active Problem List   Diagnosis Date Noted   Annual physical exam 01/13/2021   Benign prostatic hyperplasia with lower  urinary tract symptoms 12/19/2018   Hypertensive heart and renal disease 12/19/2018   PAD (peripheral artery disease) ([place]) 05/30/2018   Critical lower limb ischemia ([place]) 04/10/2018   Chronic kidney disease, stage 2 (mild) 12/14/2017   Hx of adenomatous polyp of rectum 06/10/2017   Long term (current) use of insulin ([place]) 08/19/2015   Sequelae of cerebrovascular disease 08/19/2015   HLD (hyperlipidemia) 01/07/2015   Obstructive sleep apnea 01/07/2015   Sleep apnea 12/11/2014   Snoring 12/11/2014   Severe obesity (BMI >= 40) ([place]) 39/12/90   Embolic stroke involving right carotid artery ([place]) 12/11/2014   Palpitations 10/29/2014   Carotid stenosis    Essential hypertension 10/28/2014   Hyperlipidemia LDL goal <70 10/28/2014   Morbid obesity ([place]) 10/28/2014   Diabetes ([place]) 10/28/2014   likely OSA (obstructive sleep apnea) 10/28/2014   Cerebral infarction due to occlusion or stenosis of precerebral artery ([place]) 10/25/2014   Proteinuria 04/03/2012   Gout 06/14/2011   Hyperglycemia due to type 2 diabetes mellitus ([place]) 06/03/2010   OTHER SPECIFIED INTESTINAL OBSTRUCTION 12/22/2009   DIVERTICULOSIS-COLON 12/22/2009   Diabetic renal disease ([place]) 05/25/2009   Male erectile disorder 05/25/2009   Pure hypercholesterolemia 05/25/2009   Past Surgical History:  Procedure Laterality Date   ABDOMINAL AORTOGRAM N/A 04/13/2018   Procedure: ABDOMINAL AORTOGRAM;  Surgeon: Conrad Clearwater, MD;  Location: [facility];  Service: Cardiovascular;  Laterality: N/A;   APPLICATION OF WOUND VAC  Bilateral 05/30/2018   Procedure: APPLICATION OF WOUND VAC;  Surgeon: Conrad Escudilla Bonita, MD;  Location: [facility];  Service: Vascular;  Laterality: Bilateral;   AXILLARY-FEMORAL BYPASS GRAFT Left 05/30/2018   Procedure: BYPASS GRAFT LEFT AXILLA-BIFEMORAL WITH HEMO SHIELD GOLD VASCULAR GRAFTS;  Surgeon: Conrad [place]cone[place], MD;  Location: [facility];  Service: Vascular;  Laterality: Left;   CATARACT EXTRACTION W/PHACO Right  07/03/2021   Procedure: CATARACT EXTRACTION PHACO AND INTRAOCULAR LENS PLACEMENT ([place]);  Surgeon: Baruch Goldmann, MD;  [facility];  Service: Ophthalmology;  Laterality: Right;  CDE 6.99   CATARACT EXTRACTION W/PHACO Left 07/17/2021   Procedure: CATARACT EXTRACTION PHACO AND INTRAOCULAR LENS PLACEMENT ([facility]);  Surgeon: Baruch Goldmann, MD;  [facility]: [facility];  Service: Ophthalmology;  Laterality: Left;  CDE 6.92   COLONOSCOPY WITH PROPOFOL N/A 06/08/2017   Procedure: COLONOSCOPY WITH PROPOFOL ( Ultra-Slim Scope);  Surgeon: Gatha Mayer, MD;  Location: [facility];  Service: Endoscopy;  Laterality: N/A;   ENDARTERECTOMY FEMORAL Bilateral 05/30/2018   Procedure: ENDARTERECTOMY FEMORAL LEFT;  Surgeon: Conrad [place], MD;  Location: [facility];  Service: Vascular;  Laterality: Bilateral;   INTRAOPERATIVE ARTERIOGRAM Bilateral 05/30/2018   Procedure: INTRA OPERATIVE ARTERIOGRAM BILATERAL LOWER EXTREMITIES;  Surgeon: Conrad Elmo, MD;  Location: [facility];  Service: Vascular;  Laterality: Bilateral;   LOWER EXTREMITY ANGIOGRAPHY N/A 04/13/2018   Procedure: LOWER EXTREMITY ANGIOGRAPHY;  Surgeon: Conrad Noble, MD;  Location: [facility];  Service: Cardiovascular;  Laterality: N/A;  bilateral    UMBILICAL HERNIA REPAIR     Current Outpatient Medications on File Prior to Visit  Medication Sig Dispense Refill   Accu-Chek FastClix Lancets MISC      ACCU-CHEK GUIDE test strip USE STRIP TO CHECK GLUCOSE THREE TIMES DAILY     acetaminophen (TYLENOL) 650 MG CR tablet Take 1,300 mg by mouth every 8 (eight) hours as needed for pain.     amLODipine (NORVASC) 10 MG tablet Take 10 mg by mouth daily.     aspirin EC 81 MG tablet Take 81 mg by mouth daily.     atorvastatin (LIPITOR) 80 MG tablet Take 80 mg by mouth daily.     clopidogrel (PLAVIX) 75 MG tablet Take 75 mg by mouth daily.     colchicine 0.6 MG tablet Take 0.6 mg by mouth daily as needed (gout).     ezetimibe (ZETIA) 10 MG tablet Take 10 mg by mouth  daily.     HUMALOG KWIKPEN 100 UNIT/ML KwikPen Inject 8 Units into the skin 2 (two) times daily before a meal.     Insulin Pen Needle (B-D ULTRAFINE III SHORT PEN) 31G X 8 MM MISC use pen needs 4 times a day with insulin  (DX: ICD10: E11.51)     Insulin Syringe-Needle U-100 31G X 5/16" 1 ML MISC use one needle once daily     LANTUS SOLOSTAR 100 UNIT/ML Solostar Pen Inject 28 Units into the skin 2 (two) times daily.     losartan-hydrochlorothiazide (HYZAAR) 100-25 MG per tablet Take 1 tablet by mouth daily.     metoprolol tartrate (LOPRESSOR) 50 MG tablet Take 25 mg by mouth 2 (two) times daily.     Multiple Vitamins-Minerals (EMERGEN-C IMMUNE PLUS) PACK Take 1 Scoop by mouth daily.     nitroGLYCERIN (NITROSTAT) 0.4 MG SL tablet Place 1 tablet (0.4 mg total) under the tongue every 5 (five) minutes as needed for chest pain. 25 tablet 3   omeprazole (PRILOSEC) 20 MG capsule Take  20 mg by mouth daily.     Oxymetazoline HCl (VICKS SINEX 12 HOUR NA) Place 1 spray into both nostrils daily as needed (congestion).     tamsulosin (FLOMAX) 0.4 MG CAPS capsule Take 0.4 mg by mouth at bedtime.     No current facility-administered medications on file prior to visit.    No Known Allergies Social History   Occupational History   Occupation: retired    Comment: IT consultant  Tobacco Use   Smoking status: Former    Packs/day: 1.00    Years: 15.00    Pack years: 15.00    Types: Cigarettes    Quit date: 06/06/1996    Years since quitting: 25.4   Smokeless tobacco: Never   Tobacco comments:    Quit 20 years ago  Vaping Use   Vaping Use: Never used  Substance and Sexual Activity   Alcohol use: No    Alcohol/week: 0.0 standard drinks   Drug use: No   Sexual activity: Not Currently    Partners: Female    Birth control/protection: None   Family History  Problem Relation Age of Onset   CVA Mother    Alzheimer's disease Mother    Heart attack Father    Heart disease Father    Diabetes Brother     Rectal cancer Brother    Stomach cancer Neg Hx    Colon cancer Neg Hx    Immunization History  Administered Date(s) Administered   Tdap 04/11/2019     Review of Systems: Negative except as noted in the HPI.   Objective: There were no vitals filed for this visit.  Jacob Avery is a pleasant 76 y.o. male in NAD. AAO X 3.  Vascular Examination: CFT <3 seconds b/l LE. Nonpalpable pedal pulses b/l. Pedal hair absent b/l. Skin temperature gradient WNL b/l. No pain with calf compression b/l. No edema b/l LE. No cyanosis or clubbing noted b/l LE. No ischemia or gangrene noted b/l LE.  Dermatological Examination: Pedal integument with normal turgor, texture and tone b/l LE. No open wounds b/l. No interdigital macerations b/l. Toenails 1-5 b/l elongated, thickened, discolored with subungual debris. +Tenderness with dorsal palpation of nailplates. No hyperkeratotic or porokeratotic lesions present.  Musculoskeletal Examination: Normal muscle strength 5/5 to all lower extremity muscle groups bilaterally. HAV with bunion deformity noted b/l LE. Hammertoe deformity noted 2-5 b/l.Marland Kitchen No pain, crepitus or joint limitation noted with ROM b/l LE.  Patient ambulates independently without assistive aids.  Footwear Assessment: Does the patient wear appropriate shoes? Yes. Does the patient need inserts/orthotics? Yes.  Neurological Examination: Protective sensation intact 5/5 intact bilaterally with 10g monofilament b/l. Vibratory sensation intact b/l. Proprioception intact bilaterally.  Assessment: 1. Pain due to onychomycosis of toenails of both feet   2. Claudication in peripheral vascular disease (HCC)   3. Hallux valgus, acquired, bilateral   4. Acquired hammertoes of both feet   5. Type II diabetes mellitus with peripheral circulatory disorder (HCC)   6. Encounter for diabetic foot exam ([place])     ADA Risk Categorization: High Risk  Patient has one or more of the following: Loss of protective  sensation Absent pedal pulses Severe Foot deformity History of foot ulcer  Plan: -Patient is under the care of the Vascular Surgery team for PAD. Will forward findings today. -Diabetic foot examination performed today. -Patient/POA educated on dangers of using sharp instrumentation on toes/feet. Recommended continued professional foot care in presence of diabetes and PAD. Patient/POA relates understanding. -Mycotic toenails 1-5  bilaterally were debrided in length and girth with sterile nail nippers and dremel without iatrogenic bleeding. -Patient/POA to call should there be question/concern in the interim.  Return in about 3 months (around 02/08/2022).  Marzetta Board, DPM

## 2022-01-18 ENCOUNTER — Ambulatory Visit (INDEPENDENT_AMBULATORY_CARE_PROVIDER_SITE_OTHER)
Admission: RE | Admit: 2022-01-18 | Discharge: 2022-01-18 | Disposition: A | Discharge status: Home / Self Care | Payer: Medicare Other | Source: Ambulatory Visit | Attending: Surgery | Admitting: Surgery

## 2022-01-18 ENCOUNTER — Other Ambulatory Visit: Payer: Self-pay

## 2022-01-18 ENCOUNTER — Ambulatory Visit (INDEPENDENT_AMBULATORY_CARE_PROVIDER_SITE_OTHER): Payer: Medicare Other | Admitting: Physician Assistant

## 2022-01-18 ENCOUNTER — Ambulatory Visit (HOSPITAL_COMMUNITY)
Admission: RE | Admit: 2022-01-18 | Discharge: 2022-01-18 | Disposition: A | Discharge status: Home / Self Care | Payer: Medicare Other | Source: Ambulatory Visit | Attending: Surgery | Admitting: Surgery

## 2022-01-18 ENCOUNTER — Encounter (HOSPITAL_COMMUNITY): Payer: Self-pay

## 2022-01-18 VITALS — BP 187/79 | HR 42 | Temp 97.3°F | Resp 18 | Ht 69.5 in | Wt 286.0 lb

## 2022-01-18 DIAGNOSIS — I7409 Other arterial embolism and thrombosis of abdominal aorta: Secondary | ICD-10-CM

## 2022-01-18 DIAGNOSIS — I739 Peripheral vascular disease, unspecified: Secondary | ICD-10-CM

## 2022-01-18 NOTE — Progress Notes (Signed)
?Office Note  ? ? ? ?CC:  follow up ?Requesting Provider:  [name], MD ? ?HPI: Jacob Avery is a 76 y.o. ([birth year]) male who presents for surveillance of PAD.  He has history of left axillobifemoral bypass graft and left femoral endarterectomy on 05/30/2018 by Dr. [name] due to critical limb ischemia with rest pain bilaterally and known abdominal aortic dissection.  Patient states over the past several months he has noticed an increase in cramping in his calves, thighs, and buttocks after a short distance of walking.  He denies any rest pain in his feet or wounds.  Past medical history is also significant for insulin-dependent diabetes mellitus as well as obesity.  He denies tobacco use.  He is on aspirin, Plavix, statin daily. ? ? ?Past Medical History:  ?Diagnosis Date  ? Arthritis   ? Chronic kidney disease   ? Stage 2  ? CVA (cerebral vascular accident) ([place]) 10/2014  ?  Late effect of cerebrovascular disease.  Mostly resolved, but can see difference in facial expressions.  tp-A, Plavix afterward  ? Diabetes mellitus without complication ([place])   ? type II, controlled, with renal comps  ? Diabetic foot ([place]) 04/05/2018  ? Normal sensation, skin intact  ? Diverticulosis   ? ED (erectile dysfunction)   ? GI bleed 04/2017  ? Gout   ? History of claustrophobia   ? with MRI  ? Hx of adenomatous polyp of rectum 06/10/2017  ? Hypercholesterolemia   ? Hypertension   ? Long term (current) use of insulin ([place])   ? Remains on basal/bolus therapy without hypoglycemia.  ? Microalbuminuria 2013  ? Morbid obesity ([place])   ? Obesity   ? OSA (obstructive sleep apnea)   ? Intolerant to CPAP  ? Peripheral vascular disease ([place])   ? Right calf pain 04/05/2018  ? Vertigo   ? ? ?Past Surgical History:  ?Procedure Laterality Date  ? ABDOMINAL AORTOGRAM N/A 04/13/2018  ? Procedure: ABDOMINAL AORTOGRAM;  Surgeon: Conrad [place], MD;  Location: [facility];  Service: Cardiovascular;  Laterality: N/A;  ? APPLICATION OF WOUND  VAC Bilateral 05/30/2018  ? Procedure: APPLICATION OF WOUND VAC;  Surgeon: Conrad [place], MD;  Location: [facility];  Service: Vascular;  Laterality: Bilateral;  ? AXILLARY-FEMORAL BYPASS GRAFT Left 05/30/2018  ? Procedure: BYPASS GRAFT LEFT AXILLA-BIFEMORAL WITH HEMO SHIELD GOLD VASCULAR GRAFTS;  Surgeon: Conrad Andrews, MD;  Location: [facility];  Service: Vascular;  Laterality: Left;  ? CATARACT EXTRACTION W/PHACO Right 07/03/2021  ? Procedure: CATARACT EXTRACTION PHACO AND INTRAOCULAR LENS PLACEMENT (I[facility]);  Surgeon: Baruch Goldmann, MD;  [facility];  Service: Ophthalmology;  Laterality: Right;  CDE 6.99  ? CATARACT EXTRACTION W/PHACO Left 07/17/2021  ? Procedure: CATARACT EXTRACTION PHACO AND INTRAOCULAR LENS PLACEMENT (I[facility]);  Surgeon: Baruch Goldmann, MD;  [facility];  Service: Ophthalmology;  Laterality: Left;  CDE 6.92  ? COLONOSCOPY WITH PROPOFOL N/A 06/08/2017  ? Procedure: COLONOSCOPY WITH PROPOFOL ( Ultra-Slim Scope);  Surgeon: Gatha Mayer, MD;  Location: [facility];  Service: Endoscopy;  Laterality: N/A;  ? ENDARTERECTOMY FEMORAL Bilateral 05/30/2018  ? Procedure: ENDARTERECTOMY FEMORAL LEFT;  Surgeon: Conrad Winslow, MD;  Location: [facility];  Service: Vascular;  Laterality: Bilateral;  ? INTRAOPERATIVE ARTERIOGRAM Bilateral 05/30/2018  ? Procedure: INTRA OPERATIVE ARTERIOGRAM BILATERAL LOWER EXTREMITIES;  Surgeon: Conrad , MD;  Location: [facility];  Service: Vascular;  Laterality: Bilateral;  ? LOWER EXTREMITY ANGIOGRAPHY N/A 04/13/2018  ? Procedure: LOWER EXTREMITY  ANGIOGRAPHY;  Surgeon: Conrad [place], MD;  Location: [facility];  Service: Cardiovascular;  Laterality: N/A;  bilateral ?  ? UMBILICAL HERNIA REPAIR    ? ? ?Social History  ? ?Socioeconomic History  ? Marital status: Married  ?  Spouse name: [name]  ? Number of children: 2  ? Years of education: hs gr  ? Highest education level: Not on file  ?Occupational History  ? 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 Smoking status:  Former  ?  Packs/day: 1.00  ?  Years: 15.00  ?  Pack years: 15.00  ?  Types: Cigarettes  ?  Quit date: 06/06/1996  ?  Years since quitting: 25.6  ? Smokeless tobacco: Never  ? Tobacco comments:  ?  Quit 20 years ago  ?Vaping Use  ? Vaping Use: Never used  ?Substance and Sexual Activity  ? Alcohol use: No  ?  Alcohol/week: 0.0 standard drinks  ? Drug use: No  ? Sexual activity: Not Currently  ?  Partners: Female  ?  Birth control/protection: None  ?Other Topics Concern  ? Not on file  ?Social History Narrative  ? Patient is married with 2 children.  ? Patient is right handed.  ? Patient has hs education.  ? Patient drinks tea on sundays, sodas occ.  ? ?Social Determinants of Health  ? ?Financial Resource Strain: Not on file  ?Food Insecurity: Not on file  ?Transportation Needs: Not on file  ?Physical Activity: Not on file  ?Stress: Not on file  ?Social Connections: Not on file  ?Intimate Partner Violence: Not on file  ? ? ?Family History  ?Problem Relation Age of Onset  ? CVA Mother   ? Alzheimer's disease Mother   ? Heart attack Father   ? Heart disease Father   ? Diabetes Brother   ? Rectal cancer Brother   ? Stomach cancer Neg Hx   ? Colon cancer Neg Hx   ? ? ?Current Outpatient Medications  ?Medication Sig Dispense Refill  ? Accu-Chek FastClix Lancets MISC     ? ACCU-CHEK GUIDE test strip USE STRIP TO CHECK GLUCOSE THREE TIMES DAILY    ? acetaminophen (TYLENOL) 650 MG CR tablet Take 1,300 mg by mouth every 8 (eight) hours as needed for pain.    ? amLODipine (NORVASC) 10 MG tablet Take 10 mg by mouth daily.    ? aspirin EC 81 MG tablet Take 81 mg by mouth daily.    ? atorvastatin (LIPITOR) 80 MG tablet Take 80 mg by mouth daily.    ? clopidogrel (PLAVIX) 75 MG tablet Take 75 mg by mouth daily.    ? colchicine 0.6 MG tablet Take 0.6 mg by mouth daily as needed (gout).    ? ezetimibe (ZETIA) 10 MG tablet Take 10 mg by mouth daily.    ? HUMALOG KWIKPEN 100 UNIT/ML KwikPen Inject 8 Units into the skin 2 (two) times  daily before a meal.    ? Insulin Pen Needle (B-D ULTRAFINE III SHORT PEN) 31G X 8 MM MISC use pen needs 4 times a day with insulin  (DX: ICD10: E11.51)    ? Insulin Syringe-Needle U-100 31G X 5/16" 1 ML MISC use one needle once daily    ? LANTUS SOLOSTAR 100 UNIT/ML Solostar Pen Inject 28 Units into the skin 2 (two) times daily.    ? losartan-hydrochlorothiazide (HYZAAR) 100-25 MG per tablet Take 1 tablet by mouth daily.    ?  metoprolol tartrate (LOPRESSOR) 50 MG tablet Take 25 mg by mouth 2 (two) times daily.    ? Multiple Vitamins-Minerals (EMERGEN-C IMMUNE PLUS) PACK Take 1 Scoop by mouth daily.    ? nitroGLYCERIN (NITROSTAT) 0.4 MG SL tablet Place 1 tablet (0.4 mg total) under the tongue every 5 (five) minutes as needed for chest pain. 25 tablet 3  ? omeprazole (PRILOSEC) 20 MG capsule Take 20 mg by mouth daily.    ? Oxymetazoline HCl (VICKS SINEX 12 HOUR NA) Place 1 spray into both nostrils daily as needed (congestion).    ? tamsulosin (FLOMAX) 0.4 MG CAPS capsule Take 0.4 mg by mouth at bedtime.    ? ?No current facility-administered medications for this visit.  ? ? ?No Known Allergies ? ? ?REVIEW OF SYSTEMS:  ? ?'[X]'$  denotes positive finding, '[ ]'$  denotes negative finding ?Cardiac  Comments:  ?Chest pain or chest pressure:    ?Shortness of breath upon exertion:    ?Short of breath when lying flat:    ?Irregular heart rhythm:    ?    ?Vascular    ?Pain in calf, thigh, or hip brought on by ambulation:    ?Pain in feet at night that wakes you up from your sleep:     ?Blood clot in your veins:    ?Leg swelling:     ?    ?Pulmonary    ?Oxygen at home:    ?Productive cough:     ?Wheezing:     ?    ?Neurologic    ?Sudden weakness in arms or legs:     ?Sudden numbness in arms or legs:     ?Sudden onset of difficulty speaking or slurred speech:    ?Temporary loss of vision in one eye:     ?Problems with dizziness:     ?    ?Gastrointestinal    ?Blood in stool:     ?Vomited blood:     ?    ?Genitourinary    ?Burning  when urinating:     ?Blood in urine:    ?    ?Psychiatric    ?Major depression:     ?    ?Hematologic    ?Bleeding problems:    ?Problems with blood clotting too easily:    ?    ?Skin    ?Rashes or ulcers:

## 2022-01-22 ENCOUNTER — Other Ambulatory Visit: Payer: Self-pay | Admitting: *Deleted

## 2022-01-22 DIAGNOSIS — I739 Peripheral vascular disease, unspecified: Secondary | ICD-10-CM

## 2022-01-22 DIAGNOSIS — I7409 Other arterial embolism and thrombosis of abdominal aorta: Secondary | ICD-10-CM

## 2022-02-12 ENCOUNTER — Ambulatory Visit (INDEPENDENT_AMBULATORY_CARE_PROVIDER_SITE_OTHER): Payer: Medicare Other | Admitting: Podiatry

## 2022-02-12 ENCOUNTER — Encounter: Payer: Self-pay | Admitting: Podiatry

## 2022-02-12 DIAGNOSIS — M79674 Pain in right toe(s): Secondary | ICD-10-CM

## 2022-02-12 DIAGNOSIS — B351 Tinea unguium: Secondary | ICD-10-CM | POA: Diagnosis not present

## 2022-02-12 DIAGNOSIS — E1151 Type 2 diabetes mellitus with diabetic peripheral angiopathy without gangrene: Secondary | ICD-10-CM

## 2022-02-12 DIAGNOSIS — M79675 Pain in left toe(s): Secondary | ICD-10-CM | POA: Diagnosis not present

## 2022-02-12 DIAGNOSIS — L84 Corns and callosities: Secondary | ICD-10-CM

## 2022-02-21 NOTE — Progress Notes (Signed)
?  Subjective:  ?Patient ID: Jacob Avery, male    DOB: 1946/03/22,  MRN: [id number] ? ?Jacob Avery presents to clinic today for at risk foot care. Pt has h/o NIDDM with PAD and painful elongated mycotic toenails 1-5 bilaterally which are tender when wearing enclosed shoe gear. Pain is relieved with periodic professional debridement. ? ?New problem(s): None.  ? ?PCP is [name], Jacob Him, MD , and last visit was September, 2022. ? ?No Known Allergies ? ?Review of Systems: Negative except as noted in the HPI. ? ?Objective:  ?[name] is a pleasant 76 y.o. male in NAD. AAO X 3. ? ?Vascular Examination: ?CFT <3 seconds b/l LE. Nonpalpable pedal pulses b/l. Pedal hair absent b/l. Skin temperature gradient WNL b/l. No pain with calf compression b/l. No edema b/l LE. No cyanosis or clubbing noted b/l LE. No ischemia or gangrene noted b/l LE. ? ?Dermatological Examination: ?Pedal integument with normal turgor, texture and tone b/l LE. No open wounds b/l. No interdigital macerations b/l. Toenails 1-5 b/l elongated, thickened, discolored with subungual debris. +Tenderness with dorsal palpation of nailplates. Hyperkeratotic lesion(s) lateral nailfold b/l 5th digits consistent with Lister's corns.  No erythema, no edema, no drainage, no fluctuance.  ? ?Musculoskeletal Examination: ?Normal muscle strength 5/5 to all lower extremity muscle groups bilaterally. HAV with bunion deformity noted b/l LE. Hammertoe deformity noted 2-5 b/l.Marland Kitchen No pain, crepitus or joint limitation noted with ROM b/l LE.  Patient ambulates independently without assistive aids. ? ?Neurological Examination: ?Protective sensation intact 5/5 intact bilaterally with 10g monofilament b/l. Vibratory sensation intact b/l. Proprioception intact bilaterally. ? ?Assessment/Plan: ?1. Pain due to onychomycosis of toenails of both feet   ?2. Corns   ?3. Type II diabetes mellitus with peripheral circulatory disorder (HCC)   ?  ?-Patient was evaluated and treated. All  patient's and/or POA's questions/concerns answered on today's visit. ?-Continue foot and shoe inspections daily. Monitor blood glucose per PCP/Endocrinologist's recommendations. ?-Toenails 1-5 b/l were debrided in length and girth with sterile nail nippers and dremel without iatrogenic bleeding.  ?-Corn(s) bilateral 5th toes pared utilizing sterile scalpel blade without complication or incident. Total number debrided=2. ?-Patient/POA to call should there be question/concern in the interim.  ? ?Return in about 3 months (around 05/14/2022). ? ?Marzetta Board, DPM  ?

## 2022-05-18 ENCOUNTER — Ambulatory Visit: Payer: Medicare Other | Admitting: Podiatry

## 2022-07-26 ENCOUNTER — Ambulatory Visit (HOSPITAL_COMMUNITY)
Admission: RE | Admit: 2022-07-26 | Discharge: 2022-07-26 | Disposition: A | Discharge status: Home / Self Care | Payer: Medicare Other | Source: Ambulatory Visit | Attending: Surgery | Admitting: Surgery

## 2022-07-26 ENCOUNTER — Ambulatory Visit (INDEPENDENT_AMBULATORY_CARE_PROVIDER_SITE_OTHER): Payer: Medicare Other | Admitting: Physician Assistant

## 2022-07-26 VITALS — BP 174/60 | HR 44 | Temp 97.9°F | Resp 18 | Ht 69.0 in | Wt 278.0 lb

## 2022-07-26 DIAGNOSIS — I7409 Other arterial embolism and thrombosis of abdominal aorta: Secondary | ICD-10-CM | POA: Insufficient documentation

## 2022-07-26 DIAGNOSIS — I739 Peripheral vascular disease, unspecified: Secondary | ICD-10-CM

## 2022-07-26 NOTE — Progress Notes (Signed)
Established Previous Bypass   History of Present Illness   Jacob Avery is a 76 y.o. ([birth year]) male who presents for surveillance of PAD.  He has a history of left axillobifemoral bypass graft and left femoral endarterectomy on 05/30/2018 by Dr. [name].  This was done for critical limb ischemia with rest pain bilaterally and known abdominal aortic dissection.  At his last visit with [place] on 01/18/2022, the patient noted several months of increased cramping in his calves, thighs, and buttocks bilaterally after short distance of walking.  He denied any rest pain or wounds.  Previous duplex study demonstrated an occluded left axillofemoral bypass graft.  He had a soft monophasic right AT signal and monophasic left PT signal.  Since the patient was currently free of rest pain and tissue loss, the plan was to not pursue revascularization at that time.  He was encouraged to ambulate as much as possible to try to promote collateral formation.  At follow up today he states his claudication has not gotten worse or better since his last visit.  He can still walk to his mailbox and halfway back before he has to take a break.  He denies any rest pain or new wounds.  He is on aspirin, plavix, statin daily. Current Outpatient Medications  Medication Sig Dispense Refill   Accu-Chek FastClix Lancets MISC      ACCU-CHEK GUIDE test strip USE STRIP TO CHECK GLUCOSE THREE TIMES DAILY     acetaminophen (TYLENOL) 650 MG CR tablet Take 1,300 mg by mouth every 8 (eight) hours as needed for pain.     amLODipine (NORVASC) 10 MG tablet Take 10 mg by mouth daily.     aspirin EC 81 MG tablet Take 81 mg by mouth daily.     atorvastatin (LIPITOR) 80 MG tablet Take 80 mg by mouth daily.     atorvastatin (LIPITOR) 80 MG tablet Take 1 tablet by mouth daily.     clopidogrel (PLAVIX) 75 MG tablet Take 75 mg by mouth daily.     colchicine 0.6 MG tablet Take 0.6 mg by mouth daily as needed (gout).     ezetimibe (ZETIA) 10 MG  tablet Take 10 mg by mouth daily.     HUMALOG KWIKPEN 100 UNIT/ML KwikPen Inject 8 Units into the skin 2 (two) times daily before a meal.     Insulin Pen Needle (B-D ULTRAFINE III SHORT PEN) 31G X 8 MM MISC use pen needs 4 times a day with insulin  (DX: ICD10: E11.51)     Insulin Syringe-Needle U-100 31G X 5/16" 1 ML MISC use one needle once daily     LANTUS SOLOSTAR 100 UNIT/ML Solostar Pen Inject 28 Units into the skin 2 (two) times daily.     losartan-hydrochlorothiazide (HYZAAR) 100-25 MG per tablet Take 1 tablet by mouth daily.     metoprolol tartrate (LOPRESSOR) 50 MG tablet Take 25 mg by mouth 2 (two) times daily.     Multiple Vitamins-Minerals (EMERGEN-C IMMUNE PLUS) PACK Take 1 Scoop by mouth daily.     nitroGLYCERIN (NITROSTAT) 0.4 MG SL tablet Place 1 tablet (0.4 mg total) under the tongue every 5 (five) minutes as needed for chest pain. 25 tablet 3   omeprazole (PRILOSEC) 20 MG capsule Take 20 mg by mouth daily.     omeprazole (PRILOSEC) 20 MG capsule 1 capsule 30 minutes before morning meal     Oxymetazoline HCl (VICKS SINEX 12 HOUR NA) Place 1 spray into both nostrils daily as  needed (congestion).     tamsulosin (FLOMAX) 0.4 MG CAPS capsule Take 0.4 mg by mouth at bedtime.     No current facility-administered medications for this visit.    REVIEW OF SYSTEMS (negative unless checked):   Cardiac:  '[]'$  Chest pain or chest pressure? '[]'$  Shortness of breath upon activity? '[]'$  Shortness of breath when lying flat? '[]'$  Irregular heart rhythm?  Vascular:  '[x]'$  Pain in calf, thigh, or hip brought on by walking? '[]'$  Pain in feet at night that wakes you up from your sleep? '[]'$  Blood clot in your veins? '[]'$  Leg swelling?  Pulmonary:  '[]'$  Oxygen at home? '[]'$  Productive cough? '[]'$  Wheezing?  Neurologic:  '[]'$  Sudden weakness in arms or legs? '[]'$  Sudden numbness in arms or legs? '[]'$  Sudden onset of difficult speaking or slurred speech? '[]'$  Temporary loss of vision in one eye? '[]'$  Problems with  dizziness?  Gastrointestinal:  '[]'$  Blood in stool? '[]'$  Vomited blood?  Genitourinary:  '[]'$  Burning when urinating? '[]'$  Blood in urine?  Psychiatric:  '[]'$  Major depression  Hematologic:  '[]'$  Bleeding problems? '[]'$  Problems with blood clotting?  Dermatologic:  '[]'$  Rashes or ulcers?  Constitutional:  '[]'$  Fever or chills?  Ear/Nose/Throat:  '[]'$  Change in hearing? '[]'$  Nose bleeds? '[]'$  Sore throat?  Musculoskeletal:  '[]'$  Back pain? '[]'$  Joint pain? '[]'$  Muscle pain?   Physical Examination   Vitals:   07/26/22 1214  BP: (!) 174/60  Pulse: (!) 44  Resp: 18  Temp: 97.9 F (36.6 C)  TempSrc: Temporal  SpO2: 93%  Weight: 278 lb (126.1 kg)  Height: '5\' 9"'$  (1.753 m)   Body mass index is 41.05 kg/m.  General:  WDWN in NAD; vital signs documented above Gait: Not observed HENT: WNL, normocephalic Pulmonary: normal non-labored breathing , without rales, rhonchi,  wheezing Cardiac: regular HR, without murmurs without carotid bruit Abdomen: soft, NT, no masses Skin: without rashes Vascular Exam/Pulses: soft, monophasic PT doppler signals bilaterally Extremities: without ischemic changes, without Gangrene , without cellulitis; without open wounds;  Musculoskeletal: no muscle wasting or atrophy  Neurologic: A&O X 3;  No focal weakness or paresthesias are detected Psychiatric:  The pt has Normal affect.  Non-Invasive Vascular Imaging ABI (07/26/2022)  +---------+------------------+-----+----------+--------+  Right    Rt Pressure (mmHg)IndexWaveform  Comment   +---------+------------------+-----+----------+--------+  Brachial 180                                        +---------+------------------+-----+----------+--------+  PTA      80                0.44 monophasic          +---------+------------------+-----+----------+--------+  DP       79                0.43 monophasic          +---------+------------------+-----+----------+--------+  Great Toe56                 0.31 Abnormal            +---------+------------------+-----+----------+--------+   +---------+------------------+-----+----------+-------+  Left     Lt Pressure (mmHg)IndexWaveform  Comment  +---------+------------------+-----+----------+-------+  Brachial 182                                       +---------+------------------+-----+----------+-------+  PTA      86                0.47 monophasic         +---------+------------------+-----+----------+-------+  DP       80                0.44 monophasic         +---------+------------------+-----+----------+-------+  Great Toe75                0.41 Normal             +---------+------------------+-----+----------+-------+   +-------+-----------+-----------+------------+------------+  ABI/TBIToday's ABIToday's TBIPrevious ABIPrevious TBI  +-------+-----------+-----------+------------+------------+  Right  0.44       0.31       0.30        absent        +-------+-----------+-----------+------------+------------+  Left   0.47       0.41       0.46        0.27          +-------+-----------+-----------+------------+------------+    Medical Decision Making   Jacob Avery is a 76 y.o. male who presents for routine surveillance of PAD with hx of left axillobifemoral bypass graft. This graft is known to be occluded.  Based on the ABIs done today, the patient has slightly increased R ABI from 0.30 on 01/18/2022 to 0.44 today. His L ABI is essentially unchanged from 0.46 to 0.47. His TBIs have both increased since his last visit. He still endorses the same level of claudication in his buttocks, thighs, and calves bilaterally as he did 6 months ago. He is free of rest pain and tissue loss. I have re-emphasized to him the importance of walking as much as he can tolerate to improve his bloodflow.  At this time since he is without rest pain or tissue loss, and his symptoms have not  progressed, we will follow up in 6 months with repeat ABIs Continue aspirin, plavix, statin   Vicente Serene PA-C [facility]: [facility] MD: Trula Slade

## 2022-07-28 ENCOUNTER — Other Ambulatory Visit: Payer: Self-pay

## 2022-07-28 DIAGNOSIS — I739 Peripheral vascular disease, unspecified: Secondary | ICD-10-CM

## 2022-07-29 NOTE — Progress Notes (Signed)
CARDIOLOGY CONSULT NOTE       Patient ID: Jacob Avery MRN: [id number] DOB/AGE: 1945-12-09 76 y.o.  Referring Physician: [name] Primary Physician: [name], MD Primary Cardiologist: Previously [name] Reason for Consultation: PVD/Bradycardia   Active Problems:   * No active hospital problems. *   HPI:  76 y.o. previously seen by Dr [name] and referred by DR [facility] for ongoing evaluation of patients PVD, CAD, HTN, and HLD.and DM  He has not had a heart cath Presumed CAD based on abnormal myovue done June 2019 showing inferoseptal MI with mild peri infarct ischemia. CRF;s include obesity, HTN, HLD non smoker. He is followed by [facility] having had a left femoral endarterectomy and left axillobifemoral bypass by Dr [name] in July 2019 He has chronic exertional dyspnea and fatigue likely from his obesity Echo in September 2019 showed EF 60-65% with no significant valve disease    Not clear how he got back on lopressor  Looks like PA from [facility] started it back chronic bradycardia. Monitor 01/31/20 showed no high grade AV block with average HR of 48 bpm In office today ECG with HR 37 no AV block  He has had more calf claudication  Duplex with occluded left axfem graft No rest pain or tissue loss  ABI:s 07/26/22 bad Right 0.44 and left 0.47  Discussed foot care Sees podiatrist every 3 months No pre syncope or dizziness  ROS All other systems reviewed and negative except as noted above  Past Medical History:  Diagnosis Date   Arthritis    Chronic kidney disease    Stage 2   CVA (cerebral vascular accident) ([place]) 10/2014    Late effect of cerebrovascular disease.  Mostly resolved, but can see difference in facial expressions.  tp-A, Plavix afterward   Diabetes mellitus without complication ([place])    type II, controlled, with renal comps   Diabetic foot ([place]) 04/05/2018   Normal sensation, skin intact   Diverticulosis    ED (erectile dysfunction)    GI bleed  04/2017   Gout    History of claustrophobia    with MRI   Hx of adenomatous polyp of rectum 06/10/2017   Hypercholesterolemia    Hypertension    Long term (current) use of insulin ([place])    Remains on basal/bolus therapy without hypoglycemia.   Microalbuminuria 2013   Morbid obesity ([place])    Obesity    OSA (obstructive sleep apnea)    Intolerant to CPAP   Peripheral vascular disease ([place])    Right calf pain 04/05/2018   Vertigo     Family History  Problem Relation Age of Onset   CVA Mother    Alzheimer's disease Mother    Heart attack Father    Heart disease Father    Diabetes Brother    Rectal cancer Brother    Stomach cancer Neg Hx    Colon cancer Neg Hx     Social History   Socioeconomic History   Marital status: Married    Spouse name: [name]   Number of children: 2   Years of education: hs gr   Highest education level: Not on file  Occupational History   Occupation: retired    Comment: IT consultant  Tobacco Use   Smoking status: Former    Packs/day: 1.00    Years: 15.00    Total pack years: 15.00    Types: Cigarettes    Quit date: 06/06/1996    Years since quitting: 26.1   Smokeless tobacco:  Never   Tobacco comments:    Quit 20 years ago  Vaping Use   Vaping Use: Never used  Substance and Sexual Activity   Alcohol use: No    Alcohol/week: 0.0 standard drinks of alcohol   Drug use: No   Sexual activity: Not Currently    Partners: Female    Birth control/protection: None  Other Topics Concern   Not on file  Social History Narrative   Patient is married with 2 children.   Patient is right handed.   Patient has hs education.   Patient drinks tea on sundays, sodas occ.   Social Determinants of Health   Financial Resource Strain: Not on file  Food Insecurity: Not on file  Transportation Needs: Not on file  Physical Activity: Not on file  Stress: Not on file  Social Connections: Not on file  Intimate Partner Violence: Not on file    Past Surgical  History:  Procedure Laterality Date   ABDOMINAL AORTOGRAM N/A 04/13/2018   Procedure: ABDOMINAL AORTOGRAM;  Surgeon: Conrad Cambria, MD;  Location: [facility];  Service: Cardiovascular;  Laterality: N/A;   APPLICATION OF WOUND VAC Bilateral 05/30/2018   Procedure: APPLICATION OF WOUND VAC;  Surgeon: Conrad Marshfield, MD;  Location: [facility];  Service: Vascular;  Laterality: Bilateral;   AXILLARY-FEMORAL BYPASS GRAFT Left 05/30/2018   Procedure: BYPASS GRAFT LEFT AXILLA-BIFEMORAL WITH HEMO SHIELD GOLD VASCULAR GRAFTS;  Surgeon: Conrad Vineyard, MD;  Location: [facility];  Service: Vascular;  Laterality: Left;   CATARACT EXTRACTION W/PHACO Right 07/03/2021   Procedure: CATARACT EXTRACTION PHACO AND INTRAOCULAR LENS PLACEMENT ([place]);  Surgeon: Baruch Goldmann, MD;  Location: [facility];  Service: Ophthalmology;  Laterality: Right;  CDE 6.99   CATARACT EXTRACTION W/PHACO Left 07/17/2021   Procedure: CATARACT EXTRACTION PHACO AND INTRAOCULAR LENS PLACEMENT ([facility]);  Surgeon: Baruch Goldmann, MD;  Location: [facility];  Service: Ophthalmology;  Laterality: Left;  CDE 6.92   COLONOSCOPY WITH PROPOFOL N/A 06/08/2017   Procedure: COLONOSCOPY WITH PROPOFOL ( [place]);  Surgeon: Gatha Mayer, MD;  Location: [facility];  Service: Endoscopy;  Laterality: N/A;   ENDARTERECTOMY FEMORAL Bilateral 05/30/2018   Procedure: ENDARTERECTOMY FEMORAL LEFT;  Surgeon: Conrad Kenedy, MD;  Location: [facility];  Service: Vascular;  Laterality: Bilateral;   INTRAOPERATIVE ARTERIOGRAM Bilateral 05/30/2018   Procedure: INTRA OPERATIVE ARTERIOGRAM BILATERAL LOWER EXTREMITIES;  Surgeon: Conrad Grinnell, MD;  Location: [facility];  Service: Vascular;  Laterality: Bilateral;   LOWER EXTREMITY ANGIOGRAPHY N/A 04/13/2018   Procedure: LOWER EXTREMITY ANGIOGRAPHY;  Surgeon: Conrad San Antonito, MD;  Location: [facility];  Service: Cardiovascular;  Laterality: N/A;  bilateral    UMBILICAL HERNIA REPAIR        Current Outpatient Medications:    Accu-Chek  FastClix Lancets MISC, , Disp: , Rfl:    ACCU-CHEK GUIDE test strip, USE STRIP TO CHECK GLUCOSE THREE TIMES DAILY, Disp: , Rfl:    acetaminophen (TYLENOL) 650 MG CR tablet, Take 1,300 mg by mouth every 8 (eight) hours as needed for pain., Disp: , Rfl:    amLODipine (NORVASC) 10 MG tablet, Take 10 mg by mouth daily., Disp: , Rfl:    aspirin EC 81 MG tablet, Take 81 mg by mouth daily., Disp: , Rfl:    atorvastatin (LIPITOR) 80 MG tablet, Take 80 mg by mouth daily., Disp: , Rfl:    clopidogrel (PLAVIX) 75 MG tablet, Take 75 mg by mouth daily., Disp: , Rfl:    colchicine  0.6 MG tablet, Take 0.6 mg by mouth daily as needed (gout)., Disp: , Rfl:    ezetimibe (ZETIA) 10 MG tablet, Take 10 mg by mouth daily., Disp: , Rfl:    HUMALOG KWIKPEN 100 UNIT/ML KwikPen, Inject 8 Units into the skin 2 (two) times daily before a meal., Disp: , Rfl:    Insulin Pen Needle (B-D ULTRAFINE III SHORT PEN) 31G X 8 MM MISC, use pen needs 4 times a day with insulin  (DX: ICD10: E11.51), Disp: , Rfl:    Insulin Syringe-Needle U-100 31G X 5/16" 1 ML MISC, use one needle once daily, Disp: , Rfl:    LANTUS SOLOSTAR 100 UNIT/ML Solostar Pen, Inject 28 Units into the skin 2 (two) times daily., Disp: , Rfl:    losartan-hydrochlorothiazide (HYZAAR) 100-25 MG per tablet, Take 1 tablet by mouth daily., Disp: , Rfl:    metoprolol tartrate (LOPRESSOR) 50 MG tablet, Take 25 mg by mouth 2 (two) times daily., Disp: , Rfl:    Multiple Vitamins-Minerals (EMERGEN-C IMMUNE PLUS) PACK, Take 1 Scoop by mouth daily., Disp: , Rfl:    nitroGLYCERIN (NITROSTAT) 0.4 MG SL tablet, Place 1 tablet (0.4 mg total) under the tongue every 5 (five) minutes as needed for chest pain., Disp: 25 tablet, Rfl: 3   omeprazole (PRILOSEC) 20 MG capsule, Take 20 mg by mouth daily., Disp: , Rfl:    Oxymetazoline HCl (VICKS SINEX 12 HOUR NA), Place 1 spray into both nostrils daily as needed (congestion)., Disp: , Rfl:    tamsulosin (FLOMAX) 0.4 MG CAPS capsule, Take 0.4  mg by mouth at bedtime., Disp: , Rfl:    Acetaminophen (TYLENOL ARTHRITIS PAIN PO), Tylenol Arthritis Pain, Disp: , Rfl:    atorvastatin (LIPITOR) 80 MG tablet, Take 1 tablet by mouth daily. (Patient not taking: Reported on 08/09/2022), Disp: , Rfl:    omeprazole (PRILOSEC) 20 MG capsule, 1 capsule 30 minutes before morning meal (Patient not taking: Reported on 08/09/2022), Disp: , Rfl:     Physical Exam: Blood pressure (!) 162/80, pulse (!) 37, height '5\' 9"'$  (1.753 m), weight 279 lb (126.6 kg), SpO2 98 %.   Affect appropriate Obese black male  HEENT: normal Neck supple with no adenopathy JVP normal no bruits no thyromegaly Lungs clear with no wheezing and good diaphragmatic motion Heart:  S1/S2 no murmur, no rub, gallop or click PMI normal Abdomen: benighn, BS positve, no tenderness, no AAA no bruit.  No HSM or HJR Neuro non-focal Skin warm and dry Post left axillary /fem grafting with fem fem and decreased peripheral pulses    Labs:   Lab Results  Component Value Date   WBC 8.8 05/31/2018   HGB 10.3 (L) 05/31/2018   HCT 33.7 (L) 05/31/2018   MCV 79.5 05/31/2018   PLT 242 05/31/2018   No results for input(s): "NA", "K", "CL", "CO2", "BUN", "CREATININE", "CALCIUM", "PROT", "BILITOT", "ALKPHOS", "ALT", "AST", "GLUCOSE" in the last 168 hours.  Invalid input(s): "LABALBU" No results found for: "CKTOTAL", "CKMB", "CKMBINDEX", "TROPONINI"  Lab Results  Component Value Date   CHOL 138 10/26/2014   Lab Results  Component Value Date   HDL 29 (L) 10/26/2014   Lab Results  Component Value Date   LDLCALC 91 10/26/2014   Lab Results  Component Value Date   TRIG 91 10/26/2014   Lab Results  Component Value Date   CHOLHDL 4.8 10/26/2014   No results found for: "LDLDIRECT"    Radiology: VAS [place] ABI WITH/WO TBI  Result Date: 07/26/2022  LOWER EXTREMITY DOPPLER STUDY Patient Name:  ADRIEL KESSEN  Date of Exam:   07/26/2022 Medical Rec #: [id number]     Accession #:     [id number] Date of Birth: 05-02-1946     Patient Gender: M Patient Age:   48 years Exam Location:  [facility] Procedure:      VAS [place] ABI WITH/WO TBI Referring Phys: Dagoberto Ligas --------------------------------------------------------------------------------  Indications: Claudication, peripheral artery disease, and Patient complains of              recent claudication affecting both calves when walking to the              mailbox. High Risk Factors: Hypertension, hyperlipidemia, Diabetes, past history of                    smoking, prior CVA.  Vascular Interventions: 7/30/20119 - Left Axillary to femoral artery bypass                         graft and left to right fem-fem bypass graft. Comparison Study: 01/18/22 [facility] ABI and AX Fem-Fem bypass duplex. Performing Technologist: Elta Guadeloupe RVT, RDMS  Examination Guidelines: A complete evaluation includes at minimum, Doppler waveform signals and systolic blood pressure reading at the level of bilateral brachial, anterior tibial, and posterior tibial arteries, when vessel segments are accessible. Bilateral testing is considered an integral part of a complete examination. Photoelectric Plethysmograph (PPG) waveforms and toe systolic pressure readings are included as required and additional duplex testing as needed. Limited examinations for reoccurring indications may be performed as noted.  ABI Findings: +---------+------------------+-----+----------+--------+ Right    Rt Pressure (mmHg)IndexWaveform  Comment  +---------+------------------+-----+----------+--------+ Brachial 180                                       +---------+------------------+-----+----------+--------+ PTA      80                0.44 monophasic         +---------+------------------+-----+----------+--------+ DP       79                0.43 monophasic         +---------+------------------+-----+----------+--------+ Great Toe56                0.31  Abnormal           +---------+------------------+-----+----------+--------+ +---------+------------------+-----+----------+-------+ Left     Lt Pressure (mmHg)IndexWaveform  Comment +---------+------------------+-----+----------+-------+ Brachial 182                                      +---------+------------------+-----+----------+-------+ PTA      86                0.47 monophasic        +---------+------------------+-----+----------+-------+ DP       80                0.44 monophasic        +---------+------------------+-----+----------+-------+ Great Toe75                0.41 Normal            +---------+------------------+-----+----------+-------+ +-------+-----------+-----------+------------+------------+ ABI/TBIToday's ABIToday's TBIPrevious ABIPrevious TBI +-------+-----------+-----------+------------+------------+ Right  0.44       0.31  0.30        absent       +-------+-----------+-----------+------------+------------+ Left   0.47       0.41       0.46        0.27         +-------+-----------+-----------+------------+------------+  Arterial wall calcification precludes accurate ankle pressures and ABIs. Bilateral ABIs appear essentially unchanged. Bilateral TBIs appear increased.  Summary: Right: Resting right ankle-brachial index indicates severe right lower extremity arterial disease. The right toe-brachial index is abnormal. Left: Resting left ankle-brachial index indicates severe left lower extremity arterial disease. The left toe-brachial index is abnormal. *See table(s) above for measurements and observations.  Electronically signed by Harold Barban MD on 07/26/2022 at 4:26:04 PM.    Final     EKG: SB rate 44 no AV block 01/10/20 08/09/2022 SB rate 40 otherwise normal    ASSESSMENT AND PLAN:   1. CAD:  Presumed based on abnormal myovue in 2019 no chest pain observe   2. PVD: post Ax-Fem occluded with significant claudication and ABI's in  0.4 range F/U [facility] I suspect he will need some sort of intervention in future Currently no rest pain or tissue loss   3. HTN:  start hydralazine 25 mg tid   4. HLD:  Continue statin labs with primary   5. DM:  Discussed low carb diet.  Target hemoglobin A1c is 6.5 or less.  Continue current medications.  6. Bradycardia:  D/C lopressor if still low may need repeat monitor   F/U in 3 months make sure HR better off lopressor and BP better on hydralazine  Signed: Jenkins Rouge 08/09/2022, 11:00 AM

## 2022-08-02 ENCOUNTER — Encounter: Payer: Self-pay | Admitting: Podiatry

## 2022-08-02 ENCOUNTER — Ambulatory Visit (INDEPENDENT_AMBULATORY_CARE_PROVIDER_SITE_OTHER): Payer: Medicare Other | Admitting: Podiatry

## 2022-08-02 DIAGNOSIS — M79675 Pain in left toe(s): Secondary | ICD-10-CM

## 2022-08-02 DIAGNOSIS — M79674 Pain in right toe(s): Secondary | ICD-10-CM | POA: Diagnosis not present

## 2022-08-02 DIAGNOSIS — E1151 Type 2 diabetes mellitus with diabetic peripheral angiopathy without gangrene: Secondary | ICD-10-CM | POA: Diagnosis not present

## 2022-08-02 DIAGNOSIS — B351 Tinea unguium: Secondary | ICD-10-CM | POA: Diagnosis not present

## 2022-08-03 NOTE — Progress Notes (Signed)
  Subjective:  Patient ID: Jacob Avery, male    DOB: 1946-09-25,  MRN: [id number]  Jacob Avery presents to clinic today for:  Chief Complaint  Patient presents with   Nail Problem    Diabetic foot care BS-140 A1C-6.3 PCP-[name] PCP VST-2 weeks ago   He is followed closely by Vascular Team for PAD.  New problem(s): None.   PCP is [name], Fransico Him, MD , and last visit was  July 21, 2022.  No Known Allergies  Review of Systems: Negative except as noted in the HPI.  Objective: No changes noted in today's physical examination.  Jacob Avery is a pleasant 76 y.o. male morbidly obese in NAD. AAO x 3.  Vascular Examination: CFT <3 seconds b/l LE. Nonpalpable pedal pulses b/l. Pedal hair absent b/l. Skin temperature gradient WNL b/l. No pain with calf compression b/l. No edema b/l LE. No cyanosis or clubbing noted b/l LE. No ischemia or gangrene noted b/l LE.  Dermatological Examination: Pedal integument with normal turgor, texture and tone b/l LE. No open wounds b/l. No interdigital macerations b/l. Toenails 1-5 b/l elongated, thickened, discolored with subungual debris. +Tenderness with dorsal palpation of nailplates.   Resolved hyperkeratotic lesion(s) lateral nailfold b/l 5th digits consistent with Lister's corns.    Musculoskeletal Examination: Normal muscle strength 5/5 to all lower extremity muscle groups bilaterally. HAV with bunion deformity noted b/l LE. Hammertoe deformity noted 2-5 b/l.Marland Kitchen No pain, crepitus or joint limitation noted with ROM b/l LE.  Patient ambulates independently without assistive aids.  Neurological Examination: Protective sensation intact 5/5 intact bilaterally with 10g monofilament b/l. Vibratory sensation intact b/l. Proprioception intact bilaterally.  Assessment/Plan: 1. Pain due to onychomycosis of toenails of both feet   2. Type II diabetes mellitus with peripheral circulatory disorder (HCC)     No orders of the defined types were  placed in this encounter.   -Consent given for treatment as described below: -Examined patient. -No new findings. No new orders. -Patient to continue soft, supportive shoe gear daily. -Toenails 1-5 b/l were debrided in length and girth with sterile nail nippers and dremel without iatrogenic bleeding.  -Patient/POA to call should there be question/concern in the interim.   Return in about 3 months (around 11/02/2022).  Marzetta Board, DPM

## 2022-08-09 ENCOUNTER — Encounter: Payer: Self-pay | Admitting: Cardiovascular Disease

## 2022-08-09 ENCOUNTER — Ambulatory Visit: Payer: Medicare Other | Attending: Cardiovascular Disease | Admitting: Cardiovascular Disease

## 2022-08-09 VITALS — BP 162/80 | HR 37 | Ht 69.0 in | Wt 279.0 lb

## 2022-08-09 DIAGNOSIS — R001 Bradycardia, unspecified: Secondary | ICD-10-CM | POA: Diagnosis not present

## 2022-08-09 DIAGNOSIS — I739 Peripheral vascular disease, unspecified: Secondary | ICD-10-CM | POA: Diagnosis not present

## 2022-08-09 DIAGNOSIS — I10 Essential (primary) hypertension: Secondary | ICD-10-CM

## 2022-08-09 DIAGNOSIS — I2510 Atherosclerotic heart disease of native coronary artery without angina pectoris: Secondary | ICD-10-CM

## 2022-08-09 DIAGNOSIS — E782 Mixed hyperlipidemia: Secondary | ICD-10-CM

## 2022-08-09 MED ORDER — HYDRALAZINE HCL 25 MG PO TABS: 25.0000 mg | ORAL_TABLET | Freq: Three times a day (TID) | ORAL | 3 refills | Status: DC

## 2022-08-09 NOTE — Patient Instructions (Signed)
Medication Instructions:   Stop Taking Lopressor  Start Taking Hydralazine 25 mg Three Times Daily   *If you need a refill on your cardiac medications before your next appointment, please call your pharmacy*   Lab Work: NONE   If you have labs (blood work) drawn today and your tests are completely normal, you will receive your results only by: [place] (if you have MyChart) OR A paper copy in the mail If you have any lab test that is abnormal or we need to change your treatment, we will call you to review the results.   Testing/Procedures: NONE    Follow-Up: At [facility], you and your health needs are our priority.  As part of our continuing mission to provide you with exceptional heart care, we have created designated Provider Care Teams.  These Care Teams include your primary Cardiologist (physician) and Advanced Practice Providers (APPs -  Physician Assistants and Nurse Practitioners) who all work together to provide you with the care you need, when you need it.  We recommend signing up for the patient portal called "MyChart".  Sign up information is provided on this After Visit Summary.  MyChart is used to connect with patients for Virtual Visits (Telemedicine).  Patients are able to view lab/test results, encounter notes, upcoming appointments, etc.  Non-urgent messages can be sent to your provider as well.   To learn more about what you can do with MyChart, go to [email or web address].    Your next appointment:   3 month(s)  The format for your next appointment:   In Person  Provider:   You may see Jenkins Rouge, MD or one of the following Advanced Practice Providers on your designated Care Team:   Bernerd Pho, PA-C  Ermalinda Barrios, PA-C     Other Instructions Thank you for choosing [facility]!    Important Information About Sugar

## 2022-08-09 NOTE — Addendum Note (Signed)
Addended by: Sharee Holster R on: 08/09/2022 12:23 PM   Modules accepted: Orders

## 2022-10-21 NOTE — Progress Notes (Signed)
CARDIOLOGY CONSULT NOTE       Patient ID: Jacob Avery MRN: [id number] DOB/AGE: 1946/08/03 76 y.o.  Referring Physician: [name] Primary Physician: [name], MD Primary Cardiologist: Previously [name] Reason for Consultation: PVD/Bradycardia   Active Problems:   * No active hospital problems. *   HPI:  76 y.o. previously seen by Dr [name] and referred by DR [facility] for ongoing evaluation of patients PVD, CAD, HTN, and HLD.and DM  He has not had a heart cath Presumed CAD based on abnormal myovue done June 2019 showing inferoseptal MI with mild peri infarct ischemia. CRF;s include obesity, HTN, HLD non smoker. He is followed by [facility] having had a left femoral endarterectomy and left axillobifemoral bypass by Dr [name] in July 2019 He has chronic exertional dyspnea and fatigue likely from his obesity Echo in September 2019 showed EF 60-65% with no significant valve disease    October 2023 - Not clear how he got back on lopressor  Looks like PA from [facility] [place] started it back chronic bradycardia. Monitor 01/31/20 showed no high grade AV block with average HR of 48 bpm Office visit 08/09/22   ECG with HR 37 no AV block  Lopressor d/c and started on hydralazine   He has had more calf claudication  Duplex with occluded left axfem graft No rest pain or tissue loss  ABI:s 07/26/22 bad Right 0.44 and left 0.47 Seen by [facility] PA 07/26/22 and no intervention since patient Free of rest pain and tissue loss Ultimately will see Brabham as MD   Discussed foot care Sees podiatrist every 3 months No pre syncope or dizziness  Discussed need to lose weight especially if he may need a femorally based vascular intervention in future   Has two daughters in areas and 6 grand kids They all get together for holidays Wife's health is pretty good  ROS All other systems reviewed and negative except as noted above  Past Medical History:  Diagnosis Date   Arthritis    Chronic kidney disease     Stage 2   CVA (cerebral vascular accident) ([place]) 10/2014    Late effect of cerebrovascular disease.  Mostly resolved, but can see difference in facial expressions.  tp-A, Plavix afterward   Diabetes mellitus without complication ([place])    type II, controlled, with renal comps   Diabetic foot ([place]) 04/05/2018   Normal sensation, skin intact   Diverticulosis    ED (erectile dysfunction)    GI bleed 04/2017   Gout    History of claustrophobia    with MRI   Hx of adenomatous polyp of rectum 06/10/2017   Hypercholesterolemia    Hypertension    Long term (current) use of insulin ([place])    Remains on basal/bolus therapy without hypoglycemia.   Microalbuminuria 2013   Morbid obesity ([place])    Obesity    OSA (obstructive sleep apnea)    Intolerant to CPAP   Peripheral vascular disease ([place])    Right calf pain 04/05/2018   Vertigo     Family History  Problem Relation Age of Onset   CVA Mother    Alzheimer's disease Mother    Heart attack Father    Heart disease Father    Diabetes Brother    Rectal cancer Brother    Stomach cancer Neg Hx    Colon cancer Neg Hx     Social History   Socioeconomic History   Marital status: Married    Spouse name: [place]   Number  of children: 2   Years of education: hs gr   Highest education level: Not on file  Occupational History   Occupation: retired    Comment: Adjuster  Tobacco Use   Smoking status: Former    Packs/day: 1.00    Years: 15.00    Total pack years: 15.00    Types: Cigarettes    Quit date: 06/06/1996    Years since quitting: 26.4   Smokeless tobacco: Never   Tobacco comments:    Quit 20 years ago  Vaping Use   Vaping Use: Never used  Substance and Sexual Activity   Alcohol use: No    Alcohol/week: 0.0 standard drinks of alcohol   Drug use: No   Sexual activity: Not Currently    Partners: Female    Birth control/protection: None  Other Topics Concern   Not on file  Social History Narrative   Patient is married with 2  children.   Patient is right handed.   Patient has hs education.   Patient drinks tea on sundays, sodas occ.   Social Determinants of Health   Financial Resource Strain: Not on file  Food Insecurity: Not on file  Transportation Needs: Not on file  Physical Activity: Not on file  Stress: Not on file  Social Connections: Not on file  Intimate Partner Violence: Not on file    Past Surgical History:  Procedure Laterality Date   ABDOMINAL AORTOGRAM N/A 04/13/2018   Procedure: ABDOMINAL AORTOGRAM;  Surgeon: Conrad [place], MD;  Location: [facility];  Service: Cardiovascular;  Laterality: N/A;   APPLICATION OF WOUND VAC Bilateral 05/30/2018   Procedure: APPLICATION OF WOUND VAC;  Surgeon: Conrad [place], MD;  Location: [facility];  Service: Vascular;  Laterality: Bilateral;   AXILLARY-FEMORAL BYPASS GRAFT Left 05/30/2018   Procedure: BYPASS GRAFT LEFT AXILLA-BIFEMORAL WITH HEMO SHIELD GOLD VASCULAR GRAFTS;  Surgeon: Conrad [place], MD;  Location: [facility];  Service: Vascular;  Laterality: Left;   CATARACT EXTRACTION W/PHACO Right 07/03/2021   Procedure: CATARACT EXTRACTION PHACO AND INTRAOCULAR LENS PLACEMENT ([place]);  Surgeon: Baruch Goldmann, MD;  Location: [facility];  Service: Ophthalmology;  Laterality: Right;  CDE 6.99   CATARACT EXTRACTION W/PHACO Left 07/17/2021   Procedure: CATARACT EXTRACTION PHACO AND INTRAOCULAR LENS PLACEMENT ([facility]);  Surgeon: Baruch Goldmann, MD;  Location: [facility];  Service: Ophthalmology;  Laterality: Left;  CDE 6.92   COLONOSCOPY WITH PROPOFOL N/A 06/08/2017   Procedure: COLONOSCOPY WITH PROPOFOL ( [place]);  Surgeon: Gatha Mayer, MD;  Location: [facility];  Service: Endoscopy;  Laterality: N/A;   ENDARTERECTOMY FEMORAL Bilateral 05/30/2018   Procedure: ENDARTERECTOMY FEMORAL LEFT;  Surgeon: Conrad [place], MD;  Location: [facility];  Service: Vascular;  Laterality: Bilateral;   INTRAOPERATIVE ARTERIOGRAM Bilateral 05/30/2018   Procedure: INTRA OPERATIVE ARTERIOGRAM  BILATERAL LOWER EXTREMITIES;  Surgeon: Conrad [place], MD;  Location: [facility];  Service: Vascular;  Laterality: Bilateral;   LOWER EXTREMITY ANGIOGRAPHY N/A 04/13/2018   Procedure: LOWER EXTREMITY ANGIOGRAPHY;  Surgeon: Conrad , MD;  Location: [facility];  Service: Cardiovascular;  Laterality: N/A;  bilateral    UMBILICAL HERNIA REPAIR        Current Outpatient Medications:    Accu-Chek FastClix Lancets MISC, , Disp: , Rfl:    ACCU-CHEK GUIDE test strip, USE STRIP TO CHECK GLUCOSE THREE TIMES DAILY, Disp: , Rfl:    Acetaminophen (TYLENOL ARTHRITIS PAIN PO), Tylenol Arthritis Pain, Disp: , Rfl:    acetaminophen (TYLENOL) 650  MG CR tablet, Take 1,300 mg by mouth every 8 (eight) hours as needed for pain., Disp: , Rfl:    amLODipine (NORVASC) 10 MG tablet, Take 10 mg by mouth daily., Disp: , Rfl:    aspirin EC 81 MG tablet, Take 81 mg by mouth daily., Disp: , Rfl:    atorvastatin (LIPITOR) 80 MG tablet, Take 80 mg by mouth daily., Disp: , Rfl:    atorvastatin (LIPITOR) 80 MG tablet, Take 1 tablet by mouth daily., Disp: , Rfl:    clopidogrel (PLAVIX) 75 MG tablet, Take 75 mg by mouth daily., Disp: , Rfl:    colchicine 0.6 MG tablet, Take 0.6 mg by mouth daily as needed (gout)., Disp: , Rfl:    ezetimibe (ZETIA) 10 MG tablet, Take 10 mg by mouth daily., Disp: , Rfl:    HUMALOG KWIKPEN 100 UNIT/ML KwikPen, Inject 8 Units into the skin 2 (two) times daily before a meal., Disp: , Rfl:    hydrALAZINE (APRESOLINE) 25 MG tablet, Take 1 tablet (25 mg total) by mouth 3 (three) times daily., Disp: 270 tablet, Rfl: 3   Insulin Pen Needle (B-D ULTRAFINE III SHORT PEN) 31G X 8 MM MISC, use pen needs 4 times a day with insulin  (DX: ICD10: E11.51), Disp: , Rfl:    Insulin Syringe-Needle U-100 31G X 5/16" 1 ML MISC, use one needle once daily, Disp: , Rfl:    LANTUS SOLOSTAR 100 UNIT/ML Solostar Pen, Inject 28 Units into the skin 2 (two) times daily., Disp: , Rfl:    losartan-hydrochlorothiazide  (HYZAAR) 100-25 MG per tablet, Take 1 tablet by mouth daily., Disp: , Rfl:    Multiple Vitamins-Minerals (EMERGEN-C IMMUNE PLUS) PACK, Take 1 Scoop by mouth daily., Disp: , Rfl:    nitroGLYCERIN (NITROSTAT) 0.4 MG SL tablet, Place 1 tablet (0.4 mg total) under the tongue every 5 (five) minutes as needed for chest pain., Disp: 25 tablet, Rfl: 3   omeprazole (PRILOSEC) 20 MG capsule, Take 20 mg by mouth daily., Disp: , Rfl:    omeprazole (PRILOSEC) 20 MG capsule, , Disp: , Rfl:    Oxymetazoline HCl (VICKS SINEX 12 HOUR NA), Place 1 spray into both nostrils daily as needed (congestion)., Disp: , Rfl:    tamsulosin (FLOMAX) 0.4 MG CAPS capsule, Take 0.4 mg by mouth at bedtime., Disp: , Rfl:     Physical Exam: Blood pressure (!) 146/73, pulse (!) 52, height '5\' 9"'$  (1.753 m), weight 176 lb 6.4 oz (80 kg), SpO2 94 %.   Affect appropriate Obese black male  HEENT: normal Neck supple with no adenopathy JVP normal no bruits no thyromegaly Lungs clear with no wheezing and good diaphragmatic motion Heart:  S1/S2 no murmur, no rub, gallop or click PMI normal Abdomen: benighn, BS positve, no tenderness, no AAA no bruit.  No HSM or HJR Neuro non-focal Skin warm and dry Post left axillary /fem grafting with fem fem and decreased peripheral pulses    Labs:   Lab Results  Component Value Date   WBC 8.8 05/31/2018   HGB 10.3 (L) 05/31/2018   HCT 33.7 (L) 05/31/2018   MCV 79.5 05/31/2018   PLT 242 05/31/2018   No results for input(s): "NA", "K", "CL", "CO2", "BUN", "CREATININE", "CALCIUM", "PROT", "BILITOT", "ALKPHOS", "ALT", "AST", "GLUCOSE" in the last 168 hours.  Invalid input(s): "LABALBU" No results found for: "CKTOTAL", "CKMB", "CKMBINDEX", "TROPONINI"  Lab Results  Component Value Date   CHOL 138 10/26/2014   Lab Results  Component Value Date  HDL 29 (L) 10/26/2014   Lab Results  Component Value Date   LDLCALC 91 10/26/2014   Lab Results  Component Value Date   TRIG 91  10/26/2014   Lab Results  Component Value Date   CHOLHDL 4.8 10/26/2014   No results found for: "LDLDIRECT"    Radiology: No results found.  EKG: SB rate 44 no AV block 01/10/20 10/29/2022 SB rate 40 otherwise normal    ASSESSMENT AND PLAN:   1. CAD:  Presumed based on abnormal myovue in 2019 no chest pain observe   2. PVD: post Ax-Fem occluded with significant claudication and ABI's in 0.4 range F/U [facility] I suspect he will need some sort of intervention in future Currently no rest pain or tissue loss   3. HTN:   bradycardia on lopressor started on hydralazine 08/09/22 improved   4. HLD:  Continue statin labs with primary   5. DM:  Discussed low carb diet.  Target hemoglobin A1c is 6.5 or less.  Continue current medications.  6. Bradycardia:  better off lopressor   F/U in 6 months    Signed: Jenkins Rouge 10/29/2022, 10:13 AM

## 2022-10-29 ENCOUNTER — Encounter: Payer: Self-pay | Admitting: Cardiovascular Disease

## 2022-10-29 ENCOUNTER — Ambulatory Visit: Payer: Medicare Other | Attending: Cardiovascular Disease | Admitting: Cardiovascular Disease

## 2022-10-29 VITALS — BP 146/73 | HR 52 | Ht 69.0 in | Wt 176.4 lb

## 2022-10-29 DIAGNOSIS — R001 Bradycardia, unspecified: Secondary | ICD-10-CM | POA: Diagnosis not present

## 2022-10-29 DIAGNOSIS — I10 Essential (primary) hypertension: Secondary | ICD-10-CM

## 2022-10-29 DIAGNOSIS — I739 Peripheral vascular disease, unspecified: Secondary | ICD-10-CM

## 2022-10-29 DIAGNOSIS — I2510 Atherosclerotic heart disease of native coronary artery without angina pectoris: Secondary | ICD-10-CM

## 2022-10-29 DIAGNOSIS — E782 Mixed hyperlipidemia: Secondary | ICD-10-CM

## 2022-10-29 NOTE — Patient Instructions (Signed)
Medication Instructions:  Your physician recommends that you continue on your current medications as directed. Please refer to the Current Medication list given to you today.   Labwork: None  Testing/Procedures: None  Follow-Up: Follow up with Dr. Nishan in 6 months.   Any Other Special Instructions Will Be Listed Below (If Applicable).     If you need a refill on your cardiac medications before your next appointment, please call your pharmacy.  

## 2022-11-09 ENCOUNTER — Ambulatory Visit: Payer: Medicare Other | Admitting: Podiatry

## 2022-11-09 VITALS — BP 148/67

## 2022-11-09 DIAGNOSIS — E1151 Type 2 diabetes mellitus with diabetic peripheral angiopathy without gangrene: Secondary | ICD-10-CM

## 2022-11-09 DIAGNOSIS — M79674 Pain in right toe(s): Secondary | ICD-10-CM

## 2022-11-09 DIAGNOSIS — E119 Type 2 diabetes mellitus without complications: Secondary | ICD-10-CM

## 2022-11-09 DIAGNOSIS — M2011 Hallux valgus (acquired), right foot: Secondary | ICD-10-CM | POA: Diagnosis not present

## 2022-11-09 DIAGNOSIS — M2012 Hallux valgus (acquired), left foot: Secondary | ICD-10-CM

## 2022-11-09 DIAGNOSIS — I739 Peripheral vascular disease, unspecified: Secondary | ICD-10-CM | POA: Diagnosis not present

## 2022-11-09 DIAGNOSIS — B351 Tinea unguium: Secondary | ICD-10-CM

## 2022-11-09 DIAGNOSIS — M79675 Pain in left toe(s): Secondary | ICD-10-CM | POA: Diagnosis not present

## 2022-11-09 NOTE — Progress Notes (Signed)
ANNUAL DIABETIC FOOT EXAM  Subjective: Jacob Avery presents today for annual diabetic foot examination.  He has known PAD with claudication. Followed by Dr. Jenkins Rouge in Cardiology and there may be need for LE intervention in the future  Chief Complaint  Patient presents with   Nail Problem    [facility] BS-116 A1C-6.? PCP-Jacob Avery PCP VST- 3 months ago   Patient confirms h/o diabetes. He has PAD and is followed by Vascular.  Patient denies any h/o foot wounds.  Patient denies any numbness, tingling, burning, or pins/needle sensation in feet.  Risk factors: diabetes, history of gout, PAD, h/o CVA, HTN, CKD, hyperlipidemia, diabetic renal disease, h/o tobacco use in remission.  Jacob Avery, Jacob Him, MD is patient's PCP.   Past Medical History:  Diagnosis Date   Arthritis    Chronic kidney disease    Stage 2   CVA (cerebral vascular accident) ([place]) 10/2014    Late effect of cerebrovascular disease.  Mostly resolved, but can see difference in facial expressions.  tp-A, Plavix afterward   Diabetes mellitus without complication ([place])    type II, controlled, with renal comps   Diabetic foot ([place]) 04/05/2018   Normal sensation, skin intact   Diverticulosis    ED (erectile dysfunction)    GI bleed 04/2017   Gout    History of claustrophobia    with MRI   Hx of adenomatous polyp of rectum 06/10/2017   Hypercholesterolemia    Hypertension    Long term (current) use of insulin ([place])    Remains on basal/bolus therapy without hypoglycemia.   Microalbuminuria 2013   Morbid obesity ([place])    Obesity    OSA (obstructive sleep apnea)    Intolerant to CPAP   Peripheral vascular disease ([place])    Right calf pain 04/05/2018   Vertigo    Patient Active Problem List   Diagnosis Date Noted   Annual physical exam 01/13/2021   Benign prostatic hyperplasia with lower urinary tract symptoms 12/19/2018   Hypertensive heart and renal disease 12/19/2018   PAD (peripheral artery disease) ([place])  05/30/2018   Critical lower limb ischemia ([place]) 04/10/2018   Chronic kidney disease, stage 2 (mild) 12/14/2017   Hx of adenomatous polyp of rectum 06/10/2017   Long term (current) use of insulin ([place]) 08/19/2015   Sequelae of cerebrovascular disease 08/19/2015   HLD (hyperlipidemia) 01/07/2015   Obstructive sleep apnea 01/07/2015   Sleep apnea 12/11/2014   Snoring 12/11/2014   Severe obesity (BMI >= 40) ([place]) 94/17/4081   Embolic stroke involving right carotid artery ([place]) 12/11/2014   Palpitations 10/29/2014   Carotid stenosis    Essential hypertension 10/28/2014   Hyperlipidemia LDL goal <70 10/28/2014   Morbid obesity ([place]) 10/28/2014   Diabetes ([place]) 10/28/2014   likely OSA (obstructive sleep apnea) 10/28/2014   Cerebral infarction due to occlusion or stenosis of precerebral artery ([place]) 10/25/2014   Proteinuria 04/03/2012   Gout 06/14/2011   Hyperglycemia due to type 2 diabetes mellitus ([place]) 06/03/2010   OTHER SPECIFIED INTESTINAL OBSTRUCTION 12/22/2009   DIVERTICULOSIS-COLON 12/22/2009   Diabetic renal disease ([place]) 05/25/2009   Male erectile disorder 05/25/2009   Pure hypercholesterolemia 05/25/2009   Past Surgical History:  Procedure Laterality Date   ABDOMINAL AORTOGRAM N/A 04/13/2018   Procedure: ABDOMINAL AORTOGRAM;  Surgeon: Conrad Yauco, MD;  Location: [facility];  Service: Cardiovascular;  Laterality: N/A;   APPLICATION OF WOUND VAC Bilateral 05/30/2018   Procedure: APPLICATION OF WOUND VAC;  Surgeon: Conrad Kirby, MD;  Location: [facility];  Service: Vascular;  Laterality: Bilateral;   AXILLARY-FEMORAL BYPASS GRAFT Left 05/30/2018   Procedure: BYPASS GRAFT LEFT AXILLA-BIFEMORAL WITH HEMO SHIELD GOLD VASCULAR GRAFTS;  Surgeon: Conrad [place], MD;  Location: [facility];  Service: Vascular;  Laterality: Left;   CATARACT EXTRACTION W/PHACO Right 07/03/2021   Procedure: CATARACT EXTRACTION PHACO AND INTRAOCULAR LENS PLACEMENT ([place]);  Surgeon: Baruch Goldmann, MD;  Location:  [facility];  Service: Ophthalmology;  Laterality: Right;  CDE 6.99   CATARACT EXTRACTION W/PHACO Left 07/17/2021   Procedure: CATARACT EXTRACTION PHACO AND INTRAOCULAR LENS PLACEMENT ([facility]);  Surgeon: Baruch Goldmann, MD;  Location: [facility];  Service: Ophthalmology;  Laterality: Left;  CDE 6.92   COLONOSCOPY WITH PROPOFOL N/A 06/08/2017   Procedure: COLONOSCOPY WITH PROPOFOL ( [place]);  Surgeon: Gatha Mayer, MD;  Location: [facility];  Service: Endoscopy;  Laterality: N/A;   ENDARTERECTOMY FEMORAL Bilateral 05/30/2018   Procedure: ENDARTERECTOMY FEMORAL LEFT;  Surgeon: Conrad Balta, MD;  Location: [place];  Service: Vascular;  Laterality: Bilateral;   INTRAOPERATIVE ARTERIOGRAM Bilateral 05/30/2018   Procedure: INTRA OPERATIVE ARTERIOGRAM BILATERAL LOWER EXTREMITIES;  Surgeon: Conrad Delano, MD;  Location: [facility];  Service: Vascular;  Laterality: Bilateral;   LOWER EXTREMITY ANGIOGRAPHY N/A 04/13/2018   Procedure: LOWER EXTREMITY ANGIOGRAPHY;  Surgeon: Conrad Hannibal, MD;  Location: [place] [facility];  Service: Cardiovascular;  Laterality: N/A;  bilateral    UMBILICAL HERNIA REPAIR     Current Outpatient Medications on File Prior to Visit  Medication Sig Dispense Refill   Accu-Chek FastClix Lancets MISC      ACCU-CHEK GUIDE test strip USE STRIP TO CHECK GLUCOSE THREE TIMES DAILY     Acetaminophen (TYLENOL ARTHRITIS PAIN PO) Tylenol Arthritis Pain     acetaminophen (TYLENOL) 650 MG CR tablet Take 1,300 mg by mouth every 8 (eight) hours as needed for pain.     amLODipine (NORVASC) 10 MG tablet Take 10 mg by mouth daily.     aspirin EC 81 MG tablet Take 81 mg by mouth daily.     atorvastatin (LIPITOR) 80 MG tablet Take 80 mg by mouth daily.     atorvastatin (LIPITOR) 80 MG tablet Take 1 tablet by mouth daily.     clopidogrel (PLAVIX) 75 MG tablet Take 75 mg by mouth daily.     colchicine 0.6 MG tablet Take 0.6 mg by mouth daily as needed (gout).     ezetimibe (ZETIA) 10 MG tablet Take 10 mg  by mouth daily.     HUMALOG KWIKPEN 100 UNIT/ML KwikPen Inject 8 Units into the skin 2 (two) times daily before a meal.     hydrALAZINE (APRESOLINE) 25 MG tablet Take 1 tablet (25 mg total) by mouth 3 (three) times daily. 270 tablet 3   Insulin Pen Needle (B-D ULTRAFINE III SHORT PEN) 31G X 8 MM MISC use pen needs 4 times a day with insulin  (DX: ICD10: E11.51)     Insulin Syringe-Needle U-100 31G X 5/16" 1 ML MISC use one needle once daily     LANTUS SOLOSTAR 100 UNIT/ML Solostar Pen Inject 28 Units into the skin 2 (two) times daily.     losartan-hydrochlorothiazide (HYZAAR) 100-25 MG per tablet Take 1 tablet by mouth daily.     Multiple Vitamins-Minerals (EMERGEN-C IMMUNE PLUS) PACK Take 1 Scoop by mouth daily.     nitroGLYCERIN (NITROSTAT) 0.4 MG SL tablet Place 1 tablet (0.4 mg total) under the tongue every 5 (five) minutes as needed  for chest pain. 25 tablet 3   omeprazole (PRILOSEC) 20 MG capsule Take 20 mg by mouth daily.     omeprazole (PRILOSEC) 20 MG capsule      Oxymetazoline HCl (VICKS SINEX 12 HOUR NA) Place 1 spray into both nostrils daily as needed (congestion).     tamsulosin (FLOMAX) 0.4 MG CAPS capsule Take 0.4 mg by mouth at bedtime.     No current facility-administered medications on file prior to visit.    No Known Allergies Social History   Occupational History   Occupation: retired    Comment: IT consultant  Tobacco Use   Smoking status: Former    Packs/day: 1.00    Years: 15.00    Total pack years: 15.00    Types: Cigarettes    Quit date: 06/06/1996    Years since quitting: 26.4   Smokeless tobacco: Never   Tobacco comments:    Quit 20 years ago  Vaping Use   Vaping Use: Never used  Substance and Sexual Activity   Alcohol use: No    Alcohol/week: 0.0 standard drinks of alcohol   Drug use: No   Sexual activity: Not Currently    Partners: Female    Birth control/protection: None   Family History  Problem Relation Age of Onset   CVA Mother    Alzheimer's  disease Mother    Heart attack Father    Heart disease Father    Diabetes Brother    Rectal cancer Brother    Stomach cancer Neg Hx    Colon cancer Neg Hx    Immunization History  Administered Date(s) Administered   Tdap 04/11/2019    Review of Systems: Negative except as noted in the HPI.   Objective: Vitals:   11/09/22 1010  BP: (!) 148/67   Korry Dalgleish is a pleasant 77 y.o. male in NAD. AAO X 3.  Vascular Examination: CFT <3 seconds b/l LE. Diminished pedal pulses b/l LE. Pedal hair absent. No pain with calf compression b/l. Lower extremity skin temperature gradient within normal limits. Trace edema noted BLE. No ischemia or gangrene noted b/l LE. No cyanosis or clubbing noted b/l LE.  Dermatological Examination: Pedal skin thin and atrophic b/l LE. No open wounds b/l LE. No interdigital macerations noted b/l LE. Toenails 1-5 b/l elongated, discolored, dystrophic, thickened, crumbly with subungual debris and tenderness to dorsal palpation. No hyperkeratotic nor porokeratotic lesions present on today's visit.  Neurological Examination: Protective sensation intact 5/5 intact bilaterally with 10g monofilament b/l. Vibratory sensation intact b/l.  Musculoskeletal Examination: Muscle strength 5/5 to all lower extremity muscle groups bilaterally. HAV with bunion bilaterally and hammertoes 2-5 b/l.  Footwear Assessment: Does the patient wear appropriate shoes? Yes. Does the patient need inserts/orthotics? Yes.  ADA Risk Categorization: High Risk  Patient has one or more of the following: Loss of protective sensation Absent pedal pulses Severe Foot deformity History of foot ulcer  Assessment: 1. Pain due to onychomycosis of toenails of both feet   2. Hallux valgus, acquired, bilateral   3. Claudication in peripheral vascular disease ([place])   4. Type II diabetes mellitus with peripheral circulatory disorder (HCC)   5. Encounter for diabetic foot exam [facility])      Plan: -Patient was evaluated and treated. All patient's and/or POA's questions/concerns answered on today's visit. -Continue supportive shoe gear daily. -Mycotic toenails 1-5 bilaterally were debrided in length and girth with sterile nail nippers and dremel without incident. -Patient/POA to call should there be question/concern in the interim.  Return in about 3 months (around 02/08/2023).  Marzetta Board, DPM

## 2022-11-13 ENCOUNTER — Encounter: Payer: Self-pay | Admitting: Podiatry

## 2023-02-16 ENCOUNTER — Encounter: Payer: Self-pay | Admitting: Podiatry

## 2023-02-16 ENCOUNTER — Ambulatory Visit (INDEPENDENT_AMBULATORY_CARE_PROVIDER_SITE_OTHER): Payer: Medicare Other | Admitting: Podiatry

## 2023-02-16 DIAGNOSIS — E1151 Type 2 diabetes mellitus with diabetic peripheral angiopathy without gangrene: Secondary | ICD-10-CM | POA: Diagnosis not present

## 2023-02-16 DIAGNOSIS — M79675 Pain in left toe(s): Secondary | ICD-10-CM

## 2023-02-16 DIAGNOSIS — M79674 Pain in right toe(s): Secondary | ICD-10-CM

## 2023-02-16 DIAGNOSIS — B351 Tinea unguium: Secondary | ICD-10-CM

## 2023-02-16 NOTE — Progress Notes (Signed)
  Subjective:  Patient ID: Jacob Avery, male    DOB: [birth year],  MRN: [id number]  Jacob Avery presents to clinic today for at risk foot care. Pt has h/o NIDDM with PAD and painful thick toenails that are difficult to trim. Pain interferes with ambulation. Aggravating factors include wearing enclosed shoe gear. Pain is relieved with periodic professional debridement.  Chief Complaint  Patient presents with   Nail Problem    [facility] BS-89 A1C-6.7 PCP-[name] PCP VST- 3 months ago   New problem(s): None.   PCP is [name], Adelfa Koh, MD.  No Known Allergies  Review of Systems: Negative except as noted in the HPI.  Objective: No changes noted in today's physical examination. There were no vitals filed for this visit. Jacob Avery is a pleasant 77 y.o. male morbidly obese in NAD. AAO x 3.  Vascular Examination: CFT <3 seconds b/l LE. Diminished pedal pulses b/l LE. Pedal hair absent. No pain with calf compression b/l. Lower extremity skin temperature gradient within normal limits. Trace edema noted BLE. No ischemia or gangrene noted b/l LE. No cyanosis or clubbing noted b/l LE.  Dermatological Examination: Pedal skin thin and atrophic b/l LE. No open wounds b/l LE. No interdigital macerations noted b/l LE. Toenails 1-5 b/l elongated, discolored, dystrophic, thickened, crumbly with subungual debris and tenderness to dorsal palpation. No hyperkeratotic nor porokeratotic lesions present on today's visit.  Neurological Examination: Protective sensation intact 5/5 intact bilaterally with 10g monofilament b/l. Vibratory sensation intact b/l.  Musculoskeletal Examination: Muscle strength 5/5 to all lower extremity muscle groups bilaterally. HAV with bunion bilaterally and hammertoes 2-5 b/l.  Assessment/Plan: 1. Pain due to onychomycosis of toenails of both feet   2. Type II diabetes mellitus with peripheral circulatory disorder    -Consent given for treatment as described below: -Examined  patient. -Continue supportive shoe gear daily. -Toenails 1-5 b/l were debrided in length and girth with sterile nail nippers and dremel without iatrogenic bleeding.  -Patient/POA to call should there be question/concern in the interim.   Return in about 3 months (around 05/18/2023).  Freddie Breech, DPM

## 2023-02-28 ENCOUNTER — Ambulatory Visit (INDEPENDENT_AMBULATORY_CARE_PROVIDER_SITE_OTHER): Payer: Medicare Other | Admitting: Physician Assistant

## 2023-02-28 ENCOUNTER — Ambulatory Visit (HOSPITAL_COMMUNITY)
Admission: RE | Admit: 2023-02-28 | Discharge: 2023-02-28 | Disposition: A | Discharge status: Home / Self Care | Payer: Medicare Other | Source: Ambulatory Visit | Attending: Surgery | Admitting: Surgery

## 2023-02-28 VITALS — BP 159/65 | HR 46 | Temp 98.4°F | Resp 16 | Ht 69.0 in | Wt 270.0 lb

## 2023-02-28 DIAGNOSIS — I739 Peripheral vascular disease, unspecified: Secondary | ICD-10-CM

## 2023-02-28 LAB — VAS US ABI WITH/WO TBI
Left ABI: 0.6
Right ABI: 0.49

## 2023-02-28 NOTE — Progress Notes (Unsigned)
[facility] [place] HISTORY AND PHYSICAL   History of Present Illness:  Patient is a 77 y.o. year old male who presents for evaluation of claudication.  He has a history of left axillobifemoral bypass graft and left femoral endarterectomy on 05/30/2018 by Dr. Imogene Burn.  This was done for critical limb ischemia with rest pain bilaterally and known abdominal aortic dissection.   The patient currently describes a cramping sensation in the in the calf about half way back from his mailbox.  This is about 20 yards.  He denies rest pain and non healing wounds.  He states the other rate limiting factor is COPD and SOB with exertion.  He does not walk at the stores he uses a power scooter.  He states his claudication symptoms have not changed distance wise for a good while.    Duplex demonstrates an occluded left axillofemoral bypass graft -Case was discussed briefly with Dr. Myra Gianotti.  Currently patient does not have any rest pain or tissue loss thus we will not pursue revascularization at this time.  Based on brief review of prior CTA aorta with runoff, patient may have other surgical options other than redo ax bifemoral bypass graft.   He is medially managed on ASA, Plavix and Statin daily.  He does have co morbidities of DM insulin dependent, HTN hypochloridemia.          Past Medical History:  Diagnosis Date   Arthritis    Chronic kidney disease    Stage 2   CVA (cerebral vascular accident) (HCC) 10/2014    Late effect of cerebrovascular disease.  Mostly resolved, but can see difference in facial expressions.  tp-A, Plavix afterward   Diabetes mellitus without complication (HCC)    type II, controlled, with renal comps   Diabetic foot (HCC) 04/05/2018   Normal sensation, skin intact   Diverticulosis    ED (erectile dysfunction)    GI bleed 04/2017   Gout    History of claustrophobia    with MRI   Hx of adenomatous polyp of rectum 06/10/2017   Hypercholesterolemia     Hypertension    Long term (current) use of insulin (HCC)    Remains on basal/bolus therapy without hypoglycemia.   Microalbuminuria 2013   Morbid obesity (HCC)    Obesity    OSA (obstructive sleep apnea)    Intolerant to CPAP   Peripheral vascular disease (HCC)    Right calf pain 04/05/2018   Vertigo     Past Surgical History:  Procedure Laterality Date   ABDOMINAL AORTOGRAM N/A 04/13/2018   Procedure: ABDOMINAL AORTOGRAM;  Surgeon: Fransisco Hertz, MD;  Location: [facility] INVASIVE CV LAB;  Service: Cardiovascular;  Laterality: N/A;   APPLICATION OF WOUND VAC Bilateral 05/30/2018   Procedure: APPLICATION OF WOUND VAC;  Surgeon: Fransisco Hertz, MD;  Location: [facility] OR;  Service: Vascular;  Laterality: Bilateral;   AXILLARY-FEMORAL BYPASS GRAFT Left 05/30/2018   Procedure: BYPASS GRAFT LEFT AXILLA-BIFEMORAL WITH HEMO SHIELD GOLD VASCULAR GRAFTS;  Surgeon: Fransisco Hertz, MD;  Location: [facility] OR;  Service: Vascular;  Laterality: Left;   CATARACT EXTRACTION W/PHACO Right 07/03/2021   Procedure: CATARACT EXTRACTION PHACO AND INTRAOCULAR LENS PLACEMENT (IOC);  Surgeon: Fabio Pierce, MD;  Location: [facility];  Service: Ophthalmology;  Laterality: Right;  CDE 6.99   CATARACT EXTRACTION W/PHACO Left 07/17/2021   Procedure: CATARACT EXTRACTION PHACO AND INTRAOCULAR LENS PLACEMENT (IOC);  Surgeon: Fabio Pierce, MD;  Location: [facility];  Service: Ophthalmology;  Laterality:  Left;  CDE 6.92   COLONOSCOPY WITH PROPOFOL N/A 06/08/2017   Procedure: COLONOSCOPY WITH PROPOFOL ( Ultra-Slim Scope);  Surgeon: Iva Boop, MD;  Location: [facility];  Service: Endoscopy;  Laterality: N/A;   ENDARTERECTOMY FEMORAL Bilateral 05/30/2018   Procedure: ENDARTERECTOMY FEMORAL LEFT;  Surgeon: Fransisco Hertz, MD;  Location: [facility] OR;  Service: Vascular;  Laterality: Bilateral;   INTRAOPERATIVE ARTERIOGRAM Bilateral 05/30/2018   Procedure: INTRA OPERATIVE ARTERIOGRAM BILATERAL LOWER EXTREMITIES;  Surgeon: Fransisco Hertz, MD;  Location: [facility] OR;   Service: Vascular;  Laterality: Bilateral;   LOWER EXTREMITY ANGIOGRAPHY N/A 04/13/2018   Procedure: LOWER EXTREMITY ANGIOGRAPHY;  Surgeon: Fransisco Hertz, MD;  Location: [facility] INVASIVE CV LAB;  Service: Cardiovascular;  Laterality: N/A;  bilateral    UMBILICAL HERNIA REPAIR      ROS:   General:  No weight loss, Fever, chills  HEENT: No recent headaches, no nasal bleeding, no visual changes, no sore throat  Neurologic: No dizziness, blackouts, seizures. No recent symptoms of stroke or mini- stroke. No recent episodes of slurred speech, or temporary blindness.  Cardiac: No recent episodes of chest pain/pressure, no shortness of breath at rest.  Positive shortness of breath with exertion.  Denies history of atrial fibrillation or irregular heartbeat  Vascular: No history of rest pain in feet.  No history of claudication.  No history of non-healing ulcer, No history of DVT   Pulmonary: No home oxygen, no productive cough, no hemoptysis,  positive COPD asthma or wheezing  Musculoskeletal:  [ x] Arthritis, [ ]  Low back pain,  [ ]  Joint pain  Hematologic:No history of hypercoagulable state.  No history of easy bleeding.  No history of anemia  Gastrointestinal: No hematochezia or melena,  No gastroesophageal reflux, no trouble swallowing  Urinary: [ ]  chronic Kidney disease, [ ]  on HD - [ ]  MWF or [ ]  TTHS, [ ]  Burning with urination, [ ]  Frequent urination, [ ]  Difficulty urinating;   Skin: No rashes  Psychological: No history of anxiety,  No history of depression  Social History Social History   Tobacco Use   Smoking status: Former    Packs/day: 1.00    Years: 15.00    Additional pack years: 0.00    Total pack years: 15.00    Types: Cigarettes    Quit date: 06/06/1996    Years since quitting: 26.7   Smokeless tobacco: Never   Tobacco comments:    Quit 20 years ago  Vaping Use   Vaping Use: Never used  Substance Use Topics   Alcohol use: No    Alcohol/week: 0.0 standard drinks  of alcohol   Drug use: No    Family History Family History  Problem Relation Age of Onset   CVA Mother    Alzheimer's disease Mother    Heart attack Father    Heart disease Father    Diabetes Brother    Rectal cancer Brother    Stomach cancer Neg Hx    Colon cancer Neg Hx     Allergies  No Known Allergies   Current Outpatient Medications  Medication Sig Dispense Refill   Accu-Chek FastClix Lancets MISC      ACCU-CHEK GUIDE test strip USE STRIP TO CHECK GLUCOSE THREE TIMES DAILY     Acetaminophen (TYLENOL ARTHRITIS PAIN PO) Tylenol Arthritis Pain     acetaminophen (TYLENOL) 650 MG CR tablet Take 1,300 mg by mouth every 8 (eight) hours as needed for pain.     amLODipine (NORVASC)  10 MG tablet Take 10 mg by mouth daily.     aspirin EC 81 MG tablet Take 81 mg by mouth daily.     atorvastatin (LIPITOR) 80 MG tablet Take 80 mg by mouth daily.     atorvastatin (LIPITOR) 80 MG tablet Take 1 tablet by mouth daily.     clopidogrel (PLAVIX) 75 MG tablet Take 75 mg by mouth daily.     colchicine 0.6 MG tablet Take 0.6 mg by mouth daily as needed (gout).     ezetimibe (ZETIA) 10 MG tablet Take 10 mg by mouth daily.     HUMALOG KWIKPEN 100 UNIT/ML KwikPen Inject 8 Units into the skin 2 (two) times daily before a meal.     hydrALAZINE (APRESOLINE) 25 MG tablet Take 1 tablet (25 mg total) by mouth 3 (three) times daily. 270 tablet 3   Insulin Pen Needle (B-D ULTRAFINE III SHORT PEN) 31G X 8 MM MISC use pen needs 4 times a day with insulin  (DX: ICD10: E11.51)     Insulin Syringe-Needle U-100 31G X 5/16" 1 ML MISC use one needle once daily     LANTUS SOLOSTAR 100 UNIT/ML Solostar Pen Inject 28 Units into the skin 2 (two) times daily.     losartan-hydrochlorothiazide (HYZAAR) 100-25 MG per tablet Take 1 tablet by mouth daily.     Multiple Vitamins-Minerals (EMERGEN-C IMMUNE PLUS) PACK Take 1 Scoop by mouth daily.     nitroGLYCERIN (NITROSTAT) 0.4 MG SL tablet Place 1 tablet (0.4 mg total)  under the tongue every 5 (five) minutes as needed for chest pain. 25 tablet 3   omeprazole (PRILOSEC) 20 MG capsule Take 20 mg by mouth daily.     omeprazole (PRILOSEC) 20 MG capsule      Oxymetazoline HCl (VICKS SINEX 12 HOUR NA) Place 1 spray into both nostrils daily as needed (congestion).     tamsulosin (FLOMAX) 0.4 MG CAPS capsule Take 0.4 mg by mouth at bedtime.     No current facility-administered medications for this visit.    Physical Examination  Vitals:   02/28/23 1233  BP: (!) 159/65  Pulse: (!) 46  Resp: 16  Temp: 98.4 F (36.9 C)  TempSrc: Temporal  SpO2: 97%  Weight: 270 lb (122.5 kg)  Height: 5\' 9"  (1.753 m)    Body mass index is 39.87 kg/m.  General:  Alert and oriented, no acute distress HEENT: Normal Neck: No bruit or JVD Pulmonary: Clear to auscultation bilaterally Cardiac: Regular Rate and Rhythm without murmur Abdomen: Soft, non-tender, non-distended, no mass, no scars Skin: No rash Extremity: without ischemic changes Musculoskeletal: No deformity or edema  Neurologic: Upper and lower extremity motor 5/5 and symmetric  DATA:  ABI Findings:  +---------+------------------+-----+----------+----------------------------  ---+  Right   Rt Pressure (mmHg)IndexWaveform  Comment                           +---------+------------------+-----+----------+----------------------------  ---+  Brachial 154                                                                +---------+------------------+-----+----------+----------------------------  ---+  PTA     78  0.49 monophasic                                  +---------+------------------+-----+----------+----------------------------  ---+  PERO    62                0.39 monophasic                                  +---------+------------------+-----+----------+----------------------------  ---+  DP                             absent                                       +---------+------------------+-----+----------+----------------------------  ---+  Great Toe                                 Unable to obtain due to                                                     movement.                         +---------+------------------+-----+----------+----------------------------  ---+   +---------+------------------+-----+----------+----------------------------  ----+  Left    Lt Pressure (mmHg)IndexWaveform  Comment                            +---------+------------------+-----+----------+----------------------------  ----+  Brachial 159                                                                 +---------+------------------+-----+----------+----------------------------  ----+  PTA     96                0.60 monophasic                                   +---------+------------------+-----+----------+----------------------------  ----+  PERO    44                0.29 monophasic                                   +---------+------------------+-----+----------+----------------------------  ----+  DP                             absent                                       +---------+------------------+-----+----------+----------------------------  ----+  Great Toe  Unable to obtain due to  movement  +---------+------------------+-----+----------+----------------------------  ----+   +-------+-----------+-----------+------------+------------+  ABI/TBIToday's ABIToday's TBIPrevious ABIPrevious TBI  +-------+-----------+-----------+------------+------------+  Right 0.49                  0.44        0.31          +-------+-----------+-----------+------------+------------+  Left  0.60                  0.47        0.41          +-------+-----------+-----------+------------+------------+         Right ABIs appear essentially unchanged.  Left ABIs appears increased.    Summary:  Right: Resting right ankle-brachial index indicates severe right lower  extremity arterial disease.   Left: Resting left ankle-brachial index indicates moderate left lower  extremity arterial disease.    ASSESSMENT/PLAN:   PAD B LE without symptoms of rest pain or non healing wounds.  He states he has claudication that is tolerable and unchanged.   The ABI appears slightly improved.  He was unable to hold still for the TBI today.  On exam he has no ischemic skin changes.   He will f/u in 6 months with repeat ABI surveillance.  or sooner if he develops ischemic rest pain or non healing wounds.    I have encouraged the patient to ambulate as much as possible to try and promote collateral flow.  Cont. ASA, Plavix and LIpitor.  He states he will try to increase his activity.   Mosetta Pigeon PA-C [facility] [place] Office: [phone]  MD in clinic [place]

## 2023-03-01 ENCOUNTER — Encounter: Payer: Self-pay | Admitting: Physician Assistant

## 2023-03-07 ENCOUNTER — Other Ambulatory Visit: Payer: Self-pay

## 2023-03-07 DIAGNOSIS — I739 Peripheral vascular disease, unspecified: Secondary | ICD-10-CM

## 2023-04-18 NOTE — Progress Notes (Signed)
CARDIOLOGY CONSULT NOTE       Patient ID: Jacob Avery MRN: [id number] DOB/AGE: 78/03/1946 78 y.o.  Primary Physician: Gaspar Garbe, MD Primary Cardiologist: Eden Emms    HPI:  77 y.o. previously seen by Dr Purvis Sheffield and referred by DR [facility] for ongoing evaluation of patients PVD, CAD, HTN, and HLD.and DM  He has not had a heart cath Presumed CAD based on abnormal myovue done June 2019 showing inferoseptal MI with mild peri infarct ischemia. CRF;s include obesity, HTN, HLD non smoker. He is followed by [facility] having had a left femoral endarterectomy and left axillobifemoral bypass by Dr Imogene Burn in July 2019 He has chronic exertional dyspnea and fatigue likely from his obesity Echo in September 2019 showed EF 60-65% with no significant valve disease    October 2023 - Not clear how he got back on lopressor  Looks like PA from [facility] started it back chronic bradycardia. Monitor 01/31/20 showed no high grade AV block with average HR of 48 bpm Office visit 08/09/22   ECG with HR 37 no AV block  Lopressor d/c and started on hydralazine   He has had more calf claudication  Duplex with occluded left axfem graft   ABI:s 02/28/23 right 0.49 and left 0.60 Seen by [facility] PA  02/28/23 and since no rest pain/ tissue loss conservative Rx Sees podiatrist every 3 months  Has two daughters in areas and 6 grand kids They all get together for holidays Wife's health is pretty good  Discussed weight loss issues.   ROS All other systems reviewed and negative except as noted above  Past Medical History:  Diagnosis Date   Arthritis    Chronic kidney disease    Stage 2   CVA (cerebral vascular accident) (HCC) 10/2014    Late effect of cerebrovascular disease.  Mostly resolved, but can see difference in facial expressions.  tp-A, Plavix afterward   Diabetes mellitus without complication (HCC)    type II, controlled, with renal comps   Diabetic foot (HCC) 04/05/2018   Normal sensation, skin intact    Diverticulosis    ED (erectile dysfunction)    GI bleed 04/2017   Gout    History of claustrophobia    with MRI   Hx of adenomatous polyp of rectum 06/10/2017   Hypercholesterolemia    Hypertension    Long term (current) use of insulin (HCC)    Remains on basal/bolus therapy without hypoglycemia.   Microalbuminuria 2013   Morbid obesity (HCC)    Obesity    OSA (obstructive sleep apnea)    Intolerant to CPAP   Peripheral vascular disease (HCC)    Right calf pain 04/05/2018   Vertigo     Family History  Problem Relation Age of Onset   CVA Mother    Alzheimer's disease Mother    Heart attack Father    Heart disease Father    Diabetes Brother    Rectal cancer Brother    Stomach cancer Neg Hx    Colon cancer Neg Hx     Social History   Socioeconomic History   Marital status: Married    Spouse name: [name]   Number of children: 2   Years of education: hs gr   Highest education level: Not on file  Occupational History   Occupation: retired    Comment: Adjuster  Tobacco Use   Smoking status: Former    Packs/day: 1.00    Years: 15.00    Additional pack years: 0.00  Total pack years: 15.00    Types: Cigarettes    Quit date: 06/06/1996    Years since quitting: 26.9   Smokeless tobacco: Never   Tobacco comments:    Quit 20 years ago  Vaping Use   Vaping Use: Never used  Substance and Sexual Activity   Alcohol use: No    Alcohol/week: 0.0 standard drinks of alcohol   Drug use: No   Sexual activity: Not Currently    Partners: Female    Birth control/protection: None  Other Topics Concern   Not on file  Social History Narrative   Patient is married with 2 children.   Patient is right handed.   Patient has hs education.   Patient drinks tea on sundays, sodas occ.   Social Determinants of Health   Financial Resource Strain: Not on file  Food Insecurity: Not on file  Transportation Needs: Not on file  Physical Activity: Not on file  Stress: Not on file   Social Connections: Not on file  Intimate Partner Violence: Not on file    Past Surgical History:  Procedure Laterality Date   ABDOMINAL AORTOGRAM N/A 04/13/2018   Procedure: ABDOMINAL AORTOGRAM;  Surgeon: Fransisco Hertz, MD;  Location: [facility] INVASIVE CV LAB;  Service: Cardiovascular;  Laterality: N/A;   APPLICATION OF WOUND VAC Bilateral 05/30/2018   Procedure: APPLICATION OF WOUND VAC;  Surgeon: Fransisco Hertz, MD;  Location: [facility] OR;  Service: Vascular;  Laterality: Bilateral;   AXILLARY-FEMORAL BYPASS GRAFT Left 05/30/2018   Procedure: BYPASS GRAFT LEFT AXILLA-BIFEMORAL WITH HEMO SHIELD GOLD VASCULAR GRAFTS;  Surgeon: Fransisco Hertz, MD;  Location: [facility] OR;  Service: Vascular;  Laterality: Left;   CATARACT EXTRACTION W/PHACO Right 07/03/2021   Procedure: CATARACT EXTRACTION PHACO AND INTRAOCULAR LENS PLACEMENT (IOC);  Surgeon: Fabio Pierce, MD;  Location: [facility];  Service: Ophthalmology;  Laterality: Right;  CDE 6.99   CATARACT EXTRACTION W/PHACO Left 07/17/2021   Procedure: CATARACT EXTRACTION PHACO AND INTRAOCULAR LENS PLACEMENT (IOC);  Surgeon: Fabio Pierce, MD;  Location: [facility];  Service: Ophthalmology;  Laterality: Left;  CDE 6.92   COLONOSCOPY WITH PROPOFOL N/A 06/08/2017   Procedure: COLONOSCOPY WITH PROPOFOL ( Ultra-Slim Scope);  Surgeon: Iva Boop, MD;  Location: [facility];  Service: Endoscopy;  Laterality: N/A;   ENDARTERECTOMY FEMORAL Bilateral 05/30/2018   Procedure: ENDARTERECTOMY FEMORAL LEFT;  Surgeon: Fransisco Hertz, MD;  Location: [facility] OR;  Service: Vascular;  Laterality: Bilateral;   INTRAOPERATIVE ARTERIOGRAM Bilateral 05/30/2018   Procedure: INTRA OPERATIVE ARTERIOGRAM BILATERAL LOWER EXTREMITIES;  Surgeon: Fransisco Hertz, MD;  Location: [facility] OR;  Service: Vascular;  Laterality: Bilateral;   LOWER EXTREMITY ANGIOGRAPHY N/A 04/13/2018   Procedure: LOWER EXTREMITY ANGIOGRAPHY;  Surgeon: Fransisco Hertz, MD;  Location: [facility] INVASIVE CV LAB;  Service: Cardiovascular;  Laterality: N/A;   bilateral    UMBILICAL HERNIA REPAIR        Current Outpatient Medications:    Accu-Chek FastClix Lancets MISC, , Disp: , Rfl:    ACCU-CHEK GUIDE test strip, USE STRIP TO CHECK GLUCOSE THREE TIMES DAILY, Disp: , Rfl:    acetaminophen (TYLENOL) 650 MG CR tablet, Take 1,300 mg by mouth every 8 (eight) hours as needed for pain., Disp: , Rfl:    amLODipine (NORVASC) 10 MG tablet, Take 10 mg by mouth daily., Disp: , Rfl:    aspirin EC 81 MG tablet, Take 81 mg by mouth daily., Disp: , Rfl:    atorvastatin (LIPITOR) 80 MG tablet, Take 1 tablet by  mouth daily., Disp: , Rfl:    clopidogrel (PLAVIX) 75 MG tablet, Take 75 mg by mouth daily., Disp: , Rfl:    colchicine 0.6 MG tablet, Take 0.6 mg by mouth daily as needed (gout)., Disp: , Rfl:    ezetimibe (ZETIA) 10 MG tablet, Take 10 mg by mouth daily., Disp: , Rfl:    HUMALOG KWIKPEN 100 UNIT/ML KwikPen, Inject 8 Units into the skin 2 (two) times daily before a meal., Disp: , Rfl:    hydrALAZINE (APRESOLINE) 25 MG tablet, Take 1 tablet (25 mg total) by mouth 3 (three) times daily., Disp: 270 tablet, Rfl: 3   Insulin Pen Needle (B-D ULTRAFINE III SHORT PEN) 31G X 8 MM MISC, use pen needs 4 times a day with insulin  (DX: ICD10: E11.51), Disp: , Rfl:    Insulin Syringe-Needle U-100 31G X 5/16" 1 ML MISC, use one needle once daily, Disp: , Rfl:    LANTUS SOLOSTAR 100 UNIT/ML Solostar Pen, Inject 28 Units into the skin 2 (two) times daily., Disp: , Rfl:    losartan-hydrochlorothiazide (HYZAAR) 100-25 MG per tablet, Take 1 tablet by mouth daily., Disp: , Rfl:    Multiple Vitamins-Minerals (EMERGEN-C IMMUNE PLUS) PACK, Take 1 Scoop by mouth daily., Disp: , Rfl:    nitroGLYCERIN (NITROSTAT) 0.4 MG SL tablet, Place 1 tablet (0.4 mg total) under the tongue every 5 (five) minutes as needed for chest pain. (Patient taking differently: Place 0.4 mg under the tongue every 5 (five) minutes x 3 doses as needed for chest pain (if no relief after 3rd dose, proceed to ED  or call 911).), Disp: 25 tablet, Rfl: 3   omeprazole (PRILOSEC) 20 MG capsule, Take 20 mg by mouth daily., Disp: , Rfl:    Oxymetazoline HCl (VICKS SINEX 12 HOUR NA), Place 1 spray into both nostrils daily as needed (congestion)., Disp: , Rfl:    tamsulosin (FLOMAX) 0.4 MG CAPS capsule, Take 0.4 mg by mouth at bedtime., Disp: , Rfl:     Physical Exam: Blood pressure (!) 150/70, pulse (!) 48, height 5\' 9"  (1.753 m), weight 268 lb 3.2 oz (121.7 kg), SpO2 93 %.   Affect appropriate Obese black male  HEENT: normal Neck supple with no adenopathy JVP normal no bruits no thyromegaly Lungs clear with no wheezing and good diaphragmatic motion Heart:  S1/S2 no murmur, no rub, gallop or click PMI normal Abdomen: benighn, BS positve, no tenderness, no AAA no bruit.  No HSM or HJR Neuro non-focal Skin warm and dry Post left axillary /fem grafting with fem fem and decreased peripheral pulses    Labs:   Lab Results  Component Value Date   WBC 8.8 05/31/2018   HGB 10.3 (L) 05/31/2018   HCT 33.7 (L) 05/31/2018   MCV 79.5 05/31/2018   PLT 242 05/31/2018   No results for input(s): "NA", "K", "CL", "CO2", "BUN", "CREATININE", "CALCIUM", "PROT", "BILITOT", "ALKPHOS", "ALT", "AST", "GLUCOSE" in the last 168 hours.  Invalid input(s): "LABALBU" No results found for: "CKTOTAL", "CKMB", "CKMBINDEX", "TROPONINI"  Lab Results  Component Value Date   CHOL 138 10/26/2014   Lab Results  Component Value Date   HDL 29 (L) 10/26/2014   Lab Results  Component Value Date   LDLCALC 91 10/26/2014   Lab Results  Component Value Date   TRIG 91 10/26/2014   Lab Results  Component Value Date   CHOLHDL 4.8 10/26/2014   No results found for: "LDLDIRECT"    Radiology: No results found.  EKG: Sno  new ECG   ASSESSMENT AND PLAN:   1. CAD:  Presumed based on abnormal myovue in 2019 no chest pain observe   2. PVD: post Ax-Fem occluded with significant claudication and ABI's in 0.49 on right  F/U  [facility] I suspect he will need some sort of intervention in future Currently no rest pain or tissue loss   3. HTN:   bradycardia on lopressor started on hydralazine 08/09/22 improved   4. HLD:  Continue statin labs with primary   5. DM:  Discussed low carb diet.  Target hemoglobin A1c is 6.5 or less.  Continue current medications.  6. Bradycardia:  better off lopressor   F/U in 6 months    Signed: Charlton Haws 04/29/2023, 11:29 AM

## 2023-04-29 ENCOUNTER — Ambulatory Visit: Payer: Medicare Other | Attending: Cardiovascular Disease | Admitting: Cardiovascular Disease

## 2023-04-29 ENCOUNTER — Encounter: Payer: Self-pay | Admitting: Cardiovascular Disease

## 2023-04-29 VITALS — BP 150/70 | HR 48 | Ht 69.0 in | Wt 268.2 lb

## 2023-04-29 DIAGNOSIS — I2510 Atherosclerotic heart disease of native coronary artery without angina pectoris: Secondary | ICD-10-CM | POA: Diagnosis not present

## 2023-04-29 DIAGNOSIS — E782 Mixed hyperlipidemia: Secondary | ICD-10-CM | POA: Diagnosis not present

## 2023-04-29 DIAGNOSIS — I10 Essential (primary) hypertension: Secondary | ICD-10-CM | POA: Diagnosis not present

## 2023-04-29 DIAGNOSIS — I739 Peripheral vascular disease, unspecified: Secondary | ICD-10-CM

## 2023-04-29 NOTE — Patient Instructions (Signed)
Medication Instructions:  Your physician recommends that you continue on your current medications as directed. Please refer to the Current Medication list given to you today.   Labwork: None today  Testing/Procedures: None today  Follow-Up: 1 year Dr.Nishan  Any Other Special Instructions Will Be Listed Below (If Applicable).  If you need a refill on your cardiac medications before your next appointment, please call your pharmacy.  

## 2023-07-20 ENCOUNTER — Ambulatory Visit (INDEPENDENT_AMBULATORY_CARE_PROVIDER_SITE_OTHER): Payer: Medicare Other | Admitting: Podiatry

## 2023-07-20 ENCOUNTER — Encounter: Payer: Self-pay | Admitting: Podiatry

## 2023-07-20 DIAGNOSIS — B351 Tinea unguium: Secondary | ICD-10-CM

## 2023-07-20 DIAGNOSIS — M79675 Pain in left toe(s): Secondary | ICD-10-CM | POA: Diagnosis not present

## 2023-07-20 DIAGNOSIS — M79674 Pain in right toe(s): Secondary | ICD-10-CM

## 2023-07-20 DIAGNOSIS — E1151 Type 2 diabetes mellitus with diabetic peripheral angiopathy without gangrene: Secondary | ICD-10-CM | POA: Diagnosis not present

## 2023-07-20 NOTE — Progress Notes (Signed)
This patient returns to my office for at risk foot care.  This patient requires this care by a professional since this patient will be at risk due to having diabetes and CKD.  This patient is unable to cut nails himself since the patient cannot reach his nails.These nails are painful walking and wearing shoes.  This patient presents for at risk foot care today.  General Appearance  Alert, conversant and in no acute stress.  Vascular  Dorsalis pedis and posterior tibial  pulses are  weakly palpable  bilaterally.  Capillary return is within normal limits  bilaterally. Temperature is within normal limits  bilaterally.  Neurologic  Senn-Weinstein monofilament wire test within normal limits  bilaterally. Muscle power within normal limits bilaterally.  Nails Thick disfigured discolored nails with subungual debris  from hallux to fifth toes bilaterally. No evidence of bacterial infection or drainage bilaterally.  Orthopedic  No limitations of motion  feet .  No crepitus or effusions noted.  No bony pathology or digital deformities noted.  Midfoot  DJD.  Skin  normotropic skin with no porokeratosis noted bilaterally.  No signs of infections or ulcers noted.     Onychomycosis  Pain in right toes  Pain in left toes  Consent was obtained for treatment procedures.   Mechanical debridement of nails 1-5  bilaterally performed with a nail nipper.  Filed with dremel without incident.    Return office visit      3 months                Told patient to return for periodic foot care and evaluation due to potential at risk complications.   Helane Gunther DPM

## 2023-08-24 ENCOUNTER — Telehealth: Payer: Self-pay | Admitting: Cardiovascular Disease

## 2023-08-24 MED ORDER — HYDRALAZINE HCL 25 MG PO TABS: 25.0000 mg | ORAL_TABLET | Freq: Three times a day (TID) | ORAL | 3 refills | Status: AC

## 2023-08-24 NOTE — Telephone Encounter (Signed)
*  STAT* If patient is at the pharmacy, call can be transferred to refill team.   1. Which medications need to be refilled? (please list name of each medication and dose if known) hydrALAZINE (APRESOLINE) 25 MG tablet (Expired)   2. Which pharmacy/location (including street and city if local pharmacy) is medication to be sent to?  [id number] - [place], [place] - [address]      3. Do they need a 30 day or 90 day supply? 90 day

## 2023-08-24 NOTE — Telephone Encounter (Signed)
Medication refill request completed.  

## 2023-09-12 ENCOUNTER — Ambulatory Visit (INDEPENDENT_AMBULATORY_CARE_PROVIDER_SITE_OTHER): Payer: Medicare Other | Admitting: Physician Assistant

## 2023-09-12 ENCOUNTER — Ambulatory Visit (HOSPITAL_COMMUNITY)
Admission: RE | Admit: 2023-09-12 | Discharge: 2023-09-12 | Disposition: A | Discharge status: Home / Self Care | Payer: Medicare Other | Source: Ambulatory Visit | Attending: Surgery | Admitting: Surgery

## 2023-09-12 VITALS — BP 159/74 | HR 48 | Temp 98.3°F | Ht 69.0 in | Wt 262.9 lb

## 2023-09-12 DIAGNOSIS — I739 Peripheral vascular disease, unspecified: Secondary | ICD-10-CM

## 2023-09-12 LAB — VAS US ABI WITH/WO TBI
Left ABI: 0.51
Right ABI: 0.49

## 2023-09-12 NOTE — Progress Notes (Signed)
Office Note   History of Present Illness   Jacob Avery is a 77 y.o. (06-Oct-1946) male who presents for surveillance of PAD.  He has a history of left axillobifemoral bypass graft and left femoral endarterectomy on 05/30/2018 by Dr. Imogene Burn.  This was done for critical limb ischemia with rest pain bilaterally and a known abdominal aortic dissection.  His axillobifemoral bypass graft has been known to be occluded for over a year now.  We have previously discussed with the patient he would require repeat intervention should he develop rest pain or tissue loss.   The patient returns today for follow up.  He denies any recent medical history changes.  He has stable bilateral lower extremity claudication.  He is usually able to walk to his mailbox and halfway back before he has to stop.  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  He denies any rest pain or tissue loss.  Current Outpatient Medications  Medication Sig Dispense Refill   Accu-Chek FastClix Lancets MISC      ACCU-CHEK GUIDE test strip USE STRIP TO CHECK GLUCOSE THREE TIMES DAILY     acetaminophen (TYLENOL) 650 MG CR tablet Take 1,300 mg by mouth every 8 (eight) hours as needed for pain.     amLODipine (NORVASC) 10 MG tablet Take 10 mg by mouth daily.     aspirin EC 81 MG tablet Take 81 mg by mouth daily.     atorvastatin (LIPITOR) 80 MG tablet Take 1 tablet by mouth daily.     clopidogrel (PLAVIX) 75 MG tablet Take 75 mg by mouth daily.     colchicine 0.6 MG tablet Take 0.6 mg by mouth daily as needed (gout).     ezetimibe (ZETIA) 10 MG tablet Take 10 mg by mouth daily.     HUMALOG KWIKPEN 100 UNIT/ML KwikPen Inject 8 Units into the skin 2 (two) times daily before a meal.     hydrALAZINE (APRESOLINE) 25 MG tablet Take 1 tablet (25 mg total) by mouth 3 (three) times daily. 270 tablet 3   Insulin Pen Needle (B-D ULTRAFINE III SHORT PEN) 31G X 8 MM MISC use pen needs 4 times a day with insulin  (DX: ICD10: E11.51)      Insulin Syringe-Needle U-100 31G X 5/16" 1 ML MISC use one needle once daily     LANTUS SOLOSTAR 100 UNIT/ML Solostar Pen Inject 28 Units into the skin 2 (two) times daily.     losartan-hydrochlorothiazide (HYZAAR) 100-25 MG per tablet Take 1 tablet by mouth daily.     Multiple Vitamins-Minerals (EMERGEN-C IMMUNE PLUS) PACK Take 1 Scoop by mouth daily.     nitroGLYCERIN (NITROSTAT) 0.4 MG SL tablet Place 1 tablet (0.4 mg total) under the tongue every 5 (five) minutes as needed for chest pain. (Patient taking differently: Place 0.4 mg under the tongue every 5 (five) minutes x 3 doses as needed for chest pain (if no relief after 3rd dose, proceed to ED or call 911).) 25 tablet 3   omeprazole (PRILOSEC) 20 MG capsule Take 20 mg by mouth daily.     Oxymetazoline HCl (VICKS SINEX 12 HOUR NA) Place 1 spray into both nostrils daily as needed (congestion).     tamsulosin (FLOMAX) 0.4 MG CAPS capsule Take 0.4 mg by mouth at bedtime.     No current facility-administered medications for this visit.    REVIEW OF SYSTEMS (negative unless checked):   Cardiac:  []  Chest pain or chest pressure? []   Shortness of breath upon activity? []  Shortness of breath when lying flat? []  Irregular heart rhythm?  Vascular:  []  Pain in calf, thigh, or hip brought on by walking? []  Pain in feet at night that wakes you up from your sleep? []  Blood clot in your veins? []  Leg swelling?  Pulmonary:  []  Oxygen at home? []  Productive cough? []  Wheezing?  Neurologic:  []  Sudden weakness in arms or legs? []  Sudden numbness in arms or legs? []  Sudden onset of difficult speaking or slurred speech? []  Temporary loss of vision in one eye? []  Problems with dizziness?  Gastrointestinal:  []  Blood in stool? []  Vomited blood?  Genitourinary:  []  Burning when urinating? []  Blood in urine?  Psychiatric:  []  Major depression  Hematologic:  []  Bleeding problems? []  Problems with blood clotting?  Dermatologic:   []  Rashes or ulcers?  Constitutional:  []  Fever or chills?  Ear/Nose/Throat:  []  Change in hearing? []  Nose bleeds? []  Sore throat?  Musculoskeletal:  []  Back pain? []  Joint pain? []  Muscle pain?   Physical Examination   Vitals:   09/12/23 1046  BP: (!) 159/74  Pulse: (!) 48  Temp: 98.3 F (36.8 C)  TempSrc: Temporal  SpO2: 92%  Weight: 262 lb 14.4 oz (119.3 kg)  Height: 5\' 9"  (1.753 m)   Body mass index is 38.82 kg/m.  General:  WDWN in NAD; vital signs documented above Gait: Not observed HENT: WNL, normocephalic Pulmonary: normal non-labored breathing , without rales, rhonchi,  wheezing Cardiac: regular Abdomen: soft, NT, no masses Skin: without rashes Vascular Exam/Pulses: Monophasic right AT Doppler signal, monophasic left PT Doppler signal Extremities: without ischemic changes, without gangrene , without cellulitis; without open wounds;  Musculoskeletal: no muscle wasting or atrophy  Neurologic: A&O X 3;  No focal weakness or paresthesias are detected Psychiatric:  The pt has Normal affect.  Non-Invasive Vascular imaging   ABI (09/12/2023) R:  ABI: 0.49 (0.49),  PT: mono DP:  Dampened monophasic TBI: 0.51 L:  ABI: 0.51 (0.6),  PT: mono DP:  Dampened monophasic TBI: 0.54   Medical Decision Making   Jacob Avery is a 77 y.o. male who presents for surveillance of PAD  Based on the patient's vascular studies, his ABIs are essentially unchanged bilaterally.  His right ABI 0.49.  His left ABI 0.51 He has a known occluded left axillobifemoral bypass.  He denies any rest pain or tissue loss.  He has stable bilateral lower extremity claudication On exam he has a monophasic right AT Doppler signal and monophasic left PT Doppler signal He can follow-up with our office in 6 months with repeat ABIs   Loel Dubonnet PA-C [facility] Office: [phone]  Clinic MD: Myra Gianotti

## 2023-09-23 ENCOUNTER — Other Ambulatory Visit: Payer: Self-pay

## 2023-09-23 DIAGNOSIS — I739 Peripheral vascular disease, unspecified: Secondary | ICD-10-CM

## 2023-10-19 ENCOUNTER — Ambulatory Visit (INDEPENDENT_AMBULATORY_CARE_PROVIDER_SITE_OTHER): Payer: Medicare Other | Admitting: Podiatry

## 2023-10-19 ENCOUNTER — Encounter: Payer: Self-pay | Admitting: Podiatry

## 2023-10-19 DIAGNOSIS — M79675 Pain in left toe(s): Secondary | ICD-10-CM

## 2023-10-19 DIAGNOSIS — M79674 Pain in right toe(s): Secondary | ICD-10-CM | POA: Diagnosis not present

## 2023-10-19 DIAGNOSIS — E1151 Type 2 diabetes mellitus with diabetic peripheral angiopathy without gangrene: Secondary | ICD-10-CM | POA: Diagnosis not present

## 2023-10-19 DIAGNOSIS — B351 Tinea unguium: Secondary | ICD-10-CM | POA: Diagnosis not present

## 2023-10-19 NOTE — Progress Notes (Signed)
This patient returns to my office for at risk foot care.  This patient requires this care by a professional since this patient will be at risk due to having diabetes and CKD.  This patient is unable to cut nails himself since the patient cannot reach his nails.These nails are painful walking and wearing shoes.  This patient presents for at risk foot care today.  General Appearance  Alert, conversant and in no acute stress.  Vascular  Dorsalis pedis and posterior tibial  pulses are  weakly palpable  bilaterally.  Capillary return is within normal limits  bilaterally. Temperature is within normal limits  bilaterally.  Neurologic  Senn-Weinstein monofilament wire test within normal limits  bilaterally. Muscle power within normal limits bilaterally.  Nails Thick disfigured discolored nails with subungual debris  from hallux to fifth toes bilaterally. No evidence of bacterial infection or drainage bilaterally.  Orthopedic  No limitations of motion  feet .  No crepitus or effusions noted.  No bony pathology or digital deformities noted.  Midfoot  DJD.  Skin  normotropic skin with no porokeratosis noted bilaterally.  No signs of infections or ulcers noted.     Onychomycosis  Pain in right toes  Pain in left toes  Consent was obtained for treatment procedures.   Mechanical debridement of nails 1-5  bilaterally performed with a nail nipper.  Filed with dremel without incident.    Return office visit      3 months                Told patient to return for periodic foot care and evaluation due to potential at risk complications.   Helane Gunther DPM

## 2024-01-25 ENCOUNTER — Encounter: Payer: Self-pay | Admitting: Podiatry

## 2024-01-25 ENCOUNTER — Ambulatory Visit (INDEPENDENT_AMBULATORY_CARE_PROVIDER_SITE_OTHER): Payer: Medicare Other | Admitting: Podiatry

## 2024-01-25 DIAGNOSIS — E1151 Type 2 diabetes mellitus with diabetic peripheral angiopathy without gangrene: Secondary | ICD-10-CM

## 2024-01-25 DIAGNOSIS — M79675 Pain in left toe(s): Secondary | ICD-10-CM

## 2024-01-25 DIAGNOSIS — B351 Tinea unguium: Secondary | ICD-10-CM | POA: Diagnosis not present

## 2024-01-25 DIAGNOSIS — M79674 Pain in right toe(s): Secondary | ICD-10-CM

## 2024-01-25 NOTE — Progress Notes (Signed)
This patient returns to my office for at risk foot care.  This patient requires this care by a professional since this patient will be at risk due to having diabetes and CKD.  This patient is unable to cut nails himself since the patient cannot reach his nails.These nails are painful walking and wearing shoes.  This patient presents for at risk foot care today.  General Appearance  Alert, conversant and in no acute stress.  Vascular  Dorsalis pedis and posterior tibial  pulses are  weakly palpable  bilaterally.  Capillary return is within normal limits  bilaterally. Temperature is within normal limits  bilaterally.  Neurologic  Senn-Weinstein monofilament wire test within normal limits  bilaterally. Muscle power within normal limits bilaterally.  Nails Thick disfigured discolored nails with subungual debris  from hallux to fifth toes bilaterally. No evidence of bacterial infection or drainage bilaterally.  Orthopedic  No limitations of motion  feet .  No crepitus or effusions noted.  No bony pathology or digital deformities noted.  Midfoot  DJD.  Skin  normotropic skin with no porokeratosis noted bilaterally.  No signs of infections or ulcers noted.     Onychomycosis  Pain in right toes  Pain in left toes  Consent was obtained for treatment procedures.   Mechanical debridement of nails 1-5  bilaterally performed with a nail nipper.  Filed with dremel without incident.    Return office visit      3 months                Told patient to return for periodic foot care and evaluation due to potential at risk complications.   Helane Gunther DPM

## 2024-01-29 ENCOUNTER — Observation Stay (HOSPITAL_COMMUNITY)

## 2024-01-29 ENCOUNTER — Emergency Department (HOSPITAL_COMMUNITY)

## 2024-01-29 ENCOUNTER — Observation Stay (HOSPITAL_COMMUNITY)
Admission: EM | Admit: 2024-01-29 | Discharge: 2024-01-30 | Disposition: A | Discharge status: Home / Self Care | Attending: Emergency Medicine | Admitting: Emergency Medicine

## 2024-01-29 ENCOUNTER — Encounter (HOSPITAL_COMMUNITY): Payer: Self-pay | Admitting: *Deleted

## 2024-01-29 ENCOUNTER — Other Ambulatory Visit: Payer: Self-pay

## 2024-01-29 DIAGNOSIS — N182 Chronic kidney disease, stage 2 (mild): Secondary | ICD-10-CM | POA: Insufficient documentation

## 2024-01-29 DIAGNOSIS — E66812 Obesity, class 2: Secondary | ICD-10-CM | POA: Diagnosis not present

## 2024-01-29 DIAGNOSIS — E785 Hyperlipidemia, unspecified: Secondary | ICD-10-CM

## 2024-01-29 DIAGNOSIS — K219 Gastro-esophageal reflux disease without esophagitis: Secondary | ICD-10-CM | POA: Insufficient documentation

## 2024-01-29 DIAGNOSIS — I639 Cerebral infarction, unspecified: Principal | ICD-10-CM | POA: Diagnosis present

## 2024-01-29 DIAGNOSIS — E1122 Type 2 diabetes mellitus with diabetic chronic kidney disease: Secondary | ICD-10-CM | POA: Diagnosis not present

## 2024-01-29 DIAGNOSIS — E119 Type 2 diabetes mellitus without complications: Secondary | ICD-10-CM | POA: Diagnosis not present

## 2024-01-29 DIAGNOSIS — I5032 Chronic diastolic (congestive) heart failure: Secondary | ICD-10-CM | POA: Insufficient documentation

## 2024-01-29 DIAGNOSIS — Z87891 Personal history of nicotine dependence: Secondary | ICD-10-CM | POA: Diagnosis not present

## 2024-01-29 DIAGNOSIS — E162 Hypoglycemia, unspecified: Secondary | ICD-10-CM

## 2024-01-29 DIAGNOSIS — R410 Disorientation, unspecified: Secondary | ICD-10-CM | POA: Insufficient documentation

## 2024-01-29 DIAGNOSIS — Z794 Long term (current) use of insulin: Secondary | ICD-10-CM | POA: Diagnosis not present

## 2024-01-29 DIAGNOSIS — Z6838 Body mass index (BMI) 38.0-38.9, adult: Secondary | ICD-10-CM | POA: Diagnosis not present

## 2024-01-29 DIAGNOSIS — Z7982 Long term (current) use of aspirin: Secondary | ICD-10-CM | POA: Diagnosis not present

## 2024-01-29 DIAGNOSIS — I63541 Cerebral infarction due to unspecified occlusion or stenosis of right cerebellar artery: Secondary | ICD-10-CM | POA: Diagnosis not present

## 2024-01-29 DIAGNOSIS — N400 Enlarged prostate without lower urinary tract symptoms: Secondary | ICD-10-CM | POA: Insufficient documentation

## 2024-01-29 DIAGNOSIS — Z79899 Other long term (current) drug therapy: Secondary | ICD-10-CM | POA: Diagnosis not present

## 2024-01-29 DIAGNOSIS — I130 Hypertensive heart and chronic kidney disease with heart failure and stage 1 through stage 4 chronic kidney disease, or unspecified chronic kidney disease: Secondary | ICD-10-CM | POA: Diagnosis not present

## 2024-01-29 DIAGNOSIS — R531 Weakness: Secondary | ICD-10-CM | POA: Diagnosis present

## 2024-01-29 DIAGNOSIS — I10 Essential (primary) hypertension: Secondary | ICD-10-CM | POA: Diagnosis not present

## 2024-01-29 DIAGNOSIS — M6281 Muscle weakness (generalized): Secondary | ICD-10-CM | POA: Diagnosis not present

## 2024-01-29 DIAGNOSIS — Z7902 Long term (current) use of antithrombotics/antiplatelets: Secondary | ICD-10-CM | POA: Insufficient documentation

## 2024-01-29 LAB — CBC WITH DIFFERENTIAL/PLATELET
Abs Immature Granulocytes: 0.02 10*3/uL (ref 0.00–0.07)
Basophils Absolute: 0 10*3/uL (ref 0.0–0.1)
Basophils Relative: 0 %
Eosinophils Absolute: 0 10*3/uL (ref 0.0–0.5)
Eosinophils Relative: 0 %
HCT: 40.1 % (ref 39.0–52.0)
Hemoglobin: 12.3 g/dL — ABNORMAL LOW (ref 13.0–17.0)
Immature Granulocytes: 0 %
Lymphocytes Relative: 10 %
Lymphs Abs: 0.9 10*3/uL (ref 0.7–4.0)
MCH: 25 pg — ABNORMAL LOW (ref 26.0–34.0)
MCHC: 30.7 g/dL (ref 30.0–36.0)
MCV: 81.5 fL (ref 80.0–100.0)
Monocytes Absolute: 0.4 10*3/uL (ref 0.1–1.0)
Monocytes Relative: 4 %
Neutro Abs: 8.2 10*3/uL — ABNORMAL HIGH (ref 1.7–7.7)
Neutrophils Relative %: 86 %
Platelets: 343 10*3/uL (ref 150–400)
RBC: 4.92 MIL/uL (ref 4.22–5.81)
RDW: 15.9 % — ABNORMAL HIGH (ref 11.5–15.5)
WBC: 9.5 10*3/uL (ref 4.0–10.5)
nRBC: 0 % (ref 0.0–0.2)

## 2024-01-29 LAB — URINALYSIS, ROUTINE W REFLEX MICROSCOPIC
Bacteria, UA: NONE SEEN
Bilirubin Urine: NEGATIVE
Glucose, UA: NEGATIVE mg/dL
Hgb urine dipstick: NEGATIVE
Ketones, ur: NEGATIVE mg/dL
Leukocytes,Ua: NEGATIVE
Nitrite: NEGATIVE
Protein, ur: 30 mg/dL — AB
Specific Gravity, Urine: 1.014 (ref 1.005–1.030)
pH: 6 (ref 5.0–8.0)

## 2024-01-29 LAB — TROPONIN I (HIGH SENSITIVITY)
Troponin I (High Sensitivity): 11 ng/L (ref <18)
Troponin I (High Sensitivity): 11 ng/L (ref <18)

## 2024-01-29 LAB — COMPREHENSIVE METABOLIC PANEL WITH GFR
ALT: 12 U/L (ref 0–44)
AST: 13 U/L — ABNORMAL LOW (ref 15–41)
Albumin: 3.6 g/dL (ref 3.5–5.0)
Alkaline Phosphatase: 72 U/L (ref 38–126)
Anion gap: 9 (ref 5–15)
BUN: 19 mg/dL (ref 8–23)
CO2: 26 mmol/L (ref 22–32)
Calcium: 9 mg/dL (ref 8.9–10.3)
Chloride: 104 mmol/L (ref 98–111)
Creatinine, Ser: 1.06 mg/dL (ref 0.61–1.24)
GFR, Estimated: >60 mL/min (ref >60)
Glucose, Bld: 156 mg/dL — ABNORMAL HIGH (ref 70–99)
Potassium: 3.4 mmol/L — ABNORMAL LOW (ref 3.5–5.1)
Sodium: 139 mmol/L (ref 135–145)
Total Bilirubin: 0.6 mg/dL (ref 0.0–1.2)
Total Protein: 7 g/dL (ref 6.5–8.1)

## 2024-01-29 LAB — MAGNESIUM: Magnesium: 1.8 mg/dL (ref 1.7–2.4)

## 2024-01-29 LAB — CBG MONITORING, ED
Glucose-Capillary: 190 mg/dL — ABNORMAL HIGH (ref 70–99)
Glucose-Capillary: 112 mg/dL — ABNORMAL HIGH (ref 70–99)

## 2024-01-29 MED ORDER — AMLODIPINE BESYLATE 5 MG PO TABS
10.0000 mg | ORAL_TABLET | Freq: Every day | ORAL | Status: DC
  Administered 2024-01-30 (×2): 10 mg via ORAL
  Filled 2024-01-29 (×2): qty 2

## 2024-01-29 MED ORDER — LORAZEPAM 1 MG PO TABS
1.0000 mg | ORAL_TABLET | Freq: Once | ORAL | Status: AC
  Administered 2024-01-29: 1 mg via ORAL
  Filled 2024-01-29: qty 1

## 2024-01-29 MED ORDER — TRAZODONE HCL 50 MG PO TABS
25.0000 mg | ORAL_TABLET | Freq: Every evening | ORAL | Status: DC | PRN
  Administered 2024-01-30: 25 mg via ORAL
  Filled 2024-01-29: qty 1

## 2024-01-29 MED ORDER — HYDRALAZINE HCL 20 MG/ML IJ SOLN
10.0000 mg | Freq: Once | INTRAMUSCULAR | Status: AC
  Administered 2024-01-29: 10 mg via INTRAVENOUS
  Filled 2024-01-29: qty 1

## 2024-01-29 MED ORDER — ACETAMINOPHEN 325 MG PO TABS
650.0000 mg | ORAL_TABLET | ORAL | Status: DC | PRN
  Administered 2024-01-30 (×2): 650 mg via ORAL
  Filled 2024-01-29 (×2): qty 2

## 2024-01-29 MED ORDER — ATORVASTATIN CALCIUM 40 MG PO TABS
80.0000 mg | ORAL_TABLET | Freq: Every day | ORAL | Status: DC
  Administered 2024-01-30: 80 mg via ORAL
  Filled 2024-01-29: qty 2

## 2024-01-29 MED ORDER — TAMSULOSIN HCL 0.4 MG PO CAPS
0.4000 mg | ORAL_CAPSULE | Freq: Every day | ORAL | Status: DC
  Administered 2024-01-30: 0.4 mg via ORAL
  Filled 2024-01-29: qty 1

## 2024-01-29 MED ORDER — ACETAMINOPHEN 650 MG RE SUPP: 650.0000 mg | RECTAL | Status: DC | PRN

## 2024-01-29 MED ORDER — IOHEXOL 350 MG/ML SOLN
75.0000 mL | Freq: Once | INTRAVENOUS | Status: AC | PRN
  Administered 2024-01-29: 75 mL via INTRAVENOUS

## 2024-01-29 MED ORDER — ASPIRIN 81 MG PO TBEC
81.0000 mg | DELAYED_RELEASE_TABLET | Freq: Every day | ORAL | Status: DC
  Administered 2024-01-30 (×2): 81 mg via ORAL
  Filled 2024-01-29 (×2): qty 1

## 2024-01-29 MED ORDER — COLCHICINE 0.6 MG PO TABS: 0.6000 mg | ORAL_TABLET | Freq: Every day | ORAL | Status: DC | PRN

## 2024-01-29 MED ORDER — ONDANSETRON HCL 4 MG/2ML IJ SOLN: 4.0000 mg | INTRAMUSCULAR | Status: DC | PRN

## 2024-01-29 MED ORDER — SENNOSIDES-DOCUSATE SODIUM 8.6-50 MG PO TABS: 1.0000 | ORAL_TABLET | Freq: Every evening | ORAL | Status: DC | PRN

## 2024-01-29 MED ORDER — LOSARTAN POTASSIUM 50 MG PO TABS
100.0000 mg | ORAL_TABLET | Freq: Every day | ORAL | Status: DC
  Administered 2024-01-30: 100 mg via ORAL
  Filled 2024-01-29: qty 2

## 2024-01-29 MED ORDER — LOSARTAN POTASSIUM-HCTZ 100-25 MG PO TABS: 1.0000 | ORAL_TABLET | Freq: Every day | ORAL | Status: DC

## 2024-01-29 MED ORDER — HYDROCHLOROTHIAZIDE 25 MG PO TABS
25.0000 mg | ORAL_TABLET | Freq: Every day | ORAL | Status: DC
  Administered 2024-01-30: 25 mg via ORAL
  Filled 2024-01-29: qty 1

## 2024-01-29 MED ORDER — SODIUM CHLORIDE 0.9 % IV SOLN: INTRAVENOUS | Status: DC

## 2024-01-29 MED ORDER — PANTOPRAZOLE SODIUM 40 MG PO TBEC
40.0000 mg | DELAYED_RELEASE_TABLET | Freq: Every day | ORAL | Status: DC
  Administered 2024-01-30: 40 mg via ORAL
  Filled 2024-01-29: qty 1

## 2024-01-29 MED ORDER — HYDRALAZINE HCL 25 MG PO TABS
25.0000 mg | ORAL_TABLET | Freq: Three times a day (TID) | ORAL | Status: DC
  Administered 2024-01-30 (×2): 25 mg via ORAL
  Filled 2024-01-29 (×2): qty 1

## 2024-01-29 MED ORDER — STROKE: EARLY STAGES OF RECOVERY BOOK: Freq: Once | Status: AC

## 2024-01-29 MED ORDER — CLOPIDOGREL BISULFATE 75 MG PO TABS
75.0000 mg | ORAL_TABLET | Freq: Every day | ORAL | Status: DC
  Administered 2024-01-30 (×2): 75 mg via ORAL
  Filled 2024-01-29 (×2): qty 1

## 2024-01-29 MED ORDER — ENOXAPARIN SODIUM 60 MG/0.6ML IJ SOSY
60.0000 mg | PREFILLED_SYRINGE | INTRAMUSCULAR | Status: DC
  Administered 2024-01-29: 60 mg via SUBCUTANEOUS
  Filled 2024-01-29: qty 0.6

## 2024-01-29 MED ORDER — NITROGLYCERIN 0.4 MG SL SUBL: 0.4000 mg | SUBLINGUAL_TABLET | SUBLINGUAL | Status: DC | PRN

## 2024-01-29 MED ORDER — ASPIRIN 81 MG PO TBEC: 81.0000 mg | DELAYED_RELEASE_TABLET | Freq: Every day | ORAL | Status: DC

## 2024-01-29 MED ORDER — EZETIMIBE 10 MG PO TABS
10.0000 mg | ORAL_TABLET | Freq: Every day | ORAL | Status: DC
  Administered 2024-01-30: 10 mg via ORAL
  Filled 2024-01-29: qty 1

## 2024-01-29 MED ORDER — ACETAMINOPHEN 160 MG/5ML PO SOLN: 650.0000 mg | ORAL | Status: DC | PRN

## 2024-01-29 NOTE — ED Notes (Signed)
 Pt to MRI at this time.

## 2024-01-29 NOTE — H&P (Addendum)
 [place]   PATIENT NAME: Jacob Avery    MR#:  [id number]  DATE OF BIRTH:  [birth year]  DATE OF ADMISSION:  01/29/2024  PRIMARY CARE PHYSICIAN: [name], Kristina Pfeiffer, MD   Patient is coming from: Home  REQUESTING/REFERRING PHYSICIAN: Cheyenne Cotta, MD  CHIEF COMPLAINT:  Incoherence  HISTORY OF PRESENT ILLNESS:  Jacob Avery is a 78 y.o. African-American male with medical history significant for CVA, type 2 diabetes mellitus, essential hypertension, dyslipidemia, peripheral vascular disease and diverticulosis, who presented to the emergency room with acute onset of altered mental status with confusion and incoherence that started yesterday.  The patient's daughter stated that his blood glucose was 42 earlier today and after having an orange juice it came up to 64.  The patient was noted to be dropping things from his left hand today and has been off balance.  He felt weak in the left upper extremity.  No significant tingling or numbness.  No dysphagia or dysarthria.  He has been having mild cough and dyspnea with wheezing mainly on exertion however not significantly more than his usual baseline.  No fever or chills.  He denied any urinary or stool incontinence.  No tinnitus or vertigo.  No witnessed seizures.  He has been taking aspirin  daily.  It is unclear if he has been taking Plavix  regularly.  ED Course: When the patient came to the ER, BP was 150/69 with a heart rate of 54 otherwise normal vital signs.  Labs revealed mild hypokalemia 3.4 and a blood glucose of 156 with otherwise normal vital signs.  High-sensitivity troponin I was 11 twice.  CBC was unremarkable.  UA showed 30 protein and was otherwise negative. EKG as reviewed by me : EKG showed mild sinus bradycardia with a rate of 52. Imaging: Portable chest x-ray showed no acute cardiopulmonary disease. Brain MRI without contrast revealed the following: 1. Numerous small acute right cerebral infarcts involving cortex greater  than white matter in the right frontal, parietal, occipital, and temporal lobes (MCA and PCA territories/watershed regions). Punctate acute infarcts are also present in the right basal ganglia. 2. Mild-to-moderate chronic small vessel ischemic disease.  The patient was given 10 mg of IV hydralazine  and 1 mg of p.o. Ativan .  He will be admitted to an observation telemetry bed for further evaluation and management. PAST MEDICAL HISTORY:   Past Medical History:  Diagnosis Date   Arthritis    Chronic kidney disease    Stage 2   CVA (cerebral vascular accident) (HCC) 10/2014    Late effect of cerebrovascular disease.  Mostly resolved, but can see difference in facial expressions.  tp-A, Plavix  afterward   Diabetes mellitus without complication (HCC)    type II, controlled, with renal comps   Diabetic foot (HCC) 04/05/2018   Normal sensation, skin intact   Diverticulosis    ED (erectile dysfunction)    GI bleed 04/2017   Gout    History of claustrophobia    with MRI   Hx of adenomatous polyp of rectum 06/10/2017   Hypercholesterolemia    Hypertension    Long term (current) use of insulin  (HCC)    Remains on basal/bolus therapy without hypoglycemia.   Microalbuminuria 2013   Morbid obesity (HCC)    Obesity    OSA (obstructive sleep apnea)    Intolerant to CPAP   Peripheral vascular disease (HCC)    Right calf pain 04/05/2018   Vertigo     PAST SURGICAL HISTORY:  Past Surgical History:  Procedure Laterality Date   ABDOMINAL AORTOGRAM N/A 04/13/2018   Procedure: ABDOMINAL AORTOGRAM;  Surgeon: Arvil Lauber, MD;  Location: [facility] INVASIVE CV LAB;  Service: Cardiovascular;  Laterality: N/A;   APPLICATION OF WOUND VAC Bilateral 05/30/2018   Procedure: APPLICATION OF WOUND VAC;  Surgeon: Arvil Lauber, MD;  Location: [facility] OR;  Service: Vascular;  Laterality: Bilateral;   AXILLARY-FEMORAL BYPASS GRAFT Left 05/30/2018   Procedure: BYPASS GRAFT LEFT AXILLA-BIFEMORAL WITH HEMO SHIELD GOLD  VASCULAR GRAFTS;  Surgeon: Arvil Lauber, MD;  Location: [facility] OR;  Service: Vascular;  Laterality: Left;   CATARACT EXTRACTION W/PHACO Right 07/03/2021   Procedure: CATARACT EXTRACTION PHACO AND INTRAOCULAR LENS PLACEMENT (IOC);  Surgeon: Tarri Farm, MD;  Location: [facility] ORS;  Service: Ophthalmology;  Laterality: Right;  CDE 6.99   CATARACT EXTRACTION W/PHACO Left 07/17/2021   Procedure: CATARACT EXTRACTION PHACO AND INTRAOCULAR LENS PLACEMENT (IOC);  Surgeon: Tarri Farm, MD;  Location: [facility] ORS;  Service: Ophthalmology;  Laterality: Left;  CDE 6.92   COLONOSCOPY WITH PROPOFOL  N/A 06/08/2017   Procedure: COLONOSCOPY WITH PROPOFOL  ( Ultra-Slim Scope);  Surgeon: Kenney Peacemaker, MD;  Location: [facility];  Service: Endoscopy;  Laterality: N/A;   ENDARTERECTOMY Right 02/09/2024   Procedure: RIGHT CAROTID ENDARTERECTOMY WITH PATCH ANGIOPLASTY USING XENOSURE BIOLOGIC PATCH 1 CM x 14 CM;  Surgeon: Adine Hoof, MD;  Location: [facility] OR;  Service: Vascular;  Laterality: Right;   ENDARTERECTOMY FEMORAL Bilateral 05/30/2018   Procedure: ENDARTERECTOMY FEMORAL LEFT;  Surgeon: Arvil Lauber, MD;  Location: [facility] OR;  Service: Vascular;  Laterality: Bilateral;   INTRAOPERATIVE ARTERIOGRAM Bilateral 05/30/2018   Procedure: INTRA OPERATIVE ARTERIOGRAM BILATERAL LOWER EXTREMITIES;  Surgeon: Arvil Lauber, MD;  Location[facility] OR;  Service: Vascular;  Laterality: Bilateral;   LOWER EXTREMITY ANGIOGRAPHY N/A 04/13/2018   Procedure: LOWER EXTREMITY ANGIOGRAPHY;  Surgeon: Arvil Lauber, MD;  Location: [facility] INVASIVE CV LAB;  Service: Cardiovascular;  Laterality: N/A;  bilateral    PATCH ANGIOPLASTY Right 02/09/2024   Procedure: ANGIOPLASTY, USING PATCH GRAFT;  Surgeon: Adine Hoof, MD;  Location: [facility] OR;  Service: Vascular;  Laterality: Right;   UMBILICAL HERNIA REPAIR      SOCIAL HISTORY:   Social History   Tobacco Use   Smoking status: Former    Current packs/day: 0.00    Average packs/day: 1  pack/day for 15.0 years (15.0 ttl pk-yrs)    Types: Cigarettes    Start date: 06/06/1981    Quit date: 06/06/1996    Years since quitting: 27.7   Smokeless tobacco: Never   Tobacco comments:    Quit 20 years ago  Substance Use Topics   Alcohol use: No    Alcohol/week: 0.0 standard drinks of alcohol    FAMILY HISTORY:   Family History  Problem Relation Age of Onset   CVA Mother    Alzheimer's disease Mother    Heart attack Father    Heart disease Father    Diabetes Brother    Rectal cancer Brother    Stomach cancer Neg Hx    Colon cancer Neg Hx     DRUG ALLERGIES:  No Known Allergies  REVIEW OF SYSTEMS:   ROS As per history of present illness. All pertinent systems were reviewed above. Constitutional, HEENT, cardiovascular, respiratory, GI, GU, musculoskeletal, neuro, psychiatric, endocrine, integumentary and hematologic systems were reviewed and are otherwise negative/unremarkable except for positive findings mentioned above in the HPI.   MEDICATIONS AT HOME:   Prior  to Admission medications   Medication Sig Start Date End Date Taking? Authorizing Provider  Accu-Chek FastClix Lancets MISC  01/10/19   [provider]  ACCU-CHEK GUIDE test strip USE STRIP TO CHECK GLUCOSE THREE TIMES DAILY 02/08/19   [provider]  acetaminophen  (TYLENOL ) 650 MG CR tablet Take 1,300 mg by mouth every 8 (eight) hours as needed for pain.    [provider]  amLODipine  (NORVASC ) 10 MG tablet Take 10 mg by mouth daily.    [provider]  aspirin  EC 81 MG tablet Take 81 mg by mouth daily.    [provider]  atorvastatin  (LIPITOR ) 80 MG tablet Take 1 tablet by mouth daily.    [provider]  clopidogrel  (PLAVIX ) 75 MG tablet Take 75 mg by mouth daily.    [provider]  colchicine  0.6 MG tablet Take 0.6 mg by mouth daily as needed (gout). 02/24/11   [provider]  ezetimibe  (ZETIA ) 10 MG tablet Take 10 mg by mouth daily.     [provider]  HUMALOG KWIKPEN 100 UNIT/ML KwikPen Inject 8 Units into the skin 2 (two) times daily before a meal. 10/01/20   [provider]  hydrALAZINE  (APRESOLINE ) 25 MG tablet Take 1 tablet (25 mg total) by mouth 3 (three) times daily. 08/24/23 08/18/24  Loyde Rule, MD  Insulin  Pen Needle (B-D ULTRAFINE III SHORT PEN) 31G X 8 MM MISC use pen needs 4 times a day with insulin   (DX: ICD10: E11.51) 03/02/10   [provider]  Insulin  Syringe-Needle U-100 31G X 5/16" 1 ML MISC use one needle once daily 11/23/10   [provider]  LANTUS  SOLOSTAR 100 UNIT/ML Solostar Pen Inject 28 Units into the skin 2 (two) times daily. 12/14/16   [provider]  losartan -hydrochlorothiazide  (HYZAAR) 100-25 MG per tablet Take 1 tablet by mouth daily. 08/17/14   [provider]  Multiple Vitamins-Minerals (EMERGEN-C IMMUNE PLUS) PACK Take 1 Scoop by mouth daily.    [provider]  nitroGLYCERIN  (NITROSTAT ) 0.4 MG SL tablet Place 1 tablet (0.4 mg total) under the tongue every 5 (five) minutes as needed for chest pain. Patient taking differently: Place 0.4 mg under the tongue every 5 (five) minutes x 3 doses as needed for chest pain (if no relief after 3rd dose, proceed to ED or call 911). 06/28/18   Flavia Hughs, MD  omeprazole (PRILOSEC) 20 MG capsule Take 20 mg by mouth daily. 08/24/21   [provider]  Oxymetazoline  HCl (VICKS SINEX 12 HOUR NA) Place 1 spray into both nostrils daily as needed (congestion).    [provider]  tamsulosin  (FLOMAX ) 0.4 MG CAPS capsule Take 0.4 mg by mouth at bedtime.    [provider]      VITAL SIGNS:  Blood pressure (!) 144/55, pulse (!) 59, temperature 98.2 F (36.8 C), temperature source Oral, resp. rate 19, height 5\' 9"  (1.753 m), weight 118.3 kg, SpO2 96%.  PHYSICAL EXAMINATION:  Physical Exam  GENERAL:  78 y.o.-year-old African-American male patient lying in the bed  with no acute distress.  EYES: Pupils equal, round, reactive to light and accommodation. No scleral icterus. Extraocular muscles intact.  HEENT: Head atraumatic, normocephalic. Oropharynx and nasopharynx clear.  NECK:  Supple, no jugular venous distention. No thyroid  enlargement, no tenderness.  LUNGS: Normal breath sounds bilaterally, no wheezing, rales,rhonchi or crepitation. No use of accessory muscles of respiration.  CARDIOVASCULAR: Regular rate and rhythm, S1, S2 normal. No murmurs,  rubs, or gallops.  ABDOMEN: Soft, nondistended, nontender. Bowel sounds present. No organomegaly or mass.  EXTREMITIES: No pedal edema, cyanosis, or clubbing.  NEUROLOGIC: Cranial nerves II through XII are intact. Muscle strength 5/5 in all extremities except for MS of 4/5 in the left lower extremity. Sensation intact. Gait not checked.  PSYCHIATRIC: The patient is alert and oriented x 3.  Normal affect and good eye contact. SKIN: No obvious rash, lesion, or ulcer.   LABORATORY PANEL:   CBC No results for input(s): "WBC", "HGB", "HCT", "PLT" in the last 168 hours.  ------------------------------------------------------------------------------------------------------------------  Chemistries  No results for input(s): "NA", "K", "CL", "CO2", "GLUCOSE", "BUN", "CREATININE", "CALCIUM ", "MG", "AST", "ALT", "ALKPHOS", "BILITOT" in the last 168 hours.  Invalid input(s): "GFRCGP"  ------------------------------------------------------------------------------------------------------------------  Cardiac Enzymes No results for input(s): "TROPONINI" in the last 168 hours. ------------------------------------------------------------------------------------------------------------------  RADIOLOGY:  No results found.     IMPRESSION AND PLAN:  Assessment and Plan: * Acute CVA (cerebrovascular accident) (HCC) - This involves cortex greater than white matter in the right frontal, parietal, occipital, and  temporal lobes (MCA and PCA territories/watershed regions) as well as punctate acute infarcts which are also present in the right basal ganglia.  This is highly suspicious for embolic etiology given multiple cerebral infarcts. - The patient has subsequent altered mental status with confusion, incoherence, left sided weakness and imbalance. - The patient will be admitted to an observation medically monitored bed.   - We will follow neuro checks q.4 hours for 24 hours.   - The patient will be continued on aspirin  and we will resume Plavix . - Will obtain CTA of the head and neck and 2D echo with bubble study .   - A neurology consultation  as well as physical/occupation/speech therapy consults will be obtained in a.m.Aaron Aas - I notified Dr. Merceda Stairs about the patient. -- PT/OT and speech therapy consults will be obtaines given his acute CVA. - The patient will be placed on statin therapy and fasting lipids will be checked.   Essential hypertension - We will continue antihypertensive therapy. - We will allow permissive hypertension for now.  Dyslipidemia - We will continue statin therapy and Zetia . - We will check fasting lipids in AM.  Type 2 diabetes mellitus without complications (HCC) - The patient will be placed on supplemental coverage with NovoLog . - We will continue basal coverage.  GERD without esophagitis - We will continue PPI therapy.  BPH (benign prostatic hyperplasia) - We will continue Flomax .    DVT prophylaxis: Lovenox .  Advanced Care Planning:  Code Status: full code.  Family Communication:  The plan of care was discussed in details with the patient (and family). I answered all questions. The patient agreed to proceed with the above mentioned plan. Further management will depend upon hospital course. Disposition Plan: Back to previous home environment Consults called: Neurology. All the records are reviewed and case discussed with ED provider.  Status is:  Observation  I certify that at the time of admission, it is my clinical judgment that the patient will require  hospital care extending less than 2 midnights.                            Dispo: The patient is from: Home              Anticipated d/c is to: Home              Patient currently is not medically stable  to d/c.              Difficult to place patient: No  Virgene Griffin M.D on 02/25/2024 at 7:28 PM  [facility]   From 7 PM-7 AM, contact night-coverage [email or web address]  CC: Primary care physician; [name], Kristina Pfeiffer, MD

## 2024-01-29 NOTE — Assessment & Plan Note (Addendum)
-   We will continue statin therapy and Zetia. - We will check fasting lipids in AM.

## 2024-01-29 NOTE — Assessment & Plan Note (Signed)
 -  The patient will be placed on supplemental coverage with NovoLog. - We will continue basal coverage.

## 2024-01-29 NOTE — ED Notes (Signed)
 PA made aware of pts hypertension.

## 2024-01-29 NOTE — Assessment & Plan Note (Signed)
 -  We will continue Flomax

## 2024-01-29 NOTE — Assessment & Plan Note (Signed)
-   We will continue antihypertensive therapy. - We will allow permissive hypertension for now.

## 2024-01-29 NOTE — Assessment & Plan Note (Signed)
 -  We will continue PPI therapy

## 2024-01-29 NOTE — ED Triage Notes (Signed)
 Pt BIB [facility] for hypoglycemia, took oral DM medication and did not eat.  CBG 45 at home, oral glucose given by EMS and CBG increased to 82, rechecked just PTA and CBG 162

## 2024-01-29 NOTE — ED Notes (Signed)
 ED TO INPATIENT HANDOFF REPORT  ED Nurse Name and Phone #: Luciana Axe Name/Age/Gender Jacob Avery 78 y.o. male Room/Bed: APA10/APA10  Code Status   Code Status: Prior  Home/SNF/Other Home Patient oriented to: self, place, time, and situation Is this baseline? Yes   Triage Complete: Triage complete  Chief Complaint Acute CVA (cerebrovascular accident) [facility]) [I63.9]  Triage Note Pt BIB [facility] for hypoglycemia, took oral DM medication and did not eat.  CBG 45 at home, oral glucose given by EMS and CBG increased to 82, rechecked just PTA and CBG 162   Allergies No Known Allergies  Level of Care/Admitting Diagnosis ED Disposition     ED Disposition  Admit   Condition  --   Comment  Hospital Area: [facility] [[id number]]  Level of Care: Telemetry [5]  Covid Evaluation: Asymptomatic - no recent exposure (last 10 days) testing not required  Diagnosis: Acute CVA (cerebrovascular accident) [facility]) [[id number]]  Admitting Physician: Hannah Beat [[id number]]  Attending Physician: Hannah Beat [[id number]]          B Medical/Surgery History Past Medical History:  Diagnosis Date   Arthritis    Chronic kidney disease    Stage 2   CVA (cerebral vascular accident) (HCC) 10/2014    Late effect of cerebrovascular disease.  Mostly resolved, but can see difference in facial expressions.  tp-A, Plavix afterward   Diabetes mellitus without complication (HCC)    type II, controlled, with renal comps   Diabetic foot (HCC) 04/05/2018   Normal sensation, skin intact   Diverticulosis    ED (erectile dysfunction)    GI bleed 04/2017   Gout    History of claustrophobia    with MRI   Hx of adenomatous polyp of rectum 06/10/2017   Hypercholesterolemia    Hypertension    Long term (current) use of insulin (HCC)    Remains on basal/bolus therapy without hypoglycemia.   Microalbuminuria 2013   Morbid obesity (HCC)    Obesity    OSA (obstructive sleep apnea)    Intolerant to  CPAP   Peripheral vascular disease (HCC)    Right calf pain 04/05/2018   Vertigo    Past Surgical History:  Procedure Laterality Date   ABDOMINAL AORTOGRAM N/A 04/13/2018   Procedure: ABDOMINAL AORTOGRAM;  Surgeon: Fransisco Hertz, MD;  [facility] INVASIVE CV LAB;  Service: Cardiovascular;  Laterality: N/A;   APPLICATION OF WOUND VAC Bilateral 05/30/2018   Procedure: APPLICATION OF WOUND VAC;  Surgeon: Fransisco Hertz, MD;  Location: [facility] OR;  Service: Vascular;  Laterality: Bilateral;   AXILLARY-FEMORAL BYPASS GRAFT Left 05/30/2018   Procedure: BYPASS GRAFT LEFT AXILLA-BIFEMORAL WITH HEMO SHIELD GOLD VASCULAR GRAFTS;  Surgeon: Fransisco Hertz, MD;  Location: [facility] OR;  Service: Vascular;  Laterality: Left;   CATARACT EXTRACTION W/PHACO Right 07/03/2021   Procedure: CATARACT EXTRACTION PHACO AND INTRAOCULAR LENS PLACEMENT (IOC);  Surgeon: Fabio Pierce, MD;  Location: [facility] ORS;  Service: Ophthalmology;  Laterality: Right;  CDE 6.99   CATARACT EXTRACTION W/PHACO Left 07/17/2021   Procedure: CATARACT EXTRACTION PHACO AND INTRAOCULAR LENS PLACEMENT (IOC);  Surgeon: Fabio Pierce, MD;  Location: [facility] ORS;  Service: Ophthalmology;  Laterality: Left;  CDE 6.92   COLONOSCOPY WITH PROPOFOL N/A 06/08/2017   Procedure: COLONOSCOPY WITH PROPOFOL ( Ultra-Slim Scope);  Surgeon: Iva Boop, MD;  Location: [facility];  Service: Endoscopy;  Laterality: N/A;   ENDARTERECTOMY FEMORAL Bilateral 05/30/2018   Procedure: ENDARTERECTOMY FEMORAL LEFT;  Surgeon: Leonides Sake  L, MD;  Location: [facility] OR;  Service: Vascular;  Laterality: Bilateral;   INTRAOPERATIVE ARTERIOGRAM Bilateral 05/30/2018   Procedure: INTRA OPERATIVE ARTERIOGRAM BILATERAL LOWER EXTREMITIES;  Surgeon: Fransisco Hertz, MD;  Location: [facility] OR;  Service: Vascular;  Laterality: Bilateral;   LOWER EXTREMITY ANGIOGRAPHY N/A 04/13/2018   Procedure: LOWER EXTREMITY ANGIOGRAPHY;  Surgeon: Fransisco Hertz, MD;  Location: [facility] INVASIVE CV LAB;  Service: Cardiovascular;  Laterality:  N/A;  bilateral    UMBILICAL HERNIA REPAIR       A IV Location/Drains/Wounds Patient Lines/Drains/Airways Status     Active Line/Drains/Airways     Name Placement date Placement time Site Days   Peripheral IV 01/29/24 22 G 1" Posterior;Right Hand 01/29/24  1715  Hand  less than 1   Negative Pressure Wound Therapy Groin Bilateral 05/30/18  1102  --  2070   Wound / Incision (Open or Dehisced) 06/01/18 Leg Right;Lower 06/01/18  1945  Leg  2068            Intake/Output Last 24 hours No intake or output data in the 24 hours ending 01/29/24 2026  Labs/Imaging Results for orders placed or performed during the hospital encounter of 01/29/24 (from the past 48 hours)  CBG monitoring, ED     Status: Abnormal   Collection Time: 01/29/24  2:39 PM  Result Value Ref Range   Glucose-Capillary 190 (H) 70 - 99 mg/dL    Comment: Glucose reference range applies only to samples taken after fasting for at least 8 hours.  Comprehensive metabolic panel     Status: Abnormal   Collection Time: 01/29/24  3:03 PM  Result Value Ref Range   Sodium 139 135 - 145 mmol/L   Potassium 3.4 (L) 3.5 - 5.1 mmol/L   Chloride 104 98 - 111 mmol/L   CO2 26 22 - 32 mmol/L   Glucose, Bld 156 (H) 70 - 99 mg/dL    Comment: Glucose reference range applies only to samples taken after fasting for at least 8 hours.   BUN 19 8 - 23 mg/dL   Creatinine, Ser 0.34 0.61 - 1.24 mg/dL   Calcium 9.0 8.9 - 74.2 mg/dL   Total Protein 7.0 6.5 - 8.1 g/dL   Albumin 3.6 3.5 - 5.0 g/dL   AST 13 (L) 15 - 41 U/L   ALT 12 0 - 44 U/L   Alkaline Phosphatase 72 38 - 126 U/L   Total Bilirubin 0.6 0.0 - 1.2 mg/dL   GFR, Estimated >59 >56 mL/min    Comment: (NOTE) Calculated using the CKD-EPI Creatinine Equation (2021)    Anion gap 9 5 - 15    Comment: Performed at [facility], [address] [place], [place] [address]  CBC with Differential     Status: Abnormal   Collection Time: 01/29/24  3:03 PM  Result Value Ref Range   WBC 9.5  4.0 - 10.5 K/uL   RBC 4.92 4.22 - 5.81 MIL/uL   Hemoglobin 12.3 (L) 13.0 - 17.0 g/dL   HCT 43.3 29.5 - 18.8 %   MCV 81.5 80.0 - 100.0 fL   MCH 25.0 (L) 26.0 - 34.0 pg   MCHC 30.7 30.0 - 36.0 g/dL   RDW 41.6 (H) 60.6 - 30.1 %   Platelets 343 150 - 400 K/uL   nRBC 0.0 0.0 - 0.2 %   Neutrophils Relative % 86 %   Neutro Abs 8.2 (H) 1.7 - 7.7 K/uL   Lymphocytes Relative 10 %   Lymphs Abs  0.9 0.7 - 4.0 K/uL   Monocytes Relative 4 %   Monocytes Absolute 0.4 0.1 - 1.0 K/uL   Eosinophils Relative 0 %   Eosinophils Absolute 0.0 0.0 - 0.5 K/uL   Basophils Relative 0 %   Basophils Absolute 0.0 0.0 - 0.1 K/uL   Immature Granulocytes 0 %   Abs Immature Granulocytes 0.02 0.00 - 0.07 K/uL    Comment: Performed at [facility], [address] [place], [place] [address]  Magnesium     Status: None   Collection Time: 01/29/24  3:03 PM  Result Value Ref Range   Magnesium 1.8 1.7 - 2.4 mg/dL    Comment: Performed at [facility], [address], [place], [place] [address]  Troponin I (High Sensitivity)     Status: None   Collection Time: 01/29/24  3:03 PM  Result Value Ref Range   Troponin I (High Sensitivity) 11 <18 ng/L    Comment: (NOTE) Elevated high sensitivity troponin I (hsTnI) values and significant  changes across serial measurements may suggest ACS but many other  chronic and acute conditions are known to elevate hsTnI results.  Refer to the "Links" section for chest pain algorithms and additional  guidance. Performed at [facility], [address] [place], [place] [address]   Urinalysis, Routine w reflex microscopic -Urine, Clean Catch     Status: Abnormal   Collection Time: 01/29/24  3:42 PM  Result Value Ref Range   Color, Urine YELLOW YELLOW   APPearance CLEAR CLEAR   Specific Gravity, Urine 1.014 1.005 - 1.030   pH 6.0 5.0 - 8.0   Glucose, UA NEGATIVE NEGATIVE mg/dL   Hgb urine dipstick NEGATIVE NEGATIVE   Bilirubin Urine NEGATIVE NEGATIVE   Ketones, ur NEGATIVE NEGATIVE  mg/dL   Protein, ur 30 (A) NEGATIVE mg/dL   Nitrite NEGATIVE NEGATIVE   Leukocytes,Ua NEGATIVE NEGATIVE   RBC / HPF 0-5 0 - 5 RBC/hpf   WBC, UA 0-5 0 - 5 WBC/hpf   Bacteria, UA NONE SEEN NONE SEEN   Squamous Epithelial / HPF 0-5 0 - 5 /HPF    Comment: Performed at [facility], [address], [place], [place] [address]  CBG monitoring, ED     Status: Abnormal   Collection Time: 01/29/24  4:39 PM  Result Value Ref Range   Glucose-Capillary 112 (H) 70 - 99 mg/dL    Comment: Glucose reference range applies only to samples taken after fasting for at least 8 hours.  Troponin I (High Sensitivity)     Status: None   Collection Time: 01/29/24  4:47 PM  Result Value Ref Range   Troponin I (High Sensitivity) 11 <18 ng/L    Comment: (NOTE) Elevated high sensitivity troponin I (hsTnI) values and significant  changes across serial measurements may suggest ACS but many other  chronic and acute conditions are known to elevate hsTnI results.  Refer to the "Links" section for chest pain algorithms and additional  guidance. Performed at [facility], [address] [place], [place] [address]    MR BRAIN WO CONTRAST Result Date: 01/29/2024 CLINICAL DATA:  Delirium. EXAM: MRI HEAD WITHOUT CONTRAST TECHNIQUE: Multiplanar, multiecho pulse sequences of the brain and surrounding structures were obtained without intravenous contrast. COMPARISON:  Head MRI 10/26/2014 FINDINGS: Brain: There are numerous small acute infarcts involving cortex greater than white matter in the right frontal, parietal, occipital, and temporal lobes (MCA and PCA territories/watershed regions). Punctate acute infarcts are also present in the right basal ganglia. There are  no acute infarcts in the left cerebral hemisphere or posterior fossa. There are small chronic right frontal and parietal cortical infarcts with a small amount of associated hemosiderin. T2 hyperintensities elsewhere in the cerebral white matter bilaterally and in the  pons are nonspecific but compatible with mild-to-moderate chronic small vessel ischemic disease. There are chronic lacunar infarcts in the right basal ganglia, left thalamus, and right cerebellar hemisphere. There is mild-to-moderate cerebral atrophy. Vascular: Major intracranial vascular flow voids are preserved. Skull and upper cervical spine: Unremarkable bone marrow signal. Sinuses/Orbits: Bilateral cataract extraction. Small mucous retention cyst in the right maxillary sinus. Clear mastoid air cells. Other: None. IMPRESSION: 1. Numerous small acute right cerebral infarcts. 2. Mild-to-moderate chronic small vessel ischemic disease. Electronically Signed   By: Sebastian Ache M.D.   On: 01/29/2024 18:25   DG Chest Port 1 View Result Date: 01/29/2024 CLINICAL DATA:  Dyspnea. EXAM: PORTABLE CHEST 1 VIEW COMPARISON:  05/30/2018. FINDINGS: Low lung volume. Bilateral lung fields are clear. Bilateral costophrenic angles are clear. Mildly enlarged cardio-mediastinal silhouette, which is likely accentuated by low lung volume and AP technique. No acute osseous abnormalities. The soft tissues are within normal limits. IMPRESSION: No active disease. Electronically Signed   By: Jules Schick M.D.   On: 01/29/2024 15:19    Pending Labs Unresulted Labs (From admission, onward)    None       Vitals/Pain Today's Vitals   01/29/24 1820 01/29/24 1821 01/29/24 1930 01/29/24 2000  BP: (!) 183/63  (!) 188/63   Pulse: 67  (!) 50 (!) 50  Resp: (!) 22  20 20   Temp:  98.1 F (36.7 C)    TempSrc:  Oral    SpO2: 98%  96% 95%  Weight:      Height:      PainSc:        Isolation Precautions No active isolations  Medications Medications  LORazepam (ATIVAN) tablet 1 mg (1 mg Oral Given 01/29/24 1634)  hydrALAZINE (APRESOLINE) injection 10 mg (10 mg Intravenous Given 01/29/24 1719)    Mobility walks     Focused Assessments    R Recommendations: See Admitting Provider Note  Report given to:  Cheyanne  Additional Notes:

## 2024-01-29 NOTE — ED Notes (Signed)
Pt given a sprite 

## 2024-01-29 NOTE — ED Provider Notes (Signed)
 [place] EMERGENCY DEPARTMENT AT [facility] Provider Note   CSN: [id number] Arrival date & time: 01/29/24  1433     History  No chief complaint on file.   Jacob Avery is a 78 y.o. male.  Patient is a 78 year old male who presents to the emergency department with a chief complaint of a bout of hypoglycemia earlier today.  Patient notes that he did take his Lantus this morning and did not eat afterwards.  He does note that his blood sugar dropped into the 40s and family notes that they did give him orange juice and EMS did feed him as well.  Blood sugar has improved on presentation to the emergency department.  Patient does note that he was having some generalized weakness yesterday as well as some shortness of breath but did feel his baseline this morning.  Daughter does note that the patient has been more confused over the past 2 days.  Patient denies any active chest pain, shortness of breath, abdominal pain, nausea, vomiting, diarrhea.  He has had no associated syncopal events.  He denies any associated headache.        Home Medications Prior to Admission medications   Medication Sig Start Date End Date Taking? Authorizing Provider  Accu-Chek FastClix Lancets MISC  01/10/19   [provider]  ACCU-CHEK GUIDE test strip USE STRIP TO CHECK GLUCOSE THREE TIMES DAILY 02/08/19   [provider]  acetaminophen (TYLENOL) 650 MG CR tablet Take 1,300 mg by mouth every 8 (eight) hours as needed for pain.    [provider]  amLODipine (NORVASC) 10 MG tablet Take 10 mg by mouth daily.    [provider]  aspirin EC 81 MG tablet Take 81 mg by mouth daily.    [provider]  atorvastatin (LIPITOR) 80 MG tablet Take 1 tablet by mouth daily.    [provider]  clopidogrel (PLAVIX) 75 MG tablet Take 75 mg by mouth daily.    [provider]  colchicine 0.6 MG tablet Take 0.6 mg by mouth daily as needed (gout). 02/24/11    [provider]  ezetimibe (ZETIA) 10 MG tablet Take 10 mg by mouth daily.    [provider]  HUMALOG KWIKPEN 100 UNIT/ML KwikPen Inject 8 Units into the skin 2 (two) times daily before a meal. 10/01/20   [provider]  hydrALAZINE (APRESOLINE) 25 MG tablet Take 1 tablet (25 mg total) by mouth 3 (three) times daily. 08/24/23 08/18/24  Wendall Stade, MD  Insulin Pen Needle (B-D ULTRAFINE III SHORT PEN) 31G X 8 MM MISC use pen needs 4 times a day with insulin  (DX: ICD10: E11.51) 03/02/10   [provider]  Insulin Syringe-Needle U-100 31G X 5/16" 1 ML MISC use one needle once daily 11/23/10   [provider]  LANTUS SOLOSTAR 100 UNIT/ML Solostar Pen Inject 28 Units into the skin 2 (two) times daily. 12/14/16   [provider]  losartan-hydrochlorothiazide (HYZAAR) 100-25 MG per tablet Take 1 tablet by mouth daily. 08/17/14   [provider]  Multiple Vitamins-Minerals (EMERGEN-C IMMUNE PLUS) PACK Take 1 Scoop by mouth daily.    [provider]  nitroGLYCERIN (NITROSTAT) 0.4 MG SL tablet Place 1 tablet (0.4 mg total) under the tongue every 5 (five) minutes as needed for chest pain. Patient taking differently: Place 0.4 mg under the tongue every 5 (five) minutes x 3 doses as needed for chest pain (if no relief after 3rd dose,  proceed to ED or call 911). 06/28/18   Laqueta Linden, MD  omeprazole (PRILOSEC) 20 MG capsule Take 20 mg by mouth daily. 08/24/21   [provider]  Oxymetazoline HCl (VICKS SINEX 12 HOUR NA) Place 1 spray into both nostrils daily as needed (congestion).    [provider]  tamsulosin (FLOMAX) 0.4 MG CAPS capsule Take 0.4 mg by mouth at bedtime.    [provider]      Allergies    Patient has no known allergies.    Review of Systems   Review of Systems  Constitutional:  Positive for fatigue.  All other systems reviewed and are negative.   Physical Exam Updated Vital  Signs BP (!) 150/69   Pulse (!) 54   Temp 98.2 F (36.8 C)   Resp 20   Ht 5\' 9"  (1.753 m)   Wt 121.1 kg   SpO2 95%   BMI 39.43 kg/m  Physical Exam Vitals and nursing note reviewed.  Constitutional:      Appearance: Normal appearance.  HENT:     Head: Normocephalic and atraumatic.     Nose: Nose normal.     Mouth/Throat:     Mouth: Mucous membranes are moist.  Eyes:     Extraocular Movements: Extraocular movements intact.     Conjunctiva/sclera: Conjunctivae normal.     Pupils: Pupils are equal, round, and reactive to light.  Cardiovascular:     Rate and Rhythm: Normal rate and regular rhythm.     Pulses: Normal pulses.     Heart sounds: Normal heart sounds. No murmur heard.    No gallop.  Pulmonary:     Effort: Pulmonary effort is normal. No respiratory distress.     Breath sounds: Normal breath sounds. No stridor. No wheezing, rhonchi or rales.  Abdominal:     General: Abdomen is flat. Bowel sounds are normal. There is no distension.     Palpations: Abdomen is soft.     Tenderness: There is no abdominal tenderness. There is no guarding.  Musculoskeletal:        General: Normal range of motion.     Cervical back: Normal range of motion and neck supple. No rigidity or tenderness.     Right lower leg: No edema.     Left lower leg: No edema.  Skin:    General: Skin is warm and dry.     Findings: No bruising or rash.  Neurological:     General: No focal deficit present.     Mental Status: He is alert and oriented to person, place, and time. Mental status is at baseline.     Cranial Nerves: No cranial nerve deficit.     Sensory: No sensory deficit.     Motor: No weakness.     Gait: Gait normal.     Comments: Abnormal finger-nose on the left, normal heel-to-shin, normal rapid alternating movements  Psychiatric:        Mood and Affect: Mood normal.        Behavior: Behavior normal.        Thought Content: Thought content normal.        Judgment: Judgment normal.      ED Results / Procedures / Treatments   Labs (all labs ordered are listed, but only abnormal results are displayed) Labs Reviewed  COMPREHENSIVE METABOLIC PANEL WITH GFR - Abnormal; Notable for the following components:      Result Value   Potassium 3.4 (*)  Glucose, Bld 156 (*)    AST 13 (*)    All other components within normal limits  CBC WITH DIFFERENTIAL/PLATELET - Abnormal; Notable for the following components:   Hemoglobin 12.3 (*)    MCH 25.0 (*)    RDW 15.9 (*)    Neutro Abs 8.2 (*)    All other components within normal limits  CBG MONITORING, ED - Abnormal; Notable for the following components:   Glucose-Capillary 190 (*)    All other components within normal limits  MAGNESIUM  URINALYSIS, ROUTINE W REFLEX MICROSCOPIC  TROPONIN I (HIGH SENSITIVITY)    EKG None  Radiology DG Chest Port 1 View Result Date: 01/29/2024 CLINICAL DATA:  Dyspnea. EXAM: PORTABLE CHEST 1 VIEW COMPARISON:  05/30/2018. FINDINGS: Low lung volume. Bilateral lung fields are clear. Bilateral costophrenic angles are clear. Mildly enlarged cardio-mediastinal silhouette, which is likely accentuated by low lung volume and AP technique. No acute osseous abnormalities. The soft tissues are within normal limits. IMPRESSION: No active disease. Electronically Signed   By: Jules Schick M.D.   On: 01/29/2024 15:19    Procedures Procedures    Medications Ordered in ED Medications - No data to display  ED Course/ Medical Decision Making/ A&P                                 Medical Decision Making Patient does remain stable at this time.  Patient does have MRI findings consistent with acute infarct within the right side of the cerebellum as well as basal ganglia.  Patient does have abnormal finger-to-nose but no other indications were noted for large vessel occlusion at this point.  No other neurological deficits were noted on physical exam.  Will allow continued permissive hypertension at this  point.  Have reached out to neurologist and am awaiting callback at this time.  Will sign patient out to attending physician Dr. Estell Harpin at this time pending neurologist consult and dispo.  Amount and/or Complexity of Data Reviewed Labs: ordered. Radiology: ordered.  Risk Prescription drug management. Decision regarding hospitalization.           Final Clinical Impression(s) / ED Diagnoses Final diagnoses:  None    Rx / DC Orders ED Discharge Orders     None         Kathlen Mody 01/29/24 1914    Bethann Berkshire, MD 01/30/24 [phone]

## 2024-01-29 NOTE — Assessment & Plan Note (Addendum)
-   This involves cortex greater than white matter in the right frontal, parietal, occipital, and temporal lobes (MCA and PCA territories/watershed regions) as well as punctate acute infarcts which are also present in the right basal ganglia.  This is highly suspicious for embolic etiology given multiple cerebral infarcts. - The patient has subsequent altered mental status with confusion, incoherence, left sided weakness and imbalance. - The patient will be admitted to an observation medically monitored bed.   - We will follow neuro checks q.4 hours for 24 hours.   - The patient will be continued on aspirin  and we will resume Plavix . - Will obtain CTA of the head and neck and 2D echo with bubble study .   - A neurology consultation  as well as physical/occupation/speech therapy consults will be obtained in a.m.Aaron Aas - I notified Dr. Merceda Stairs about the patient. -- PT/OT and speech therapy consults will be obtaines given his acute CVA. - The patient will be placed on statin therapy and fasting lipids will be checked.

## 2024-01-30 ENCOUNTER — Observation Stay (HOSPITAL_BASED_OUTPATIENT_CLINIC_OR_DEPARTMENT_OTHER)

## 2024-01-30 ENCOUNTER — Other Ambulatory Visit (HOSPITAL_COMMUNITY): Payer: Self-pay

## 2024-01-30 ENCOUNTER — Telehealth (HOSPITAL_COMMUNITY): Payer: Self-pay | Admitting: Pharmacy Technician

## 2024-01-30 DIAGNOSIS — I639 Cerebral infarction, unspecified: Secondary | ICD-10-CM | POA: Diagnosis not present

## 2024-01-30 DIAGNOSIS — I63541 Cerebral infarction due to unspecified occlusion or stenosis of right cerebellar artery: Secondary | ICD-10-CM | POA: Diagnosis not present

## 2024-01-30 DIAGNOSIS — I10 Essential (primary) hypertension: Secondary | ICD-10-CM

## 2024-01-30 DIAGNOSIS — K219 Gastro-esophageal reflux disease without esophagitis: Secondary | ICD-10-CM | POA: Diagnosis not present

## 2024-01-30 DIAGNOSIS — E66812 Obesity, class 2: Secondary | ICD-10-CM

## 2024-01-30 DIAGNOSIS — N400 Enlarged prostate without lower urinary tract symptoms: Secondary | ICD-10-CM

## 2024-01-30 DIAGNOSIS — E785 Hyperlipidemia, unspecified: Secondary | ICD-10-CM | POA: Diagnosis not present

## 2024-01-30 LAB — ECHOCARDIOGRAM COMPLETE BUBBLE STUDY
AR max vel: 2.8 cm2
AV Area VTI: 2.72 cm2
AV Area mean vel: 2.86 cm2
AV Mean grad: 6 mmHg
AV Peak grad: 13.8 mmHg
Ao pk vel: 1.86 m/s
Area-P 1/2: 2.34 cm2
MV VTI: 2.47 cm2
S' Lateral: 2.7 cm

## 2024-01-30 LAB — LIPID PANEL
Cholesterol: 89 mg/dL (ref 0–200)
HDL: 30 mg/dL — ABNORMAL LOW (ref >40)
LDL Cholesterol: 48 mg/dL (ref 0–99)
Total CHOL/HDL Ratio: 3 ratio
Triglycerides: 57 mg/dL (ref <150)
VLDL: 11 mg/dL (ref 0–40)

## 2024-01-30 LAB — HEMOGLOBIN A1C
Hgb A1c MFr Bld: 6.1 % — ABNORMAL HIGH (ref 4.8–5.6)
Mean Plasma Glucose: 128.37 mg/dL

## 2024-01-30 LAB — GLUCOSE, CAPILLARY: Glucose-Capillary: 196 mg/dL — ABNORMAL HIGH (ref 70–99)

## 2024-01-30 MED ORDER — PERFLUTREN LIPID MICROSPHERE
1.0000 mL | INTRAVENOUS | Status: AC | PRN
  Administered 2024-01-30: 2 mL via INTRAVENOUS

## 2024-01-30 MED ORDER — TICAGRELOR 90 MG PO TABS: 90.0000 mg | ORAL_TABLET | Freq: Two times a day (BID) | ORAL | Status: DC

## 2024-01-30 MED ORDER — LORATADINE 10 MG PO TABS
10.0000 mg | ORAL_TABLET | Freq: Every day | ORAL | Status: DC
  Administered 2024-01-30: 10 mg via ORAL
  Filled 2024-01-30: qty 1

## 2024-01-30 MED ORDER — TICAGRELOR 90 MG PO TABS: 90.0000 mg | ORAL_TABLET | Freq: Two times a day (BID) | ORAL | 0 refills | Status: DC

## 2024-01-30 MED ORDER — SALINE SPRAY 0.65 % NA SOLN
1.0000 | NASAL | Status: DC | PRN
  Administered 2024-01-30: 1 via NASAL
  Filled 2024-01-30 (×3): qty 44

## 2024-01-30 MED ORDER — INSULIN ASPART 100 UNIT/ML IJ SOLN: 0.0000 [IU] | Freq: Every day | INTRAMUSCULAR | Status: DC

## 2024-01-30 MED ORDER — LORATADINE 10 MG PO TABS: 10.0000 mg | ORAL_TABLET | Freq: Every day | ORAL | 1 refills | Status: AC

## 2024-01-30 MED ORDER — INSULIN ASPART 100 UNIT/ML IJ SOLN
0.0000 [IU] | Freq: Three times a day (TID) | INTRAMUSCULAR | Status: DC
  Administered 2024-01-30: 2 [IU] via SUBCUTANEOUS

## 2024-01-30 MED ORDER — SUMATRIPTAN SUCCINATE 50 MG PO TABS
50.0000 mg | ORAL_TABLET | Freq: Once | ORAL | Status: DC
  Filled 2024-01-30: qty 1

## 2024-01-30 MED ORDER — INSULIN GLARGINE-YFGN 100 UNIT/ML ~~LOC~~ SOLN
8.0000 [IU] | Freq: Every day | SUBCUTANEOUS | Status: DC
Start: 2024-01-30 — End: 2024-01-30
  Filled 2024-01-30: qty 0.08

## 2024-01-30 NOTE — TOC CM/SW Note (Signed)
 Transition of Care [facility]) - Inpatient Brief Assessment   Patient Details  Name: Jacob Avery MRN: [id number] Date of Birth: 25-Apr-1946  Transition of Care [facility]) CM/SW Contact:    Villa Herb, LCSWA Phone Number: 01/30/2024, 10:00 AM   Clinical Narrative: Transition of Care Department [facility]) has reviewed patient and no TOC needs have been identified at this time. We will continue to monitor patient advancement through interdiciplinary progression rounds. If new patient transition needs arise, please place a TOC consult.   Transition of Care Asessment: Insurance and Status: Insurance coverage has been reviewed Patient has primary care physician: Yes Home environment has been reviewed: From home Prior level of function:: Independent Prior/Current Home Services: No current home services Social Drivers of Health Review: SDOH reviewed no interventions necessary Readmission risk has been reviewed: Yes Transition of care needs: no transition of care needs at this time

## 2024-01-30 NOTE — Consult Note (Signed)
 I connected with  Jacob Avery on 01/30/24 by a video enabled telemedicine application and verified that I am speaking with the correct person using two identifiers.   I discussed the limitations of evaluation and management by telemedicine. The patient expressed understanding and agreed to proceed.  Location of patient: [facility] Location of physician: [facility]  Neurology Consultation Reason for Consult: Stroke Referring Physician: Dr. Pilar Grammes  CC: Generalized weakness, confusion  History is obtained from: Patient, chart review  HPI: Jacob Avery is a 78 y.o. male with past medical history of hypertension, hyperlipidemia, diabetes, obstructive sleep apnea, peripheral vascular disease, morbid obesity, prior CVA who presented with altered mental status.  On arrival patient's daughter reported blood glucose of 42 which was 64 after having some orange juice.  She also noticed that patient was weaker on the left side and dropping things from his left hand as well as little off balance.  MRI brain was done which showed acute ischemic stroke in right cerebrum.  Therefore neurology was consulted.  Last known normal: Unclear, about 3 days ago Event happened at home No tPA as outside window No thrombectomy as no large vessel occlusion mRS 1   ROS: All other systems reviewed and negative except as noted in the HPI.   Past Medical History:  Diagnosis Date   Arthritis    Chronic kidney disease    Stage 2   CVA (cerebral vascular accident) (HCC) 10/2014    Late effect of cerebrovascular disease.  Mostly resolved, but can see difference in facial expressions.  tp-A, Plavix afterward   Diabetes mellitus without complication (HCC)    type II, controlled, with renal comps   Diabetic foot (HCC) 04/05/2018   Normal sensation, skin intact   Diverticulosis    ED (erectile dysfunction)    GI bleed 04/2017   Gout    History of claustrophobia    with MRI   Hx of adenomatous  polyp of rectum 06/10/2017   Hypercholesterolemia    Hypertension    Long term (current) use of insulin (HCC)    Remains on basal/bolus therapy without hypoglycemia.   Microalbuminuria 2013   Morbid obesity (HCC)    Obesity    OSA (obstructive sleep apnea)    Intolerant to CPAP   Peripheral vascular disease (HCC)    Right calf pain 04/05/2018   Vertigo     Family History  Problem Relation Age of Onset   CVA Mother    Alzheimer's disease Mother    Heart attack Father    Heart disease Father    Diabetes Brother    Rectal cancer Brother    Stomach cancer Neg Hx    Colon cancer Neg Hx     Social History:  reports that he quit smoking about 27 years ago. His smoking use included cigarettes. He started smoking about 42 years ago. He has a 15 pack-year smoking history. He has never used smokeless tobacco. He reports that he does not drink alcohol and does not use drugs.   Medications Prior to Admission  Medication Sig Dispense Refill Last Dose/Taking   Accu-Chek FastClix Lancets MISC    01/29/2024 Morning   ACCU-CHEK GUIDE test strip USE STRIP TO CHECK GLUCOSE THREE TIMES DAILY   01/29/2024 Morning   acetaminophen (TYLENOL) 650 MG CR tablet Take 1,300 mg by mouth every 8 (eight) hours as needed for pain.   01/28/2024 Bedtime   amLODipine (NORVASC) 10 MG tablet Take 10 mg by mouth  daily.   Past Week   aspirin EC 81 MG tablet Take 81 mg by mouth daily.   01/28/2024 Morning   atorvastatin (LIPITOR) 80 MG tablet Take 1 tablet by mouth daily.   Past Week   clopidogrel (PLAVIX) 75 MG tablet Take 75 mg by mouth daily.   Past Week   ezetimibe (ZETIA) 10 MG tablet Take 10 mg by mouth daily.   Past Week   HUMALOG KWIKPEN 100 UNIT/ML KwikPen Inject 8 Units into the skin 2 (two) times daily before a meal.   Past Week   hydrALAZINE (APRESOLINE) 25 MG tablet Take 1 tablet (25 mg total) by mouth 3 (three) times daily. 270 tablet 3 Past Week   Insulin Pen Needle (B-D ULTRAFINE III SHORT PEN) 31G X 8 MM  MISC use pen needs 4 times a day with insulin  (DX: ICD10: E11.51)   Past Week   Insulin Syringe-Needle U-100 31G X 5/16" 1 ML MISC use one needle once daily   Past Week   LANTUS SOLOSTAR 100 UNIT/ML Solostar Pen Inject 28 Units into the skin 2 (two) times daily.   Past Week   losartan-hydrochlorothiazide (HYZAAR) 100-25 MG per tablet Take 1 tablet by mouth daily.   Past Week   omeprazole (PRILOSEC) 20 MG capsule Take 20 mg by mouth daily.   Past Week   Oxymetazoline HCl (VICKS SINEX 12 HOUR NA) Place 1 spray into both nostrils daily as needed (congestion).   01/29/2024 Bedtime   tamsulosin (FLOMAX) 0.4 MG CAPS capsule Take 0.4 mg by mouth at bedtime.   Past Week   colchicine 0.6 MG tablet Take 0.6 mg by mouth daily as needed (gout).   Unknown   Multiple Vitamins-Minerals (EMERGEN-C IMMUNE PLUS) PACK Take 1 Scoop by mouth daily.   More than a month   nitroGLYCERIN (NITROSTAT) 0.4 MG SL tablet Place 1 tablet (0.4 mg total) under the tongue every 5 (five) minutes as needed for chest pain. (Patient taking differently: Place 0.4 mg under the tongue every 5 (five) minutes x 3 doses as needed for chest pain (if no relief after 3rd dose, proceed to ED or call 911).) 25 tablet 3 Unknown      Exam: Current vital signs: BP (!) 155/82 (BP Location: Left Arm)   Pulse (!) 59   Temp 98.2 F (36.8 C) (Oral)   Resp 20   Ht 5\' 9"  (1.753 m)   Wt 118.3 kg   SpO2 96%   BMI 38.51 kg/m  Vital signs in last 24 hours: Temp:  [98.1 F (36.7 C)-98.5 F (36.9 C)] 98.2 F (36.8 C) (03/31 0454) Pulse Rate:  [50-67] 59 (03/31 0933) Resp:  [16-25] 20 (03/31 0454) BP: (124-205)/(37-82) 155/82 (03/31 0933) SpO2:  [92 %-98 %] 96 % (03/31 0933) Weight:  [118.3 kg-121.1 kg] 118.3 kg (03/30 2113)   Physical Exam  Constitutional: Appears well-developed and well-nourished.  Psych: Affect appropriate to situation Neuro: AOx3, no GI, cranial nerves grossly intact, antigravity strength in all 4 extremities with subtle  drift in left upper and left lower extremity, FTN intact bilaterally, sensory intact to light touch  NIHSS 2  INPUTS: 1A: Level of consciousness --> 0 = Alert; keenly responsive 1B: Ask month and age --> 0 = Both questions right 1C: 'Blink eyes' & 'squeeze hands' --> 0 = Performs both tasks 2: Horizontal extraocular movements --> 0 = Normal 3: Visual fields --> 0 = No visual loss 4: Facial palsy --> 0 = Normal symmetry 5A: Left  arm motor drift --> 1 = Drift, but doesn't hit bed 5B: Right arm motor drift --> 0 = No drift for 10 seconds 6A: Left leg motor drift --> 1 = Drift, but doesn't hit bed 6B: Right leg motor drift --> 0 = No drift for 5 seconds 7: Limb Ataxia --> 0 = No ataxia 8: Sensation --> 0 = Normal; no sensory loss 9: Language/aphasia --> 0 = Normal; no aphasia 10: Dysarthria --> 0 = Normal 11: Extinction/inattention --> 0 = No abnormality    I have reviewed labs in epic and the results pertinent to this consultation are: CBC:  Recent Labs  Lab 01/29/24 1503  WBC 9.5  NEUTROABS 8.2*  HGB 12.3*  HCT 40.1  MCV 81.5  PLT 343    Basic Metabolic Panel:  Lab Results  Component Value Date   NA 139 01/29/2024   K 3.4 (L) 01/29/2024   CO2 26 01/29/2024   GLUCOSE 156 (H) 01/29/2024   BUN 19 01/29/2024   CREATININE 1.06 01/29/2024   CALCIUM 9.0 01/29/2024   GFRNONAA >60 01/29/2024   GFRAA >60 05/31/2018   Lipid Panel:  Lab Results  Component Value Date   LDLCALC 48 01/30/2024   HgbA1c:  Lab Results  Component Value Date   HGBA1C 7.6 (H) 05/26/2018   Urine Drug Screen: No results found for: "LABOPIA", "COCAINSCRNUR", "LABBENZ", "AMPHETMU", "THCU", "LABBARB"  Alcohol Level     Component Value Date/Time   ETH <5 10/25/2014 1311     I have reviewed the images obtained:  CT angio Head and Neck with contrast 01/30/2024: pproximately 80% stenosis of the right ICA at the C2 level. Severe left and moderate right intradural vertebral artery stenosis.   Approximately 50% stenosis of the proximal left ICA. Suspected bilateral vertebral artery origins/V1 segment stenosis,but evaluation is limited due to artifact.  Aortic Atherosclerosis  MRI Brain wo contrast 01/29/2024:  There are numerous small acute infarcts involving cortex greater than white matter in the right frontal, parietal, occipital,and temporal lobes (MCA and PCA territories/watershed regions). Punctate acute infarcts are also present in the right basal ganglia. There are small chronic right frontal and parietal cortical infarcts with a small amount of associated hemosiderin. T2 hyperintensities elsewhere in the cerebral white matter bilaterally and in the pons are nonspecific but compatible with mild-to-moderate chronic small vessel ischemic disease. There are chronic lacunar infarcts in the right basal ganglia, left thalamus, and right cerebellar hemisphere. There is mild-to-moderate cerebral atrophy.     ASSESSMENT/PLAN: 78 year old male with prior stroke who presented with worsening mentation and weakness.  Acute ischemic stroke Right carotid symptomatic stenosis -Etiology: Likely atheroembolic versus hypoperfusion from right carotid artery   Recommendations: -Patient states he was already taking aspirin 81 mg daily and Plavix 75 mg daily.  Therefore would recommend aspirin 81 mg daily and Brilinta 90 mg daily for 30 days followed aspirin 81 mg daily -Continue atorvastatin 80 mg daily -Recommend referral to vascular surgery for symptomatic right carotid stenosis -TTE ordered and pending.  If negative recommend cardiac monitor to look for paroxysmal A-fib -Goal blood pressure: Normotension.  Please avoid hypotension as patient has significant carotid stenosis and can cause recrudescence or worsening of symptoms -PT/OT - stroke education -Discussed plan in detail with patient's daughter at bedside as well as Dr. Gwenlyn Perking secure chat -Follow-up with neurology in 3 months (order  placed)   Thank you for allowing [place] to participate in the care of this patient. If you have any further questions, please  contact  me or neurohospitalist.   Lindie Spruce Epile[facility] neurohospitalist

## 2024-01-30 NOTE — Telephone Encounter (Signed)
 Patient Product/process development scientist completed.    The patient is insured through [facility] ([facility] Patient has Medicare and is not eligible for a copay card, but may be able to apply for patient assistance or Medicare RX Payment Plan (Patient Must reach out to their plan, if eligible for payment plan), if available.    Ran test claim for Brilinta 90 mg and the current 30 day co-pay is $387.00 due to a $340.00 deductible.  Will be $47.00 once deductible is met.   This test claim was processed through [facility]- copay amounts may vary at other pharmacies due to pharmacy/plan contracts, or as the patient moves through the different stages of their insurance plan.     Roland Earl, CPHT Pharmacy Technician III Certified Patient Advocate [facility] Pharmacy Patient Advocate Team Direct Number: [phone]  Fax: [phone]

## 2024-01-30 NOTE — Evaluation (Signed)
 Physical Therapy Evaluation Patient Details Name: Jacob Avery MRN: [id number] DOB: 04/10/1946 Today's Date: 01/30/2024  History of Present Illness  Jacob Avery is a 78 y.o. African-American male with medical history significant for CVA, type 2 diabetes mellitus, essential hypertension, dyslipidemia, peripheral vascular disease and diverticulosis, who presented to the emergency room with acute onset of altered mental status with confusion and incoherence that started yesterday.  The patient's daughter stated that his blood glucose was 42 earlier today and after having an orange juice it came up to 64.  The patient was noted to be dropping things from his left hand today and has been off balance.  He felt weak in the left upper extremity.  No significant tingling or numbness.  No dysphagia or dysarthria.  He has been having mild cough and dyspnea with wheezing mainly on exertion however not significantly more than his usual baseline.  No fever or chills.  He denied any urinary or stool incontinence.  No tinnitus or vertigo.  No witnessed seizures.  He has been taking aspirin daily.  It is unclear if he has been taking Plavix regularly. (per MD)    Clinical Impression  PT arrives for assessment; OT present.  Patient agreeable to therapy assessment.  Patient performs supine to sit modified independent taking extra time to perform. He demonstrates good sitting balance on the edge of the bed with feet supported.  Noted weakness left side lower extremity versus right grossly 4/5.  Patient performs step pivot transfer to the chair with CGA with noted decreased speed but no loss of balance. Needs cues to reach back for the chair for safety before sitting down.  Sit to stand from the chair using arms to push up to standing with CGA.  Patient then ambulates x 100 ft with CGA; decreased gait speed noted and noted increased limp and lateral trunk sway as he fatigues.  Would benefit from a RW for energy conservation  and balance.  Patient returns to the room and sits in the chair all with CGA.  Rates his fatigue at 8/10 after walking.  patient left in chair with chair alarm set with call button in reach and nursing present.  Patient will benefit from continued skilled therapy services during the remainder of his hospital stay and at the next recommended venue of care to address deficits and promote return to optimal function.             If plan is discharge home, recommend the following: A little help with walking and/or transfers;A little help with bathing/dressing/bathroom;Assist for transportation;Assistance with cooking/housework   Can travel by private Tax inspector (2 wheels)  Recommendations for Other Services       Functional Status Assessment Patient has had a recent decline in their functional status and demonstrates the ability to make significant improvements in function in a reasonable and predictable amount of time.     Precautions / Restrictions Precautions Precautions: Fall Recall of Precautions/Restrictions: Intact Restrictions Weight Bearing Restrictions Per Provider Order: No      Mobility  Bed Mobility Overal bed mobility: Modified Independent             General bed mobility comments: takes extra time Patient Response: Cooperative  Transfers Overall transfer level: Needs assistance Equipment used: None Transfers: Sit to/from Stand, Bed to chair/wheelchair/BSC Sit to Stand: Supervision, Contact guard assist   Step pivot transfers: Contact guard assist, Supervision  General transfer comment: takes extra time    Ambulation/Gait Ambulation/Gait assistance: Contact guard assist Gait Distance (Feet): 100 Feet Assistive device: None Gait Pattern/deviations: Decreased step length - left, Decreased stance time - left Gait velocity: decreased     General Gait Details: increased limp and lateral trunk sway as he  fatigues; Modified RPE is 8/10 after walking; would benefit from RW to improve energy conservation and balance  Stairs            Wheelchair Mobility     Tilt Bed Tilt Bed Patient Response: Cooperative  Modified Rankin (Stroke Patients Only)       Balance Overall balance assessment: Needs assistance Sitting-balance support: No upper extremity supported, Feet supported Sitting balance-Leahy Scale: Good Sitting balance - Comments: good sitting balance; noted posterior trunk lean with lower extermity manual muscle testing   Standing balance support: No upper extremity supported, During functional activity Standing balance-Leahy Scale: Fair Standing balance comment: fair standing balance noted; increased trunk sway with ambulation as he fatigues with walking                             Pertinent Vitals/Pain Pain Assessment Pain Assessment: No/denies pain    Home Living Family/patient expects to be discharged to:: Private residence Living Arrangements: Spouse/significant other Available Help at Discharge: Family;Available 24 hours/day Type of Home: House Home Access: Stairs to enter Entrance Stairs-Rails: Right;Left;Can reach both Entrance Stairs-Number of Steps: 3   Home Layout: Able to live on main level with bedroom/bathroom;Laundry or work area in [employer]: None      Prior Function Prior Level of Function : Independent/Modified Independent             Mobility Comments: Tourist information centre manager without AD; does use scooter in grocery store due to SOB and leg weakness with longer distances. ADLs Comments: Independent ADL; assist IADL     Extremity/Trunk Assessment   Upper Extremity Assessment Upper Extremity Assessment: Defer to OT evaluation LUE Deficits / Details: 3-/5 shoulder flexion; 4-/5 shoulder abduction; 4+/5 elbow flexion and extension; wrist extension 4/5; wrist flexion 4+/5; gross grasp/ 4/5. LUE Sensation: WNL LUE  Coordination: decreased fine motor;decreased gross motor    Lower Extremity Assessment Lower Extremity Assessment: LLE deficits/detail LLE Deficits / Details: decreased left hip flexion, knee extension and dorsiflexion strength grossly 4/5 LLE Coordination: decreased gross motor    Cervical / Trunk Assessment Cervical / Trunk Assessment: Normal  Communication   Communication Communication: No apparent difficulties    Cognition Arousal: Alert Behavior During Therapy: WFL for tasks assessed/performed                             Following commands: Intact       Cueing Cueing Techniques: Verbal cues     General Comments      Exercises     Assessment/Plan    PT Assessment Patient needs continued PT services  PT Problem List Decreased strength;Decreased activity tolerance;Decreased balance;Decreased mobility       PT Treatment Interventions DME instruction;Gait training;Functional mobility training;Therapeutic activities;Therapeutic exercise;Balance training;Patient/family education    PT Goals (Current goals can be found in the Care Plan section)  Acute Rehab PT Goals Patient Stated Goal: return home PT Goal Formulation: With patient Time For Goal Achievement: 02/13/24 Potential to Achieve Goals: Good    Frequency Min 2X/week     Co-evaluation PT/OT/SLP Co-Evaluation/Treatment: Yes Reason for  Co-Treatment: To address functional/ADL transfers PT goals addressed during session: Mobility/safety with mobility;Balance OT goals addressed during session: ADL's and self-care       AM-PAC PT "6 Clicks" Mobility  Outcome Measure Help needed turning from your back to your side while in a flat bed without using bedrails?: None Help needed moving from lying on your back to sitting on the side of a flat bed without using bedrails?: None Help needed moving to and from a bed to a chair (including a wheelchair)?: A Little Help needed standing up from a chair  using your arms (e.g., wheelchair or bedside chair)?: A Little Help needed to walk in hospital room?: A Little Help needed climbing 3-5 steps with a railing? : A Lot 6 Click Score: 19    End of Session Equipment Utilized During Treatment: Gait belt Activity Tolerance: Patient tolerated treatment well Patient left: in chair;with call bell/phone within reach;with chair alarm set;with nursing/sitter in room Nurse Communication: Mobility status PT Visit Diagnosis: Other abnormalities of gait and mobility (R26.89);Muscle weakness (generalized) (M62.81);Difficulty in walking, not elsewhere classified (R26.2)    Time: 9562-1308 PT Time Calculation (min) (ACUTE ONLY): 19 min   Charges:   PT Evaluation $PT Eval Moderate Complexity: 1 Mod   PT General Charges $$ ACUTE PT VISIT: 1 Visit         11:07 AM, 01/30/24 Jacob Avery MPT [place] [facility] [place] [phone] Ph:[phone]

## 2024-01-30 NOTE — Progress Notes (Signed)
 Pt discharged via WC to POV with all belongings in his family member 's possession.

## 2024-01-30 NOTE — Care Management Obs Status (Signed)
 MEDICARE OBSERVATION STATUS NOTIFICATION   Patient Details  Name: Jacob Avery MRN: [id number] Date of Birth: Jul 25, 1946   Medicare Observation Status Notification Given:  Yes    Corey Harold 01/30/2024, 3:51 PM

## 2024-01-30 NOTE — TOC Initial Note (Signed)
 Transition of Care [facility]) - Initial/Assessment Note    Patient Details  Name: Jacob Avery MRN: [id number] Date of Birth: October 07, 1946  Transition of Care [facility]) CM/SW Contact:    Villa Herb, LCSWA Phone Number: 01/30/2024, 11:48 AM  Clinical Narrative:                 CSW updated that PT is recommending HH for pt. CSW spoke with pt and daughter via speaker phone. CSW spoke about HH vs OP PT, they prefer OP PT at the [facility] [place] [facility]. CSW to make referral for OP PT. CSW inquired about need for rolling walker to be ordered. They are requesting the rolling walker be ordered and delivered to the room. CSW spoke to [place] with [employer] who will deliver to pts room. TOC to follow.   Expected Discharge Plan: OP Rehab Barriers to Discharge: Continued Medical Work up   Patient Goals and CMS Choice Patient states their goals for this hospitalization and ongoing recovery are:: return home CMS Medicare.gov Compare Post Acute Care list provided to:: Patient Choice offered to / list presented to : Patient      Expected Discharge Plan and Services In-house Referral: Clinical Social Work Discharge Planning Services: CM Consult   Living arrangements for the past 2 months: Single Family Home                 DME Arranged: Walker rolling DME Agency: [employer] Date DME Agency Contacted: 01/30/24   Representative spoke with at DME Agency: Ian Malkin            Prior Living Arrangements/Services Living arrangements for the past 2 months: Single Family Home Lives with:: Spouse   Do you feel safe going back to the place where you live?: Yes      Need for Family Participation in Patient Care: Yes (Comment) Care giver support system in place?: Yes (comment)   Criminal Activity/Legal Involvement Pertinent to Current Situation/Hospitalization: No - Comment as needed  Activities of Daily Living   ADL Screening (condition at time of admission) Independently performs ADLs?: Yes (appropriate for  developmental age) Is the patient deaf or have difficulty hearing?: No Does the patient have difficulty seeing, even when wearing glasses/contacts?: No Does the patient have difficulty concentrating, remembering, or making decisions?: No  Permission Sought/Granted                  Emotional Assessment Appearance:: Appears stated age Attitude/Demeanor/Rapport: Engaged Affect (typically observed): Accepting Orientation: : Oriented to Self, Oriented to Place, Oriented to  Time, Oriented to Situation Alcohol / Substance Use: Not Applicable Psych Involvement: No (comment)  Admission diagnosis:  Hypoglycemia [E16.2] Acute CVA (cerebrovascular accident) [facility]) [I63.9] Cerebrovascular accident (CVA), unspecified mechanism (HCC) [I63.9] Patient Active Problem List   Diagnosis Date Noted   Acute CVA (cerebrovascular accident) (HCC) 01/29/2024   Dyslipidemia 01/29/2024   Type 2 diabetes mellitus without complications (HCC) 01/29/2024   BPH (benign prostatic hyperplasia) 01/29/2024   GERD without esophagitis 01/29/2024   Annual physical exam 01/13/2021   Benign prostatic hyperplasia with lower urinary tract symptoms 12/19/2018   Hypertensive heart and renal disease 12/19/2018   PAD (peripheral artery disease) (HCC) 05/30/2018   Critical lower limb ischemia (HCC) 04/10/2018   Chronic kidney disease, stage 2 (mild) 12/14/2017   Hx of adenomatous polyp of rectum 06/10/2017   Long term (current) use of insulin (HCC) 08/19/2015   Sequelae of cerebrovascular disease 08/19/2015   HLD (hyperlipidemia) 01/07/2015   Obstructive sleep apnea 01/07/2015  Sleep apnea 12/11/2014   Snoring 12/11/2014   Severe obesity (BMI >= 40) (HCC) 12/11/2014   Embolic stroke involving right carotid artery (HCC) 12/11/2014   Palpitations 10/29/2014   Carotid stenosis    Essential hypertension 10/28/2014   Hyperlipidemia LDL goal <70 10/28/2014   Morbid obesity (HCC) 10/28/2014   Diabetes (HCC) 10/28/2014    likely OSA (obstructive sleep apnea) 10/28/2014   Cerebral infarction due to occlusion or stenosis of precerebral artery (HCC) 10/25/2014   Proteinuria 04/03/2012   Gout 06/14/2011   Hyperglycemia due to type 2 diabetes mellitus (HCC) 06/03/2010   OTHER SPECIFIED INTESTINAL OBSTRUCTION 12/22/2009   DIVERTICULOSIS-COLON 12/22/2009   Diabetic renal disease (HCC) 05/25/2009   Male erectile disorder 05/25/2009   Pure hypercholesterolemia 05/25/2009   PCP:  Gaspar Garbe, MD Pharmacy:   [facility] [address], [place] - [address] [place] [address] Phone: [phone] Fax: [phone]  [facility]) - [place], [place] - [address] [place] [address] [place] [address] Phone: [phone] Fax: [phone]     Social Drivers of Health (SDOH) Social History: SDOH Screenings   Food Insecurity: No Food Insecurity (01/29/2024)  Housing: Low Risk  (01/29/2024)  Transportation Needs: No Transportation Needs (01/29/2024)  Utilities: Not At Risk (01/29/2024)  Social Connections: Socially Integrated (01/29/2024)  Tobacco Use: Medium Risk (01/29/2024)   SDOH Interventions:     Readmission Risk Interventions     No data to display

## 2024-01-30 NOTE — Plan of Care (Signed)
  Problem: Acute Rehab OT Goals (only OT should resolve) Goal: Pt. Will Perform Grooming Flowsheets (Taken 01/30/2024 1118) Pt Will Perform Grooming: Independently Goal: Pt. Will Perform Upper Body Dressing Flowsheets (Taken 01/30/2024 1118) Pt Will Perform Upper Body Dressing: Independently Goal: Pt. Will Perform Lower Body Dressing Flowsheets (Taken 01/30/2024 1118) Pt Will Perform Lower Body Dressing: Independently Goal: Pt. Will Transfer To Toilet Flowsheets (Taken 01/30/2024 1118) Pt Will Transfer to Toilet: Independently Goal: Pt. Will Perform Toileting-Clothing Manipulation Flowsheets (Taken 01/30/2024 1118) Pt Will Perform Toileting - Clothing Manipulation and hygiene: Independently Goal: Pt/Caregiver Will Perform Home Exercise Program Flowsheets (Taken 01/30/2024 1118) Pt/caregiver will Perform Home Exercise Program:  Increased ROM  Increased strength  Left upper extremity  Independently  Yunus Stoklosa OT, MOT

## 2024-01-30 NOTE — Evaluation (Signed)
 Occupational Therapy Evaluation Patient Details Name: Jacob Avery MRN: [id number] DOB: [birth year] Today's Date: 01/30/2024   History of Present Illness   Jacob Avery is a 78 y.o. African-American male with medical history significant for CVA, type 2 diabetes mellitus, essential hypertension, dyslipidemia, peripheral vascular disease and diverticulosis, who presented to the emergency room with acute onset of altered mental status with confusion and incoherence that started yesterday.  The patient's daughter stated that his blood glucose was 42 earlier today and after having an orange juice it came up to 64.  The patient was noted to be dropping things from his left hand today and has been off balance.  He felt weak in the left upper extremity.  No significant tingling or numbness.  No dysphagia or dysarthria.  He has been having mild cough and dyspnea with wheezing mainly on exertion however not significantly more than his usual baseline.  No fever or chills.  He denied any urinary or stool incontinence.  No tinnitus or vertigo.  No witnessed seizures.  He has been taking aspirin daily.  It is unclear if he has been taking Plavix regularly. (per MD)     Clinical Impressions Pt agreeable to OT and PT co-evaluation. Pt presents with L UE weakness, decreased dexterity, and decreased gross motor coordination. Pt also is presenting with L visual field deficit that was most noted in the L lower quadrant. CGA needed for mobility due to balance issues. Pt left in the chair with call bell within reach and chair alarm set. Pt will benefit from continued OT in the hospital and recommended venue below to increase strength, balance, and endurance for safe ADL's.        If plan is discharge home, recommend the following:   A little help with walking and/or transfers;A little help with bathing/dressing/bathroom;Assistance with cooking/housework;Assist for transportation;Help with stairs or ramp for  entrance     Functional Status Assessment   Patient has had a recent decline in their functional status and demonstrates the ability to make significant improvements in function in a reasonable and predictable amount of time.     Equipment Recommendations   None recommended by OT     Recommendations for Other Services   Other (comment) (Evaluation from neuro vision specialist)     Precautions/Restrictions   Precautions Precautions: Fall Recall of Precautions/Restrictions: Intact Restrictions Weight Bearing Restrictions Per Provider Order: No     Mobility Bed Mobility Overal bed mobility: Modified Independent             General bed mobility comments: extended time    Transfers Overall transfer level: Needs assistance Equipment used: None Transfers: Sit to/from Stand, Bed to chair/wheelchair/BSC Sit to Stand: Contact Avery assist     Step pivot transfers: Contact Avery assist     General transfer comment: mild extended time      Balance Overall balance assessment: Needs assistance Sitting-balance support: No upper extremity supported, Feet supported Sitting balance-Leahy Scale: Good Sitting balance - Comments: good seated at EOB   Standing balance support: No upper extremity supported, During functional activity Standing balance-Leahy Scale: Fair Standing balance comment: without AD                           ADL either performed or assessed with clinical judgement   ADL Overall ADL's : Needs assistance/impaired Eating/Feeding: Sitting;Set up   Grooming: Standing;Contact Avery assist   Upper Body Bathing: Set up;Sitting   Lower Body  Bathing: Set up;Sitting/lateral leans   Upper Body Dressing : Set up;Sitting   Lower Body Dressing: Set up;Sitting/lateral leans Lower Body Dressing Details (indicate cue type and reason): Pt able to don sock seated at EOB Toilet Transfer: Contact Avery assist;Ambulation;Stand-pivot Toilet  Transfer Details (indicate cue type and reason): Simulated via EOB to chair and ambulation in the hall. Toileting- Clothing Manipulation and Hygiene: Set up;Sitting/lateral lean       Functional mobility during ADLs: Contact Avery assist General ADL Comments: Able to ambulate over 100 feet in the hall without AD.     Vision Baseline Vision/History: 1 Wears glasses Ability to See in Adequate Light: 1 Impaired Patient Visual Report: No change from baseline Vision Assessment?: Yes Alignment/Gaze Preference: Within Defined Limits Tracking/Visual Pursuits: Other (comment) (decreased smoothness of tracking intermittently; slow to follow simulus.) Convergence: Impaired (comment) (No convergence from R eye. L eye only.) Visual Fields: Left visual field deficit (L lower quadrant field cut. Mild upper quadrant as well.)     Perception Perception: Not tested       Praxis Praxis: Impaired Praxis Impairment Details: Motor planning Praxis-Other Comments: Difficulty reaching and grasping for a bottle.   Pertinent Vitals/Pain Pain Assessment Pain Assessment: No/denies pain     Extremity/Trunk Assessment Upper Extremity Assessment Upper Extremity Assessment: Defer to OT evaluation LUE Deficits / Details: 3-/5 shoulder flexion; 4-/5 shoulder abduction; 4+/5 elbow flexion and extension; wrist extension 4/5; wrist flexion 4+/5; gross grasp/ 4/5. LUE Sensation: WNL LUE Coordination: decreased fine motor;decreased gross motor   Lower Extremity Assessment Lower Extremity Assessment: Defer to PT evaluation LLE Deficits / Details: decreased left hip flexion, knee extension and dorsiflexion strength grossly 4/5 LLE Coordination: decreased gross motor   Cervical / Trunk Assessment Cervical / Trunk Assessment: Normal   Communication Communication Communication: No apparent difficulties   Cognition Arousal: Alert Behavior During Therapy: WFL for tasks assessed/performed Cognition: No apparent  impairments                               Following commands: Intact       Cueing  General Comments   Cueing Techniques: Verbal cues                 Home Living Family/patient expects to be discharged to:: Private residence Living Arrangements: Spouse/significant other Available Help at Discharge: Family;Available 24 hours/day Type of Home: House Home Access: Stairs to enter [employer] of Steps: 3 Entrance Stairs-Rails: Right;Left;Can reach both Home Layout: Able to live on main level with bedroom/bathroom;Laundry or work area in basement     [employer] Shower/Tub: Chief Strategy Officer: Handicapped height Bathroom Accessibility: Yes How Accessible: Accessible via wheelchair;Accessible via walker Home Equipment: None          Prior Functioning/Environment Prior Level of Function : Independent/Modified Independent             Mobility Comments: Tourist information centre manager without AD; does use scooter in grocery store due to SOB and leg weakness with longer distances. ADLs Comments: Independent ADL; assist IADL    OT Problem List: Decreased strength;Decreased range of motion;Decreased activity tolerance;Impaired balance (sitting and/or standing)   OT Treatment/Interventions: Self-care/ADL training;Therapeutic exercise;Neuromuscular education;Therapeutic activities;Patient/family education;Balance training;DME and/or AE instruction;Visual/perceptual remediation/compensation      OT Goals(Current goals can be found in the care plan section)   Acute Rehab OT Goals Patient Stated Goal: return home OT Goal Formulation: With patient Time For Goal Achievement:  02/13/24 Potential to Achieve Goals: Good   OT Frequency:  Min 2X/week    Co-evaluation PT/OT/SLP Co-Evaluation/Treatment: Yes Reason for Co-Treatment: To address functional/ADL transfers PT goals addressed during session: Mobility/safety with mobility;Balance OT goals  addressed during session: ADL's and self-care      AM-PAC OT "6 Clicks" Daily Activity     Outcome Measure Help from another person eating meals?: A Little Help from another person taking care of personal grooming?: A Little Help from another person toileting, which includes using toliet, bedpan, or urinal?: A Little Help from another person bathing (including washing, rinsing, drying)?: A Little Help from another person to put on and taking off regular upper body clothing?: A Little Help from another person to put on and taking off regular lower body clothing?: A Little 6 Click Score: 18   End of Session Equipment Utilized During Treatment: Gait belt Nurse Communication: Mobility status (observed session)  Activity Tolerance: Patient tolerated treatment well Patient left: in chair;with call bell/phone within reach;with chair alarm set  OT Visit Diagnosis: Unsteadiness on feet (R26.81);Other abnormalities of gait and mobility (R26.89);Muscle weakness (generalized) (M62.81);Other symptoms and signs involving the nervous system (Q65.784)                Time: 6962-9528 OT Time Calculation (min): 25 min Charges:  OT General Charges $OT Visit: 1 Visit OT Evaluation $OT Eval Low Complexity: 1 Low  Hale Chalfin OT, MOT  Danie Chandler 01/30/2024, 11:16 AM

## 2024-01-30 NOTE — Plan of Care (Signed)
   Problem: Activity: Goal: Risk for activity intolerance will decrease Outcome: Progressing   Problem: Coping: Goal: Level of anxiety will decrease Outcome: Progressing

## 2024-01-30 NOTE — Progress Notes (Signed)
 A&O x3, passed nurse swallow screen without issue. Has ambulated in hallway with PT & OT, tolerated well. Only lingering issues are left arm/leg weakness and peripheral visual deficit in left eye from 3 o'clock to 6 o'clock positions. MD Gwenlyn Perking updated on current pt status, orders received. Pt up in recliner with chair alarm on, call bell within reach. Advised to call for needs. Wife at bedside.

## 2024-01-30 NOTE — Plan of Care (Signed)
  Problem: Acute Rehab PT Goals(only PT should resolve) Goal: Pt Will Go Supine/Side To Sit Outcome: Progressing Flowsheets (Taken 01/30/2024 1108) Pt will go Supine/Side to Sit: Independently Goal: Patient Will Transfer Sit To/From Stand Outcome: Progressing Flowsheets (Taken 01/30/2024 1108) Patient will transfer sit to/from stand: with supervision Goal: Pt Will Transfer Bed To Chair/Chair To Bed Outcome: Progressing Flowsheets (Taken 01/30/2024 1108) Pt will Transfer Bed to Chair/Chair to Bed: with supervision Goal: Pt Will Ambulate Outcome: Progressing Flowsheets (Taken 01/30/2024 1108) Pt will Ambulate:  > 125 feet  with rolling walker  with supervision   Problem: Acute Rehab PT Goals(only PT should resolve) Goal: Pt Will Go Supine/Side To Sit Outcome: Progressing Flowsheets (Taken 01/30/2024 1108) Pt will go Supine/Side to Sit: Independently Goal: Patient Will Transfer Sit To/From Stand Outcome: Progressing Flowsheets (Taken 01/30/2024 1108) Patient will transfer sit to/from stand: with supervision Goal: Pt Will Transfer Bed To Chair/Chair To Bed Outcome: Progressing Flowsheets (Taken 01/30/2024 1108) Pt will Transfer Bed to Chair/Chair to Bed: with supervision Goal: Pt Will Ambulate Outcome: Progressing Flowsheets (Taken 01/30/2024 1108) Pt will Ambulate:  > 125 feet  with rolling walker  with supervision

## 2024-01-30 NOTE — Discharge Summary (Signed)
 Physician Discharge Summary   Patient: Jacob Avery MRN: [id number] DOB: August 09, 1946  Admit date:     01/29/2024  Discharge date: 01/30/24  Discharge Physician: Vassie Loll   PCP: Gaspar Garbe, MD   Recommendations at discharge:  Repeat basic metabolic panel to follow ultralights renal function Repeat CBC to follow hemoglobin trend/stability Should patient follow-up with vascular surgery and neurology as instructed Continue to follow CBG fluctuation/A1c levels and further adjust hypoglycemic regimen as required.  Discharge Diagnoses: Principal Problem:   Acute CVA (cerebrovascular accident) [facility] Active Problems:   Essential hypertension   Dyslipidemia   Type 2 diabetes mellitus without complications (HCC)   BPH (benign prostatic hyperplasia)   GERD without esophagitis   Class 2 obesity Chronic diastolic heart failure  Brief Hospital admission narrative: As per H&P written by Dr.Mansy Reichen Hutzler is a 78 y.o. African-American male with medical history significant for CVA, type 2 diabetes mellitus, essential hypertension, dyslipidemia, peripheral vascular disease and diverticulosis, who presented to the emergency room with acute onset of altered mental status with confusion and incoherence that started yesterday.  The patient's daughter stated that his blood glucose was 42 earlier today and after having an orange juice it came up to 64.  The patient was noted to be dropping things from his left hand today and has been off balance.  He felt weak in the left upper extremity.  No significant tingling or numbness.  No dysphagia or dysarthria.  He has been having mild cough and dyspnea with wheezing mainly on exertion however not significantly more than his usual baseline.  No fever or chills.  He denied any urinary or stool incontinence.  No tinnitus or vertigo.  No witnessed seizures.  He has been taking aspirin daily.  It is unclear if he has been taking Plavix regularly.   ED  Course: When the patient came to the ER, BP was 150/69 with a heart rate of 54 otherwise normal vital signs.  Labs revealed mild hypokalemia 3.4 and a blood glucose of 156 with otherwise normal vital signs.  High-sensitivity troponin I was 11 twice.  CBC was unremarkable.  UA showed 30 protein and was otherwise negative. EKG as reviewed by me : EKG showed mild sinus bradycardia with a rate of 52. Imaging: Portable chest x-ray showed no acute cardiopulmonary disease. Brain MRI without contrast revealed the following: 1. Numerous small acute right cerebral infarcts involving cortex greater than white matter in the right frontal, parietal, occipital, and temporal lobes (MCA and PCA territories/watershed regions). Punctate acute infarcts are also present in the right basal ganglia.  Assessment and Plan: * Acute CVA (cerebrovascular accident) (HCC) - This involves cortex greater than white matter in the right frontal, parietal, occipital, and temporal lobes (MCA and PCA territories/watershed regions) as well as punctate acute infarcts which are also present in the right basal ganglia.  This is highly suspicious for embolic etiology given multiple cerebral infarcts. - The patient has subsequent altered mental status with confusion, incoherence, left sided weakness and imbalance. -Patient was out of therapeutic window for tPA -After completing acute workup no thrombi appreciated on his 2D echo; CTA demonstrating positive right-sided carotid plaque.  Patient will need follow-up with vascular surgery. -After discussing with neurology service recommendation given for 30 days of Brilinta and aspirin -Continue Refacto modifications regarding blood pressure control, statin and diabetes management. -Stable mentation at time of discharge.  Essential hypertension -Heart healthy/low-sodium diet discussed with patient -Continue home antihypertensive agents.  Dyslipidemia -Continue treatment  with statin and  Zetia -Heart healthy diet discussed with patient.  Type 2 diabetes mellitus without complications (HCC) -Resume hypoglycemic regimen -Continue to follow CBGs fluctuation and further adjust medication as needed  GERD without esophagitis -Continue PPI.  BPH (benign prostatic hyperplasia) -Continue Flomax.  Consultants: Neurology service. Procedures performed: See below for x-ray reports. Disposition: Home Diet recommendation: Heart healthy, low-fat modified carbohydrate diet.  DISCHARGE MEDICATION: Allergies as of 01/30/2024   No Known Allergies      Medication List     STOP taking these medications    clopidogrel 75 MG tablet Commonly known as: PLAVIX   VICKS SINEX 12 HOUR NA       TAKE these medications    Accu-Chek FastClix Lancets Misc   Accu-Chek Guide test strip Generic drug: glucose blood USE STRIP TO CHECK GLUCOSE THREE TIMES DAILY   acetaminophen 650 MG CR tablet Commonly known as: TYLENOL Take 1,300 mg by mouth every 8 (eight) hours as needed for pain.   amLODipine 10 MG tablet Commonly known as: NORVASC Take 10 mg by mouth daily.   aspirin EC 81 MG tablet Take 81 mg by mouth daily.   atorvastatin 80 MG tablet Commonly known as: LIPITOR Take 1 tablet by mouth daily.   B-D ULTRAFINE III SHORT PEN 31G X 8 MM Misc Generic drug: Insulin Pen Needle use pen needs 4 times a day with insulin  (DX: ICD10: E11.51)   colchicine 0.6 MG tablet Take 0.6 mg by mouth daily as needed (gout).   Emergen-C Immune Plus Pack Take 1 Scoop by mouth daily.   ezetimibe 10 MG tablet Commonly known as: ZETIA Take 10 mg by mouth daily.   HumaLOG KwikPen 100 UNIT/ML KwikPen Generic drug: insulin lispro Inject 8 Units into the skin 2 (two) times daily before a meal.   hydrALAZINE 25 MG tablet Commonly known as: APRESOLINE Take 1 tablet (25 mg total) by mouth 3 (three) times daily.   Insulin Syringe-Needle U-100 31G X 5/16" 1 ML Misc use one needle once  daily   Lantus SoloStar 100 UNIT/ML Solostar Pen Generic drug: insulin glargine Inject 28 Units into the skin 2 (two) times daily.   loratadine 10 MG tablet Commonly known as: CLARITIN Take 1 tablet (10 mg total) by mouth daily. Start taking on: January 31, 2024   losartan-hydrochlorothiazide 100-25 MG tablet Commonly known as: HYZAAR Take 1 tablet by mouth daily.   metoprolol tartrate 25 MG tablet Commonly known as: LOPRESSOR Take 25 mg by mouth 2 (two) times daily.   nitroGLYCERIN 0.4 MG SL tablet Commonly known as: NITROSTAT Place 1 tablet (0.4 mg total) under the tongue every 5 (five) minutes as needed for chest pain.   omeprazole 20 MG capsule Commonly known as: PRILOSEC Take 20 mg by mouth daily.   tamsulosin 0.4 MG Caps capsule Commonly known as: FLOMAX Take 0.4 mg by mouth at bedtime.   ticagrelor 90 MG Tabs tablet Commonly known as: BRILINTA Take 1 tablet (90 mg total) by mouth 2 (two) times daily.               Durable Medical Equipment  (From admission, onward)           Start     Ordered   01/30/24 1114  For home use only DME Walker rolling  Once       Question Answer Comment  Walker: With 5 Inch Wheels   Patient needs a walker to treat with the following condition Abnormality of  gait and mobility   Patient needs a walker to treat with the following condition Weakness generalized      01/30/24 1113            Follow-up Information     [name], Adelfa Koh, MD. Schedule an appointment as soon as possible for a visit in 10 day(s).   Specialty: Internal Medicine Contact information: [address] [place] [address] [phone]                Discharge Exam: Ceasar Mons Weights   01/29/24 1438 01/29/24 2113  Weight: 121.1 kg 118.3 kg   General exam: Alert, awake, oriented x 3; following commands and in no acute distress.  Still demonstrating some left-sided weakness Respiratory system: Clear to auscultation. Respiratory effort  normal.  Good saturation on room air. Cardiovascular system:RRR. No rubs or gallops; no JVD. Gastrointestinal system: Abdomen is obese, nondistended, soft and nontender. No organomegaly or masses felt. Normal bowel sounds heard. Central nervous system: Left-sided weakness reported; no dysphagia, no dysarthria, moving 4 limbs spontaneously.   Extremities: No cyanosis or clubbing. Skin: No petechiae. Psychiatry: Mood & affect appropriate.    Condition at discharge: Stable.  The results of significant diagnostics from this hospitalization (including imaging, microbiology, ancillary and laboratory) are listed below for reference.   Imaging Studies: ECHOCARDIOGRAM COMPLETE BUBBLE STUDY Result Date: 01/30/2024    ECHOCARDIOGRAM REPORT   Patient Name:   ZADOK HOLAWAY Date of Exam: 01/30/2024 Medical Rec #:  [id number]    Height:       69.0 in Accession #:    [id number]   Weight:       260.8 lb Date of Birth:  May 03, 1946    BSA:          2.313 m Patient Age:    77 years     BP:           151/50 mmHg Patient Gender: M            HR:           55 bpm. [facility] Procedure: 2D Echo, Color Doppler, Cardiac Doppler, Saline Contrast Bubble Study            and Intracardiac Opacification Agent (Both Spectral and Color Flow            Doppler were utilized during procedure). Indications:    Stroke  History:        Patient has prior history of Echocardiogram examinations. Risk                 Factors:Hypertension, Dyslipidemia, Diabetes and Former Smoker.  Sonographer:    Lamont Snowball Referring Phys: [id number] JAN A MANSY  Sonographer Comments: Patient is obese. Image acquisition challenging due to patient body habitus. IMPRESSIONS  1. Left ventricular ejection fraction, by estimation, is 70 to 75%. The left ventricle has hyperdynamic function. The left ventricle has no regional wall motion abnormalities. Left ventricular diastolic parameters are consistent with Grade I diastolic dysfunction (impaired  relaxation).  2. Right ventricular systolic function is normal. The right ventricular size is moderately enlarged. There is normal pulmonary artery systolic pressure.  3. Left atrial size was mildly dilated.  4. The mitral valve is normal in structure. No evidence of mitral valve regurgitation. No evidence of mitral stenosis.  5. The aortic valve is tricuspid. There is mild calcification of the aortic valve. Aortic valve regurgitation is not visualized. No aortic stenosis is present.  6. The inferior  vena cava is normal in size with greater than 50% respiratory variability, suggesting right atrial pressure of 3 mmHg.  7. Agitated saline contrast bubble study was negative, with no evidence of any interatrial shunt at rest and inconclusive with Valsalva due to suboptimal; image.  8. Increased flow velocities may be secondary to anemia, thyrotoxicosis, hyperdynamic or high flow state. FINDINGS  Left Ventricle: Left ventricular ejection fraction, by estimation, is 70 to 75%. The left ventricle has hyperdynamic function. The left ventricle has no regional wall motion abnormalities. Definity contrast agent was given IV to delineate the left ventricular endocardial borders. Strain was performed and the global longitudinal strain is indeterminate. The left ventricular internal cavity size was normal in size. There is no left ventricular hypertrophy. Left ventricular diastolic parameters are consistent with Grade I diastolic dysfunction (impaired relaxation). Normal left ventricular filling pressure. Right Ventricle: The right ventricular size is moderately enlarged. No increase in right ventricular wall thickness. Right ventricular systolic function is normal. There is normal pulmonary artery systolic pressure. The tricuspid regurgitant velocity is 2.37 m/s, and with an assumed right atrial pressure of 3 mmHg, the estimated right ventricular systolic pressure is 25.5 mmHg. Left Atrium: Left atrial size was mildly dilated.  Right Atrium: Right atrial size was normal in size. Pericardium: There is no evidence of pericardial effusion. Mitral Valve: The mitral valve is normal in structure. No evidence of mitral valve regurgitation. No evidence of mitral valve stenosis. MV peak gradient, 6.2 mmHg. The mean mitral valve gradient is 1.0 mmHg. Tricuspid Valve: The tricuspid valve is grossly normal. Tricuspid valve regurgitation is trivial. No evidence of tricuspid stenosis. Aortic Valve: The aortic valve is tricuspid. There is mild calcification of the aortic valve. Aortic valve regurgitation is not visualized. No aortic stenosis is present. Aortic valve mean gradient measures 6.0 mmHg. Aortic valve peak gradient measures 13.8 mmHg. Aortic valve area, by VTI measures 2.72 cm. Pulmonic Valve: The pulmonic valve was not well visualized. Pulmonic valve regurgitation is not visualized. No evidence of pulmonic stenosis. Aorta: The aortic root and ascending aorta are structurally normal, with no evidence of dilitation. Venous: The inferior vena cava is normal in size with greater than 50% respiratory variability, suggesting right atrial pressure of 3 mmHg. IAS/Shunts: The atrial septum is grossly normal. Agitated saline contrast was given intravenously to evaluate for intracardiac shunting. Agitated saline contrast bubble study was negative, with no evidence of any interatrial shunt. Additional Comments: 3D was performed not requiring image post processing on an independent workstation and was indeterminate.  LEFT VENTRICLE PLAX 2D LVIDd:         5.00 cm   Diastology LVIDs:         2.70 cm   LV e' medial:    6.85 cm/s LV PW:         1.00 cm   LV E/e' medial:  10.8 LV IVS:        1.20 cm   LV e' lateral:   8.49 cm/s LVOT diam:     2.00 cm   LV E/e' lateral: 8.7 LV SV:         97 LV SV Index:   42 LVOT Area:     3.14 cm  RIGHT VENTRICLE             IVC RV Basal diam:  5.40 cm     IVC diam: 1.10 cm RV S prime:     22.70 cm/s TAPSE (M-mode): 2.5 cm  LEFT ATRIUM  Index        RIGHT ATRIUM           Index LA diam:        4.90 cm 2.12 cm/m   RA Area:     22.20 cm LA Vol (A2C):   75.6 ml 32.69 ml/m  RA Volume:   61.40 ml  26.55 ml/m LA Vol (A4C):   82.7 ml 35.76 ml/m LA Biplane Vol: 80.4 ml 34.76 ml/m  AORTIC VALVE AV Area (Vmax):    2.80 cm AV Area (Vmean):   2.86 cm AV Area (VTI):     2.72 cm AV Vmax:           186.00 cm/s AV Vmean:          113.000 cm/s AV VTI:            0.359 m AV Peak Grad:      13.8 mmHg AV Mean Grad:      6.0 mmHg LVOT Vmax:         166.00 cm/s LVOT Vmean:        103.000 cm/s LVOT VTI:          0.310 m LVOT/AV VTI ratio: 0.86  AORTA Ao Root diam: 2.80 cm Ao Asc diam:  3.40 cm MITRAL VALVE                TRICUSPID VALVE MV Area (PHT): 2.34 cm     TR Peak grad:   22.5 mmHg MV Area VTI:   2.47 cm     TR Vmax:        237.00 cm/s MV Peak grad:  6.2 mmHg MV Mean grad:  1.0 mmHg     SHUNTS MV Vmax:       1.25 m/s     Systemic VTI:  0.31 m MV Vmean:      52.7 cm/s    Systemic Diam: 2.00 cm MV Decel Time: 324 msec MV E velocity: 73.70 cm/s MV A velocity: 126.00 cm/s MV E/A ratio:  0.58 Vishnu Priya Mallipeddi Electronically signed by Winfield Rast Mallipeddi Signature Date/Time: 01/30/2024/4:05:03 PM    Final    CT ANGIO HEAD NECK W WO CM Result Date: 01/30/2024 CLINICAL DATA:  Stroke/TIA, determine embolic source EXAM: CT ANGIOGRAPHY HEAD AND NECK WITH AND WITHOUT CONTRAST TECHNIQUE: Multidetector CT imaging of the head and neck was performed using the standard protocol during bolus administration of intravenous contrast. Multiplanar CT image reconstructions and MIPs were obtained to evaluate the vascular anatomy. Carotid stenosis measurements (when applicable) are obtained utilizing NASCET criteria, using the distal internal carotid diameter as the denominator. RADIATION DOSE REDUCTION: This exam was performed according to the departmental dose-optimization program which includes automated exposure control, adjustment of the  mA and/or kV according to patient size and/or use of iterative reconstruction technique. CONTRAST:  75mL OMNIPAQUE IOHEXOL 350 MG/ML SOLN COMPARISON:  MRI head from earlier today.  CTA head/neck 10/27/2014. FINDINGS: CTA NECK FINDINGS Aortic arch: Aortic atherosclerosis. Great vessel origins are patent. Right carotid system: Ulcerated atherosclerosis at the carotid bifurcation and involving the ICA and multiple sites with up to 80% stenosis of the ICA at the C2 level. Left carotid system: Atherosclerosis at the carotid bifurcation and involving the proximal ICA with up to 50% stenosis of the proximal ICA. Vertebral arteries: Suspected bilateral vertebral artery origins/V1 segment stenosis, but evaluation is limited due to artifact from patient body habitus and motion. The vertebral arteries are diminutive but patent. Skeleton: No acute abnormality  on limited assessment. Other neck: No evidence of acute abnormality on limited assessment. Upper chest: Visualized lung apices are clear. Review of the MIP images confirms the above findings CTA HEAD FINDINGS Anterior circulation: Calcified atherosclerosis of the intracranial ICAs without hemodynamically significant stenosis. Hypoplastic right A1 ACA. Otherwise, bilateral MCAs and ACAs are patent without proximal hemodynamically significant stenosis. Multifocal mild atherosclerotic irregularity. Posterior circulation: Severe proximal left intradural vertebral artery stenosis. Right intradural vertebral artery is patent with moderate stenosis. Patent right PICA. Basilar artery and bilateral posterior cerebral arteries are patent without proximal hemodynamically significant stenosis. Venous sinuses: As permitted by contrast timing, patent. Review of the MIP images confirms the above findings IMPRESSION: 1. Approximately 80% stenosis of the right ICA at the C2 level. 2. Severe left and moderate right intradural vertebral artery stenosis. 3. Approximately 50% stenosis of the  proximal left ICA. 4. Suspected bilateral vertebral artery origins/V1 segment stenosis, but evaluation is limited due to artifact. 5.  Aortic Atherosclerosis (ICD10-I70.0). Electronically Signed   By: Feliberto Harts M.D.   On: 01/30/2024 01:20   MR BRAIN WO CONTRAST Result Date: 01/29/2024 CLINICAL DATA:  Delirium. EXAM: MRI HEAD WITHOUT CONTRAST TECHNIQUE: Multiplanar, multiecho pulse sequences of the brain and surrounding structures were obtained without intravenous contrast. COMPARISON:  Head MRI 10/26/2014 FINDINGS: Brain: There are numerous small acute infarcts involving cortex greater than white matter in the right frontal, parietal, occipital, and temporal lobes (MCA and PCA territories/watershed regions). Punctate acute infarcts are also present in the right basal ganglia. There are no acute infarcts in the left cerebral hemisphere or posterior fossa. There are small chronic right frontal and parietal cortical infarcts with a small amount of associated hemosiderin. T2 hyperintensities elsewhere in the cerebral white matter bilaterally and in the pons are nonspecific but compatible with mild-to-moderate chronic small vessel ischemic disease. There are chronic lacunar infarcts in the right basal ganglia, left thalamus, and right cerebellar hemisphere. There is mild-to-moderate cerebral atrophy. Vascular: Major intracranial vascular flow voids are preserved. Skull and upper cervical spine: Unremarkable bone marrow signal. Sinuses/Orbits: Bilateral cataract extraction. Small mucous retention cyst in the right maxillary sinus. Clear mastoid air cells. Other: None. IMPRESSION: 1. Numerous small acute right cerebral infarcts. 2. Mild-to-moderate chronic small vessel ischemic disease. Electronically Signed   By: Sebastian Ache M.D.   On: 01/29/2024 18:25   DG Chest Port 1 View Result Date: 01/29/2024 CLINICAL DATA:  Dyspnea. EXAM: PORTABLE CHEST 1 VIEW COMPARISON:  05/30/2018. FINDINGS: Low lung volume.  Bilateral lung fields are clear. Bilateral costophrenic angles are clear. Mildly enlarged cardio-mediastinal silhouette, which is likely accentuated by low lung volume and AP technique. No acute osseous abnormalities. The soft tissues are within normal limits. IMPRESSION: No active disease. Electronically Signed   By: Jules Schick M.D.   On: 01/29/2024 15:19    Microbiology: Results for orders placed or performed during the hospital encounter of 05/26/18  Surgical pcr screen     Status: None   Collection Time: 05/26/18  9:18 AM   Specimen: Nasal Mucosa; Nasal Swab  Result Value Ref Range Status   MRSA, PCR NEGATIVE NEGATIVE Final   Staphylococcus aureus NEGATIVE NEGATIVE Final    Comment: (NOTE) The Xpert SA Assay (FDA approved for NASAL specimens in patients 19 years of age and older), is one component of a comprehensive surveillance program. It is not intended to diagnose infection nor to guide or monitor treatment. Performed at [facility], [address] [place], [place] [address]  Labs: CBC: Recent Labs  Lab 01/29/24 1503  WBC 9.5  NEUTROABS 8.2*  HGB 12.3*  HCT 40.1  MCV 81.5  PLT 343   Basic Metabolic Panel: Recent Labs  Lab 01/29/24 1503  NA 139  K 3.4*  CL 104  CO2 26  GLUCOSE 156*  BUN 19  CREATININE 1.06  CALCIUM 9.0  MG 1.8   Liver Function Tests: Recent Labs  Lab 01/29/24 1503  AST 13*  ALT 12  ALKPHOS 72  BILITOT 0.6  PROT 7.0  ALBUMIN 3.6   CBG: Recent Labs  Lab 01/29/24 1439 01/29/24 1639 01/30/24 1624  GLUCAP 190* 112* 196*    Discharge time spent: greater than 30 minutes.  Signed: Vassie Loll, MD [facility] 01/30/2024

## 2024-01-30 NOTE — Evaluation (Signed)
 Speech Language Pathology Evaluation Patient Details Name: Gus Littler MRN: [id number] DOB: 1946-08-19 Today's Date: 01/30/2024 Time: 8295-6213 SLP Time Calculation (min) (ACUTE ONLY): 30 min  Problem List:  Patient Active Problem List   Diagnosis Date Noted   Acute CVA (cerebrovascular accident) (HCC) 01/29/2024   Dyslipidemia 01/29/2024   Type 2 diabetes mellitus without complications (HCC) 01/29/2024   BPH (benign prostatic hyperplasia) 01/29/2024   GERD without esophagitis 01/29/2024   Annual physical exam 01/13/2021   Benign prostatic hyperplasia with lower urinary tract symptoms 12/19/2018   Hypertensive heart and renal disease 12/19/2018   PAD (peripheral artery disease) (HCC) 05/30/2018   Critical lower limb ischemia (HCC) 04/10/2018   Chronic kidney disease, stage 2 (mild) 12/14/2017   Hx of adenomatous polyp of rectum 06/10/2017   Long term (current) use of insulin (HCC) 08/19/2015   Sequelae of cerebrovascular disease 08/19/2015   HLD (hyperlipidemia) 01/07/2015   Obstructive sleep apnea 01/07/2015   Sleep apnea 12/11/2014   Snoring 12/11/2014   Severe obesity (BMI >= 40) (HCC) 12/11/2014   Embolic stroke involving right carotid artery (HCC) 12/11/2014   Palpitations 10/29/2014   Carotid stenosis    Essential hypertension 10/28/2014   Hyperlipidemia LDL goal <70 10/28/2014   Morbid obesity (HCC) 10/28/2014   Diabetes (HCC) 10/28/2014   likely OSA (obstructive sleep apnea) 10/28/2014   Cerebral infarction due to occlusion or stenosis of precerebral artery (HCC) 10/25/2014   Proteinuria 04/03/2012   Gout 06/14/2011   Hyperglycemia due to type 2 diabetes mellitus (HCC) 06/03/2010   OTHER SPECIFIED INTESTINAL OBSTRUCTION 12/22/2009   DIVERTICULOSIS-COLON 12/22/2009   Diabetic renal disease (HCC) 05/25/2009   Male erectile disorder 05/25/2009   Pure hypercholesterolemia 05/25/2009   Past Medical History:  Past Medical History:  Diagnosis Date   Arthritis     Chronic kidney disease    Stage 2   CVA (cerebral vascular accident) (HCC) 10/2014    Late effect of cerebrovascular disease.  Mostly resolved, but can see difference in facial expressions.  tp-A, Plavix afterward   Diabetes mellitus without complication (HCC)    type II, controlled, with renal comps   Diabetic foot (HCC) 04/05/2018   Normal sensation, skin intact   Diverticulosis    ED (erectile dysfunction)    GI bleed 04/2017   Gout    History of claustrophobia    with MRI   Hx of adenomatous polyp of rectum 06/10/2017   Hypercholesterolemia    Hypertension    Long term (current) use of insulin (HCC)    Remains on basal/bolus therapy without hypoglycemia.   Microalbuminuria 2013   Morbid obesity (HCC)    Obesity    OSA (obstructive sleep apnea)    Intolerant to CPAP   Peripheral vascular disease (HCC)    Right calf pain 04/05/2018   Vertigo    Past Surgical History:  Past Surgical History:  Procedure Laterality Date   ABDOMINAL AORTOGRAM N/A 04/13/2018   Procedure: ABDOMINAL AORTOGRAM;  Surgeon: Fransisco Hertz, MD;  Location: [facility] INVASIVE CV LAB;  Service: Cardiovascular;  Laterality: N/A;   APPLICATION OF WOUND VAC Bilateral 05/30/2018   Procedure: APPLICATION OF WOUND VAC;  Surgeon: Fransisco Hertz, MD;  Location: [facility] OR;  Service: Vascular;  Laterality: Bilateral;   AXILLARY-FEMORAL BYPASS GRAFT Left 05/30/2018   Procedure: BYPASS GRAFT LEFT AXILLA-BIFEMORAL WITH HEMO SHIELD GOLD VASCULAR GRAFTS;  Surgeon: Fransisco Hertz, MD;  Location: [facility] OR;  Service: Vascular;  Laterality: Left;   CATARACT EXTRACTION W/PHACO Right 07/03/2021  Procedure: CATARACT EXTRACTION PHACO AND INTRAOCULAR LENS PLACEMENT (IOC);  Surgeon: Fabio Pierce, MD;  Location: [facility];  Service: Ophthalmology;  Laterality: Right;  CDE 6.99   CATARACT EXTRACTION W/PHACO Left 07/17/2021   Procedure: CATARACT EXTRACTION PHACO AND INTRAOCULAR LENS PLACEMENT (IOC);  Surgeon: Fabio Pierce, MD;  Location: [facility] ORS;  Service:  Ophthalmology;  Laterality: Left;  CDE 6.92   COLONOSCOPY WITH PROPOFOL N/A 06/08/2017   Procedure: COLONOSCOPY WITH PROPOFOL ( Ultra-Slim Scope);  Surgeon: Iva Boop, MD;  Location: [facility];  Service: Endoscopy;  Laterality: N/A;   ENDARTERECTOMY FEMORAL Bilateral 05/30/2018   Procedure: ENDARTERECTOMY FEMORAL LEFT;  Surgeon: Fransisco Hertz, MD;  Location: [facility] OR;  Service: Vascular;  Laterality: Bilateral;   INTRAOPERATIVE ARTERIOGRAM Bilateral 05/30/2018   Procedure: INTRA OPERATIVE ARTERIOGRAM BILATERAL LOWER EXTREMITIES;  Surgeon: Fransisco Hertz, MD;  Location: [facility] OR;  Service: Vascular;  Laterality: Bilateral;   LOWER EXTREMITY ANGIOGRAPHY N/A 04/13/2018   Procedure: LOWER EXTREMITY ANGIOGRAPHY;  Surgeon: Fransisco Hertz, MD;  Location: [facility] INVASIVE CV LAB;  Service: Cardiovascular;  Laterality: N/A;  bilateral    UMBILICAL HERNIA REPAIR     HPI:  Markeese Boyajian is a 78 y.o. African-American male with medical history significant for CVA, type 2 diabetes mellitus, essential hypertension, dyslipidemia, peripheral vascular disease and diverticulosis, who presented to the emergency room with acute onset of altered mental status with confusion and incoherence that started yesterday.  The patient's daughter stated that his blood glucose was 42 earlier today and after having an orange juice it came up to 64.  The patient was noted to be dropping things from his left hand today and has been off balance.  He felt weak in the left upper extremity.  No significant tingling or numbness.  No dysphagia or dysarthria.  He has been having mild cough and dyspnea with wheezing mainly on exertion however not significantly more than his usual baseline. He has been taking aspirin daily.  It is unclear if he has been taking Plavix regularly. (per MD) MRI-1. Numerous small acute right cerebral infarcts. SLE requested   Assessment / Plan / Recommendation Clinical Impression  Speech language evaluation completed while  Pt was sitting upright in bed. Pt was administered the [facility] SLUMS Examination and achieved a score of 19/30 indicating mild/mod cognitive impairment. Pt's daughter present for evaluation reports Pt's cognition is completely intact at baseline. Patient is responsible for paying all bills and he still drives. Areas of need include memory, thought organization, problem solving and executive funcitoning. No aphasia or word finding difficulties. Pt is aware of changes in cognitive functioning and is stimulable for cueing and strategies. Recommend OP ST for a more in depth cognitive assessment and speech therapy as indicated for cognition. See detailed results of [facility] SLUMS below. There are no further ST needs noted acutely. Our service will sign off. Thank you,   [facility] SLUMS Examination Orientation  3/3  Numeric Problem Solving  1/3  Memory  4/5  Attention 1/2  Thought Organization 3/3  Clock Drawing 3/4  Visuospatial Skills               2/2  Short Story Recall  2/8  Total  19/30     Scoring  High School Education  Less than High School Education   Normal  27-30 25-30  Mild Neurocognitive Disorder 21-26 20-24  Dementia  1-20 1-19      SLP Assessment  SLP Recommendation/Assessment: All further Speech Lanaguage Pathology  needs  can be addressed in the next venue of care SLP Visit Diagnosis: Cognitive communication deficit (R41.841)    Recommendations for follow up therapy are one component of a multi-disciplinary discharge planning process, led by the attending physician.  Recommendations may be updated based on patient status, additional functional criteria and insurance authorization.    Follow Up Recommendations  Outpatient SLP    Assistance Recommended at Discharge  Intermittent Supervision/Assistance  Functional Status Assessment    Frequency and Duration           SLP Evaluation Cognition  Overall Cognitive Status: Impaired/Different from baseline Arousal/Alertness:  Awake/alert Orientation Level: Oriented X4 Year: 2025 Month: March Day of Week: Correct Attention: Focused Memory: Impaired Memory Impairment: Decreased short term memory Decreased Short Term Memory: Verbal basic Awareness: Appears intact Problem Solving: Impaired Problem Solving Impairment: Verbal basic Executive Function: Reasoning Reasoning: Appears intact       Comprehension  Auditory Comprehension Overall Auditory Comprehension: Appears within functional limits for tasks assessed Visual Recognition/Discrimination Discrimination: Not tested Reading Comprehension Reading Status: Not tested    Expression Expression Primary Mode of Expression: Verbal Verbal Expression Overall Verbal Expression: Appears within functional limits for tasks assessed Pragmatics: No impairment Written Expression Dominant Hand: Right   Oral / Motor  Oral Motor/Sensory Function Overall Oral Motor/Sensory Function: Within functional limits Motor Speech Overall Motor Speech: Appears within functional limits for tasks assessed           Matteo Banke H. Romie Levee, CCC-SLP Speech Language Pathologist  Georgetta Haber 01/30/2024, 12:20 PM

## 2024-01-31 ENCOUNTER — Other Ambulatory Visit: Payer: Self-pay | Admitting: *Deleted

## 2024-01-31 DIAGNOSIS — I6529 Occlusion and stenosis of unspecified carotid artery: Secondary | ICD-10-CM

## 2024-02-01 ENCOUNTER — Other Ambulatory Visit: Payer: Self-pay

## 2024-02-01 ENCOUNTER — Encounter: Payer: Self-pay | Admitting: Vascular Surgery

## 2024-02-01 ENCOUNTER — Telehealth: Payer: Self-pay

## 2024-02-01 ENCOUNTER — Ambulatory Visit (HOSPITAL_COMMUNITY)
Admission: RE | Admit: 2024-02-01 | Discharge: 2024-02-01 | Disposition: A | Discharge status: Home / Self Care | Source: Ambulatory Visit | Attending: Vascular Surgery | Admitting: Vascular Surgery

## 2024-02-01 ENCOUNTER — Ambulatory Visit (INDEPENDENT_AMBULATORY_CARE_PROVIDER_SITE_OTHER): Admitting: Vascular Surgery

## 2024-02-01 VITALS — BP 164/80 | HR 51 | Temp 97.9°F | Resp 20 | Ht 69.0 in | Wt 256.0 lb

## 2024-02-01 DIAGNOSIS — I6529 Occlusion and stenosis of unspecified carotid artery: Secondary | ICD-10-CM | POA: Diagnosis present

## 2024-02-01 DIAGNOSIS — I6521 Occlusion and stenosis of right carotid artery: Secondary | ICD-10-CM

## 2024-02-01 DIAGNOSIS — I639 Cerebral infarction, unspecified: Secondary | ICD-10-CM

## 2024-02-01 NOTE — Telephone Encounter (Signed)
-----   Message from [facility] sent at 01/30/2024  5:37 PM EDT ----- Please arrange 30 days cardiac monitoring for Jacob Avery given presentation of acute watershed stroke.  Thanks

## 2024-02-01 NOTE — H&P (View-Only) (Signed)
 Patient ID: Jacob Avery, male   DOB: October 18, 1946, 78 y.o.   MRN: [id number]  Reason for Consult: Follow-up   Referred by [name], Adelfa Koh, MD  Subjective:     HPI:  Jacob Avery is a 78 y.o. male recently evaluated at [facility] for right-sided stroke with left hand weakness and loss of peripheral vision in the left eye.  He also has a previous stroke in 2015 that left him with left leg weakness.  He is on aspirin Plavix was prescribed Brilinta from [facility] but has not initiated yet due to cost.  Symptoms have improved.  He walks without limitations.  No previous carotid interventions.  Past Medical History:  Diagnosis Date   Arthritis    Chronic kidney disease    Stage 2   CVA (cerebral vascular accident) (HCC) 10/2014    Late effect of cerebrovascular disease.  Mostly resolved, but can see difference in facial expressions.  tp-A, Plavix afterward   Diabetes mellitus without complication (HCC)    type II, controlled, with renal comps   Diabetic foot (HCC) 04/05/2018   Normal sensation, skin intact   Diverticulosis    ED (erectile dysfunction)    GI bleed 04/2017   Gout    History of claustrophobia    with MRI   Hx of adenomatous polyp of rectum 06/10/2017   Hypercholesterolemia    Hypertension    Long term (current) use of insulin (HCC)    Remains on basal/bolus therapy without hypoglycemia.   Microalbuminuria 2013   Morbid obesity (HCC)    Obesity    OSA (obstructive sleep apnea)    Intolerant to CPAP   Peripheral vascular disease (HCC)    Right calf pain 04/05/2018   Vertigo    Family History  Problem Relation Age of Onset   CVA Mother    Alzheimer's disease Mother    Heart attack Father    Heart disease Father    Diabetes Brother    Rectal cancer Brother    Stomach cancer Neg Hx    Colon cancer Neg Hx    Past Surgical History:  Procedure Laterality Date   ABDOMINAL AORTOGRAM N/A 04/13/2018   Procedure: ABDOMINAL AORTOGRAM;  Surgeon: Fransisco Hertz, MD;   Location: [facility] INVASIVE CV LAB;  Service: Cardiovascular;  Laterality: N/A;   APPLICATION OF WOUND VAC Bilateral 05/30/2018   Procedure: APPLICATION OF WOUND VAC;  Surgeon: Fransisco Hertz, MD;  Location: [facility] OR;  Service: Vascular;  Laterality: Bilateral;   AXILLARY-FEMORAL BYPASS GRAFT Left 05/30/2018   Procedure: BYPASS GRAFT LEFT AXILLA-BIFEMORAL WITH HEMO SHIELD GOLD VASCULAR GRAFTS;  Surgeon: Fransisco Hertz, MD;  Location: [facility] OR;  Service: Vascular;  Laterality: Left;   CATARACT EXTRACTION W/PHACO Right 07/03/2021   Procedure: CATARACT EXTRACTION PHACO AND INTRAOCULAR LENS PLACEMENT (IOC);  Surgeon: Fabio Pierce, MD;  Location: [facility];  Service: Ophthalmology;  Laterality: Right;  CDE 6.99   CATARACT EXTRACTION W/PHACO Left 07/17/2021   Procedure: CATARACT EXTRACTION PHACO AND INTRAOCULAR LENS PLACEMENT (IOC);  Surgeon: Fabio Pierce, MD;  Location: [facility];  Service: Ophthalmology;  Laterality: Left;  CDE 6.92   COLONOSCOPY WITH PROPOFOL N/A 06/08/2017   Procedure: COLONOSCOPY WITH PROPOFOL ( Ultra-Slim Scope);  Surgeon: Iva Boop, MD;  Location: [facility];  Service: Endoscopy;  Laterality: N/A;   ENDARTERECTOMY FEMORAL Bilateral 05/30/2018   Procedure: ENDARTERECTOMY FEMORAL LEFT;  Surgeon: Fransisco Hertz, MD;  Location: [facility] OR;  Service: Vascular;  Laterality: Bilateral;   INTRAOPERATIVE  ARTERIOGRAM Bilateral 05/30/2018   Procedure: INTRA OPERATIVE ARTERIOGRAM BILATERAL LOWER EXTREMITIES;  Surgeon: Fransisco Hertz, MD;  Location: [facility] OR;  Service: Vascular;  Laterality: Bilateral;   LOWER EXTREMITY ANGIOGRAPHY N/A 04/13/2018   Procedure: LOWER EXTREMITY ANGIOGRAPHY;  Surgeon: Fransisco Hertz, MD;  Location: [facility] INVASIVE CV LAB;  Service: Cardiovascular;  Laterality: N/A;  bilateral    UMBILICAL HERNIA REPAIR      Short Social History:  Social History   Tobacco Use   Smoking status: Former    Current packs/day: 0.00    Average packs/day: 1 pack/day for 15.0 years (15.0 ttl pk-yrs)    Types:  Cigarettes    Start date: 06/06/1981    Quit date: 06/06/1996    Years since quitting: 27.6   Smokeless tobacco: Never   Tobacco comments:    Quit 20 years ago  Substance Use Topics   Alcohol use: No    Alcohol/week: 0.0 standard drinks of alcohol    No Known Allergies  Current Outpatient Medications  Medication Sig Dispense Refill   Accu-Chek FastClix Lancets MISC      ACCU-CHEK GUIDE test strip USE STRIP TO CHECK GLUCOSE THREE TIMES DAILY     acetaminophen (TYLENOL) 650 MG CR tablet Take 1,300 mg by mouth every 8 (eight) hours as needed for pain.     amLODipine (NORVASC) 10 MG tablet Take 10 mg by mouth daily.     aspirin EC 81 MG tablet Take 81 mg by mouth daily.     atorvastatin (LIPITOR) 80 MG tablet Take 1 tablet by mouth daily.     colchicine 0.6 MG tablet Take 0.6 mg by mouth daily as needed (gout).     ezetimibe (ZETIA) 10 MG tablet Take 10 mg by mouth daily.     HUMALOG KWIKPEN 100 UNIT/ML KwikPen Inject 8 Units into the skin 2 (two) times daily before a meal.     hydrALAZINE (APRESOLINE) 25 MG tablet Take 1 tablet (25 mg total) by mouth 3 (three) times daily. 270 tablet 3   Insulin Pen Needle (B-D ULTRAFINE III SHORT PEN) 31G X 8 MM MISC use pen needs 4 times a day with insulin  (DX: ICD10: E11.51)     Insulin Syringe-Needle U-100 31G X 5/16" 1 ML MISC use one needle once daily     LANTUS SOLOSTAR 100 UNIT/ML Solostar Pen Inject 28 Units into the skin 2 (two) times daily.     loratadine (CLARITIN) 10 MG tablet Take 1 tablet (10 mg total) by mouth daily. 30 tablet 1   losartan-hydrochlorothiazide (HYZAAR) 100-25 MG per tablet Take 1 tablet by mouth daily.     metoprolol tartrate (LOPRESSOR) 25 MG tablet Take 25 mg by mouth 2 (two) times daily.     Multiple Vitamins-Minerals (EMERGEN-C IMMUNE PLUS) PACK Take 1 Scoop by mouth daily.     nitroGLYCERIN (NITROSTAT) 0.4 MG SL tablet Place 1 tablet (0.4 mg total) under the tongue every 5 (five) minutes as needed for chest pain.  (Patient taking differently: Place 0.4 mg under the tongue every 5 (five) minutes x 3 doses as needed for chest pain (if no relief after 3rd dose, proceed to ED or call 911).) 25 tablet 3   omeprazole (PRILOSEC) 20 MG capsule Take 20 mg by mouth daily.     tamsulosin (FLOMAX) 0.4 MG CAPS capsule Take 0.4 mg by mouth at bedtime.     ticagrelor (BRILINTA) 90 MG TABS tablet Take 1 tablet (90 mg total) by mouth 2 (two)  times daily. 60 tablet 0   No current facility-administered medications for this visit.    Review of Systems  Constitutional:  Constitutional negative. HENT: HENT negative.  Eyes: Positive for visual disturbance.   GI: Gastrointestinal negative.  Skin: Skin negative.  Neurological: Positive for focal weakness.  Hematologic: Hematologic/lymphatic negative.  Psychiatric: Psychiatric negative.        Objective:  Objective   Vitals:   02/01/24 1029 02/01/24 1032  BP: (!) 164/76 (!) 164/80  Pulse: (!) 51   Resp: 20   Temp: 97.9 F (36.6 C)   SpO2: 94%   Weight: 256 lb (116.1 kg)   Height: 5\' 9"  (1.753 m)    Body mass index is 37.8 kg/m.  Physical Exam Constitutional:      Appearance: He is obese.  HENT:     Head: Normocephalic.     Nose: Nose normal.  Eyes:     Pupils: Pupils are equal, round, and reactive to light.  Neck:     Vascular: No carotid bruit.  Cardiovascular:     Rate and Rhythm: Normal rate.  Pulmonary:     Effort: Pulmonary effort is normal.  Abdominal:     General: Abdomen is flat.  Musculoskeletal:        General: Normal range of motion.     Right lower leg: No edema.     Left lower leg: No edema.  Skin:    Capillary Refill: Capillary refill takes less than 2 seconds.  Neurological:     Mental Status: He is alert.     Motor: Weakness present.  Psychiatric:        Mood and Affect: Mood normal.     Data: Right Carotid Findings:  +----------+--------+--------+--------+-------------------------+--------+           PSV cm/sEDV  cm/sStenosisPlaque Description       Comments  +----------+--------+--------+--------+-------------------------+--------+  CCA Prox  41      5                                                  +----------+--------+--------+--------+-------------------------+--------+  CCA Distal25      6                                                  +----------+--------+--------+--------+-------------------------+--------+  ICA Prox  53      19      1-39%   homogeneous and irregular          +----------+--------+--------+--------+-------------------------+--------+  ICA Mid   50      9                                                  +----------+--------+--------+--------+-------------------------+--------+  ICA Distal145     30                                                 +----------+--------+--------+--------+-------------------------+--------+  ECA      0  0       Occluded                                   +----------+--------+--------+--------+-------------------------+--------+   +----------+--------+-------+----------------+-------------------+           PSV cm/sEDV cmsDescribe        Arm Pressure (mmHG)  +----------+--------+-------+----------------+-------------------+  [id number]    0      Multiphasic, [id number]                  +----------+--------+-------+----------------+-------------------+   +---------+--------+--+--------+-+---------+  VertebralPSV cm/s41EDV cm/s7Antegrade  +---------+--------+--+--------+-+---------+      Left Carotid Findings:  +---------+--------+-------+--------+---------------------------------+----  ----+          PSV cm/sEDV    StenosisPlaque Description                Comments                  cm/s                                                       +---------+--------+-------+--------+---------------------------------+----  ----+  CCA Prox 52      10                                                          +---------+--------+-------+--------+---------------------------------+----  ----+  CCA     51      14                                                         Distal                                                                      +---------+--------+-------+--------+---------------------------------+----  ----+  ICA Prox 45      16     1-39%   heterogenous and irregular                  +---------+--------+-------+--------+---------------------------------+----  ----+  ICA Mid  153     37     40-59%  heterogenous, irregular and                                                 calcific                                    +---------+--------+-------+--------+---------------------------------+----  ----+  ICA     158     31  Distal                                                                      +---------+--------+-------+--------+---------------------------------+----  ----+  ECA     67      5                                                          +---------+--------+-------+--------+---------------------------------+----  ----+   +----------+--------+--------+----------------+-------------------+           PSV cm/sEDV cm/sDescribe        Arm Pressure (mmHG)  +----------+--------+--------+----------------+-------------------+  [id number]    0       Multiphasic, [id number]                  +----------+--------+--------+----------------+-------------------+   +---------+--------+--+--------+--+---------+  VertebralPSV cm/s57EDV cm/s15Antegrade  +---------+--------+--+--------+--+---------+     Summary:  Right Carotid: Velocities in the right ICA are consistent with a 1-39%  stenosis.                Non-hemodynamically significant plaque <50% noted in the  CCA. The                 ECA appears  occluded.   Left Carotid: Velocities in the left ICA (mid) are consistent with a  40-59%               stenosis. Non-hemodynamically significant plaque <50% noted  in the                CCA. The ECA appears <50% stenosed.   Vertebrals:  Bilateral vertebral arteries demonstrate antegrade flow.  Subclavians: Normal flow hemodynamics were seen in bilateral subclavian               arteries.    CTA IMPRESSION: 1. Approximately 80% stenosis of the right ICA at the C2 level. 2. Severe left and moderate right intradural vertebral artery stenosis. 3. Approximately 50% stenosis of the proximal left ICA. 4. Suspected bilateral vertebral artery origins/V1 segment stenosis, but evaluation is limited due to artifact. 5.  Aortic Atherosclerosis (ICD10-I70.0).  I evaluated his right carotid artery at bedside with ultrasound.  Patient has very good mobility of his neck and the carotid is actually not that deep measuring approximately 1 cm deep above the clavicle when he turns to the left and extends his neck.       Assessment/Plan:    78 year old male here for evaluation of symptomatic right ICA stenosis.  He appears to have tandem lesions in the common carotid artery as well as the distal ICA at the level of C2.  By CTA his neck appears very deep with a very short runway for any sort of carotid stenting.  We did discuss his options being continued medical therapy and he would require aspirin and Brilinta given that he was on aspirin and Plavix at the time of his surgery.  We also discussed his options of stenting with the limitations noted above both from a transfemoral and transcarotid route.  Ultimately we discussed carotid endarterectomy and I think that  the lesion is quite high at the C2 level and would be high risk for cranial nerve injury approximately 5% and likely stroke approximately 5% then a typical carotid endarterectomy.  Given the anatomy and appearance of this lesion we discussed  intervening and have discussed proceeding with carotid endarterectomy.  This was a shared decision with the patient and his daughter and wife at bedside and they demonstrate good understanding.  We did discuss all the risk and benefits as well as expected procedural outcomes and hospitalization timing.  They demonstrate good understanding we will get him scheduled within the next 10 days given that his stroke was this past weekend.     Maeola Harman MD [facility]

## 2024-02-01 NOTE — Telephone Encounter (Signed)
 30 day cardiac monitor ordered and sent to pt's home.

## 2024-02-01 NOTE — Progress Notes (Signed)
 Patient ID: Jacob Avery, male   DOB: October 18, 1946, 78 y.o.   MRN: [id number]  Reason for Consult: Follow-up   Referred by [name], Adelfa Koh, MD  Subjective:     HPI:  Jacob Avery is a 78 y.o. male recently evaluated at [facility] for right-sided stroke with left hand weakness and loss of peripheral vision in the left eye.  He also has a previous stroke in 2015 that left him with left leg weakness.  He is on aspirin Plavix was prescribed Brilinta from [facility] but has not initiated yet due to cost.  Symptoms have improved.  He walks without limitations.  No previous carotid interventions.  Past Medical History:  Diagnosis Date   Arthritis    Chronic kidney disease    Stage 2   CVA (cerebral vascular accident) (HCC) 10/2014    Late effect of cerebrovascular disease.  Mostly resolved, but can see difference in facial expressions.  tp-A, Plavix afterward   Diabetes mellitus without complication (HCC)    type II, controlled, with renal comps   Diabetic foot (HCC) 04/05/2018   Normal sensation, skin intact   Diverticulosis    ED (erectile dysfunction)    GI bleed 04/2017   Gout    History of claustrophobia    with MRI   Hx of adenomatous polyp of rectum 06/10/2017   Hypercholesterolemia    Hypertension    Long term (current) use of insulin (HCC)    Remains on basal/bolus therapy without hypoglycemia.   Microalbuminuria 2013   Morbid obesity (HCC)    Obesity    OSA (obstructive sleep apnea)    Intolerant to CPAP   Peripheral vascular disease (HCC)    Right calf pain 04/05/2018   Vertigo    Family History  Problem Relation Age of Onset   CVA Mother    Alzheimer's disease Mother    Heart attack Father    Heart disease Father    Diabetes Brother    Rectal cancer Brother    Stomach cancer Neg Hx    Colon cancer Neg Hx    Past Surgical History:  Procedure Laterality Date   ABDOMINAL AORTOGRAM N/A 04/13/2018   Procedure: ABDOMINAL AORTOGRAM;  Surgeon: Fransisco Hertz, MD;   Location: [facility] INVASIVE CV LAB;  Service: Cardiovascular;  Laterality: N/A;   APPLICATION OF WOUND VAC Bilateral 05/30/2018   Procedure: APPLICATION OF WOUND VAC;  Surgeon: Fransisco Hertz, MD;  Location: [facility] OR;  Service: Vascular;  Laterality: Bilateral;   AXILLARY-FEMORAL BYPASS GRAFT Left 05/30/2018   Procedure: BYPASS GRAFT LEFT AXILLA-BIFEMORAL WITH HEMO SHIELD GOLD VASCULAR GRAFTS;  Surgeon: Fransisco Hertz, MD;  Location: [facility] OR;  Service: Vascular;  Laterality: Left;   CATARACT EXTRACTION W/PHACO Right 07/03/2021   Procedure: CATARACT EXTRACTION PHACO AND INTRAOCULAR LENS PLACEMENT (IOC);  Surgeon: Fabio Pierce, MD;  Location: [facility] ORS;  Service: Ophthalmology;  Laterality: Right;  CDE 6.99   CATARACT EXTRACTION W/PHACO Left 07/17/2021   Procedure: CATARACT EXTRACTION PHACO AND INTRAOCULAR LENS PLACEMENT (IOC);  Surgeon: Fabio Pierce, MD;  Location: [facility] ORS;  Service: Ophthalmology;  Laterality: Left;  CDE 6.92   COLONOSCOPY WITH PROPOFOL N/A 06/08/2017   Procedure: COLONOSCOPY WITH PROPOFOL ( Ultra-Slim Scope);  Surgeon: Iva Boop, MD;  Location: [facility];  Service: Endoscopy;  Laterality: N/A;   ENDARTERECTOMY FEMORAL Bilateral 05/30/2018   Procedure: ENDARTERECTOMY FEMORAL LEFT;  Surgeon: Fransisco Hertz, MD;  Location: [facility] OR;  Service: Vascular;  Laterality: Bilateral;   INTRAOPERATIVE  ARTERIOGRAM Bilateral 05/30/2018   Procedure: INTRA OPERATIVE ARTERIOGRAM BILATERAL LOWER EXTREMITIES;  Surgeon: Fransisco Hertz, MD;  Location: [facility] OR;  Service: Vascular;  Laterality: Bilateral;   LOWER EXTREMITY ANGIOGRAPHY N/A 04/13/2018   Procedure: LOWER EXTREMITY ANGIOGRAPHY;  Surgeon: Fransisco Hertz, MD;  Location: [facility] INVASIVE CV LAB;  Service: Cardiovascular;  Laterality: N/A;  bilateral    UMBILICAL HERNIA REPAIR      Short Social History:  Social History   Tobacco Use   Smoking status: Former    Current packs/day: 0.00    Average packs/day: 1 pack/day for 15.0 years (15.0 ttl pk-yrs)    Types:  Cigarettes    Start date: 06/06/1981    Quit date: 06/06/1996    Years since quitting: 27.6   Smokeless tobacco: Never   Tobacco comments:    Quit 20 years ago  Substance Use Topics   Alcohol use: No    Alcohol/week: 0.0 standard drinks of alcohol    No Known Allergies  Current Outpatient Medications  Medication Sig Dispense Refill   Accu-Chek FastClix Lancets MISC      ACCU-CHEK GUIDE test strip USE STRIP TO CHECK GLUCOSE THREE TIMES DAILY     acetaminophen (TYLENOL) 650 MG CR tablet Take 1,300 mg by mouth every 8 (eight) hours as needed for pain.     amLODipine (NORVASC) 10 MG tablet Take 10 mg by mouth daily.     aspirin EC 81 MG tablet Take 81 mg by mouth daily.     atorvastatin (LIPITOR) 80 MG tablet Take 1 tablet by mouth daily.     colchicine 0.6 MG tablet Take 0.6 mg by mouth daily as needed (gout).     ezetimibe (ZETIA) 10 MG tablet Take 10 mg by mouth daily.     HUMALOG KWIKPEN 100 UNIT/ML KwikPen Inject 8 Units into the skin 2 (two) times daily before a meal.     hydrALAZINE (APRESOLINE) 25 MG tablet Take 1 tablet (25 mg total) by mouth 3 (three) times daily. 270 tablet 3   Insulin Pen Needle (B-D ULTRAFINE III SHORT PEN) 31G X 8 MM MISC use pen needs 4 times a day with insulin  (DX: ICD10: E11.51)     Insulin Syringe-Needle U-100 31G X 5/16" 1 ML MISC use one needle once daily     LANTUS SOLOSTAR 100 UNIT/ML Solostar Pen Inject 28 Units into the skin 2 (two) times daily.     loratadine (CLARITIN) 10 MG tablet Take 1 tablet (10 mg total) by mouth daily. 30 tablet 1   losartan-hydrochlorothiazide (HYZAAR) 100-25 MG per tablet Take 1 tablet by mouth daily.     metoprolol tartrate (LOPRESSOR) 25 MG tablet Take 25 mg by mouth 2 (two) times daily.     Multiple Vitamins-Minerals (EMERGEN-C IMMUNE PLUS) PACK Take 1 Scoop by mouth daily.     nitroGLYCERIN (NITROSTAT) 0.4 MG SL tablet Place 1 tablet (0.4 mg total) under the tongue every 5 (five) minutes as needed for chest pain.  (Patient taking differently: Place 0.4 mg under the tongue every 5 (five) minutes x 3 doses as needed for chest pain (if no relief after 3rd dose, proceed to ED or call 911).) 25 tablet 3   omeprazole (PRILOSEC) 20 MG capsule Take 20 mg by mouth daily.     tamsulosin (FLOMAX) 0.4 MG CAPS capsule Take 0.4 mg by mouth at bedtime.     ticagrelor (BRILINTA) 90 MG TABS tablet Take 1 tablet (90 mg total) by mouth 2 (two)  times daily. 60 tablet 0   No current facility-administered medications for this visit.    Review of Systems  Constitutional:  Constitutional negative. HENT: HENT negative.  Eyes: Positive for visual disturbance.   GI: Gastrointestinal negative.  Skin: Skin negative.  Neurological: Positive for focal weakness.  Hematologic: Hematologic/lymphatic negative.  Psychiatric: Psychiatric negative.        Objective:  Objective   Vitals:   02/01/24 1029 02/01/24 1032  BP: (!) 164/76 (!) 164/80  Pulse: (!) 51   Resp: 20   Temp: 97.9 F (36.6 C)   SpO2: 94%   Weight: 256 lb (116.1 kg)   Height: 5\' 9"  (1.753 m)    Body mass index is 37.8 kg/m.  Physical Exam Constitutional:      Appearance: He is obese.  HENT:     Head: Normocephalic.     Nose: Nose normal.  Eyes:     Pupils: Pupils are equal, round, and reactive to light.  Neck:     Vascular: No carotid bruit.  Cardiovascular:     Rate and Rhythm: Normal rate.  Pulmonary:     Effort: Pulmonary effort is normal.  Abdominal:     General: Abdomen is flat.  Musculoskeletal:        General: Normal range of motion.     Right lower leg: No edema.     Left lower leg: No edema.  Skin:    Capillary Refill: Capillary refill takes less than 2 seconds.  Neurological:     Mental Status: He is alert.     Motor: Weakness present.  Psychiatric:        Mood and Affect: Mood normal.     Data: Right Carotid Findings:  +----------+--------+--------+--------+-------------------------+--------+           PSV cm/sEDV  cm/sStenosisPlaque Description       Comments  +----------+--------+--------+--------+-------------------------+--------+  CCA Prox  41      5                                                  +----------+--------+--------+--------+-------------------------+--------+  CCA Distal25      6                                                  +----------+--------+--------+--------+-------------------------+--------+  ICA Prox  53      19      1-39%   homogeneous and irregular          +----------+--------+--------+--------+-------------------------+--------+  ICA Mid   50      9                                                  +----------+--------+--------+--------+-------------------------+--------+  ICA Distal145     30                                                 +----------+--------+--------+--------+-------------------------+--------+  ECA      0  0       Occluded                                   +----------+--------+--------+--------+-------------------------+--------+   +----------+--------+-------+----------------+-------------------+           PSV cm/sEDV cmsDescribe        Arm Pressure (mmHG)  +----------+--------+-------+----------------+-------------------+  [id number]    0      Multiphasic, [id number]                  +----------+--------+-------+----------------+-------------------+   +---------+--------+--+--------+-+---------+  VertebralPSV cm/s41EDV cm/s7Antegrade  +---------+--------+--+--------+-+---------+      Left Carotid Findings:  +---------+--------+-------+--------+---------------------------------+----  ----+          PSV cm/sEDV    StenosisPlaque Description                Comments                  cm/s                                                       +---------+--------+-------+--------+---------------------------------+----  ----+  CCA Prox 52      10                                                          +---------+--------+-------+--------+---------------------------------+----  ----+  CCA     51      14                                                         Distal                                                                      +---------+--------+-------+--------+---------------------------------+----  ----+  ICA Prox 45      16     1-39%   heterogenous and irregular                  +---------+--------+-------+--------+---------------------------------+----  ----+  ICA Mid  153     37     40-59%  heterogenous, irregular and                                                 calcific                                    +---------+--------+-------+--------+---------------------------------+----  ----+  ICA     158     31  Distal                                                                      +---------+--------+-------+--------+---------------------------------+----  ----+  ECA     67      5                                                          +---------+--------+-------+--------+---------------------------------+----  ----+   +----------+--------+--------+----------------+-------------------+           PSV cm/sEDV cm/sDescribe        Arm Pressure (mmHG)  +----------+--------+--------+----------------+-------------------+  [id number]    0       Multiphasic, [id number]                  +----------+--------+--------+----------------+-------------------+   +---------+--------+--+--------+--+---------+  VertebralPSV cm/s57EDV cm/s15Antegrade  +---------+--------+--+--------+--+---------+     Summary:  Right Carotid: Velocities in the right ICA are consistent with a 1-39%  stenosis.                Non-hemodynamically significant plaque <50% noted in the  CCA. The                 ECA appears  occluded.   Left Carotid: Velocities in the left ICA (mid) are consistent with a  40-59%               stenosis. Non-hemodynamically significant plaque <50% noted  in the                CCA. The ECA appears <50% stenosed.   Vertebrals:  Bilateral vertebral arteries demonstrate antegrade flow.  Subclavians: Normal flow hemodynamics were seen in bilateral subclavian               arteries.    CTA IMPRESSION: 1. Approximately 80% stenosis of the right ICA at the C2 level. 2. Severe left and moderate right intradural vertebral artery stenosis. 3. Approximately 50% stenosis of the proximal left ICA. 4. Suspected bilateral vertebral artery origins/V1 segment stenosis, but evaluation is limited due to artifact. 5.  Aortic Atherosclerosis (ICD10-I70.0).  I evaluated his right carotid artery at bedside with ultrasound.  Patient has very good mobility of his neck and the carotid is actually not that deep measuring approximately 1 cm deep above the clavicle when he turns to the left and extends his neck.       Assessment/Plan:    78 year old male here for evaluation of symptomatic right ICA stenosis.  He appears to have tandem lesions in the common carotid artery as well as the distal ICA at the level of C2.  By CTA his neck appears very deep with a very short runway for any sort of carotid stenting.  We did discuss his options being continued medical therapy and he would require aspirin and Brilinta given that he was on aspirin and Plavix at the time of his surgery.  We also discussed his options of stenting with the limitations noted above both from a transfemoral and transcarotid route.  Ultimately we discussed carotid endarterectomy and I think that  the lesion is quite high at the C2 level and would be high risk for cranial nerve injury approximately 5% and likely stroke approximately 5% then a typical carotid endarterectomy.  Given the anatomy and appearance of this lesion we discussed  intervening and have discussed proceeding with carotid endarterectomy.  This was a shared decision with the patient and his daughter and wife at bedside and they demonstrate good understanding.  We did discuss all the risk and benefits as well as expected procedural outcomes and hospitalization timing.  They demonstrate good understanding we will get him scheduled within the next 10 days given that his stroke was this past weekend.     Maeola Harman MD [facility]

## 2024-02-02 NOTE — Progress Notes (Signed)
 Surgical Instructions   Your procedure is scheduled on February 07, 2024. Report to [facility] Main Entrance "A" at 9:15 A.M., then check in with the Admitting office. Any questions or running late day of surgery: call [phone]  Questions prior to your surgery date: call [phone], Monday-Friday, 8am-4pm. If you experience any cold or flu symptoms such as cough, fever, chills, shortness of breath, etc. between now and your scheduled surgery, please notify us at the above number.     Remember:  Do not eat after midnight the night before your surgery      Take these medicines the morning of surgery with A SIP OF WATER  acetaminophen (TYLENOL)  amLODipine (NORVASC)  aspirin EC  loratadine (CLARITIN)  metoprolol tartrate (LOPRESSOR)  omeprazole (PRILOSEC)  clopidogrel (PLAVIX)   May take these medicines IF NEEDED: colchicine  nitroGLYCERIN (NITROSTAT)   One week prior to surgery, STOP taking any Aspirin (unless otherwise instructed by your surgeon) Aleve, Naproxen, Ibuprofen, Motrin, Advil, Goody's, BC's, all herbal medications, fish oil, and non-prescription vitamins.           WHAT DO I DO ABOUT MY DIABETES MEDICATION?   Do not take oral diabetes medicines (pills) the morning of surgery.  THE NIGHT BEFORE SURGERY, take no units of HUMALOG KWIKPEN insulin.          THE NIGHT BEFORE SURGERY,take 7 units of   LANTUS THE MORNING OF SURGERY, take 7 units of LANTUS insulin.       THE MORNING OF SURGERY, take 1/2 (half) units of HUMALOG insulin If your CBG is greater than 220 mg/dL, you may take  of your sliding scale (correction) dose of insulin.    The day of surgery, do not take other diabetes injectables, including Byetta (exenatide), Bydureon (exenatide ER), Victoza (liraglutide), or Trulicity (dulaglutide).     HOW TO MANAGE YOUR DIABETES BEFORE AND AFTER SURGERY  Why is it important to control my blood sugar before and after surgery? Improving blood sugar  levels before and after surgery helps healing and can limit problems. A way of improving blood sugar control is eating a healthy diet by:  Eating less sugar and carbohydrates  Increasing activity/exercise  Talking with your doctor about reaching your blood sugar goals High blood sugars (greater than 180 mg/dL) can raise your risk of infections and slow your recovery, so you will need to focus on controlling your diabetes during the weeks before surgery. Make sure that the doctor who takes care of your diabetes knows about your planned surgery including the date and location.  How do I manage my blood sugar before surgery? Check your blood sugar at least 4 times a day, starting 2 days before surgery, to make sure that the level is not too high or low.  Check your blood sugar the morning of your surgery when you wake up and every 2 hours until you get to the Short Stay unit.  If your blood sugar is less than 70 mg/dL, you will need to treat for low blood sugar: Do not take insulin. Treat a low blood sugar (less than 70 mg/dL) with  cup of clear juice (cranberry or apple), 4 glucose tablets, OR glucose gel. Recheck blood sugar in 15 minutes after treatment (to make sure it is greater than 70 mg/dL). If your blood sugar is not greater than 70 mg/dL on recheck, call [phone] for further instructions. Report your blood sugar to the short stay nurse when you get to Short Stay.  If you are admitted to the hospital after surgery: Your blood sugar will be checked by the staff and you will probably be given insulin after surgery (instead of oral diabetes medicines) to make sure you have good blood sugar levels. The goal for blood sugar control after surgery is 80-180 mg/dL.           Do NOT Smoke (Tobacco/Vaping) for 24 hours prior to your procedure.  If you use a CPAP at night, you may bring your mask/headgear for your overnight stay.   You will be asked to remove any contacts, glasses,  piercing's, hearing aid's, dentures/partials prior to surgery. Please bring cases for these items if needed.    Patients discharged the day of surgery will not be allowed to drive home, and someone needs to stay with them for 24 hours.  SURGICAL WAITING ROOM VISITATION Patients may have no more than 2 support people in the waiting area - these visitors may rotate.   Pre-op nurse will coordinate an appropriate time for 1 ADULT support person, who may not rotate, to accompany patient in pre-op.  Children under the age of 54 must have an adult with them who is not the patient and must remain in the main waiting area with an adult.  If the patient needs to stay at the hospital during part of their recovery, the visitor guidelines for inpatient rooms apply.  Please refer to the [facility] website for the visitor guidelines for any additional information.   If you received a COVID test during your pre-op visit  it is requested that you wear a mask when out in public, stay away from anyone that may not be feeling well and notify your surgeon if you develop symptoms. If you have been in contact with anyone that has tested positive in the last 10 days please notify you surgeon.      Pre-operative CHG Bathing Instructions   You can play a key role in reducing the risk of infection after surgery. Your skin needs to be as free of germs as possible. You can reduce the number of germs on your skin by washing with CHG (chlorhexidine gluconate) soap before surgery. CHG is an antiseptic soap that kills germs and continues to kill germs even after washing.   DO NOT use if you have an allergy to chlorhexidine/CHG or antibacterial soaps. If your skin becomes reddened or irritated, stop using the CHG and notify one of our RNs at [phone].              TAKE A SHOWER THE NIGHT BEFORE SURGERY AND THE DAY OF SURGERY    Please keep in mind the following:  DO NOT shave, including legs and underarms, 48 hours  prior to surgery.   You may shave your face before/day of surgery.  Place clean sheets on your bed the night before surgery Use a clean washcloth (not used since being washed) for each shower. DO NOT sleep with pet's night before surgery.  CHG Shower Instructions:  Wash your face and private area with normal soap. If you choose to wash your hair, wash first with your normal shampoo.  After you use shampoo/soap, rinse your hair and body thoroughly to remove shampoo/soap residue.  Turn the water OFF and apply half the bottle of CHG soap to a CLEAN washcloth.  Apply CHG soap ONLY FROM YOUR NECK DOWN TO YOUR TOES (washing for 3-5 minutes)  DO NOT use CHG soap on face, private areas, open  wounds, or sores.  Pay special attention to the area where your surgery is being performed.  If you are having back surgery, having someone wash your back for you may be helpful. Wait 2 minutes after CHG soap is applied, then you may rinse off the CHG soap.  Pat dry with a clean towel  Put on clean pajamas    Additional instructions for the day of surgery: DO NOT APPLY any lotions, deodorants, cologne, or perfumes.   Do not wear jewelry or makeup Do not wear nail polish, gel polish, artificial nails, or any other type of covering on natural nails (fingers and toes) Do not bring valuables to the hospital. [facility] is not responsible for valuables/personal belongings. Put on clean/comfortable clothes.  Please brush your teeth.  Ask your nurse before applying any prescription medications to the skin.

## 2024-02-03 ENCOUNTER — Encounter (HOSPITAL_COMMUNITY): Payer: Self-pay

## 2024-02-03 ENCOUNTER — Emergency Department (HOSPITAL_COMMUNITY)
Admission: EM | Admit: 2024-02-03 | Discharge: 2024-02-03 | Disposition: A | Discharge status: Home / Self Care | Attending: Emergency Medicine | Admitting: Emergency Medicine

## 2024-02-03 ENCOUNTER — Emergency Department (HOSPITAL_COMMUNITY)

## 2024-02-03 ENCOUNTER — Other Ambulatory Visit: Payer: Self-pay

## 2024-02-03 ENCOUNTER — Encounter (HOSPITAL_COMMUNITY)
Admission: RE | Admit: 2024-02-03 | Discharge: 2024-02-03 | Disposition: A | Discharge status: Home / Self Care | Source: Ambulatory Visit | Attending: Vascular Surgery | Admitting: Vascular Surgery

## 2024-02-03 VITALS — BP 134/87 | HR 44 | Temp 98.2°F | Resp 18 | Ht 71.0 in

## 2024-02-03 DIAGNOSIS — R531 Weakness: Secondary | ICD-10-CM | POA: Diagnosis present

## 2024-02-03 DIAGNOSIS — I6521 Occlusion and stenosis of right carotid artery: Secondary | ICD-10-CM | POA: Insufficient documentation

## 2024-02-03 DIAGNOSIS — Z01818 Encounter for other preprocedural examination: Secondary | ICD-10-CM

## 2024-02-03 DIAGNOSIS — R202 Paresthesia of skin: Secondary | ICD-10-CM | POA: Diagnosis not present

## 2024-02-03 DIAGNOSIS — I639 Cerebral infarction, unspecified: Secondary | ICD-10-CM | POA: Diagnosis not present

## 2024-02-03 DIAGNOSIS — Z79899 Other long term (current) drug therapy: Secondary | ICD-10-CM | POA: Diagnosis not present

## 2024-02-03 DIAGNOSIS — Z794 Long term (current) use of insulin: Secondary | ICD-10-CM | POA: Insufficient documentation

## 2024-02-03 DIAGNOSIS — Z8673 Personal history of transient ischemic attack (TIA), and cerebral infarction without residual deficits: Secondary | ICD-10-CM | POA: Insufficient documentation

## 2024-02-03 DIAGNOSIS — E1151 Type 2 diabetes mellitus with diabetic peripheral angiopathy without gangrene: Secondary | ICD-10-CM | POA: Insufficient documentation

## 2024-02-03 DIAGNOSIS — E1122 Type 2 diabetes mellitus with diabetic chronic kidney disease: Secondary | ICD-10-CM | POA: Insufficient documentation

## 2024-02-03 DIAGNOSIS — N182 Chronic kidney disease, stage 2 (mild): Secondary | ICD-10-CM | POA: Diagnosis not present

## 2024-02-03 DIAGNOSIS — G4733 Obstructive sleep apnea (adult) (pediatric): Secondary | ICD-10-CM | POA: Insufficient documentation

## 2024-02-03 DIAGNOSIS — Z01812 Encounter for preprocedural laboratory examination: Secondary | ICD-10-CM | POA: Insufficient documentation

## 2024-02-03 DIAGNOSIS — I252 Old myocardial infarction: Secondary | ICD-10-CM | POA: Insufficient documentation

## 2024-02-03 DIAGNOSIS — Z7982 Long term (current) use of aspirin: Secondary | ICD-10-CM | POA: Insufficient documentation

## 2024-02-03 DIAGNOSIS — Z87891 Personal history of nicotine dependence: Secondary | ICD-10-CM | POA: Insufficient documentation

## 2024-02-03 DIAGNOSIS — R791 Abnormal coagulation profile: Secondary | ICD-10-CM | POA: Diagnosis not present

## 2024-02-03 DIAGNOSIS — I129 Hypertensive chronic kidney disease with stage 1 through stage 4 chronic kidney disease, or unspecified chronic kidney disease: Secondary | ICD-10-CM | POA: Insufficient documentation

## 2024-02-03 LAB — CBC
HCT: 39.4 % (ref 39.0–52.0)
Hemoglobin: 12.5 g/dL — ABNORMAL LOW (ref 13.0–17.0)
MCH: 25.4 pg — ABNORMAL LOW (ref 26.0–34.0)
MCHC: 31.7 g/dL (ref 30.0–36.0)
MCV: 80.1 fL (ref 80.0–100.0)
Platelets: 364 10*3/uL (ref 150–400)
RBC: 4.92 MIL/uL (ref 4.22–5.81)
RDW: 15.3 % (ref 11.5–15.5)
WBC: 10.7 10*3/uL — ABNORMAL HIGH (ref 4.0–10.5)
nRBC: 0 % (ref 0.0–0.2)

## 2024-02-03 LAB — SURGICAL PCR SCREEN
MRSA, PCR: NEGATIVE
Staphylococcus aureus: NEGATIVE

## 2024-02-03 LAB — I-STAT CHEM 8, ED
BUN: 22 mg/dL (ref 8–23)
Calcium, Ion: 0.87 mmol/L — CL (ref 1.15–1.40)
Chloride: 106 mmol/L (ref 98–111)
Creatinine, Ser: 1 mg/dL (ref 0.61–1.24)
Glucose, Bld: 105 mg/dL — ABNORMAL HIGH (ref 70–99)
HCT: 36 % — ABNORMAL LOW (ref 39.0–52.0)
Hemoglobin: 12.2 g/dL — ABNORMAL LOW (ref 13.0–17.0)
Potassium: 4 mmol/L (ref 3.5–5.1)
Sodium: 137 mmol/L (ref 135–145)
TCO2: 27 mmol/L (ref 22–32)

## 2024-02-03 LAB — URINALYSIS, ROUTINE W REFLEX MICROSCOPIC
Bilirubin Urine: NEGATIVE
Glucose, UA: NEGATIVE mg/dL
Hgb urine dipstick: NEGATIVE
Ketones, ur: NEGATIVE mg/dL
Leukocytes,Ua: NEGATIVE
Nitrite: NEGATIVE
Protein, ur: NEGATIVE mg/dL
Specific Gravity, Urine: 1.045 — ABNORMAL HIGH (ref 1.005–1.030)
pH: 5 (ref 5.0–8.0)

## 2024-02-03 LAB — DIFFERENTIAL
Abs Immature Granulocytes: 0.05 10*3/uL (ref 0.00–0.07)
Basophils Absolute: 0.1 10*3/uL (ref 0.0–0.1)
Basophils Relative: 1 %
Eosinophils Absolute: 0.1 10*3/uL (ref 0.0–0.5)
Eosinophils Relative: 1 %
Immature Granulocytes: 1 %
Lymphocytes Relative: 13 %
Lymphs Abs: 1.4 10*3/uL (ref 0.7–4.0)
Monocytes Absolute: 0.5 10*3/uL (ref 0.1–1.0)
Monocytes Relative: 5 %
Neutro Abs: 8.5 10*3/uL — ABNORMAL HIGH (ref 1.7–7.7)
Neutrophils Relative %: 79 %

## 2024-02-03 LAB — COMPREHENSIVE METABOLIC PANEL WITH GFR
ALT: 11 U/L (ref 0–44)
AST: 14 U/L — ABNORMAL LOW (ref 15–41)
Albumin: 3.5 g/dL (ref 3.5–5.0)
Alkaline Phosphatase: 65 U/L (ref 38–126)
Anion gap: 12 (ref 5–15)
BUN: 17 mg/dL (ref 8–23)
CO2: 24 mmol/L (ref 22–32)
Calcium: 9 mg/dL (ref 8.9–10.3)
Chloride: 102 mmol/L (ref 98–111)
Creatinine, Ser: 1.1 mg/dL (ref 0.61–1.24)
GFR, Estimated: >60 mL/min (ref >60)
Glucose, Bld: 128 mg/dL — ABNORMAL HIGH (ref 70–99)
Potassium: 3.5 mmol/L (ref 3.5–5.1)
Sodium: 138 mmol/L (ref 135–145)
Total Bilirubin: 0.7 mg/dL (ref 0.0–1.2)
Total Protein: 6.7 g/dL (ref 6.5–8.1)

## 2024-02-03 LAB — PROTIME-INR
INR: 1.1 (ref 0.8–1.2)
Prothrombin Time: 13.9 s (ref 11.4–15.2)

## 2024-02-03 LAB — TYPE AND SCREEN
ABO/RH(D): A POS
Antibody Screen: NEGATIVE

## 2024-02-03 LAB — RAPID URINE DRUG SCREEN, HOSP PERFORMED
Amphetamines: NOT DETECTED
Barbiturates: NOT DETECTED
Benzodiazepines: NOT DETECTED
Cocaine: NOT DETECTED
Opiates: NOT DETECTED
Tetrahydrocannabinol: NOT DETECTED

## 2024-02-03 LAB — GLUCOSE, CAPILLARY: Glucose-Capillary: 142 mg/dL — ABNORMAL HIGH (ref 70–99)

## 2024-02-03 LAB — ETHANOL: Alcohol, Ethyl (B): <10 mg/dL (ref <10)

## 2024-02-03 LAB — CBG MONITORING, ED: Glucose-Capillary: 125 mg/dL — ABNORMAL HIGH (ref 70–99)

## 2024-02-03 LAB — APTT: aPTT: 31 s (ref 24–36)

## 2024-02-03 MED ORDER — IOHEXOL 350 MG/ML SOLN
75.0000 mL | Freq: Once | INTRAVENOUS | Status: AC | PRN
  Administered 2024-02-03: 75 mL via INTRAVENOUS

## 2024-02-03 NOTE — Progress Notes (Signed)
 PCP - [name], Adelfa Koh, MD  Cardiologist - Wendall Stade, MD   PPM/ICD - denies Device Orders - n/a Rep Notified - n/a  Chest x-ray - 01-29-24 EKG - 01-29-24 Stress Test - 04-25-18 ECHO - 01-30-24 Cardiac Cath -   Sleep Study - 2016 CPAP - unable to tolerate  Fasting Blood Sugar -  per patient and family blood sugar between 98-100 Checks Blood Sugar BID times a day  Last dose of GLP1 agonist-  denies GLP1 instructions:   Blood Thinner Instructions:continue plavix per surgeons orders Aspirin Instructions: continue with medication per surgeon  ERAS Protcol - NPO PRE-SURGERY Ensure or G2-   COVID TEST- n/a   Anesthesia review: yes. Patient went to ED upon arrival to PAT appointment. Family concerned about weakness to left hand and vision to left eye.  Grip to left hand very weak compared to right hand.   Patient returned for PAT appointment grip left hand improved patient was able to have clear conversation with nurse. Per family neurology saw patient in ED voiced recommendation that patient continue with scheduled surgery.  Patient denies shortness of breath, fever, cough and chest pain at PAT appointment   All instructions explained to the patient, with a verbal understanding of the material. Patient agrees to go over the instructions while at home for a better understanding. Patient also instructed to self quarantine after being tested for COVID-19. The opportunity to ask questions was provided.

## 2024-02-03 NOTE — Progress Notes (Signed)
 Patient arrived for PAT appointment daughter concern patient may have had another stroke. Per daughter his left hand grip was better last evening grip is very weak compared to right hand Per patient that symptom was new. Per daughter patient reported that peripheral vision was not there. Anesthesia made aware requested that Dr Randie Heinz be made aware as well and patient be sent to ED Patient transported via transport chair and family

## 2024-02-03 NOTE — Consult Note (Signed)
 NEUROLOGY CONSULT NOTE   Date of service: February 03, 2024 Patient Name: Jacob Avery MRN:  [id number] DOB:  12/27/1945 Chief Complaint: "code stroke " Requesting Provider: Terrilee Files, MD  History of Present Illness  Jacob Avery is a 79 y.o. male with hx of prior CVA's with most recent on 12/2023, DN, CKD,  HTN, HLd, OSA, PVD, Gout, obesity who presented to [facility] for pre op testing. He and his wife were driving to the hospital when he acutely developed worsening weakness and numbness of left arm.  Code stoke called by ED.  Patient seen and examined int he CT suite. NIHSS 2 for left arm and leg weakness with drift. He denies facial droop, slurred speech or headache. He initially stated he had numbness but what he meant was worsening weakness of left arm. When sensation tested he denied any numbness.  Ct head with no acute process with Aspects 10. CTA head and neck with no LVO.   He was admitted on 3/30-3/31/25 for AMS and left arm weakness, with MRI brain revealed Numerous small acute right cerebral infarcts CTA demonstrating positive right-sided carotid plaque. He was discharged on ASA and Brilinta and follow up with vascular surgery.   LKW: last pm  Modified rankin score: 1-No significant post stroke disability and can perform usual duties with stroke symptoms IV Thrombolysis:  No outside window  EVT:  No LVO    NIHSS components Score: Comment  1a Level of Conscious 0[x]  1[]  2[]  3[]      1b LOC Questions 0[x]  1[]  2[]       1c LOC Commands 0[x]  1[]  2[]       2 Best Gaze 0[x]  1[]  2[]       3 Visual 0[x]  1[]  2[]  3[]      4 Facial Palsy 0[x]  1[]  2[]  3[]      5a Motor Arm - left 0[]  1[x]  2[]  3[]  4[]  UN[]    5b Motor Arm - Right 0[x]  1[]  2[]  3[]  4[]  UN[]    6a Motor Leg - Left 0[]  1[x]  2[]  3[]  4[]  UN[]    6b Motor Leg - Right 0[x]  1[]  2[]  3[]  4[]  UN[]    7 Limb Ataxia 0[x]  1[]  2[]  3[]  UN[]     8 Sensory 0[x]  1[]  2[]  UN[]      9 Best Language 0[x]  1[]  2[]  3[]      10 Dysarthria 0[x]  1[]  2[]  UN[]       11 Extinct. and Inattention 0[x]  1[]  2[]       TOTAL:       ROS  Comprehensive ROS performed and pertinent positives documented in HPI    Past History   Past Medical History:  Diagnosis Date   Arthritis    Chronic kidney disease    Stage 2   CVA (cerebral vascular accident) (HCC) 10/2014    Late effect of cerebrovascular disease.  Mostly resolved, but can see difference in facial expressions.  tp-A, Plavix afterward   Diabetes mellitus without complication (HCC)    type II, controlled, with renal comps   Diabetic foot (HCC) 04/05/2018   Normal sensation, skin intact   Diverticulosis    ED (erectile dysfunction)    GI bleed 04/2017   Gout    History of claustrophobia    with MRI   Hx of adenomatous polyp of rectum 06/10/2017   Hypercholesterolemia    Hypertension    Long term (current) use of insulin (HCC)    Remains on basal/bolus therapy without hypoglycemia.   Microalbuminuria 2013   Morbid obesity (HCC)  Obesity    OSA (obstructive sleep apnea)    Intolerant to CPAP   Peripheral vascular disease (HCC)    Right calf pain 04/05/2018   Vertigo     Past Surgical History:  Procedure Laterality Date   ABDOMINAL AORTOGRAM N/A 04/13/2018   Procedure: ABDOMINAL AORTOGRAM;  Surgeon: Fransisco Hertz, MD;  Location: [facility] INVASIVE CV LAB;  Service: Cardiovascular;  Laterality: N/A;   APPLICATION OF WOUND VAC Bilateral 05/30/2018   Procedure: APPLICATION OF WOUND VAC;  Surgeon: Fransisco Hertz, MD;  Location[facility] OR;  Service: Vascular;  Laterality: Bilateral;   AXILLARY-FEMORAL BYPASS GRAFT Left 05/30/2018   Procedure: BYPASS GRAFT LEFT AXILLA-BIFEMORAL WITH HEMO SHIELD GOLD VASCULAR GRAFTS;  Surgeon: Fransisco Hertz, MD;  Location: [facility] OR;  Service: Vascular;  Laterality: Left;   CATARACT EXTRACTION W/PHACO Right 07/03/2021   Procedure: CATARACT EXTRACTION PHACO AND INTRAOCULAR LENS PLACEMENT (IOC);  Surgeon: Fabio Pierce, MD;  Location: [facility] ORS;  Service: Ophthalmology;  Laterality:  Right;  CDE 6.99   CATARACT EXTRACTION W/PHACO Left 07/17/2021   Procedure: CATARACT EXTRACTION PHACO AND INTRAOCULAR LENS PLACEMENT (IOC);  Surgeon: Fabio Pierce, MD;  Location: [facility] ORS;  Service: Ophthalmology;  Laterality: Left;  CDE 6.92   COLONOSCOPY WITH PROPOFOL N/A 06/08/2017   Procedure: COLONOSCOPY WITH PROPOFOL ( Ultra-Slim Scope);  Surgeon: Iva Boop, MD;  Location: [facility];  Service: Endoscopy;  Laterality: N/A;   ENDARTERECTOMY FEMORAL Bilateral 05/30/2018   Procedure: ENDARTERECTOMY FEMORAL LEFT;  Surgeon: Fransisco Hertz, MD;  Location: [facility] OR;  Service: Vascular;  Laterality: Bilateral;   INTRAOPERATIVE ARTERIOGRAM Bilateral 05/30/2018   Procedure: INTRA OPERATIVE ARTERIOGRAM BILATERAL LOWER EXTREMITIES;  Surgeon: Fransisco Hertz, MD;  Location: [facility] OR;  Service: Vascular;  Laterality: Bilateral;   LOWER EXTREMITY ANGIOGRAPHY N/A 04/13/2018   Procedure: LOWER EXTREMITY ANGIOGRAPHY;  Surgeon: Fransisco Hertz, MD;  Location: [facility] INVASIVE CV LAB;  Service: Cardiovascular;  Laterality: N/A;  bilateral    UMBILICAL HERNIA REPAIR      Family History: Family History  Problem Relation Age of Onset   CVA Mother    Alzheimer's disease Mother    Heart attack Father    Heart disease Father    Diabetes Brother    Rectal cancer Brother    Stomach cancer Neg Hx    Colon cancer Neg Hx     Social History  reports that he quit smoking about 27 years ago. His smoking use included cigarettes. He started smoking about 42 years ago. He has a 15 pack-year smoking history. He has never used smokeless tobacco. He reports that he does not drink alcohol and does not use drugs.  No Known Allergies  Medications  No current facility-administered medications for this encounter.  Current Outpatient Medications:    Accu-Chek FastClix Lancets MISC, , Disp: , Rfl:    ACCU-CHEK GUIDE test strip, USE STRIP TO CHECK GLUCOSE THREE TIMES DAILY, Disp: , Rfl:    acetaminophen (TYLENOL) 650 MG CR tablet,  Take 1,300 mg by mouth in the morning and at bedtime., Disp: , Rfl:    amLODipine (NORVASC) 10 MG tablet, Take 10 mg by mouth in the morning., Disp: , Rfl:    aspirin EC 81 MG tablet, Take 81 mg by mouth in the morning., Disp: , Rfl:    atorvastatin (LIPITOR) 80 MG tablet, Take 80 mg by mouth in the morning., Disp: , Rfl:    clopidogrel (PLAVIX) 75 MG tablet, Take 75 mg by mouth in the morning.,  Disp: , Rfl:    colchicine 0.6 MG tablet, Take 0.6 mg by mouth daily as needed (gout)., Disp: , Rfl:    ezetimibe (ZETIA) 10 MG tablet, Take 10 mg by mouth in the morning., Disp: , Rfl:    ferrous sulfate 325 (65 FE) MG EC tablet, Take 325 mg by mouth in the morning., Disp: , Rfl:    HUMALOG KWIKPEN 100 UNIT/ML KwikPen, Inject 0-8 Units into the skin 2 (two) times daily as needed. Sliding scale Take 2 units if greater then 150, Disp: , Rfl:    hydrALAZINE (APRESOLINE) 25 MG tablet, Take 1 tablet (25 mg total) by mouth 3 (three) times daily., Disp: 270 tablet, Rfl: 3   Insulin Pen Needle (B-D ULTRAFINE III SHORT PEN) 31G X 8 MM MISC, use pen needs 4 times a day with insulin  (DX: ICD10: E11.51), Disp: , Rfl:    Insulin Syringe-Needle U-100 31G X 5/16" 1 ML MISC, use one needle once daily, Disp: , Rfl:    LANTUS SOLOSTAR 100 UNIT/ML Solostar Pen, Inject 14 Units into the skin 2 (two) times daily., Disp: , Rfl:    loratadine (CLARITIN) 10 MG tablet, Take 1 tablet (10 mg total) by mouth daily., Disp: 30 tablet, Rfl: 1   losartan-hydrochlorothiazide (HYZAAR) 100-25 MG per tablet, Take 1 tablet by mouth in the morning., Disp: , Rfl:    metoprolol tartrate (LOPRESSOR) 25 MG tablet, Take 25 mg by mouth 2 (two) times daily., Disp: , Rfl:    Multiple Vitamins-Minerals (EMERGEN-C IMMUNE PLUS) PACK, Take 1 packet by mouth daily as needed (immune support/fatigue)., Disp: , Rfl:    nitroGLYCERIN (NITROSTAT) 0.4 MG SL tablet, Place 1 tablet (0.4 mg total) under the tongue every 5 (five) minutes as needed for chest pain.,  Disp: 25 tablet, Rfl: 3   omeprazole (PRILOSEC) 20 MG capsule, Take 20 mg by mouth daily before breakfast., Disp: , Rfl:    tamsulosin (FLOMAX) 0.4 MG CAPS capsule, Take 0.4 mg by mouth at bedtime., Disp: , Rfl:    ticagrelor (BRILINTA) 90 MG TABS tablet, Take 1 tablet (90 mg total) by mouth 2 (two) times daily., Disp: 60 tablet, Rfl: 0  Vitals   Vitals:   02/03/24 1115 02/03/24 1121  BP: (!) 159/70   Pulse: (!) 49   Resp: 18   Temp: 98.4 F (36.9 C)   SpO2: 97%   Weight:  117.9 kg  Height:  5\' 11"  (1.803 m)    Body mass index is 36.26 kg/m.  Physical Exam   Constitutional: Appears well-developed and well-nourished.  Psych: Affect appropriate to situation.  Eyes: No scleral injection.  HENT: No OP obstruction.  Head: Normocephalic.  Cardiovascular: Normal rate and regular rhythm.  Respiratory: Effort normal, non-labored breathing.  GI: Soft.  No distension. There is no tenderness.  Skin: WDI.   Neurologic Examination   Mental Status -  Level of arousal and orientation to time, place, and person were intact. Language including expression, naming, repetition, comprehension was assessed and found intact. Attention span and concentration were normal. Recent and remote memory were intact. Fund of Knowledge was assessed and was intact.  Cranial Nerves II - XII - II - Visual field intact OU. III, IV, VI - Extraocular movements intact. V - Facial sensation intact bilaterally. VII - Facial movement intact bilaterally. VIII - Hearing & vestibular intact bilaterally. X - Palate elevates symmetrically. XI - Chin turning & shoulder shrug intact bilaterally. XII - Tongue protrusion intact.  Motor Strength -left  arm and left leg 4/5 with a drift, right arm and right leg 5/5  Bulk was normal and fasciculations were absent.   Motor Tone - Muscle tone was assessed at the neck and appendages and was normal. Sensory - Light touch, temperature/pinprick were assessed and were  symmetrical.   Coordination - The patient had normal movements in the hands and feet with no ataxia or dysmetria.  Tremor was absent. Gait and Station - deferred.  Labs/Imaging/Neurodiagnostic studies   CBC:  Recent Labs  Lab Jan 30, 2024 1503  WBC 9.5  NEUTROABS 8.2*  HGB 12.3*  HCT 40.1  MCV 81.5  PLT 343   Basic Metabolic Panel:  Lab Results  Component Value Date   NA 139 01/30/24   K 3.4 (L) 01-30-24   CO2 26 Jan 30, 2024   GLUCOSE 156 (H) 01-30-24   BUN 19 2024-01-30   CREATININE 1.06 01/30/24   CALCIUM 9.0 01/30/24   GFRNONAA >60 01/30/24   GFRAA >60 05/31/2018   Lipid Panel:  Lab Results  Component Value Date   LDLCALC 48 01/30/2024   HgbA1c:  Lab Results  Component Value Date   HGBA1C 6.1 (H) 01/30/2024   Urine Drug Screen: No results found for: "LABOPIA", "COCAINSCRNUR", "LABBENZ", "AMPHETMU", "THCU", "LABBARB"  Alcohol Level     Component Value Date/Time   ETH <5 10/25/2014 1311   INR  Lab Results  Component Value Date   INR 0.97 05/26/2018   APTT  Lab Results  Component Value Date   APTT 32 05/26/2018   AED levels: No results found for: "PHENYTOIN", "ZONISAMIDE", "LAMOTRIGINE", "LEVETIRACETA"  CT Head without contrast(Personally reviewed): No acute process  CT angio Head and Neck with contrast(Personally reviewed): NO LVO     ASSESSMENT   Jacob Avery is a 78 y.o. male  hx of prior CVA's with most recent on 12/2023, DN, CKD,  HTN, HLd, OSA, PVD, Gout, obesity who presented to [facility] for prop testing. He and his wife were driving to the hospital when he acutely developed worsening weakness and numbness of left arm. Ct head with no acute stroke and worsening weakness is likely due to evolution of recent stroke.   RECOMMENDATIONS  Q 2 hr VS and Neurochecks  Proceed with pre op workup for CEA and scheduled CEA on 02/07/24  Continue ASA and Brilinta daily    ______________________________________________________________________    Signed, Mathews Argyle, NP [facility]

## 2024-02-03 NOTE — Discharge Instructions (Addendum)
 You were seen in the emergency department for left-sided weakness.  You were evaluated by neurology and had a CAT scan of your head along with lab work.  This is likely related to your recent stroke.  There was no indication for any medication changes or admission to the hospital at this time.  Please follow-up with your vascular surgeon and neurology as an outpatient.  Return to the emergency department if any worsening or concerning symptoms

## 2024-02-03 NOTE — ED Provider Notes (Signed)
 [place] [facility] AT [facility] Provider Note   CSN: [id number] Arrival date & time: 02/03/24  1107     History  Chief Complaint  Patient presents with   Numbness    Jacob Avery is a 78 y.o. male.  He is brought to the emergency department by his wife.  They were driving here to go to preop when he endorsed worsening left arm numbness and weakness.  Unclear if he woke up like this.  He was recently admitted to the hospital for an acute stroke and he needs a carotid endarterectomy.  He had some residual weakness in his left hand on discharge.  He denies any headache chest pain shortness of breath.  The history is provided by the patient, the spouse and a relative.  Cerebrovascular Accident This is a recurrent problem. The current episode started 1 to 2 hours ago. The problem occurs constantly. The problem has not changed since onset.Pertinent negatives include no chest pain, no abdominal pain, no headaches and no shortness of breath. Nothing aggravates the symptoms. Nothing relieves the symptoms. He has tried nothing for the symptoms. The treatment provided no relief.       Home Medications Prior to Admission medications   Medication Sig Start Date End Date Taking? Authorizing Provider  Accu-Chek FastClix Lancets MISC  01/10/19   [provider]  ACCU-CHEK GUIDE test strip USE STRIP TO CHECK GLUCOSE THREE TIMES DAILY 02/08/19   [provider]  acetaminophen (TYLENOL) 650 MG CR tablet Take 1,300 mg by mouth in the morning and at bedtime.    [provider]  amLODipine (NORVASC) 10 MG tablet Take 10 mg by mouth in the morning.    [provider]  aspirin EC 81 MG tablet Take 81 mg by mouth in the morning.    [provider]  atorvastatin (LIPITOR) 80 MG tablet Take 80 mg by mouth in the morning.    [provider]  clopidogrel (PLAVIX) 75 MG tablet Take 75 mg by mouth in the morning.    [provider]   colchicine 0.6 MG tablet Take 0.6 mg by mouth daily as needed (gout). 02/24/11   [provider]  ezetimibe (ZETIA) 10 MG tablet Take 10 mg by mouth in the morning.    [provider]  ferrous sulfate 325 (65 FE) MG EC tablet Take 325 mg by mouth in the morning.    [provider]  HUMALOG KWIKPEN 100 UNIT/ML KwikPen Inject 0-8 Units into the skin 2 (two) times daily as needed. Sliding scale Take 2 units if greater then 150 10/01/20   [provider]  hydrALAZINE (APRESOLINE) 25 MG tablet Take 1 tablet (25 mg total) by mouth 3 (three) times daily. 08/24/23 08/18/24  Wendall Stade, MD  Insulin Pen Needle (B-D ULTRAFINE III SHORT PEN) 31G X 8 MM MISC use pen needs 4 times a day with insulin  (DX: ICD10: E11.51) 03/02/10   [provider]  Insulin Syringe-Needle U-100 31G X 5/16" 1 ML MISC use one needle once daily 11/23/10   [provider]  LANTUS SOLOSTAR 100 UNIT/ML Solostar Pen Inject 14 Units into the skin 2 (two) times daily. 12/14/16   [provider]  loratadine (CLARITIN) 10 MG tablet Take 1 tablet (10 mg total) by mouth daily. 01/31/24   Vassie Loll, MD  losartan-hydrochlorothiazide (HYZAAR) 100-25 MG per tablet Take 1 tablet by mouth in the morning. 08/17/14   [provider]  metoprolol  tartrate (LOPRESSOR) 25 MG tablet Take 25 mg by mouth 2 (two) times daily. 10/23/23   [provider]  Multiple Vitamins-Minerals (EMERGEN-C IMMUNE PLUS) PACK Take 1 packet by mouth daily as needed (immune support/fatigue).    [provider]  nitroGLYCERIN (NITROSTAT) 0.4 MG SL tablet Place 1 tablet (0.4 mg total) under the tongue every 5 (five) minutes as needed for chest pain. 06/28/18   Laqueta Linden, MD  omeprazole (PRILOSEC) 20 MG capsule Take 20 mg by mouth daily before breakfast. 08/24/21   [provider]  tamsulosin (FLOMAX) 0.4 MG CAPS capsule Take 0.4 mg by mouth at bedtime.    [provider]  ticagrelor (BRILINTA) 90 MG TABS tablet Take 1 tablet (90 mg total) by mouth 2 (two) times daily. 01/30/24   Vassie Loll, MD      Allergies    Patient has no known allergies.    Review of Systems   Review of Systems  Constitutional:  Negative for fever.  HENT:  Negative for sore throat.   Respiratory:  Negative for shortness of breath.   Cardiovascular:  Negative for chest pain.  Gastrointestinal:  Negative for abdominal pain.  Genitourinary:  Negative for dysuria.  Skin:  Negative for rash.  Neurological:  Positive for weakness and numbness. Negative for speech difficulty and headaches.    Physical Exam Updated Vital Signs BP (!) 159/70 (BP Location: Right Arm)   Pulse (!) 49   Temp 98.4 F (36.9 C)   Resp 18   Ht 5\' 11"  (1.803 m)   Wt 117.9 kg   SpO2 97%   BMI 36.26 kg/m  Physical Exam Vitals and nursing note reviewed.  Constitutional:      General: He is not in acute distress.    Appearance: Normal appearance. He is well-developed.  HENT:     Head: Normocephalic and atraumatic.  Eyes:     Conjunctiva/sclera: Conjunctivae normal.  Cardiovascular:     Rate and Rhythm: Normal rate and regular rhythm.     Heart sounds: No murmur heard. Pulmonary:     Effort: Pulmonary effort is normal. No respiratory distress.     Breath sounds: Normal breath sounds.  Abdominal:     Palpations: Abdomen is soft.     Tenderness: There is no abdominal tenderness.  Musculoskeletal:        General: No swelling.     Cervical back: Neck supple.  Skin:    General: Skin is warm and dry.     Capillary Refill: Capillary refill takes less than 2 seconds.  Neurological:     Mental Status: He is alert.     Sensory: Sensory deficit present.     Motor: Weakness present.     Comments: He has diminished grip on the left hand and some subjective decrease sensation on the left arm     ED Results / Procedures / Treatments   Labs (all labs ordered are listed, but only  abnormal results are displayed) Labs Reviewed  CBC - Abnormal; Notable for the following components:      Result Value   WBC 10.7 (*)    Hemoglobin 12.5 (*)    MCH 25.4 (*)    All other components within normal limits  URINALYSIS, ROUTINE W REFLEX MICROSCOPIC - Abnormal; Notable for the following components:   Specific Gravity, Urine 1.045 (*)    All other components within normal limits  DIFFERENTIAL - Abnormal; Notable for the following components:   Neutro Abs 8.5 (*)  All other components within normal limits  COMPREHENSIVE METABOLIC PANEL WITH GFR - Abnormal; Notable for the following components:   Glucose, Bld 128 (*)    AST 14 (*)    All other components within normal limits  CBG MONITORING, ED - Abnormal; Notable for the following components:   Glucose-Capillary 125 (*)    All other components within normal limits  I-STAT CHEM 8, ED - Abnormal; Notable for the following components:   Glucose, Bld 105 (*)    Calcium, Ion 0.87 (*)    Hemoglobin 12.2 (*)    HCT 36.0 (*)    All other components within normal limits  ETHANOL  PROTIME-INR  APTT  RAPID URINE DRUG SCREEN, HOSP PERFORMED    EKG EKG Interpretation Date/Time:  Friday February 03 2024 11:17:44 EDT Ventricular Rate:  51 PR Interval:  176 QRS Duration:  98 QT Interval:  478 QTC Calculation: 440 R Axis:   -22  Text Interpretation: Sinus bradycardia with Premature atrial complexes with Abberant conduction Moderate voltage criteria for LVH, may be normal variant ( R in aVL , Cornell product ) Borderline ECG When compared with ECG of 29-Jan-2024 15:30, new ectopy Confirmed by Meridee Score [id number]) on 02/03/2024 11:58:27 AM  Radiology CT ANGIO HEAD NECK W WO CM (CODE STROKE) Result Date: 02/03/2024 CLINICAL DATA:  Code stroke. 78 year old male right side weakness. Numerous embolic appearing right hemispheric infarcts on 01/29/2024. EXAM: CT ANGIOGRAPHY HEAD AND NECK WITH AND WITHOUT CONTRAST TECHNIQUE: Multidetector  CT imaging of the head and neck was performed using the standard protocol during bolus administration of intravenous contrast. Multiplanar CT image reconstructions and MIPs were obtained to evaluate the vascular anatomy. Carotid stenosis measurements (when applicable) are obtained utilizing NASCET criteria, using the distal internal carotid diameter as the denominator. RADIATION DOSE REDUCTION: This exam was performed according to the departmental dose-optimization program which includes automated exposure control, adjustment of the mA and/or kV according to patient size and/or use of iterative reconstruction technique. CONTRAST:  75mL OMNIPAQUE IOHEXOL 350 MG/ML SOLN COMPARISON:  Head CT today.  CTA head and neck 01/29/2024 FINDINGS: CTA NECK Skeleton: Absent dentition. No acute osseous abnormality identified. Upper chest: Emphysema. Other neck: Nonvascular neck soft tissue spaces are within normal limits. Aortic arch: Extensive Calcified aortic atherosclerosis. 3 vessel arch. Right carotid system: Brachiocephalic artery plaque and tortuous proximal right CCA without stenosis. Mid right CCA bulky soft, low-density plaque (series 5, image 111) with stable associated stenosis numerically estimated at 64 % with respect to the distal vessel. Soft plaque continues to the right carotid bifurcation. Critical stenosis of the right ECA origin but collateral enhancement. Additional right ICA bulb soft plaque, calcified plaque. Right ICA stenosis distal to the bulb due to calcified plaque numerically estimated at 85 % with respect to the distal vessel (series 6, image 186), stable. Right ICA remains patent to the skull base. Left carotid system: Proximal left CCA plaque and tortuosity without stenosis. Left ICA origin and bulb soft and calcified plaque. Less than 50 % stenosis with respect to the distal vessel. Tortuosity in the superior retropharyngeal space. Vertebral arteries: Right subclavian origin plaque without  stenosis. Calcified plaque at the right vertebral artery origin with severe proximal right vertebral stenosis (series 9, image 114), stable and that vessel is patent although hypoenhancing in the proximal V2 segment. Proximal left subclavian artery plaque without stenosis. Extensive proximal left vertebral atherosclerosis and stenosis series 9, image 90. The vessel remains patent although the proximal V2 segment is hypoenhancing.  Both vertebral arteries remain patent to the skull base. CTA HEAD Posterior circulation: Severe calcified plaque and stenosis proximal left V4 segment. The vessel remains patent to the vertebrobasilar junction. Right V4 segment moderate plaque and stenosis. Patent right vertebrobasilar junction. Patent basilar artery without stenosis. Patent right PICA, dominant appearing left AICA. Patent SCA origins. Fetal type bilateral PCA origins. Bilateral PCA branches are patent with mild irregularity. Anterior circulation: Left ICA siphon is patent with moderate calcified plaque, mild to moderate stenosis at the distal cavernous segment. Normal left posterior communicating artery origin. Right ICA siphon similar widespread calcified plaque. Moderate distal cavernous right ICA stenosis. Normal right posterior communicating artery origin. The ICA siphons are stable from last month. Patent carotid termini, MCA origins. Dominant left and diminutive or absent right ACA A1 again noted. Mild stenosis at the left ACA origin. Anterior communicating artery and bilateral ACA branches remain patent. Left MCA M1 segment and bifurcation are patent with mild irregularity. Left MCA branches are stable. Right MCA M1 segment is patent with mild tortuosity. Right MCA bifurcation is patent. Right MCA branches are stable with mild branch irregularity is seen on series 12, image 19. Venous sinuses: Patent. Anatomic variants: Fetal type PCA origins. Dominant left and diminutive right ACA A1 segments. Review of the MIP  images confirms the above findings IMPRESSION: 1. Negative for large vessel occlusion. 2. Positive for severe atherosclerosis in the head and neck, stable from the recent CTA 01/29/2024, most notable for: - Bulky low-density soft plaque Right CCA and High-grade cervical Right ICA stenosis numerically estimated at 85%. - Severe stenosis of the Bilateral Proximal Vertebral Arteries, also Left vertebral V4 segment. Both vertebral arteries and the posterior circulation remain patent. - Bilateral ICA siphon abundant calcified plaque. Up to Moderate bilateral distal cavernous ICA stenosis. 3. Extensive Aortic Atherosclerosis (ICD10-I70.0). Salient findings were communicated to Dr. Armanda Magic at 12:25 pm on 02/03/2024 by text page via the [facility] messaging system. Electronically Signed   By: Odessa Fleming M.D.   On: 02/03/2024 12:25   CT HEAD CODE STROKE WO CONTRAST Result Date: 02/03/2024 CLINICAL DATA:  Code stroke. 78 year old male right side weakness. Numerous embolic appearing right hemispheric infarcts on 01/29/2024. EXAM: CT HEAD WITHOUT CONTRAST TECHNIQUE: Contiguous axial images were obtained from the base of the skull through the vertex without intravenous contrast. RADIATION DOSE REDUCTION: This exam was performed according to the departmental dose-optimization program which includes automated exposure control, adjustment of the mA and/or kV according to patient size and/or use of iterative reconstruction technique. COMPARISON:  MRI brain and CTA head and neck 01/29/2024. FINDINGS: Brain: No midline shift, mass effect, or evidence of intracranial mass lesion. No acute intracranial hemorrhage identified. No ventriculomegaly. Chronic encephalomalacia in the right superior frontal pre motor area. Small chronic right cerebellar infarct. Chronic deep gray nuclei lacunar infarcts better demonstrated by MRI. Small cortically based infarcts seen last month on MRI remain largely occult by CT. Linear hypodensity posterior right  corona radiata on series 3, image 22 corresponds to 1 of those lesions. No new cortically based infarct is identified. Vascular: Calcified atherosclerosis at the skull base. No suspicious intracranial vascular hyperdensity. Skull: Intact.  No acute osseous abnormality identified. Sinuses/Orbits: Visualized paranasal sinuses and mastoids are stable and well aerated. Other: No gaze deviation. Visualized scalp soft tissues are within normal limits. ASPECTS [facility] Stroke Program Early CT Score) Total score (0-10 with 10 being normal): 10 IMPRESSION: 1. No acute cortically based infarct or acute intracranial hemorrhage identified. ASPECTS 10. 2.  Expected CT appearance of recent small right hemisphere infarcts. Underlying multifocal chronic ischemia. 3. These results were communicated to Dr. Armanda Magic at 12:04 pm on 02/03/2024 by text page via the [facility] messaging system. Electronically Signed   By: Odessa Fleming M.D.   On: 02/03/2024 12:04    Procedures Procedures    Medications Ordered in ED Medications  iohexol (OMNIPAQUE) 350 MG/ML injection 75 mL (75 mLs Intravenous Contrast Given 02/03/24 1206)    ED Course/ Medical Decision Making/ A&P Clinical Course as of 02/03/24 1757  Fri Feb 03, 2024  1214 Patient evaluated by neurology.  They do not feel TNK indicated especially with his recent neuro movement. [MB]  1219 Neurology feels this is worsening of his recent deficits.  They do not feel he needs admission from a neurology standpoint though.  They will continue to do neurochecks while his medical workup is ongoing. [MB]  1230 It looks like on discharge last week patient was taken off his Plavix and put on ticagrelor [MB]    Clinical Course User Index [MB] Terrilee Files, MD                                 Medical Decision Making Amount and/or Complexity of Data Reviewed Labs: ordered. Radiology: ordered.   This patient complains of worsening left arm weakness numbness; this involves an extensive  number of treatment Options and is a complaint that carries with it a high risk of complications and morbidity. The differential includes stroke, recrudescence of stroke, seizure, infection, hypoglycemia  I ordered, reviewed and interpreted labs, which included CBC unremarkable, chemistries with mildly elevated glucose, urinalysis without signs of infection I ordered imaging studies which included CT head, CT angio head and neck and I independently    visualized and interpreted imaging which showed unchanged atherosclerosis of carotids and head Additional history obtained from patient's wife and daughter Previous records obtained and reviewed recent discharge summary I consulted neurologist Dr. Armanda Magic and discussed lab and imaging findings and discussed disposition.  Cardiac monitoring reviewed, sinus bradycardia Social determinants considered, no significant barriers Critical Interventions: None  After the interventions stated above, I reevaluated the patient and found patient to be feeling somewhat better Admission and further testing considered, neurology does not feel this is an acute event and he is okay for outpatient follow-up.  He is already on dual antiplatelet.  Return instructions discussed         Final Clinical Impression(s) / ED Diagnoses Final diagnoses:  Left-sided weakness    Rx / DC Orders ED Discharge Orders     None         Terrilee Files, MD 02/03/24 1800

## 2024-02-03 NOTE — ED Triage Notes (Signed)
 Pt had a stroke on Saturday. Pt has numbness in fingers today. LSN 0800.

## 2024-02-03 NOTE — Code Documentation (Signed)
 Stroke Response Nurse Documentation Code Documentation  Kaitlin Ardito is a 78 y.o. male arriving to [facility]  via [employer] on 02/03/2024  with past medical hx of recent stroke with left sided deficits, sleep apnea, HTN, HLD, DM. On aspirin 81 mg daily and clopidogrel 75 mg daily. Code stroke was activated by ED.   Patient from Pre Op [place] where he was LKW at yesterday evening and now complaining of worsened Lt sided deficits.  Stroke team at the bedside upon activation. Labs drawn and patient cleared for CT by Dr. Charm Barges. Patient to CT with team. NIHSS 2, see documentation for details and code stroke times. Patient with left arm weakness and left leg weakness on exam. The following imaging was completed:  CT Head and CTA. Patient is not a candidate for IV Thrombolytic due to recent stroke, OOW. Patient is not a candidate for IR due to no LVO.   Care Plan:         q 2 NIHSS and VS        Stroke swallow screen prior to any pos   Bedside handoff with ED RN Angelica Chessman.    Argentina Donovan  Stroke Response RN

## 2024-02-06 NOTE — Progress Notes (Signed)
 Anesthesia Chart Review:   Case: [id number] Date/Time: 02/07/24 1109   Procedure: ENDARTERECTOMY, CAROTID (Right)   Anesthesia type: General   Diagnosis: Stenosis of right carotid artery [I65.21]   Pre-op diagnosis: symptomatic stenosis or R carotid artery   [facility]: [facility] ROOM 16 / [facility]   Surgeons: Maeola Harman, MD       DISCUSSION: Patient is a 78 year old male scheduled for the above procedure.  History includes former smoker, HTN, PVD (chronic aortic dissection/chronic bilateral EIA occlusion with collaterals, s/p left femoral endarterectomy, left axillobifemoral bypass 05/30/18; occluded left axillary bypass with patent FFBG 01/18/22 [place]), CKD (stage II), DM2, CVA (right MCA CVA 10/25/14 s/p tPA; 01/29/24), hypercholesterolemia, claustrophobia (with MRI), OSA (intolerant to CPAP), obesity.   [place] admission 01/29/24-01/30/24 for AMS x 1 day. Daughter also felt he seemed off balance and had some hypoglycemia at home. In ED HR 54 bpm, glucose 156, HS Troponin 11 x2, CXR without acute process. Brain MRI positive for numerous small acute right cerebral infarcts. Findings highly suspicious for embolic etiology.  He was out of the therapeutic window for tPA.  No definite cardiac source by TTE. 01/29/24 CT neck showed ~ 80% right ICA stenosis at the C2 level, severe left and moderate right intradural vertebral artery stenosis, ~ 50% left ICA stenosis, and suspected bilateral vertebral artery origins stenosis. Neurology felt CVA likey atheroembolic versus hypoperfusion from right carotid artery. He was referred to vascular surgery for symptomatic right ICA stenosis. He was already on Plavix and ASA at the time of his CVA, so switched to ASA 81 mg daily and Brilinta 90 mg for 30 days then ASA 81 mg daily.    He had vascular surgery evaluation by Dr. Randie Heinz on 02/01/24. He was still on Plavix and ASA (not Brilinta due to cost). CVA symptoms had improved. Carotid intervention discussion and above  procedure planned. He was to continue ASA and Plavix perioperatively. Since then, he presented to PAT on 02/03/24. Daughter concerned he had recurrent CVA symptoms, (worsening weakness and LUE numbness) so they were referred to the ED. Head CT expected findings from recent right brain infarcts, but no acute infarcts or intracranial hemorrhage noted. CTA head and neck with stable severe atherosclerosis in the head and neck with no large vessel occlusion. Neurology did not feel he required admission. Continue DAPT with plans for right ICA intervention.   Last cardiology visit with Dr. Eden Emms was on 04/29/23 for follow-up HTN, HLD, DM2, PVD, bradycardia, and presumed CAD based on abnormal Myoview from June 2019 showing inferoseptal MI with mild peri infarct ischemia. He was managed medically and went on to have a left femoral endarterectomy with left axillobifemoral bypass on 05/30/18. The left axillary bypass was occluded with patent FFBG by 01/18/22 [place]. At last visit, no chest pain. Note suggests he had been on and off beta blockers per primary care. Monitor in 2021 showed no high grade AV block but average HR in the 40's. It appears that Lopressor was discontinued again. Continue statin therapy. Advised follow-up with [facility] given occluded left axillary-femoral bypass, as he suspected patient may ultimately require further intervention--no rest pain or tissue loss at that time. Six month cardiology follow-up advised.   As above, June 2024 cardiology notes suggest metoprolol discontinued again due to bradycardia; However, by current medication list he is back on Lopressor 25 mg BID as of 10/22/24. Last few HRs recorded have been ~ 44-51 bpm.   Anesthesia team to evaluate on the day  of surgery.    VS: BP 134/87   Pulse (!) 44   Temp 36.8 C   Resp 18   Ht 5\' 11"  (1.803 m)   SpO2 98%   BMI 36.26 kg/m    PROVIDERS: [name], Adelfa Koh, MD is PCP  Charlton Haws, MD is cardiologist   LABS: Most recent labs  in [facility] include: Lab Results  Component Value Date   WBC 10.7 (H) 02/03/2024   HGB 12.2 (L) 02/03/2024   HCT 36.0 (L) 02/03/2024   PLT 364 02/03/2024   GLUCOSE 105 (H) 02/03/2024   CHOL 89 01/30/2024   TRIG 57 01/30/2024   HDL 30 (L) 01/30/2024   LDLCALC 48 01/30/2024   ALT 11 02/03/2024   AST 14 (L) 02/03/2024   NA 137 02/03/2024   K 4.0 02/03/2024   CL 106 02/03/2024   CREATININE 1.00 02/03/2024   BUN 22 02/03/2024   CO2 24 02/03/2024   INR 1.1 02/03/2024   HGBA1C 6.1 (H) 01/30/2024     IMAGES: CTA Head/Neck 02/03/24: IMPRESSION: 1. Negative for large vessel occlusion. 2. Positive for severe atherosclerosis in the head and neck, stable from the recent CTA 01/29/2024, most notable for: - Bulky low-density soft plaque Right CCA and High-grade cervical Right ICA stenosis numerically estimated at 85%. - Severe stenosis of the Bilateral Proximal Vertebral Arteries, also Left vertebral V4 segment. Both vertebral arteries and the posterior circulation remain patent. - Bilateral ICA siphon abundant calcified plaque. Up to Moderate bilateral distal cavernous ICA stenosis. 3. Extensive Aortic Atherosclerosis (ICD10-I70.0).   MRI 01/29/24: IMPRESSION: 1. Numerous small acute right cerebral infarcts. 2. Mild-to-moderate chronic small vessel ischemic disease.     EKG:  EKG 02/03/24 12:17 PM: Sinus bradycardia at 48 bpm Artifact ST-t wave abnormality Confirmed by Gerhard Munch [phone]) on 02/05/2024 9:58:00 AM  EKG 02/03/24 11:17: Sinus bradycardia at 51 bpm with Premature atrial complexes with Abberant conduction Moderate voltage criteria for LVH, may be normal variant ( R in aVL , Cornell product ) Borderline ECG When compared with ECG of 29-Jan-2024 15:30, new ectopy Confirmed by Meridee Score [phone]) on 02/03/2024 11:58:27 AM   CV: Echo 01/30/24: IMPRESSIONS   1. Left ventricular ejection fraction, by estimation, is 70 to 75%. The  left ventricle has hyperdynamic  function. The left ventricle has no  regional wall motion abnormalities. Left ventricular diastolic parameters  are consistent with Grade I diastolic  dysfunction (impaired relaxation).   2. Right ventricular systolic function is normal. The right ventricular  size is moderately enlarged. There is normal pulmonary artery systolic  pressure.   3. Left atrial size was mildly dilated.   4. The mitral valve is normal in structure. No evidence of mitral valve  regurgitation. No evidence of mitral stenosis.   5. The aortic valve is tricuspid. There is mild calcification of the  aortic valve. Aortic valve regurgitation is not visualized. No aortic  stenosis is present.   6. The inferior vena cava is normal in size with greater than 50%  respiratory variability, suggesting right atrial pressure of 3 mmHg.   7. Agitated saline contrast bubble study was negative, with no evidence  of any interatrial shunt at rest and inconclusive with Valsalva due to  suboptimal; image.   8. Increased flow velocities may be secondary to anemia, thyrotoxicosis,  hyperdynamic or high flow state.    ABIs 09/12/23: - Right ABIs appear essentially unchanged compared to prior study on  02/28/23. Left ABIs appear decreased compared to prior  study on 02/28/23.  Summary:  - Right: Resting right ankle-brachial index indicates severe right lower  extremity arterial disease. The right toe-brachial index is abnormal.  - Left: Resting left ankle-brachial index indicates moderate left lower  extremity arterial disease. The left toe-brachial index is abnormal.  - Occluded left axillary-femoral bypass, patent FFBG 01/18/22 [place].   Cardiac event monitor 01/10/20 - 01/24/20: Predominantly sinus bradycardia, average heart rate 40 bpm. One isolated episode of NSVT and 1 brief atrial run. PACs were also seen. No significant pauses and no high degree AV block.    Nuclear stress test 04/25/18: There was no ST segment deviation noted  during stress. Findings consistent with prior myocardial inferoseptal infarction with mild peri-infarct ischemia. This is a low risk study. The left ventricular ejection fraction is normal (55-65%). (Results reviewed by Dr. Purvis Sheffield who wrote, "Low risk study overall with small area of prior heart attack and mild blockage to be treated with medications. Can proceed with revascularization as planned.")   Past Medical History:  Diagnosis Date   Arthritis    Chronic kidney disease    Stage 2   CVA (cerebral vascular accident) (HCC) 10/2014    Late effect of cerebrovascular disease.  Mostly resolved, but can see difference in facial expressions.  tp-A, Plavix afterward   Diabetes mellitus without complication (HCC)    type II, controlled, with renal comps   Diabetic foot (HCC) 04/05/2018   Normal sensation, skin intact   Diverticulosis    ED (erectile dysfunction)    GI bleed 04/2017   Gout    History of claustrophobia    with MRI   Hx of adenomatous polyp of rectum 06/10/2017   Hypercholesterolemia    Hypertension    Long term (current) use of insulin (HCC)    Remains on basal/bolus therapy without hypoglycemia.   Microalbuminuria 2013   Morbid obesity (HCC)    Obesity    OSA (obstructive sleep apnea)    Intolerant to CPAP   Peripheral vascular disease (HCC)    Right calf pain 04/05/2018   Vertigo     Past Surgical History:  Procedure Laterality Date   ABDOMINAL AORTOGRAM N/A 04/13/2018   Procedure: ABDOMINAL AORTOGRAM;  Surgeon: Fransisco Hertz, MD;  Location: [facility] INVASIVE CV LAB;  Service: Cardiovascular;  Laterality: N/A;   APPLICATION OF WOUND VAC Bilateral 05/30/2018   Procedure: APPLICATION OF WOUND VAC;  Surgeon: Fransisco Hertz, MD;  Location: [facility] OR;  Service: Vascular;  Laterality: Bilateral;   AXILLARY-FEMORAL BYPASS GRAFT Left 05/30/2018   Procedure: BYPASS GRAFT LEFT AXILLA-BIFEMORAL WITH HEMO SHIELD GOLD VASCULAR GRAFTS;  Surgeon: Fransisco Hertz, MD;  Location: [facility] OR;   Service: Vascular;  Laterality: Left;   CATARACT EXTRACTION W/PHACO Right 07/03/2021   Procedure: CATARACT EXTRACTION PHACO AND INTRAOCULAR LENS PLACEMENT (IOC);  Surgeon: Fabio Pierce, MD;  Location: [facility] ORS;  Service: Ophthalmology;  Laterality: Right;  CDE 6.99   CATARACT EXTRACTION W/PHACO Left 07/17/2021   Procedure: CATARACT EXTRACTION PHACO AND INTRAOCULAR LENS PLACEMENT (IOC);  Surgeon: Fabio Pierce, MD;  Location: [facility] ORS;  Service: Ophthalmology;  Laterality: Left;  CDE 6.92   COLONOSCOPY WITH PROPOFOL N/A 06/08/2017   Procedure: COLONOSCOPY WITH PROPOFOL ( Ultra-Slim Scope);  Surgeon: Iva Boop, MD;  Location: [facility];  Service: Endoscopy;  Laterality: N/A;   ENDARTERECTOMY FEMORAL Bilateral 05/30/2018   Procedure: ENDARTERECTOMY FEMORAL LEFT;  Surgeon: Fransisco Hertz, MD;  Location: [facility] OR;  Service: Vascular;  Laterality: Bilateral;   INTRAOPERATIVE ARTERIOGRAM  Bilateral 05/30/2018   Procedure: INTRA OPERATIVE ARTERIOGRAM BILATERAL LOWER EXTREMITIES;  Surgeon: Fransisco Hertz, MD;  Location: [facility] OR;  Service: Vascular;  Laterality: Bilateral;   LOWER EXTREMITY ANGIOGRAPHY N/A 04/13/2018   Procedure: LOWER EXTREMITY ANGIOGRAPHY;  Surgeon: Fransisco Hertz, MD;  Location: [facility] INVASIVE CV LAB;  Service: Cardiovascular;  Laterality: N/A;  bilateral    UMBILICAL HERNIA REPAIR      MEDICATIONS:  Accu-Chek FastClix Lancets MISC   ACCU-CHEK GUIDE test strip   acetaminophen (TYLENOL) 650 MG CR tablet   amLODipine (NORVASC) 10 MG tablet   aspirin EC 81 MG tablet   atorvastatin (LIPITOR) 80 MG tablet   clopidogrel (PLAVIX) 75 MG tablet   colchicine 0.6 MG tablet   ezetimibe (ZETIA) 10 MG tablet   ferrous sulfate 325 (65 FE) MG EC tablet   HUMALOG KWIKPEN 100 UNIT/ML KwikPen   hydrALAZINE (APRESOLINE) 25 MG tablet   Insulin Pen Needle (B-D ULTRAFINE III SHORT PEN) 31G X 8 MM MISC   Insulin Syringe-Needle U-100 31G X 5/16" 1 ML MISC   LANTUS SOLOSTAR 100 UNIT/ML Solostar Pen    loratadine (CLARITIN) 10 MG tablet   losartan-hydrochlorothiazide (HYZAAR) 100-25 MG per tablet   metoprolol tartrate (LOPRESSOR) 25 MG tablet   Multiple Vitamins-Minerals (EMERGEN-C IMMUNE PLUS) PACK   nitroGLYCERIN (NITROSTAT) 0.4 MG SL tablet   omeprazole (PRILOSEC) 20 MG capsule   tamsulosin (FLOMAX) 0.4 MG CAPS capsule   ticagrelor (BRILINTA) 90 MG TABS tablet   No current facility-administered medications for this encounter.   He is taking Plavix and not Brilinta currently.   Shonna Chock, PA-C Surgical Short Stay/Anesthesiology [facility] Phone [phone] [facility] Phone [phone] 02/06/2024 11:08 AM

## 2024-02-06 NOTE — Anesthesia Preprocedure Evaluation (Deleted)
 Anesthesia Evaluation  Patient identified by MRN, date of birth, ID band Patient awake    Reviewed: Allergy & Precautions, NPO status , Patient's Chart, lab work & pertinent test results, reviewed documented beta blocker date and time   History of Anesthesia Complications Negative for: history of anesthetic complications  Airway Mallampati: III  TM Distance: >3 FB Neck ROM: Full    Dental  (+) Edentulous Upper, Edentulous Lower, Dental Advisory Given   Pulmonary neg shortness of breath, sleep apnea and Continuous Positive Airway Pressure Ventilation , neg COPD, neg recent URI, former smoker   breath sounds clear to auscultation       Cardiovascular hypertension, Pt. on medications and Pt. on home beta blockers (-) angina + Peripheral Vascular Disease  (-) Past MI  Rhythm:Regular  Left ventricle: The cavity size was normal. Wall thickness was    increased in a pattern of mild LVH. Systolic function was normal.    The estimated ejection fraction was in the range of 60% to 65%.    Wall motion was normal; there were no regional wall motion    abnormalities. The study is not technically sufficient to allow    evaluation of LV diastolic function.  - Aortic valve: Mildly to moderately calcified annulus. Trileaflet;    normal thickness leaflets. Valve area (VTI): 3.53 cm^2. Valve    area (Vmax): 3.95 cm^2. Valve area (Vmean): 3.75 cm^2.  - Left atrium: The atrium was mildly dilated.  - Atrial septum: The atrial septum is aneurysmal. No defect or    patent foramen ovale was identified.  - Pulmonary arteries: Systolic pressure was mildly increased. PA    peak pressure: 35 mm Hg (S).  - Technically adequate study.     Neuro/Psych Recent stroke with residual left hand symptoms.  CVA, Residual Symptoms  negative psych ROS   GI/Hepatic ,GERD  Medicated and Controlled,,  Endo/Other  diabetes, Insulin Dependent    Renal/GU Renal  diseaseLab Results      Component                Value               Date                      NA                       141                 02/07/2024                K                        4.1                 02/07/2024                CO2                      24                  02/03/2024                GLUCOSE                  143 (H)             02/07/2024  BUN                      25 (H)              02/07/2024                CREATININE               1.00                02/07/2024                CALCIUM                  9.0                 02/03/2024                GFRNONAA                 >60                 02/03/2024                Musculoskeletal  (+) Arthritis ,    Abdominal   Peds  Hematology  (+) Blood dyscrasia Lab Results      Component                Value               Date                      WBC                      10.7 (H)            02/03/2024                HGB                      14.3                02/07/2024                HCT                      42.0                02/07/2024                MCV                      80.1                02/03/2024                PLT                      364                 02/03/2024             plavix   Anesthesia Other Findings   Reproductive/Obstetrics                              Anesthesia Physical Anesthesia Plan  ASA: 3  Anesthesia Plan: General   Post-op Pain Management: Ofirmev IV (  intra-op)*   Induction: Intravenous  PONV Risk Score and Plan: 3 and Ondansetron and Dexamethasone  Airway Management Planned: Oral ETT  Additional Equipment: Arterial line  Intra-op Plan:   Post-operative Plan: Extubation in OR  Informed Consent: I have reviewed the patients History and Physical, chart, labs and discussed the procedure including the risks, benefits and alternatives for the proposed anesthesia with the patient or authorized representative who has indicated  his/her understanding and acceptance.     Dental advisory given  Plan Discussed with: CRNA  Anesthesia Plan Comments: (PAT note written 02/06/2024 by Shonna Chock, PA-C.  )        Anesthesia Quick Evaluation

## 2024-02-07 ENCOUNTER — Other Ambulatory Visit: Payer: Self-pay

## 2024-02-07 ENCOUNTER — Encounter (HOSPITAL_COMMUNITY)
Admission: RE | Disposition: A | Discharge status: Home Health | Payer: Self-pay | Source: Home / Self Care | Attending: Vascular Surgery

## 2024-02-07 ENCOUNTER — Inpatient Hospital Stay (HOSPITAL_COMMUNITY)
Admission: RE | Admit: 2024-02-07 | Discharge: 2024-02-10 | DRG: 038 | Disposition: A | Discharge status: Home Health | Attending: Vascular Surgery | Admitting: Vascular Surgery

## 2024-02-07 ENCOUNTER — Inpatient Hospital Stay (HOSPITAL_COMMUNITY): Payer: Self-pay | Admitting: Vascular Surgery

## 2024-02-07 ENCOUNTER — Encounter (HOSPITAL_COMMUNITY): Payer: Self-pay | Admitting: Vascular Surgery

## 2024-02-07 ENCOUNTER — Inpatient Hospital Stay (HOSPITAL_COMMUNITY)

## 2024-02-07 DIAGNOSIS — I69354 Hemiplegia and hemiparesis following cerebral infarction affecting left non-dominant side: Secondary | ICD-10-CM | POA: Diagnosis not present

## 2024-02-07 DIAGNOSIS — Z794 Long term (current) use of insulin: Secondary | ICD-10-CM | POA: Diagnosis not present

## 2024-02-07 DIAGNOSIS — I10 Essential (primary) hypertension: Secondary | ICD-10-CM | POA: Diagnosis present

## 2024-02-07 DIAGNOSIS — Z800 Family history of malignant neoplasm of digestive organs: Secondary | ICD-10-CM

## 2024-02-07 DIAGNOSIS — E119 Type 2 diabetes mellitus without complications: Secondary | ICD-10-CM | POA: Diagnosis present

## 2024-02-07 DIAGNOSIS — Z9889 Other specified postprocedural states: Principal | ICD-10-CM

## 2024-02-07 DIAGNOSIS — Z8249 Family history of ischemic heart disease and other diseases of the circulatory system: Secondary | ICD-10-CM

## 2024-02-07 DIAGNOSIS — Z91120 Patient's intentional underdosing of medication regimen due to financial hardship: Secondary | ICD-10-CM

## 2024-02-07 DIAGNOSIS — Z79899 Other long term (current) drug therapy: Secondary | ICD-10-CM

## 2024-02-07 DIAGNOSIS — I6521 Occlusion and stenosis of right carotid artery: Principal | ICD-10-CM | POA: Diagnosis present

## 2024-02-07 DIAGNOSIS — Z7902 Long term (current) use of antithrombotics/antiplatelets: Secondary | ICD-10-CM | POA: Diagnosis not present

## 2024-02-07 DIAGNOSIS — Z7982 Long term (current) use of aspirin: Secondary | ICD-10-CM

## 2024-02-07 DIAGNOSIS — Z87891 Personal history of nicotine dependence: Secondary | ICD-10-CM | POA: Diagnosis not present

## 2024-02-07 DIAGNOSIS — Z823 Family history of stroke: Secondary | ICD-10-CM

## 2024-02-07 DIAGNOSIS — T45526A Underdosing of antithrombotic drugs, initial encounter: Secondary | ICD-10-CM | POA: Diagnosis present

## 2024-02-07 DIAGNOSIS — Z833 Family history of diabetes mellitus: Secondary | ICD-10-CM | POA: Diagnosis not present

## 2024-02-07 DIAGNOSIS — Z820 Family history of epilepsy and other diseases of the nervous system: Secondary | ICD-10-CM | POA: Diagnosis not present

## 2024-02-07 DIAGNOSIS — G4733 Obstructive sleep apnea (adult) (pediatric): Secondary | ICD-10-CM | POA: Diagnosis not present

## 2024-02-07 DIAGNOSIS — I6529 Occlusion and stenosis of unspecified carotid artery: Principal | ICD-10-CM | POA: Diagnosis present

## 2024-02-07 LAB — POCT I-STAT, CHEM 8
BUN: 25 mg/dL — ABNORMAL HIGH (ref 8–23)
Calcium, Ion: 1.02 mmol/L — ABNORMAL LOW (ref 1.15–1.40)
Chloride: 107 mmol/L (ref 98–111)
Creatinine, Ser: 1 mg/dL (ref 0.61–1.24)
Glucose, Bld: 143 mg/dL — ABNORMAL HIGH (ref 70–99)
HCT: 42 % (ref 39.0–52.0)
Hemoglobin: 14.3 g/dL (ref 13.0–17.0)
Potassium: 4.1 mmol/L (ref 3.5–5.1)
Sodium: 141 mmol/L (ref 135–145)
TCO2: 27 mmol/L (ref 22–32)

## 2024-02-07 LAB — GLUCOSE, CAPILLARY
Glucose-Capillary: 154 mg/dL — ABNORMAL HIGH (ref 70–99)
Glucose-Capillary: 121 mg/dL — ABNORMAL HIGH (ref 70–99)
Glucose-Capillary: 109 mg/dL — ABNORMAL HIGH (ref 70–99)
Glucose-Capillary: 109 mg/dL — ABNORMAL HIGH (ref 70–99)

## 2024-02-07 SURGERY — ENDARTERECTOMY, CAROTID
Anesthesia: General | Laterality: Right

## 2024-02-07 MED ORDER — METOPROLOL TARTRATE 5 MG/5ML IV SOLN: 2.0000 mg | INTRAVENOUS | Status: DC | PRN

## 2024-02-07 MED ORDER — SODIUM CHLORIDE 0.9 % IV SOLN: INTRAVENOUS | Status: DC

## 2024-02-07 MED ORDER — INSULIN ASPART 100 UNIT/ML IJ SOLN: 0.0000 [IU] | INTRAMUSCULAR | Status: DC | PRN

## 2024-02-07 MED ORDER — COLCHICINE 0.6 MG PO TABS: 0.6000 mg | ORAL_TABLET | Freq: Every day | ORAL | Status: DC | PRN

## 2024-02-07 MED ORDER — HYDRALAZINE HCL 20 MG/ML IJ SOLN
20.0000 mg | Freq: Four times a day (QID) | INTRAMUSCULAR | Status: DC | PRN
  Administered 2024-02-07: 20 mg via INTRAVENOUS
  Filled 2024-02-07: qty 1

## 2024-02-07 MED ORDER — INSULIN ASPART 100 UNIT/ML IJ SOLN: 0.0000 [IU] | Freq: Three times a day (TID) | INTRAMUSCULAR | Status: DC

## 2024-02-07 MED ORDER — SALINE SPRAY 0.65 % NA SOLN: 1.0000 | NASAL | Status: DC | PRN

## 2024-02-07 MED ORDER — ACETAMINOPHEN 325 MG RE SUPP: 325.0000 mg | RECTAL | Status: DC | PRN

## 2024-02-07 MED ORDER — DOCUSATE SODIUM 100 MG PO CAPS: 100.0000 mg | ORAL_CAPSULE | Freq: Two times a day (BID) | ORAL | Status: DC

## 2024-02-07 MED ORDER — LOSARTAN POTASSIUM-HCTZ 100-25 MG PO TABS
1.0000 | ORAL_TABLET | Freq: Every morning | ORAL | Status: DC
Start: 2024-02-08 — End: 2024-02-07

## 2024-02-07 MED ORDER — CLOPIDOGREL BISULFATE 75 MG PO TABS
75.0000 mg | ORAL_TABLET | Freq: Every morning | ORAL | Status: DC
  Administered 2024-02-08 – 2024-02-10 (×3): 75 mg via ORAL
  Filled 2024-02-07 (×3): qty 1

## 2024-02-07 MED ORDER — ASPIRIN 81 MG PO TBEC
81.0000 mg | DELAYED_RELEASE_TABLET | Freq: Every morning | ORAL | Status: DC
  Administered 2024-02-08 – 2024-02-10 (×3): 81 mg via ORAL
  Filled 2024-02-07 (×3): qty 1

## 2024-02-07 MED ORDER — PANTOPRAZOLE SODIUM 40 MG PO TBEC: 40.0000 mg | DELAYED_RELEASE_TABLET | Freq: Every day | ORAL | Status: DC

## 2024-02-07 MED ORDER — SODIUM CHLORIDE 0.9 % IV SOLN: 250.0000 mL | INTRAVENOUS | Status: DC | PRN

## 2024-02-07 MED ORDER — LIDOCAINE HCL (PF) 1 % IJ SOLN
INTRAMUSCULAR | Status: AC
  Filled 2024-02-07: qty 5

## 2024-02-07 MED ORDER — CHLORHEXIDINE GLUCONATE CLOTH 2 % EX PADS: 6.0000 | MEDICATED_PAD | Freq: Once | CUTANEOUS | Status: DC

## 2024-02-07 MED ORDER — CHLORHEXIDINE GLUCONATE 0.12 % MT SOLN
15.0000 mL | Freq: Once | OROMUCOSAL | Status: AC
  Administered 2024-02-07: 15 mL via OROMUCOSAL
  Filled 2024-02-07: qty 15

## 2024-02-07 MED ORDER — ALUM & MAG HYDROXIDE-SIMETH 200-200-20 MG/5ML PO SUSP: 15.0000 mL | ORAL | Status: DC | PRN

## 2024-02-07 MED ORDER — ORAL CARE MOUTH RINSE: 15.0000 mL | Freq: Once | OROMUCOSAL | Status: AC

## 2024-02-07 MED ORDER — ATORVASTATIN CALCIUM 80 MG PO TABS
80.0000 mg | ORAL_TABLET | Freq: Every morning | ORAL | Status: DC
  Administered 2024-02-08 – 2024-02-10 (×3): 80 mg via ORAL
  Filled 2024-02-07 (×3): qty 1

## 2024-02-07 MED ORDER — PROPOFOL 10 MG/ML IV BOLUS
INTRAVENOUS | Status: AC
  Filled 2024-02-07: qty 20

## 2024-02-07 MED ORDER — MIDAZOLAM HCL 2 MG/2ML IJ SOLN
INTRAMUSCULAR | Status: AC
  Filled 2024-02-07: qty 2

## 2024-02-07 MED ORDER — ZOLPIDEM TARTRATE 5 MG PO TABS
5.0000 mg | ORAL_TABLET | Freq: Every evening | ORAL | Status: DC | PRN
  Administered 2024-02-07 – 2024-02-09 (×3): 5 mg via ORAL
  Filled 2024-02-07 (×3): qty 1

## 2024-02-07 MED ORDER — ONDANSETRON HCL 4 MG/2ML IJ SOLN
4.0000 mg | Freq: Four times a day (QID) | INTRAMUSCULAR | Status: DC | PRN
Start: 2024-02-07 — End: 2024-02-07

## 2024-02-07 MED ORDER — GUAIFENESIN-DM 100-10 MG/5ML PO SYRP: 15.0000 mL | ORAL_SOLUTION | ORAL | Status: DC | PRN

## 2024-02-07 MED ORDER — POTASSIUM CHLORIDE CRYS ER 20 MEQ PO TBCR: 20.0000 meq | EXTENDED_RELEASE_TABLET | Freq: Once | ORAL | Status: DC

## 2024-02-07 MED ORDER — LABETALOL HCL 5 MG/ML IV SOLN: 10.0000 mg | INTRAVENOUS | Status: DC | PRN

## 2024-02-07 MED ORDER — CEFAZOLIN SODIUM-DEXTROSE 2-4 GM/100ML-% IV SOLN
2.0000 g | INTRAVENOUS | Status: DC
Start: 2024-02-07 — End: 2024-02-07
  Filled 2024-02-07: qty 100

## 2024-02-07 MED ORDER — ACETAMINOPHEN 325 MG PO TABS: 325.0000 mg | ORAL_TABLET | ORAL | Status: DC | PRN

## 2024-02-07 MED ORDER — SODIUM CHLORIDE 0.9% FLUSH
3.0000 mL | Freq: Two times a day (BID) | INTRAVENOUS | Status: DC
Start: 2024-02-07 — End: 2024-02-07

## 2024-02-07 MED ORDER — FENTANYL CITRATE (PF) 250 MCG/5ML IJ SOLN
INTRAMUSCULAR | Status: AC
  Filled 2024-02-07: qty 5

## 2024-02-07 MED ORDER — HEPARIN SODIUM (PORCINE) 1000 UNIT/ML IJ SOLN: 1000.0000 [IU] | Freq: Once | INTRAMUSCULAR | Status: DC

## 2024-02-07 MED ORDER — TAMSULOSIN HCL 0.4 MG PO CAPS
0.4000 mg | ORAL_CAPSULE | Freq: Every day | ORAL | Status: DC
  Administered 2024-02-07 – 2024-02-09 (×3): 0.4 mg via ORAL
  Filled 2024-02-07 (×3): qty 1

## 2024-02-07 MED ORDER — HYDRALAZINE HCL 20 MG/ML IJ SOLN: 5.0000 mg | INTRAMUSCULAR | Status: DC | PRN

## 2024-02-07 MED ORDER — HYDROCHLOROTHIAZIDE 25 MG PO TABS
25.0000 mg | ORAL_TABLET | Freq: Every day | ORAL | Status: DC
  Administered 2024-02-08 – 2024-02-10 (×3): 25 mg via ORAL
  Filled 2024-02-07 (×3): qty 1

## 2024-02-07 MED ORDER — FERROUS SULFATE 325 (65 FE) MG PO TABS
325.0000 mg | ORAL_TABLET | Freq: Every morning | ORAL | Status: DC
  Administered 2024-02-08 – 2024-02-10 (×3): 325 mg via ORAL
  Filled 2024-02-07 (×3): qty 1

## 2024-02-07 MED ORDER — NITROGLYCERIN 0.4 MG SL SUBL: 0.4000 mg | SUBLINGUAL_TABLET | SUBLINGUAL | Status: DC | PRN

## 2024-02-07 MED ORDER — EZETIMIBE 10 MG PO TABS
10.0000 mg | ORAL_TABLET | Freq: Every morning | ORAL | Status: DC
  Administered 2024-02-08 – 2024-02-10 (×3): 10 mg via ORAL
  Filled 2024-02-07 (×3): qty 1

## 2024-02-07 MED ORDER — LORATADINE 10 MG PO TABS
10.0000 mg | ORAL_TABLET | Freq: Every day | ORAL | Status: DC
  Administered 2024-02-08 – 2024-02-10 (×3): 10 mg via ORAL
  Filled 2024-02-07 (×3): qty 1

## 2024-02-07 MED ORDER — PHENOL 1.4 % MT LIQD: 1.0000 | OROMUCOSAL | Status: DC | PRN

## 2024-02-07 MED ORDER — HYDRALAZINE HCL 25 MG PO TABS
25.0000 mg | ORAL_TABLET | Freq: Three times a day (TID) | ORAL | Status: DC
  Administered 2024-02-07 – 2024-02-10 (×8): 25 mg via ORAL
  Filled 2024-02-07 (×8): qty 1

## 2024-02-07 MED ORDER — LACTATED RINGERS IV SOLN: INTRAVENOUS | Status: DC

## 2024-02-07 MED ORDER — METOPROLOL TARTRATE 25 MG PO TABS
25.0000 mg | ORAL_TABLET | Freq: Two times a day (BID) | ORAL | Status: DC
  Administered 2024-02-07 – 2024-02-08 (×3): 25 mg via ORAL
  Filled 2024-02-07 (×5): qty 1

## 2024-02-07 MED ORDER — HEPARIN 6000 UNIT IRRIGATION SOLUTION
Status: AC
  Filled 2024-02-07: qty 500

## 2024-02-07 MED ORDER — AMLODIPINE BESYLATE 10 MG PO TABS
10.0000 mg | ORAL_TABLET | Freq: Every morning | ORAL | Status: DC
  Administered 2024-02-08 – 2024-02-10 (×3): 10 mg via ORAL
  Filled 2024-02-07 (×3): qty 1

## 2024-02-07 MED ORDER — LOSARTAN POTASSIUM 50 MG PO TABS
100.0000 mg | ORAL_TABLET | Freq: Every day | ORAL | Status: DC
  Administered 2024-02-08 – 2024-02-10 (×3): 100 mg via ORAL
  Filled 2024-02-07 (×3): qty 2

## 2024-02-07 MED ORDER — SODIUM CHLORIDE 0.9% FLUSH
3.0000 mL | INTRAVENOUS | Status: DC | PRN
Start: 2024-02-07 — End: 2024-02-07

## 2024-02-07 MED ORDER — HEPARIN SODIUM (PORCINE) 1000 UNIT/ML IJ SOLN
INTRAMUSCULAR | Status: AC
Start: 2024-02-07 — End: ?
  Filled 2024-02-07: qty 1

## 2024-02-07 NOTE — Progress Notes (Signed)
 Patient arrived from short stay to 4E23 A&O x4 and INAD.  CHG bath and skin assessment completed.  Telemetry monitor applied and CCMD notified.  Patient oriented to unit and room to include call light and phone. BP 220/85; HR 50.  MD made aware.  New orders received and meds administered.

## 2024-02-07 NOTE — Anesthesia Procedure Notes (Signed)
 Arterial Line Insertion Start/End4/06/2024 10:50 AM Performed by: Bishop Limbo, CRNA, CRNA  Patient location: Pre-op. Preanesthetic checklist: patient identified, IV checked, site marked, risks and benefits discussed, surgical consent, monitors and equipment checked, pre-op evaluation, timeout performed and anesthesia consent Lidocaine 1% used for infiltration Right, radial was placed Catheter size: 20 G Hand hygiene performed  and maximum sterile barriers used   Attempts: 2 Procedure performed without using ultrasound guided technique. Following insertion, dressing applied and Biopatch. Post procedure assessment: normal and unchanged

## 2024-02-07 NOTE — Interval H&P Note (Signed)
 History and Physical Interval Note:  02/07/2024 11:00 AM  Jacob Avery  has presented today for surgery, with the diagnosis of symptomatic stenosis or R carotid artery.  The various methods of treatment have been discussed with the patient and family. After consideration of risks, benefits and other options for treatment, the patient has consented to  Procedure(s): ENDARTERECTOMY, CAROTID (Right) as a surgical intervention.  The patient's history has been reviewed, patient examined, no change in status, stable for surgery.  I have reviewed the patient's chart and labs.  Questions were answered to the patient's satisfaction.     Lemar Livings

## 2024-02-07 NOTE — Progress Notes (Signed)
    Patient presented for right carotid endarterectomy with recent history of stroke and again with at least 1 episode of TIA possibly 2 additional TIAs.  Initially we are planning for carotid endarterectomy today but unfortunately I was preoccupied by an emergency in the operating room until about 5 PM.  The patient was offered procedure with my partner earlier in the day but declined.  We have admitted the patient, placed on heparin drip and he is scheduled for right carotid endarterectomy with me on Thursday, 02-09-2024.  Maecyn Panning C. Randie Heinz, MD [facility] Office: [phone] Pager: [phone]

## 2024-02-07 NOTE — Interval H&P Note (Signed)
 History and Physical Interval Note:  02/07/2024 10:53 AM  Jacob Avery  has presented today for surgery, with the diagnosis of symptomatic stenosis or R carotid artery.  The various methods of treatment have been discussed with the patient and family. After consideration of risks, benefits and other options for treatment, the patient has consented to  Procedure(s): ENDARTERECTOMY, CAROTID (Right) as a surgical intervention.  The patient's history has been reviewed, patient examined, no change in status, stable for surgery.  I have reviewed the patient's chart and labs.  Questions were answered to the patient's satisfaction.     Lemar Livings

## 2024-02-08 LAB — GLUCOSE, CAPILLARY
Glucose-Capillary: 143 mg/dL — ABNORMAL HIGH (ref 70–99)
Glucose-Capillary: 271 mg/dL — ABNORMAL HIGH (ref 70–99)
Glucose-Capillary: 133 mg/dL — ABNORMAL HIGH (ref 70–99)
Glucose-Capillary: 178 mg/dL — ABNORMAL HIGH (ref 70–99)

## 2024-02-08 LAB — HEPARIN LEVEL (UNFRACTIONATED): Heparin Unfractionated: 0.2 [IU]/mL — ABNORMAL LOW (ref 0.30–0.70)

## 2024-02-08 MED ORDER — HEPARIN (PORCINE) 25000 UT/250ML-% IV SOLN
1600.0000 [IU]/h | INTRAVENOUS | Status: DC
  Administered 2024-02-08: 1600 [IU]/h via INTRAVENOUS
  Administered 2024-02-08: 1500 [IU]/h via INTRAVENOUS
  Filled 2024-02-08 (×2): qty 250

## 2024-02-08 MED ORDER — INSULIN ASPART 100 UNIT/ML IJ SOLN
0.0000 [IU] | Freq: Three times a day (TID) | INTRAMUSCULAR | Status: AC
  Administered 2024-02-08: 8 [IU] via SUBCUTANEOUS
  Administered 2024-02-08: 2 [IU] via SUBCUTANEOUS

## 2024-02-08 MED ORDER — LORAZEPAM 0.5 MG PO TABS
0.5000 mg | ORAL_TABLET | Freq: Two times a day (BID) | ORAL | Status: DC | PRN
  Administered 2024-02-08: 0.5 mg via ORAL
  Filled 2024-02-08: qty 1

## 2024-02-08 NOTE — Progress Notes (Signed)
 PHARMACY - ANTICOAGULATION CONSULT NOTE  Pharmacy Consult for Heparin Indication:  TIA  No Known Allergies  Patient Measurements: Height: 5\' 11"  (180.3 cm) Weight: 117.9 kg (260 lb) IBW/kg (Calculated) : 75.3 HEPARIN DW (KG): 101.3  Vital Signs: Temp: 98.3 F (36.8 C) (04/09 1527) Temp Source: Oral (04/09 1527) BP: 140/93 (04/09 1527) Pulse Rate: 53 (04/09 1527)  Labs: Recent Labs    02/07/24 1011 02/08/24 1617  HGB 14.3  --   HCT 42.0  --   HEPARINUNFRC  --  0.20*  CREATININE 1.00  --     Estimated Creatinine Clearance: 79.5 mL/min (by C-G formula based on SCr of 1 mg/dL).   Assessment: 78 y.o. male with medical history significant for CVA and peripheral vascular disease who is admitted for symptomatic stenosis of right ICA. Pharmacy consulted to initiate IV heparin.  Initial heparin level came back subtherapeutic at 0.2, on 1500 units/hr. No s/sx of bleeding or infusion issues.   Goal of Therapy:  Heparin level 0.3-0.5 units/ml Monitor platelets by anticoagulation protocol: Yes   Plan:  Increase heparin infusion to 1600 units/hr Check heparin level in 8 hours and daily while on heparin Continue to monitor H&H and platelets  Thank you for allowing pharmacy to participate in this patient's care,  Sherron Monday, PharmD, BCCCP Clinical Pharmacist  Phone: [phone] 02/08/2024 5:13 PM  Please check AMION for all [facility] Pharmacy phone numbers After 10:00 PM, call Main Pharmacy [phone]

## 2024-02-08 NOTE — Plan of Care (Signed)

## 2024-02-08 NOTE — Progress Notes (Signed)
 PHARMACY - ANTICOAGULATION CONSULT NOTE  Pharmacy Consult for Heparin Indication:  TIA  No Known Allergies  Patient Measurements: Height: 5\' 11"  (180.3 cm) Weight: 117.9 kg (260 lb) IBW/kg (Calculated) : 75.3 HEPARIN DW (KG): 101.3  Vital Signs: Temp: 98.4 F (36.9 C) (04/09 0328) Temp Source: Oral (04/09 0328) BP: 149/47 (04/09 0328) Pulse Rate: 58 (04/09 0328)  Labs: Recent Labs    02/07/24 1011  HGB 14.3  HCT 42.0  CREATININE 1.00    Estimated Creatinine Clearance: 79.5 mL/min (by C-G formula based on SCr of 1 mg/dL).   Assessment: 78 y.o. male with medical history significant for CVA and peripheral vascular disease who is admitted for symptomatic stenosis of right ICA. Pharmacy consulted to initiate IV heparin.    Goal of Therapy:  Heparin level 0.3-0.5 units/ml Monitor platelets by anticoagulation protocol: Yes   Plan:  Start heparin infusion at 1500 units/hr Check heparin level in 8 hours and daily while on heparin Continue to monitor H&H and platelets  Thank you for allowing pharmacy to be a part of this patient's care.  Thelma Barge, PharmD, BCPS Clinical Pharmacist

## 2024-02-08 NOTE — Progress Notes (Addendum)
  Progress Note    02/08/2024 8:06 AM * Surgery not performed *  Subjective:  denies any further L hand weakness or other stroke like symptoms overnight   Vitals:   02/08/24 0700 02/08/24 0802  BP: (!) 171/76 (!) 150/48  Pulse:  (!) 45  Resp: 20   Temp:  98.5 F (36.9 C)  SpO2:     Physical Exam: Lungs:  non labored Extremities:  moving all ext well Neurologic: A&O  CBC    Component Value Date/Time   WBC 10.7 (H) 02/03/2024 1125   RBC 4.92 02/03/2024 1125   HGB 14.3 02/07/2024 1011   HCT 42.0 02/07/2024 1011   PLT 364 02/03/2024 1125   MCV 80.1 02/03/2024 1125   MCH 25.4 (L) 02/03/2024 1125   MCHC 31.7 02/03/2024 1125   RDW 15.3 02/03/2024 1125   LYMPHSABS 1.4 02/03/2024 1125   MONOABS 0.5 02/03/2024 1125   EOSABS 0.1 02/03/2024 1125   BASOSABS 0.1 02/03/2024 1125    BMET    Component Value Date/Time   NA 141 02/07/2024 1011   K 4.1 02/07/2024 1011   CL 107 02/07/2024 1011   CO2 24 02/03/2024 1146   GLUCOSE 143 (H) 02/07/2024 1011   BUN 25 (H) 02/07/2024 1011   CREATININE 1.00 02/07/2024 1011   CALCIUM 9.0 02/03/2024 1146   GFRNONAA >60 02/03/2024 1146   GFRAA >60 05/31/2018 0443    INR    Component Value Date/Time   INR 1.1 02/03/2024 1230     Intake/Output Summary (Last 24 hours) at 02/08/2024 0806 Last data filed at 02/07/2024 2200 Gross per 24 hour  Intake 150 ml  Output 550 ml  Net -400 ml     Assessment/Plan:  78 y.o. male with symptomatic R ICA stenosis  Surgery cancelled yesterday due to Dr. Randie Heinz being involved in other emergency surgery Start IV heparin for symptomatic R ICA stenosis Plan is for R carotid endarterectomy with Dr. Randie Heinz tomorrow Npo past midnight Consent signed on paper chart   Emilie Rutter, PA-C [facility] [phone] 02/08/2024 8:06 AM  I have independently interviewed and examined patient and agree with PA assessment and plan above.  Case was unfortunately canceled yesterday due to  timing.  He is on for right carotid endarterectomy first case tomorrow morning and we again discussed the perioperative plans as well as briefly again discussed the risk and benefits of the surgery.  He will be on heparin drip to reduce his risk of stroke overnight and this can be held on-call to the OR tomorrow.  Lorean Ekstrand C. Randie Heinz, MD [facility] [place] Office: [phone] Pager: [phone]

## 2024-02-09 ENCOUNTER — Encounter (HOSPITAL_COMMUNITY): Payer: Self-pay | Admitting: Vascular Surgery

## 2024-02-09 ENCOUNTER — Other Ambulatory Visit: Payer: Self-pay

## 2024-02-09 ENCOUNTER — Encounter (HOSPITAL_COMMUNITY)
Admission: RE | Disposition: A | Discharge status: Home Health | Payer: Self-pay | Source: Home / Self Care | Attending: Vascular Surgery

## 2024-02-09 ENCOUNTER — Inpatient Hospital Stay (HOSPITAL_COMMUNITY): Admitting: Anesthesiology

## 2024-02-09 DIAGNOSIS — I10 Essential (primary) hypertension: Secondary | ICD-10-CM | POA: Diagnosis not present

## 2024-02-09 DIAGNOSIS — E119 Type 2 diabetes mellitus without complications: Secondary | ICD-10-CM

## 2024-02-09 DIAGNOSIS — I6521 Occlusion and stenosis of right carotid artery: Secondary | ICD-10-CM | POA: Diagnosis not present

## 2024-02-09 DIAGNOSIS — G4733 Obstructive sleep apnea (adult) (pediatric): Secondary | ICD-10-CM

## 2024-02-09 HISTORY — PX: ENDARTERECTOMY: SHX5162

## 2024-02-09 HISTORY — PX: PATCH ANGIOPLASTY: SHX6230

## 2024-02-09 LAB — GLUCOSE, CAPILLARY
Glucose-Capillary: 177 mg/dL — ABNORMAL HIGH (ref 70–99)
Glucose-Capillary: 148 mg/dL — ABNORMAL HIGH (ref 70–99)
Glucose-Capillary: 243 mg/dL — ABNORMAL HIGH (ref 70–99)
Glucose-Capillary: 210 mg/dL — ABNORMAL HIGH (ref 70–99)
Glucose-Capillary: 213 mg/dL — ABNORMAL HIGH (ref 70–99)

## 2024-02-09 LAB — CBC
HCT: 40.6 % (ref 39.0–52.0)
Hemoglobin: 12.6 g/dL — ABNORMAL LOW (ref 13.0–17.0)
MCH: 25 pg — ABNORMAL LOW (ref 26.0–34.0)
MCHC: 31 g/dL (ref 30.0–36.0)
MCV: 80.4 fL (ref 80.0–100.0)
Platelets: 377 10*3/uL (ref 150–400)
RBC: 5.05 MIL/uL (ref 4.22–5.81)
RDW: 15.6 % — ABNORMAL HIGH (ref 11.5–15.5)
WBC: 13.2 10*3/uL — ABNORMAL HIGH (ref 4.0–10.5)
nRBC: 0 % (ref 0.0–0.2)

## 2024-02-09 LAB — HEPARIN LEVEL (UNFRACTIONATED): Heparin Unfractionated: 0.39 [IU]/mL (ref 0.30–0.70)

## 2024-02-09 SURGERY — ENDARTERECTOMY, CAROTID
Anesthesia: General | Site: Neck | Laterality: Right

## 2024-02-09 MED ORDER — CLEVIDIPINE BUTYRATE 0.5 MG/ML IV EMUL
INTRAVENOUS | Status: DC | PRN
  Administered 2024-02-09: 4 mg/h via INTRAVENOUS

## 2024-02-09 MED ORDER — SODIUM CHLORIDE 0.9 % IV SOLN: INTRAVENOUS | Status: AC

## 2024-02-09 MED ORDER — CEFAZOLIN SODIUM-DEXTROSE 2-3 GM-%(50ML) IV SOLR
INTRAVENOUS | Status: DC | PRN
  Administered 2024-02-09: 2 g via INTRAVENOUS

## 2024-02-09 MED ORDER — PROTAMINE SULFATE 10 MG/ML IV SOLN
INTRAVENOUS | Status: DC | PRN
Start: 2024-02-09 — End: 2024-02-09
  Administered 2024-02-09: 50 mg via INTRAVENOUS

## 2024-02-09 MED ORDER — METOPROLOL TARTRATE 5 MG/5ML IV SOLN: 2.0000 mg | INTRAVENOUS | Status: DC | PRN

## 2024-02-09 MED ORDER — ONDANSETRON HCL 4 MG/2ML IJ SOLN: 4.0000 mg | Freq: Four times a day (QID) | INTRAMUSCULAR | Status: DC | PRN

## 2024-02-09 MED ORDER — ACETAMINOPHEN 650 MG RE SUPP: 325.0000 mg | RECTAL | Status: DC | PRN

## 2024-02-09 MED ORDER — HEPARIN SODIUM (PORCINE) 1000 UNIT/ML IJ SOLN
INTRAMUSCULAR | Status: DC | PRN
  Administered 2024-02-09: 10000 [IU] via INTRAVENOUS

## 2024-02-09 MED ORDER — 0.9 % SODIUM CHLORIDE (POUR BTL) OPTIME
TOPICAL | Status: DC | PRN
  Administered 2024-02-09: 2000 mL

## 2024-02-09 MED ORDER — GUAIFENESIN-DM 100-10 MG/5ML PO SYRP: 15.0000 mL | ORAL_SOLUTION | ORAL | Status: DC | PRN

## 2024-02-09 MED ORDER — EPHEDRINE SULFATE-NACL 50-0.9 MG/10ML-% IV SOSY
PREFILLED_SYRINGE | INTRAVENOUS | Status: DC | PRN
Start: 2024-02-09 — End: 2024-02-09
  Administered 2024-02-09: 10 mg via INTRAVENOUS
  Administered 2024-02-09 (×2): 15 mg via INTRAVENOUS
  Administered 2024-02-09: 7.5 mg via INTRAVENOUS

## 2024-02-09 MED ORDER — POTASSIUM CHLORIDE CRYS ER 20 MEQ PO TBCR: 20.0000 meq | EXTENDED_RELEASE_TABLET | Freq: Every day | ORAL | Status: DC | PRN

## 2024-02-09 MED ORDER — MAGNESIUM SULFATE 2 GM/50ML IV SOLN: 2.0000 g | Freq: Every day | INTRAVENOUS | Status: DC | PRN

## 2024-02-09 MED ORDER — HYDRALAZINE HCL 20 MG/ML IJ SOLN: 5.0000 mg | INTRAMUSCULAR | Status: DC | PRN

## 2024-02-09 MED ORDER — OXYCODONE-ACETAMINOPHEN 5-325 MG PO TABS: 1.0000 | ORAL_TABLET | ORAL | Status: DC | PRN

## 2024-02-09 MED ORDER — PROPOFOL 10 MG/ML IV BOLUS
INTRAVENOUS | Status: AC
  Filled 2024-02-09: qty 20

## 2024-02-09 MED ORDER — PHENOL 1.4 % MT LIQD
1.0000 | OROMUCOSAL | Status: DC | PRN
  Administered 2024-02-09 – 2024-02-10 (×2): 1 via OROMUCOSAL
  Filled 2024-02-09: qty 177

## 2024-02-09 MED ORDER — LIDOCAINE-EPINEPHRINE (PF) 1 %-1:200000 IJ SOLN
INTRAMUSCULAR | Status: AC
  Filled 2024-02-09: qty 30

## 2024-02-09 MED ORDER — FENTANYL CITRATE (PF) 250 MCG/5ML IJ SOLN
INTRAMUSCULAR | Status: DC | PRN
Start: 2024-02-09 — End: 2024-02-09
  Administered 2024-02-09: 50 ug via INTRAVENOUS
  Administered 2024-02-09: 100 ug via INTRAVENOUS

## 2024-02-09 MED ORDER — LIDOCAINE 2% (20 MG/ML) 5 ML SYRINGE
INTRAMUSCULAR | Status: AC
  Filled 2024-02-09: qty 5

## 2024-02-09 MED ORDER — ROCURONIUM BROMIDE 10 MG/ML (PF) SYRINGE
PREFILLED_SYRINGE | INTRAVENOUS | Status: AC
  Filled 2024-02-09: qty 10

## 2024-02-09 MED ORDER — SUCCINYLCHOLINE CHLORIDE 200 MG/10ML IV SOSY
PREFILLED_SYRINGE | INTRAVENOUS | Status: AC
  Filled 2024-02-09: qty 10

## 2024-02-09 MED ORDER — EPHEDRINE 5 MG/ML INJ
INTRAVENOUS | Status: AC
  Filled 2024-02-09: qty 5

## 2024-02-09 MED ORDER — OXYCODONE HCL 5 MG/5ML PO SOLN: 5.0000 mg | Freq: Once | ORAL | Status: DC | PRN

## 2024-02-09 MED ORDER — LABETALOL HCL 5 MG/ML IV SOLN: 10.0000 mg | INTRAVENOUS | Status: DC | PRN

## 2024-02-09 MED ORDER — LIDOCAINE 2% (20 MG/ML) 5 ML SYRINGE
INTRAMUSCULAR | Status: DC | PRN
  Administered 2024-02-09: 40 mg via INTRAVENOUS

## 2024-02-09 MED ORDER — INSULIN ASPART 100 UNIT/ML IJ SOLN
0.0000 [IU] | Freq: Three times a day (TID) | INTRAMUSCULAR | Status: DC
  Administered 2024-02-10: 3 [IU] via SUBCUTANEOUS
  Administered 2024-02-10: 5 [IU] via SUBCUTANEOUS

## 2024-02-09 MED ORDER — LABETALOL HCL 5 MG/ML IV SOLN
INTRAVENOUS | Status: DC | PRN
  Administered 2024-02-09 (×2): 10 mg via INTRAVENOUS

## 2024-02-09 MED ORDER — MORPHINE SULFATE (PF) 2 MG/ML IV SOLN: 2.0000 mg | INTRAVENOUS | Status: DC | PRN

## 2024-02-09 MED ORDER — DEXAMETHASONE SODIUM PHOSPHATE 10 MG/ML IJ SOLN
INTRAMUSCULAR | Status: DC | PRN
  Administered 2024-02-09: 4 mg via INTRAVENOUS

## 2024-02-09 MED ORDER — ALUM & MAG HYDROXIDE-SIMETH 200-200-20 MG/5ML PO SUSP: 15.0000 mL | ORAL | Status: DC | PRN

## 2024-02-09 MED ORDER — FENTANYL CITRATE (PF) 250 MCG/5ML IJ SOLN
INTRAMUSCULAR | Status: AC
  Filled 2024-02-09: qty 5

## 2024-02-09 MED ORDER — PHENYLEPHRINE HCL-NACL 20-0.9 MG/250ML-% IV SOLN
INTRAVENOUS | Status: DC | PRN
  Administered 2024-02-09: 50 ug/min via INTRAVENOUS

## 2024-02-09 MED ORDER — LIDOCAINE HCL (PF) 1 % IJ SOLN
INTRAMUSCULAR | Status: AC
  Filled 2024-02-09: qty 30

## 2024-02-09 MED ORDER — PANTOPRAZOLE SODIUM 40 MG PO TBEC
40.0000 mg | DELAYED_RELEASE_TABLET | Freq: Every day | ORAL | Status: DC
  Administered 2024-02-09 – 2024-02-10 (×2): 40 mg via ORAL
  Filled 2024-02-09 (×2): qty 1

## 2024-02-09 MED ORDER — DEXAMETHASONE SODIUM PHOSPHATE 10 MG/ML IJ SOLN
INTRAMUSCULAR | Status: AC
  Filled 2024-02-09: qty 1

## 2024-02-09 MED ORDER — ONDANSETRON HCL 4 MG/2ML IJ SOLN
INTRAMUSCULAR | Status: DC | PRN
Start: 2024-02-09 — End: 2024-02-09
  Administered 2024-02-09: 4 mg via INTRAVENOUS

## 2024-02-09 MED ORDER — PHENYLEPHRINE 80 MCG/ML (10ML) SYRINGE FOR IV PUSH (FOR BLOOD PRESSURE SUPPORT)
PREFILLED_SYRINGE | INTRAVENOUS | Status: AC
  Filled 2024-02-09: qty 10

## 2024-02-09 MED ORDER — OXYCODONE HCL 5 MG PO TABS: 5.0000 mg | ORAL_TABLET | Freq: Once | ORAL | Status: DC | PRN

## 2024-02-09 MED ORDER — FENTANYL CITRATE (PF) 100 MCG/2ML IJ SOLN: 25.0000 ug | INTRAMUSCULAR | Status: DC | PRN

## 2024-02-09 MED ORDER — ONDANSETRON HCL 4 MG/2ML IJ SOLN
INTRAMUSCULAR | Status: AC
  Filled 2024-02-09: qty 2

## 2024-02-09 MED ORDER — HEPARIN 6000 UNIT IRRIGATION SOLUTION
Status: AC
  Filled 2024-02-09: qty 500

## 2024-02-09 MED ORDER — CEFAZOLIN SODIUM-DEXTROSE 2-4 GM/100ML-% IV SOLN
2.0000 g | Freq: Three times a day (TID) | INTRAVENOUS | Status: AC
  Administered 2024-02-09 – 2024-02-10 (×2): 2 g via INTRAVENOUS
  Filled 2024-02-09 (×2): qty 100

## 2024-02-09 MED ORDER — SUGAMMADEX SODIUM 200 MG/2ML IV SOLN
INTRAVENOUS | Status: DC | PRN
  Administered 2024-02-09 (×2): 200 mg via INTRAVENOUS

## 2024-02-09 MED ORDER — HEPARIN 6000 UNIT IRRIGATION SOLUTION
Status: DC | PRN
  Administered 2024-02-09: 1

## 2024-02-09 MED ORDER — SODIUM CHLORIDE 0.9 % IV SOLN: INTRAVENOUS | Status: DC | PRN

## 2024-02-09 MED ORDER — GLYCOPYRROLATE PF 0.2 MG/ML IJ SOSY
PREFILLED_SYRINGE | INTRAMUSCULAR | Status: DC | PRN
  Administered 2024-02-09 (×2): .2 mg via INTRAVENOUS

## 2024-02-09 MED ORDER — SODIUM CHLORIDE 0.9 % IV SOLN: 500.0000 mL | Freq: Once | INTRAVENOUS | Status: DC | PRN

## 2024-02-09 MED ORDER — HEMOSTATIC AGENTS (NO CHARGE) OPTIME
TOPICAL | Status: DC | PRN
  Administered 2024-02-09: 1

## 2024-02-09 MED ORDER — PROPOFOL 10 MG/ML IV BOLUS
INTRAVENOUS | Status: DC | PRN
  Administered 2024-02-09: 130 mg via INTRAVENOUS
  Administered 2024-02-09: 30 mg via INTRAVENOUS
  Administered 2024-02-09: 40 mg via INTRAVENOUS

## 2024-02-09 MED ORDER — DOCUSATE SODIUM 100 MG PO CAPS
100.0000 mg | ORAL_CAPSULE | Freq: Every day | ORAL | Status: DC
  Administered 2024-02-10: 100 mg via ORAL
  Filled 2024-02-09: qty 1

## 2024-02-09 MED ORDER — PHENYLEPHRINE 80 MCG/ML (10ML) SYRINGE FOR IV PUSH (FOR BLOOD PRESSURE SUPPORT)
PREFILLED_SYRINGE | INTRAVENOUS | Status: DC | PRN
  Administered 2024-02-09: 80 ug via INTRAVENOUS
  Administered 2024-02-09 (×2): 160 ug via INTRAVENOUS
  Administered 2024-02-09: 120 ug via INTRAVENOUS
  Administered 2024-02-09: 80 ug via INTRAVENOUS
  Administered 2024-02-09: 160 ug via INTRAVENOUS
  Administered 2024-02-09 (×2): 80 ug via INTRAVENOUS
  Administered 2024-02-09: 120 ug via INTRAVENOUS
  Administered 2024-02-09: 80 ug via INTRAVENOUS
  Administered 2024-02-09: 40 ug via INTRAVENOUS

## 2024-02-09 MED ORDER — LACTATED RINGERS IV SOLN: INTRAVENOUS | Status: DC | PRN

## 2024-02-09 MED ORDER — ROCURONIUM BROMIDE 10 MG/ML (PF) SYRINGE
PREFILLED_SYRINGE | INTRAVENOUS | Status: DC | PRN
  Administered 2024-02-09: 10 mg via INTRAVENOUS
  Administered 2024-02-09: 70 mg via INTRAVENOUS
  Administered 2024-02-09: 30 mg via INTRAVENOUS
  Administered 2024-02-09: 10 mg via INTRAVENOUS
  Administered 2024-02-09: 20 mg via INTRAVENOUS

## 2024-02-09 MED ORDER — ACETAMINOPHEN 325 MG PO TABS: 325.0000 mg | ORAL_TABLET | ORAL | Status: DC | PRN

## 2024-02-09 SURGICAL SUPPLY — 43 items
BAG COUNTER SPONGE SURGICOUNT (BAG) ×1 IMPLANT
BAG DECANTER FOR FLEXI CONT (MISCELLANEOUS) ×1 IMPLANT
CANISTER SUCT 3000ML PPV (MISCELLANEOUS) ×1 IMPLANT
CATH ROBINSON RED A/P 18FR (CATHETERS) ×1 IMPLANT
CLIP LIGATING EXTRA MED SLVR (CLIP) ×1 IMPLANT
CLIP LIGATING EXTRA SM BLUE (MISCELLANEOUS) ×1 IMPLANT
COVER PROBE W GEL 5X96 (DRAPES) ×1 IMPLANT
COVER ULTRASOUND PROBE 36 ST (MISCELLANEOUS) IMPLANT
DERMABOND ADVANCED .7 DNX12 (GAUZE/BANDAGES/DRESSINGS) ×1 IMPLANT
ELECT REM PT RETURN 9FT ADLT (ELECTROSURGICAL) ×1 IMPLANT
ELECTRODE REM PT RTRN 9FT ADLT (ELECTROSURGICAL) ×1 IMPLANT
GLOVE BIO SURGEON STRL SZ7.5 (GLOVE) ×1 IMPLANT
GOWN STRL REUS W/ TWL LRG LVL3 (GOWN DISPOSABLE) ×2 IMPLANT
GOWN STRL REUS W/ TWL XL LVL3 (GOWN DISPOSABLE) ×1 IMPLANT
GRAFT VASC PATCH XENOSURE 1X14 (Vascular Products) IMPLANT
HEMOSTAT SNOW SURGICEL 2X4 (HEMOSTASIS) IMPLANT
INSERT FOGARTY SM (MISCELLANEOUS) IMPLANT
IV ADAPTER SYR DOUBLE MALE LL (MISCELLANEOUS) IMPLANT
KIT BASIN OR (CUSTOM PROCEDURE TRAY) ×1 IMPLANT
KIT SHUNT ARGYLE CAROTID ART 6 (VASCULAR PRODUCTS) ×1 IMPLANT
KIT TURNOVER KIT B (KITS) ×1 IMPLANT
LOOP VESSEL MINI RED (MISCELLANEOUS) IMPLANT
NDL HYPO 25GX1X1/2 BEV (NEEDLE) IMPLANT
NDL SPNL 20GX3.5 QUINCKE YW (NEEDLE) IMPLANT
NEEDLE HYPO 25GX1X1/2 BEV (NEEDLE) IMPLANT
NEEDLE SPNL 20GX3.5 QUINCKE YW (NEEDLE) IMPLANT
NS IRRIG 1000ML POUR BTL (IV SOLUTION) ×3 IMPLANT
PACK CAROTID (CUSTOM PROCEDURE TRAY) ×1 IMPLANT
PAD ARMBOARD POSITIONER FOAM (MISCELLANEOUS) ×2 IMPLANT
POSITIONER HEAD DONUT 9IN (MISCELLANEOUS) ×1 IMPLANT
POWDER SURGICEL 3.0 GRAM (HEMOSTASIS) IMPLANT
SHEATH ULTRASOUND LF (SHEATH) IMPLANT
STOPCOCK 4 WAY LG BORE MALE ST (IV SETS) IMPLANT
SUT ETHILON 3 0 PS 1 (SUTURE) IMPLANT
SUT MNCRL AB 4-0 PS2 18 (SUTURE) ×1 IMPLANT
SUT PROLENE 5 0 C 1 24 (SUTURE) IMPLANT
SUT PROLENE 6 0 BV (SUTURE) ×1 IMPLANT
SUT SILK 3-0 18XBRD TIE 12 (SUTURE) IMPLANT
SUT VIC AB 3-0 SH 27X BRD (SUTURE) ×1 IMPLANT
SYR CONTROL 10ML LL (SYRINGE) IMPLANT
TOWEL GREEN STERILE (TOWEL DISPOSABLE) ×1 IMPLANT
TUBING ART PRESS 48 MALE/FEM (TUBING) IMPLANT
WATER STERILE IRR 1000ML POUR (IV SOLUTION) ×1 IMPLANT

## 2024-02-09 NOTE — Progress Notes (Signed)
     Right neck incision soft without hematoma No tongue deviation or facial droop Tolerated small amount of PO's  CC: sore throat  Family in room  S/P right CEA for symptomatic carotid stenosis   Mosetta Pigeon PA-C

## 2024-02-09 NOTE — Anesthesia Procedure Notes (Signed)
 Procedure Name: Intubation Date/Time: 02/09/2024 7:52 AM  Performed by: Darlina Guys, CRNAPre-anesthesia Checklist: Patient identified, Emergency Drugs available, Suction available and Patient being monitored Patient Re-evaluated:Patient Re-evaluated prior to induction Oxygen Delivery Method: Circle System Utilized Preoxygenation: Pre-oxygenation with 100% oxygen Induction Type: IV induction Ventilation: Oral airway inserted - appropriate to patient size Laryngoscope Size: Mac and 4 Grade View: Grade I Tube type: Oral Tube size: 7.5 mm Number of attempts: 1 Airway Equipment and Method: Stylet and Oral airway Placement Confirmation: ETT inserted through vocal cords under direct vision, positive ETCO2 and breath sounds checked- equal and bilateral Secured at: 23 cm Tube secured with: Tape Dental Injury: Teeth and Oropharynx as per pre-operative assessment  Comments: Atraumatic induction/intubation. Edentulous. Oral mucosa as per preop.

## 2024-02-09 NOTE — Op Note (Signed)
 Patient name: Jacob Avery MRN: [id number] DOB: 15-Dec-1945 Sex: male  02/09/2024 Pre-operative Diagnosis: Symptomatic right ICA stenosis Post-operative diagnosis:  Same Surgeon:  Apolinar Junes C. Randie Heinz, MD Assistants: Carolynn Sayers, MD; Aggie Moats, [place] Procedure Performed:  Right carotid endarterectomy with 12 French shunt neuroprotection and bovine pericardial patch angioplasty  Indications: 78 year old male with history of right ICA stenosis with recent history of right MCA distribution stroke and subsequent 2 episodes of TIA.  He is now indicated for right carotid endarterectomy.   Given the complexity of the case,  the assistant was necessary in order to expedite the procedure and safely perform the technical aspects of the operation.  The assistant provided traction and countertraction to assist with exposure of the common carotid artery, external carotid artery, and internal carotid artery.  They also assisted with suture ligatures and dividing the facial vein and multiple small venous branches tethering the hypoglossal nerve. The assistant also played a critical role in placing the shunt safely.  In addition they were necessary to provide adequate traction and countertraction to perform a precise endarterectomy and precise closure.  These skills, especially following the Prolene suture for the anastomosis, could not have been adequately performed by a scrub tech assistant.    Findings: There was a thickened common carotid artery identified preoperatively with ultrasound and a very friable ruptured plaque lesion at the common carotid artery bifurcation.  There was a second area of calcified stenosis higher on the ICA and we were able to establish control of the artery distally to this.  After extensive endarterectomy we elected to tack the distal lesion as there was a large shelf but this did tack nicely.  We then performed bovine pericardial patch angioplasty.  During endarterectomy and patch  angioplasty we shunted with a 12 French shunt and flow was confirmed.  Upon awakening from anesthesia the patient was initially not responding to commands with his left upper and lower extremity and the carotid artery was evaluated the ultrasound at bedside and noted to be patent and he was transferred to the recovery area and after awakening from anesthesia fully he was answering questions appropriately and deliberately using his left upper and lower extremities.   Procedure:  The patient was identified in the holding area and taken to the operating room where he was placed upon the table and general anesthesia was induced.  He was sterilely prepped and draped in the neck and the chest in the usual fashion, antibiotics were administered and a timeout was called.  Ultrasound was used to identify the common carotid artery where this was normal and this was just a few centimeters above the clavicle and it appeared that we could identify the bifurcation as well as where the artery was normal just before the bend much higher.  We then made incision on the skin dissected down through the subcutaneous tissue and platysma identified the sternocleidomastoid and retracted this laterally.  When notified the internal jugular vein as well as the vagus and these were protected and we encircled the common carotid artery where it was noted to be normal by ultrasound and the patient was fully heparinized.  This was encircled with umbilical tape proximally and a Rummel tourniquet was placed.  We then dissected higher up identified the carotid artery bifurcation and there was external evidence of internal thrombosis.  We did our best to avoid touching this area and encircled the external carotid artery with a vessel loop.  We then divided the branches of  the facial vein identified the hypoglossal nerve and transected the ansa cervicalis.  Higher up there were small vein branches that were divided between clips and ties and  ultimately 1 was oversewn with 5-0 Prolene suture.  We identified the ICA where it appeared externally normal and encircled this with a vessel loop.  A 12 French shunt and bovine pericardial patch were then prepared.  The ACT was confirmed greater than 260.  We clamped the ICA followed by the common carotid artery and the external carotid artery.  We then opened the vessel longitudinally and identified the very friable plaque at the bifurcation as well as calcifications higher.  The 22 French shunt was placed distally allowed to backbleed and then placed proximally and flow was confirmed with Doppler.  We performed extensive endarterectomy including eversion of the external carotid artery and had strong backbleeding from there and flushed this with heparinized saline.  Distally we elected for tacking and given the thickness of the distal lesion we tacked with 6-0 Prolene suture and this required approximately 5 tacking sutures.  After that when the endarterectomy site was satisfactory we sewed a bovine pericardial patch in place with 6-0 Prolene suture.  Prior to completion of the anastomosis we removed the shunt and then allowed backbleeding and antegrade bleeding in all directions and thoroughly flushed the carotid bulb.  Upon completion we then released the clamp on the external carotid artery followed by the common carotid artery and after several cardiac cycles the internal carotid artery.  We did at the place a few 6-0 Prolene repair sutures.  There was very strong Doppler flow in both the external carotid artery and the internal carotid artery above the patch where there was low resistance in the internal carotid artery signal.  Satisfied with this we irrigated the wound and obtain hemostasis and administer 50 mg of protamine.  We then closed in layers of Vicryl Monocryl.  The patient was then awakened from anesthesia and was initially not following commands particularly on the left side.  Ultrasound used to  identify the right common and the internal carotid artery was also somewhat visualized and was noted to be patent without any evidence of thrombosis.  As such she was transferred to recovery area and after awakening more fully from anesthesia he was noted to be neurologically intact and moving all extremities to command.  All counts were correct at completion.  EBL: 200 cc    Mariany Mackintosh C. Randie Heinz, MD [facility] [place] Office: [phone] Pager: [phone]

## 2024-02-09 NOTE — OR Nursing (Signed)
 Case ended at 1109. Neuro checks performed as patient awakening. Patient showed no purposeful movements on the left side. Dr. Randie Heinz was paged to the room and assessed the patient. Dr. Randie Heinz told circulating RN to take patient PACU and call a Code Stroke. Patient left the OR at 1138 and was taken to PACU. Report was given to PACU RN upon arrival.

## 2024-02-09 NOTE — Anesthesia Procedure Notes (Signed)
 Arterial Line Insertion Start/End4/08/2024 8:29 AM, 02/09/2024 8:36 AM Performed by: Achille Rich, MD, Darlina Guys, CRNA, CRNA  Patient location: OR. Preanesthetic checklist: patient identified, IV checked, site marked, risks and benefits discussed, surgical consent, monitors and equipment checked, pre-op evaluation, timeout performed and anesthesia consent Right, radial was placed Catheter size: 20 G Hand hygiene performed , maximum sterile barriers used  and Seldinger technique used  Attempts: 1 Procedure performed using ultrasound guided technique. Ultrasound Notes:anatomy identified, needle tip was noted to be adjacent to the nerve/plexus identified and no ultrasound evidence of intravascular and/or intraneural injection Following insertion, Biopatch. Post procedure assessment: normal

## 2024-02-09 NOTE — Anesthesia Procedure Notes (Signed)
 Arterial Line Insertion Start/End4/08/2024 7:11 AM, 02/09/2024 7:18 AM Performed by: Achille Rich, MD, Darlina Guys, CRNA, CRNA  Patient location: Pre-op. Preanesthetic checklist: patient identified, IV checked, site marked, risks and benefits discussed, surgical consent, monitors and equipment checked, pre-op evaluation, timeout performed and anesthesia consent Lidocaine 1% used for infiltration Left, radial was placed Catheter size: 20 G Hand hygiene performed , maximum sterile barriers used  and Seldinger technique used  Attempts: 1 Procedure performed without using ultrasound guided technique. Following insertion, Biopatch. Post procedure assessment: normal  Patient tolerated the procedure well with no immediate complications.

## 2024-02-09 NOTE — Anesthesia Preprocedure Evaluation (Signed)
 Anesthesia Evaluation  Patient identified by MRN, date of birth, ID band Patient awake    Reviewed: Allergy & Precautions, H&P , NPO status , Patient's Chart, lab work & pertinent test results  Airway Mallampati: II   Neck ROM: full    Dental   Pulmonary sleep apnea , former smoker   breath sounds clear to auscultation       Cardiovascular hypertension, + Peripheral Vascular Disease   Rhythm:regular Rate:Normal     Neuro/Psych CVA    GI/Hepatic ,GERD  ,,  Endo/Other  diabetes, Type 2    Renal/GU      Musculoskeletal  (+) Arthritis ,    Abdominal   Peds  Hematology   Anesthesia Other Findings   Reproductive/Obstetrics                             Anesthesia Physical Anesthesia Plan  ASA: 3  Anesthesia Plan: General   Post-op Pain Management:    Induction: Intravenous  PONV Risk Score and Plan: 2 and Ondansetron, Dexamethasone, Midazolam and Treatment may vary due to age or medical condition  Airway Management Planned: Oral ETT  Additional Equipment: Arterial line  Intra-op Plan:   Post-operative Plan: Extubation in OR  Informed Consent: I have reviewed the patients History and Physical, chart, labs and discussed the procedure including the risks, benefits and alternatives for the proposed anesthesia with the patient or authorized representative who has indicated his/her understanding and acceptance.     Dental advisory given  Plan Discussed with: CRNA, Anesthesiologist and Surgeon  Anesthesia Plan Comments:        Anesthesia Quick Evaluation

## 2024-02-09 NOTE — Progress Notes (Signed)
  Progress Note    02/09/2024 7:25 AM * Day of Surgery *  Subjective:  no overnight issues  Vitals:   02/09/24 0338 02/09/24 0700  BP: (!) 150/99 (!) 168/68  Pulse: 61 (!) 45  Resp: 19 17  Temp: 98.2 F (36.8 C) 98.1 F (36.7 C)  SpO2: 95% 93%    Physical Exam: Aaox3 Non labored respirations Moving all 4 without limitations  CBC    Component Value Date/Time   WBC 13.2 (H) 02/09/2024 0334   RBC 5.05 02/09/2024 0334   HGB 12.6 (L) 02/09/2024 0334   HCT 40.6 02/09/2024 0334   PLT 377 02/09/2024 0334   MCV 80.4 02/09/2024 0334   MCH 25.0 (L) 02/09/2024 0334   MCHC 31.0 02/09/2024 0334   RDW 15.6 (H) 02/09/2024 0334   LYMPHSABS 1.4 02/03/2024 1125   MONOABS 0.5 02/03/2024 1125   EOSABS 0.1 02/03/2024 1125   BASOSABS 0.1 02/03/2024 1125    BMET    Component Value Date/Time   NA 141 02/07/2024 1011   K 4.1 02/07/2024 1011   CL 107 02/07/2024 1011   CO2 24 02/03/2024 1146   GLUCOSE 143 (H) 02/07/2024 1011   BUN 25 (H) 02/07/2024 1011   CREATININE 1.00 02/07/2024 1011   CALCIUM 9.0 02/03/2024 1146   GFRNONAA >60 02/03/2024 1146   GFRAA >60 05/31/2018 0443    INR    Component Value Date/Time   INR 1.1 02/03/2024 1230     Intake/Output Summary (Last 24 hours) at 02/09/2024 0725 Last data filed at 02/09/2024 0658 Gross per 24 hour  Intake 466.36 ml  Output --  Net 466.36 ml     Assessment:  78 y.o. male is here with symptomatic right ica stenosis  Plan: OR today for R CEA.   Beata Beason C. Randie Heinz, MD [facility] [place] Office: [phone] Pager: [phone]  02/09/2024 7:25 AM

## 2024-02-09 NOTE — Transfer of Care (Signed)
 Immediate Anesthesia Transfer of Care Note  Patient: Aaidyn San  Procedure(s) Performed: RIGHT CAROTID ENDARTERECTOMY WITH PATCH ANGIOPLASTY USING XENOSURE BIOLOGIC PATCH 1 CM x 14 CM (Right: Neck) ANGIOPLASTY, USING PATCH GRAFT (Right: Neck)  Patient Location: PACU  Anesthesia Type:General  Level of Consciousness: awake  Airway & Oxygen Therapy: Patient Spontanous Breathing and Patient connected to nasal cannula oxygen  Post-op Assessment: Report given to RN and Post -op Vital signs reviewed and stable  Post vital signs: Reviewed and stable  Last Vitals:  Vitals Value Taken Time  BP 154/70 02/09/24 1145  Temp    Pulse 46 02/09/24 1155  Resp 16 02/09/24 1155  SpO2 96 % 02/09/24 1155  Vitals shown include unfiled device data.  Last Pain:  Vitals:   02/09/24 0700  TempSrc:   PainSc: 0-No pain         Complications: No notable events documented.

## 2024-02-09 NOTE — Progress Notes (Signed)
 PHARMACY - ANTICOAGULATION CONSULT NOTE  Pharmacy Consult for Heparin Indication:  TIA  No Known Allergies  Patient Measurements: Height: 5\' 11"  (180.3 cm) Weight: 117.9 kg (260 lb) IBW/kg (Calculated) : 75.3 HEPARIN DW (KG): 101.3  Vital Signs: Temp: 98.2 F (36.8 C) (04/10 0338) Temp Source: Oral (04/10 0338) BP: 150/99 (04/10 0338) Pulse Rate: 61 (04/10 0338)  Labs: Recent Labs    02/07/24 1011 02/08/24 1617 02/09/24 0334  HGB 14.3  --  12.6*  HCT 42.0  --  40.6  PLT  --   --  377  HEPARINUNFRC  --  0.20* 0.39  CREATININE 1.00  --   --     Estimated Creatinine Clearance: 79.5 mL/min (by C-G formula based on SCr of 1 mg/dL).   Assessment: 78 y.o. male with medical history significant for CVA and peripheral vascular disease who is admitted for symptomatic stenosis of right ICA. Pharmacy consulted to initiate IV heparin.  heparin level came back therapeutic at 0.39, on 1600 units/hr. No s/sx of bleeding or infusion issues per RN. Hgb dropped from 14 > 12.6 and plts WNL  Goal of Therapy:  Heparin level 0.3-0.5 units/ml Monitor platelets by anticoagulation protocol: Yes   Plan:  Continue heparin infusion at 1600 units/hr Follow up anticoagulant plan s/p OR on 4/10 Continue to monitor H&H and platelets  Thank you for allowing pharmacy to participate in this patient's care,  Arabella Merles, PharmD. Clinical Pharmacist 02/09/2024 5:03 AM

## 2024-02-10 ENCOUNTER — Encounter (HOSPITAL_COMMUNITY): Payer: Self-pay | Admitting: Vascular Surgery

## 2024-02-10 ENCOUNTER — Other Ambulatory Visit: Payer: Self-pay

## 2024-02-10 ENCOUNTER — Other Ambulatory Visit (HOSPITAL_COMMUNITY): Payer: Self-pay

## 2024-02-10 LAB — CBC
HCT: 34.8 % — ABNORMAL LOW (ref 39.0–52.0)
Hemoglobin: 10.9 g/dL — ABNORMAL LOW (ref 13.0–17.0)
MCH: 25.2 pg — ABNORMAL LOW (ref 26.0–34.0)
MCHC: 31.3 g/dL (ref 30.0–36.0)
MCV: 80.6 fL (ref 80.0–100.0)
Platelets: 336 10*3/uL (ref 150–400)
RBC: 4.32 MIL/uL (ref 4.22–5.81)
RDW: 15.7 % — ABNORMAL HIGH (ref 11.5–15.5)
WBC: 11.9 10*3/uL — ABNORMAL HIGH (ref 4.0–10.5)
nRBC: 0 % (ref 0.0–0.2)

## 2024-02-10 LAB — GLUCOSE, CAPILLARY
Glucose-Capillary: 198 mg/dL — ABNORMAL HIGH (ref 70–99)
Glucose-Capillary: 228 mg/dL — ABNORMAL HIGH (ref 70–99)

## 2024-02-10 LAB — BASIC METABOLIC PANEL WITH GFR
Anion gap: 10 (ref 5–15)
BUN: 18 mg/dL (ref 8–23)
CO2: 23 mmol/L (ref 22–32)
Calcium: 8.6 mg/dL — ABNORMAL LOW (ref 8.9–10.3)
Chloride: 103 mmol/L (ref 98–111)
Creatinine, Ser: 1.08 mg/dL (ref 0.61–1.24)
GFR, Estimated: >60 mL/min (ref >60)
Glucose, Bld: 206 mg/dL — ABNORMAL HIGH (ref 70–99)
Potassium: 3.9 mmol/L (ref 3.5–5.1)
Sodium: 136 mmol/L (ref 135–145)

## 2024-02-10 LAB — LIPID PANEL
Cholesterol: 91 mg/dL (ref 0–200)
HDL: 27 mg/dL — ABNORMAL LOW (ref >40)
LDL Cholesterol: 48 mg/dL (ref 0–99)
Total CHOL/HDL Ratio: 3.4 ratio
Triglycerides: 81 mg/dL (ref <150)
VLDL: 16 mg/dL (ref 0–40)

## 2024-02-10 MED ORDER — PANTOPRAZOLE SODIUM 40 MG PO TBEC
40.0000 mg | DELAYED_RELEASE_TABLET | Freq: Every day | ORAL | 2 refills | Status: AC
  Filled 2024-02-10: qty 30, 30d supply, fill #0

## 2024-02-10 MED ORDER — OXYCODONE-ACETAMINOPHEN 5-325 MG PO TABS
1.0000 | ORAL_TABLET | Freq: Four times a day (QID) | ORAL | 0 refills | Status: AC | PRN
  Filled 2024-02-10: qty 8, 2d supply, fill #0

## 2024-02-10 NOTE — Evaluation (Signed)
 Physical Therapy Evaluation Patient Details Name: Jacob Avery MRN: [id number] DOB: 1946-08-06 Today's Date: 02/10/2024  History of Present Illness  Pt is a 78 y.o. male admitted 4/8 with dx of carotid stenosis. He underwent R endarterectomy 4/10. Recent R CVA (01/29/24) with admission to [facility]. PMH: OA, CKD, CVA 2015, DM, gout, HTN, PVD  Clinical Impression  PT eval complete. Pt required min assist bed mobility, min assist transfers, and CG/min assist amb 100' with RW. Plan is for d/c home today. Wife present and confirms ability to provide needed level of assist. Pt would benefit from HHPT/OT and RW. PT signing off.        If plan is discharge home, recommend the following: A little help with walking and/or transfers;A little help with bathing/dressing/bathroom;Assist for transportation;Assistance with cooking/housework;Help with stairs or ramp for entrance   Can travel by private vehicle        Equipment Recommendations Rolling walker (2 wheels)  Recommendations for Other Services       Functional Status Assessment Patient has had a recent decline in their functional status and demonstrates the ability to make significant improvements in function in a reasonable and predictable amount of time.     Precautions / Restrictions Precautions Precautions: Fall Recall of Precautions/Restrictions: Intact      Mobility  Bed Mobility Overal bed mobility: Needs Assistance Bed Mobility: Supine to Sit, Sit to Supine     Supine to sit: HOB elevated, Used rails, Supervision Sit to supine: Min assist   General bed mobility comments: increased time, assist with BLE back to bed    Transfers Overall transfer level: Needs assistance Equipment used: Rolling walker (2 wheels) Transfers: Sit to/from Stand Sit to Stand: Min assist           General transfer comment: assist to power up, increased time    Ambulation/Gait Ambulation/Gait assistance: Contact guard assist, Min assist Gait  Distance (Feet): 100 Feet Assistive device: Rolling walker (2 wheels) Gait Pattern/deviations: Decreased step length - left, Decreased weight shift to left Gait velocity: decreased Gait velocity interpretation: <1.31 ft/sec, indicative of household ambulator   General Gait Details: CGA for balance, min assist to maintain grip L hand  Stairs            Wheelchair Mobility     Tilt Bed    Modified Rankin (Stroke Patients Only)       Balance Overall balance assessment: Needs assistance Sitting-balance support: No upper extremity supported, Feet supported Sitting balance-Leahy Scale: Good     Standing balance support: No upper extremity supported, During functional activity, Bilateral upper extremity supported Standing balance-Leahy Scale: Fair                               Pertinent Vitals/Pain Pain Assessment Pain Assessment: No/denies pain    Home Living Family/patient expects to be discharged to:: Private residence Living Arrangements: Spouse/significant other Available Help at Discharge: Family;Available 24 hours/day Type of Home: House Home Access: Stairs to enter Entrance Stairs-Rails: Right;Left;Can reach both Entrance Stairs-Number of Steps: 3   Home Layout: Able to live on main level with bedroom/bathroom;Laundry or work area in [employer]: None      Prior Function Prior Level of Function : Independent/Modified Independent             Mobility Comments: independent prior to March 2025 CVA       Extremity/Trunk Assessment   Upper Extremity Assessment Upper  Extremity Assessment: LUE deficits/detail;Right hand dominant LUE Deficits / Details: 3+/5 to 4-/5 grossly graded; difficulty maintaining grip on RW LUE Sensation: WNL LUE Coordination: decreased fine motor;decreased gross motor    Lower Extremity Assessment Lower Extremity Assessment: Generalized weakness;LLE deficits/detail LLE Deficits / Details: grossly  4/5 LLE Coordination: decreased gross motor    Cervical / Trunk Assessment Cervical / Trunk Assessment: Normal  Communication   Communication Communication: No apparent difficulties    Cognition Arousal: Alert Behavior During Therapy: WFL for tasks assessed/performed                             Following commands: Intact       Cueing Cueing Techniques: Verbal cues     General Comments General comments (skin integrity, edema, etc.): VSS on RA    Exercises     Assessment/Plan    PT Assessment All further PT needs can be met in the next venue of care  PT Problem List Decreased strength;Decreased activity tolerance;Decreased balance;Decreased mobility;Decreased knowledge of use of DME       PT Treatment Interventions      PT Goals (Current goals can be found in the Care Plan section)  Acute Rehab PT Goals Patient Stated Goal: home PT Goal Formulation: All assessment and education complete, DC therapy    Frequency       Co-evaluation               AM-PAC PT "6 Clicks" Mobility  Outcome Measure Help needed turning from your back to your side while in a flat bed without using bedrails?: None Help needed moving from lying on your back to sitting on the side of a flat bed without using bedrails?: A Little Help needed moving to and from a bed to a chair (including a wheelchair)?: A Little Help needed standing up from a chair using your arms (e.g., wheelchair or bedside chair)?: A Little Help needed to walk in hospital room?: A Little Help needed climbing 3-5 steps with a railing? : A Lot 6 Click Score: 18    End of Session Equipment Utilized During Treatment: Gait belt Activity Tolerance: Patient tolerated treatment well Patient left: in bed;with call bell/phone within reach;with family/visitor present Nurse Communication: Mobility status PT Visit Diagnosis: Other abnormalities of gait and mobility (R26.89);Muscle weakness (generalized)  (M62.81);Difficulty in walking, not elsewhere classified (R26.2)    Time: 1206-1227 PT Time Calculation (min) (ACUTE ONLY): 21 min   Charges:   PT Evaluation $PT Eval Moderate Complexity: 1 Mod   PT General Charges $$ ACUTE PT VISIT: 1 Visit         Ferd Glassing., PT  Office # [phone]   Ilda Foil 02/10/2024, 12:45 PM

## 2024-02-10 NOTE — Progress Notes (Signed)
 PHARMACIST LIPID MONITORING   Jacob Avery is a 78 y.o. male admitted on 02/07/2024 with symptomatic right ICA stenosis s/p Right carotid endarterectomy.  Pharmacy has been consulted to optimize lipid-lowering therapy with the indication of secondary prevention for clinical ASCVD.  Recent Labs:  Lipid Panel (last 6 months):   Lab Results  Component Value Date   CHOL 91 02/10/2024   TRIG 81 02/10/2024   HDL 27 (L) 02/10/2024   CHOLHDL 3.4 02/10/2024   VLDL 16 02/10/2024   LDLCALC 48 02/10/2024    Hepatic function panel (last 6 months):   Lab Results  Component Value Date   AST 14 (L) 02/03/2024   ALT 11 02/03/2024   ALKPHOS 65 02/03/2024   BILITOT 0.7 02/03/2024    SCr (since admission):   Serum creatinine: 1.08 mg/dL 29/56/21 3086 Estimated creatinine clearance: 73.6 mL/min  Current therapy and lipid therapy tolerance Current lipid-lowering therapy: atorvastatin 80mg  + ezetimibe 10mg  Previous lipid-lowering therapies (if applicable): n/a Documented or reported allergies or intolerances to lipid-lowering therapies (if applicable): n/a  Assessment:   Patient agrees with changes to lipid-lowering therapy  Plan:    1.Statin intensity (high intensity recommended for all patients regardless of the LDL):  No statin changes. The patient is already on a high intensity statin.  2.Add ezetimibe (if any one of the following): already on this medication  3.Refer to lipid clinic:   No  4.Follow-up with:  Cardiology provider - Charlton Haws, MD  5.Follow-up labs after discharge:  No changes in lipid therapy, repeat a lipid panel in one year.     Thank you for allowing pharmacy to be a part of this patient's care.  Thelma Barge, PharmD, BCPS Clinical Pharmacist

## 2024-02-10 NOTE — Progress Notes (Addendum)
  Progress Note    02/10/2024 6:41 AM 1 Day Post-Op  Subjective:  says he has sore throat but no trouble swallowing.  Says he is moving everything as he was before surgery.  Afebrile HR 40's-50's SB 120's-160's systolic 98% 2LO2NC  Gtts:  none  Vitals:   02/10/24 0000 02/10/24 0400  BP: (!) 139/42 (!) 138/43  Pulse: (!) 50 (!) 50  Resp: 18 19  Temp: 98.2 F (36.8 C) 98.1 F (36.7 C)  SpO2: 97% 96%     Physical Exam: Neuro:  left hand grip < right Lungs:  non labored Incision:  clean and dry  CBC    Component Value Date/Time   WBC 11.9 (H) 02/10/2024 0500   RBC 4.32 02/10/2024 0500   HGB 10.9 (L) 02/10/2024 0500   HCT 34.8 (L) 02/10/2024 0500   PLT 336 02/10/2024 0500   MCV 80.6 02/10/2024 0500   MCH 25.2 (L) 02/10/2024 0500   MCHC 31.3 02/10/2024 0500   RDW 15.7 (H) 02/10/2024 0500   LYMPHSABS 1.4 02/03/2024 1125   MONOABS 0.5 02/03/2024 1125   EOSABS 0.1 02/03/2024 1125   BASOSABS 0.1 02/03/2024 1125    BMET    Component Value Date/Time   NA 136 02/10/2024 0500   K 3.9 02/10/2024 0500   CL 103 02/10/2024 0500   CO2 23 02/10/2024 0500   GLUCOSE 206 (H) 02/10/2024 0500   BUN 18 02/10/2024 0500   CREATININE 1.08 02/10/2024 0500   CALCIUM 8.6 (L) 02/10/2024 0500   GFRNONAA >60 02/10/2024 0500   GFRAA >60 05/31/2018 0443     Intake/Output Summary (Last 24 hours) at 02/10/2024 0641 Last data filed at 02/10/2024 1308 Gross per 24 hour  Intake 768.42 ml  Output 875 ml  Net -106.58 ml     Assessment/Plan:  This is a 78 y.o. male who is s/p right CEA  1 Day Post-Op  -pt is doing well this am. -pt neuro exam is in tact -pt has not ambulated in the halls but has walked to the bathroom -pt has voided -f/u with [facility] in 2-3 weeks on Dr. Randie Heinz clinic day. -he has not received any narcotic pain medication.  Discussed with him that I would send a few tablets to his pharmacy but if he does not need them, he does not need to pick them up.  -continue  asa/statin   Doreatha Massed, PA-C [facility] [phone]  I have independently interviewed and examined patient and agree with PA assessment and plan above.  Progressing well from right CEA.  Stable left hand weakness.  Okay for discharge today.  Acxel Dingee C. Randie Heinz, MD [facility] [place] Office: [phone] Pager: [phone]

## 2024-02-10 NOTE — TOC Transition Note (Signed)
 Transition of Care (TOC) - Discharge Note Donn Pierini RN, BSN Transitions of Care Unit 4E- RN Case Manager See Treatment Team for direct phone #   Patient Details  Name: Jacob Avery MRN: [id number] Date of Birth: [birth year]  Transition of Care [facility]) CM/SW Contact:  Darrold Span, RN Phone Number: 02/10/2024, 12:48 PM   Clinical Narrative:    Pt stable for transition home today, daughter to transport home per pt/wife.   CM spoke with pt and wife at bedside. Discussed HH and DME needs.  [facility] office has made referral for [facility] : [facility] needs to [facility]- discussed HH choice - pt/wife agreeable to [facility] for [facility] needs- orders placed for PT/OT.   Pt also needs DME- RW, per wife pt was to get RW at [facility] about a week ago but did not receive the RW prior to leaving.  No preference for DME agency- would like to have RW prior to leaving today.   Address, phone #s, PCP all confirmed in epic.   Notified [facility] liaison pt agreeable to referral and referral accepted   Referral sent to [facility] liaison for DME- RW need- RW to be delivered to pt prior to discharge.   No further TOC needs noted.    Final next level of care: Home w Home Health Services Barriers to Discharge: Barriers Resolved   Patient Goals and CMS Choice Patient states their goals for this hospitalization and ongoing recovery are:: return home CMS Medicare.gov Compare Post Acute Care list provided to:: Patient Choice offered to / list presented to : Patient, Spouse      Discharge Placement               Home w/ [facility]        Discharge Plan and Services Additional resources added to the After Visit Summary for     Discharge Planning Services: CM Consult Post Acute Care Choice: Home Health, Durable Medical Equipment          DME Arranged: Walker rolling DME Agency: [facility] Date DME Agency Contacted: 02/10/24 Time DME Agency Contacted: 1247 Representative spoke with at DME Agency: Ian Malkin HH Arranged:  PT, OT HH Agency: [facility] ([facility] Date HH Agency Contacted: 02/10/24 Time HH Agency Contacted: 1248 Representative spoke with at [facility] Agency: Aggie Cosier  Social Drivers of Health (SDOH) Interventions SDOH Screenings   Food Insecurity: No Food Insecurity (02/08/2024)  Housing: Low Risk  (02/08/2024)  Transportation Needs: No Transportation Needs (02/08/2024)  Utilities: Not At Risk (02/08/2024)  Social Connections: Socially Integrated (02/08/2024)  Tobacco Use: Medium Risk (02/09/2024)     Readmission Risk Interventions    02/10/2024   12:48 PM  Readmission Risk Prevention Plan  Transportation Screening Complete  Home Care Screening Complete  Medication Review (RN CM) Complete

## 2024-02-10 NOTE — Progress Notes (Signed)
 Discharge instructions reviewed with pt and his wife.  Copy of instructions given to pt. [facility] TOC Pharmacy has filled his scripts and will be picked up on the way out for discharge.  Pt d/c'd via wheelchair with belongings, with wife and daughter.            Escorted by staff.   Annice Needy, RN SWOT

## 2024-02-10 NOTE — Discharge Summary (Signed)
 Discharge Summary     Jacob Avery 1946-06-14 78 y.o. male  [id number]  Admission Date: 02/07/2024  Discharge Date: 02/10/2024  Physician: Juventino Slovak*  Admission Diagnosis: Stenosis of right carotid artery [I65.21] Carotid stenosis [I65.29] Carotid stenosis, asymptomatic [I65.29]   HPI:   This is a 78 y.o. male with history of right ICA stenosis with recent history of right MCA distribution stroke and subsequent 2 episodes of TIA. He is now indicated for right carotid endarterectomy.   Hospital Course:  On 02/07/2024, patient presented for right carotid endarterectomy with recent history of stroke and again with at least 1 episode of TIA possibly 2 additional TIAs. Initially we are planning for carotid endarterectomy today but unfortunately Dr. Randie Heinz was preoccupied by an emergency in the operating room until about 5 PM. The patient was offered procedure with my partner earlier in the day but declined. We have admitted the patient, placed on heparin drip and he is scheduled for right carotid endarterectomy with me on Thursday, 02-09-2024.   On 02/08/2024, He is on for right carotid endarterectomy first case tomorrow morning and we again discussed the perioperative plans as well as briefly again discussed the risk and benefits of the surgery. He will be on heparin drip to reduce his risk of stroke overnight and this can be held on-call to the OR tomorrow.   The patient was admitted to the hospital and taken to the operating room on 02/09/2024 and underwent  Right carotid endarterectomy with 12 French shunt neuroprotection and bovine pericardial patch angioplasty     Findings: There was a thickened common carotid artery identified preoperatively with ultrasound and a very friable ruptured plaque lesion at the common carotid artery bifurcation.  There was a second area of calcified stenosis higher on the ICA and we were able to establish control of the artery distally to this.   After extensive endarterectomy we elected to tack the distal lesion as there was a large shelf but this did tack nicely.  We then performed bovine pericardial patch angioplasty.  During endarterectomy and patch angioplasty we shunted with a 12 French shunt and flow was confirmed.  Upon awakening from anesthesia the patient was initially not responding to commands with his left upper and lower extremity and the carotid artery was evaluated the ultrasound at bedside and noted to be patent and he was transferred to the recovery area and after awakening from anesthesia fully he was answering questions appropriately and deliberately using his left upper and lower extremities.   The pt tolerated the procedure well and was transported to the PACU in good condition.   By POD 1, the pt neuro status was at baseline with left sided weakness and pt was doing well.  He did have a sore throat but no difficulty swallowing.  He has worked with OT/PT who recommend HHPT and OT as well as RW.  He was able to void.     He will be continued on Plavix and not Brilinta per Dr. Randie Heinz.  His Prilosec was changed to Protonix due to this.     Recent Labs    02/10/24 0500  NA 136  K 3.9  CL 103  CO2 23  GLUCOSE 206*  BUN 18  CALCIUM 8.6*   Recent Labs    02/09/24 0334 02/10/24 0500  WBC 13.2* 11.9*  HGB 12.6* 10.9*  HCT 40.6 34.8*  PLT 377 336   No results for input(s): "INR" in the last 72 hours.  Discharge Instructions     Discharge patient   Complete by: As directed    Discharge home once pt has been seen by Dr. Randie Heinz, [facility] needs arranged and he has his medications from [facility].  Thanks   Discharge disposition: 01-Home or Self Care   Discharge patient date: 02/10/2024       Discharge Diagnosis:  Stenosis of right carotid artery [I65.21] Carotid stenosis [I65.29] Carotid stenosis, asymptomatic [I65.29]  Secondary Diagnosis: Patient Active Problem List   Diagnosis Date Noted   Carotid stenosis,  asymptomatic 02/07/2024   Class 2 obesity 01/30/2024   Acute CVA (cerebrovascular accident) (HCC) 01/29/2024   Dyslipidemia 01/29/2024   Type 2 diabetes mellitus without complications (HCC) 01/29/2024   BPH (benign prostatic hyperplasia) 01/29/2024   GERD without esophagitis 01/29/2024   Annual physical exam 01/13/2021   Benign prostatic hyperplasia with lower urinary tract symptoms 12/19/2018   Hypertensive heart and renal disease 12/19/2018   PAD (peripheral artery disease) (HCC) 05/30/2018   Critical lower limb ischemia (HCC) 04/10/2018   Chronic kidney disease, stage 2 (mild) 12/14/2017   Hx of adenomatous polyp of rectum 06/10/2017   Long term (current) use of insulin (HCC) 08/19/2015   Sequelae of cerebrovascular disease 08/19/2015   HLD (hyperlipidemia) 01/07/2015   Obstructive sleep apnea 01/07/2015   Sleep apnea 12/11/2014   Snoring 12/11/2014   Severe obesity (BMI >= 40) (HCC) 12/11/2014   Embolic stroke involving right carotid artery (HCC) 12/11/2014   Palpitations 10/29/2014   Carotid stenosis    Essential hypertension 10/28/2014   Hyperlipidemia LDL goal <70 10/28/2014   Morbid obesity (HCC) 10/28/2014   Diabetes (HCC) 10/28/2014   likely OSA (obstructive sleep apnea) 10/28/2014   Cerebral infarction due to occlusion or stenosis of precerebral artery (HCC) 10/25/2014   Proteinuria 04/03/2012   Gout 06/14/2011   Hyperglycemia due to type 2 diabetes mellitus (HCC) 06/03/2010   OTHER SPECIFIED INTESTINAL OBSTRUCTION 12/22/2009   DIVERTICULOSIS-COLON 12/22/2009   Diabetic renal disease (HCC) 05/25/2009   Male erectile disorder 05/25/2009   Pure hypercholesterolemia 05/25/2009   Past Medical History:  Diagnosis Date   Arthritis    Chronic kidney disease    Stage 2   CVA (cerebral vascular accident) (HCC) 10/2014    Late effect of cerebrovascular disease.  Mostly resolved, but can see difference in facial expressions.  tp-A, Plavix afterward   Diabetes mellitus  without complication (HCC)    type II, controlled, with renal comps   Diabetic foot (HCC) 04/05/2018   Normal sensation, skin intact   Diverticulosis    ED (erectile dysfunction)    GI bleed 04/2017   Gout    History of claustrophobia    with MRI   Hx of adenomatous polyp of rectum 06/10/2017   Hypercholesterolemia    Hypertension    Long term (current) use of insulin (HCC)    Remains on basal/bolus therapy without hypoglycemia.   Microalbuminuria 2013   Morbid obesity (HCC)    Obesity    OSA (obstructive sleep apnea)    Intolerant to CPAP   Peripheral vascular disease (HCC)    Right calf pain 04/05/2018   Vertigo     Allergies as of 02/10/2024   No Known Allergies      Medication List     STOP taking these medications    omeprazole 20 MG capsule Commonly known as: PRILOSEC   ticagrelor 90 MG Tabs tablet Commonly known as: BRILINTA       TAKE these medications  Accu-Chek FastClix Lancets Misc   Accu-Chek Guide test strip Generic drug: glucose blood USE STRIP TO CHECK GLUCOSE THREE TIMES DAILY   acetaminophen 650 MG CR tablet Commonly known as: TYLENOL Take 1,300 mg by mouth in the morning and at bedtime.   amLODipine 10 MG tablet Commonly known as: NORVASC Take 10 mg by mouth in the morning.   aspirin EC 81 MG tablet Take 81 mg by mouth in the morning.   atorvastatin 80 MG tablet Commonly known as: LIPITOR Take 80 mg by mouth in the morning.   B-D ULTRAFINE III SHORT PEN 31G X 8 MM Misc Generic drug: Insulin Pen Needle use pen needs 4 times a day with insulin  (DX: ICD10: E11.51)   clopidogrel 75 MG tablet Commonly known as: PLAVIX Take 75 mg by mouth in the morning.   colchicine 0.6 MG tablet Take 0.6 mg by mouth daily as needed (gout).   Emergen-C Immune Plus Pack Take 1 packet by mouth daily as needed (immune support/fatigue).   ezetimibe 10 MG tablet Commonly known as: ZETIA Take 10 mg by mouth in the morning.   ferrous sulfate  325 (65 FE) MG EC tablet Take 325 mg by mouth in the morning.   HumaLOG KwikPen 100 UNIT/ML KwikPen Generic drug: insulin lispro Inject 0-8 Units into the skin 2 (two) times daily as needed. Sliding scale Take 2 units if greater then 150   hydrALAZINE 25 MG tablet Commonly known as: APRESOLINE Take 1 tablet (25 mg total) by mouth 3 (three) times daily.   Insulin Syringe-Needle U-100 31G X 5/16" 1 ML Misc use one needle once daily   Lantus SoloStar 100 UNIT/ML Solostar Pen Generic drug: insulin glargine Inject 14 Units into the skin 2 (two) times daily.   loratadine 10 MG tablet Commonly known as: CLARITIN Take 1 tablet (10 mg total) by mouth daily.   losartan-hydrochlorothiazide 100-25 MG tablet Commonly known as: HYZAAR Take 1 tablet by mouth in the morning.   metoprolol tartrate 25 MG tablet Commonly known as: LOPRESSOR Take 25 mg by mouth 2 (two) times daily.   nitroGLYCERIN 0.4 MG SL tablet Commonly known as: NITROSTAT Place 1 tablet (0.4 mg total) under the tongue every 5 (five) minutes as needed for chest pain.   oxyCODONE-acetaminophen 5-325 MG tablet Commonly known as: Percocet Take 1 tablet by mouth every 6 (six) hours as needed for severe pain (pain score 7-10).   pantoprazole 40 MG tablet Commonly known as: PROTONIX Take 1 tablet (40 mg total) by mouth daily. Start taking on: February 11, 2024   tamsulosin 0.4 MG Caps capsule Commonly known as: FLOMAX Take 0.4 mg by mouth at bedtime.               Durable Medical Equipment  (From admission, onward)           Start     Ordered   02/10/24 1227  For home use only DME Walker rolling  Once       Comments: Left sided weakness  Question Answer Comment  Walker: With 5 Inch Wheels   Patient needs a walker to treat with the following condition S/P carotid endarterectomy      02/10/24 1227             [facility] Discharge Instructions Carotid Endarterectomy  (CEA)  Please refer to the following instructions for your post-procedure care. Your surgeon or physician assistant will discuss any changes with you.  Activity  You are encouraged to walk as much as you can. You can slowly return to normal activities but must avoid strenuous activity and heavy lifting until your doctor tell you it's OK. Avoid activities such as vacuuming or swinging a golf club. You can drive after one week if you are comfortable and you are no longer taking prescription pain medications. It is normal to feel tired for serval weeks after your surgery. It is also normal to have difficulty with sleep habits, eating, and bowel movements after surgery. These will go away with time.  Bathing/Showering  You may shower after you come home. Do not soak in a bathtub, hot tub, or swim until the incision heals completely.  Incision Care  Shower every day. Clean your incision with mild soap and water. Pat the area dry with a clean towel. You do not need a bandage unless otherwise instructed. Do not apply any ointments or creams to your incision. You may have skin glue on your incision. Do not peel it off. It will come off on its own in about one week. Your incision may feel thickened and raised for several weeks after your surgery. This is normal and the skin will soften over time. For Men Only: It's OK to shave around the incision but do not shave the incision itself for 2 weeks. It is common to have numbness under your chin that could last for several months.  Diet  Resume your normal diet. There are no special food restrictions following this procedure. A low fat/low cholesterol diet is recommended for all patients with vascular disease. In order to heal from your surgery, it is CRITICAL to get adequate nutrition. Your body requires vitamins, minerals, and protein. Vegetables are the best source of vitamins and minerals. Vegetables also provide the perfect balance of protein. Processed  food has little nutritional value, so try to avoid this.  Medications  Resume taking all of your medications unless your doctor or physician assistant tells you not to.  If your incision is causing pain, you may take over-the- counter pain relievers such as acetaminophen (Tylenol). If you were prescribed a stronger pain medication, please be aware these medications can cause nausea and constipation.  Prevent nausea by taking the medication with a snack or meal. Avoid constipation by drinking plenty of fluids and eating foods with a high amount of fiber, such as fruits, vegetables, and grains.  Do not take Tylenol if you are taking prescription pain medications.  Follow Up  Our office will schedule a follow up appointment 2-3 weeks following discharge.  Please call us immediately for any of the following conditions  Increased pain, redness, drainage (pus) from your incision site. Fever of 101 degrees or higher. If you should develop stroke (slurred speech, difficulty swallowing, weakness on one side of your body, loss of vision) you should call 911 and go to the nearest emergency room.  Reduce your risk of vascular disease:  Stop smoking. If you would like help call [facility] at 1-800-QUIT-NOW ((772) 583-1791) or  at (940)810-5616. Manage your cholesterol Maintain a desired weight Control your diabetes Keep your blood pressure down  If you have any questions, please call the office at [phone].  Prescriptions given: 1.   Roxicet #8 No Refill 2.  Protonix 40mg  #30 two refills (changed from Prilosec due to Plavix) refills per PCP  Disposition: home with HHPT/OT  Patient's condition: is Good  Follow up: 1. [facility] in 2-3 weeks on Dr. Randie Heinz clinic day.  Doreatha Massed, PA-C [facility] [phone]   --- For [facility] use ---   Modified Rankin score at D/C (0-6): 1  IV medication needed for:  1. Hypertension: No 2. Hypotension:  No  Post-op Complications: No  1. Post-op CVA or TIA: No  If yes: Event classification (right eye, left eye, right cortical, left cortical, verterobasilar, other): n/a  If yes: Timing of event (intra-op, <6 hrs post-op, >=6 hrs post-op, unknown): n/a  2. CN injury: No  If yes: CN n/a injuried   3. Myocardial infarction: No  If yes: Dx by (EKG or clinical, Troponin): n/a  4.  CHF: No  5.  Dysrhythmia (new): No  6. Wound infection: No  7. Reperfusion symptoms: No  8. Return to OR: No  If yes: return to OR for (bleeding, neurologic, other CEA incision, other): n/a  Discharge medications: Statin use:  Yes ASA use:  Yes   Beta blocker use:  Yes ACE-Inhibitor use:  No  ARB use:  Yes CCB use: Yes P2Y12 Antagonist use: Yes, [ x] Plavix, [ ]  Plasugrel, [ ]  Ticlopinine, [ ]  Ticagrelor, [ ]  Other, [ ]  No for medical reason, [ ]  Non-compliant, [ ]  Not-indicated Anti-coagulant use:  No, [ ]  Warfarin, [ ]  Rivaroxaban, [ ]  Dabigatran,

## 2024-02-10 NOTE — Discharge Instructions (Signed)

## 2024-02-10 NOTE — Care Management Important Message (Signed)
 Important Message  Patient Details  Name: Jacob Avery MRN: [id number] Date of Birth: [birth year]   Important Message Given:  Yes - Medicare IM     Dorena Bodo 02/10/2024, 4:08 PM

## 2024-02-10 NOTE — Anesthesia Postprocedure Evaluation (Signed)
 Anesthesia Post Note  Patient: Jacob Avery  Procedure(s) Performed: RIGHT CAROTID ENDARTERECTOMY WITH PATCH ANGIOPLASTY USING XENOSURE BIOLOGIC PATCH 1 CM x 14 CM (Right: Neck) ANGIOPLASTY, USING PATCH GRAFT (Right: Neck)     Patient location during evaluation: PACU Anesthesia Type: General Level of consciousness: awake and alert Pain management: pain level controlled Vital Signs Assessment: post-procedure vital signs reviewed and stable Respiratory status: spontaneous breathing, nonlabored ventilation, respiratory function stable and patient connected to nasal cannula oxygen Cardiovascular status: blood pressure returned to baseline and stable Postop Assessment: no apparent nausea or vomiting Anesthetic complications: no   No notable events documented.  Last Vitals:  Vitals:   02/10/24 1018 02/10/24 1217  BP: (!) 148/57 (!) 121/49  Pulse:  70  Resp:  19  Temp:  37.2 C  SpO2:  91%    Last Pain:  Vitals:   02/10/24 1217  TempSrc: Oral  PainSc:                  Elia Nunley S

## 2024-02-10 NOTE — Progress Notes (Signed)
 Wife wanting to speak with Dr. Randie Heinz. She is off floor at gift shop. Dr. Randie Heinz aware and will come by later after procedure to see her. Pt resting with call bell within reach.  Will continue to monitor.

## 2024-02-10 NOTE — Progress Notes (Signed)
 Mobility Specialist Progress Note:    02/10/24 0915  Mobility  Activity Ambulated with assistance in room;Ambulated with assistance in hallway;Ambulated with assistance to bathroom  Level of Assistance Contact guard assist, steadying assist  Assistive Device Front wheel walker  Distance Ambulated (ft) 200 ft  Activity Response Tolerated well  Mobility Referral Yes  Mobility visit 1 Mobility  Mobility Specialist Start Time (ACUTE ONLY) 0915  Mobility Specialist Stop Time (ACUTE ONLY) [id number]  Mobility Specialist Time Calculation (min) (ACUTE ONLY) 8 min   Pt received in bed, agreeable to mobility session. Family member in room. Ambulated in room and hallway with CGA and RW for safety. Tolerated well, took 1 standing rest break d/t fatigue. Pt reported feeling "like his legs were  going to give out." Returned pt to room, requesting assistance to bathroom. Left pt in BR with all needs met, encouraged pulling bathroom light when finished.   Feliciana Rossetti Mobility Specialist Please contact via Special educational needs teacher or  Rehab office at [phone]

## 2024-02-15 LAB — POCT ACTIVATED CLOTTING TIME
Activated Clotting Time: 268 s
Activated Clotting Time: 233 s

## 2024-02-23 ENCOUNTER — Encounter (HOSPITAL_COMMUNITY): Payer: Self-pay | Admitting: Vascular Surgery

## 2024-02-24 ENCOUNTER — Ambulatory Visit: Attending: Cardiovascular Disease

## 2024-02-24 DIAGNOSIS — I639 Cerebral infarction, unspecified: Secondary | ICD-10-CM

## 2024-03-07 ENCOUNTER — Encounter: Payer: Self-pay | Admitting: Physician Assistant

## 2024-03-07 ENCOUNTER — Ambulatory Visit: Attending: Surgery | Admitting: Physician Assistant

## 2024-03-07 VITALS — BP 132/73 | HR 40 | Temp 98.2°F | Ht 71.0 in | Wt 245.6 lb

## 2024-03-07 DIAGNOSIS — I6521 Occlusion and stenosis of right carotid artery: Secondary | ICD-10-CM

## 2024-03-07 NOTE — Progress Notes (Unsigned)
 POST OPERATIVE OFFICE NOTE    CC:  F/u for surgery  HPI:  This is a 78 y.o. male who is s/p right CEA on 02/09/2024 by Dr. Vikki Graves for symptomatic right carotid artery stenosis.    Pt also has hx of left axillobifemoral bypass graft and left femoral endarterectomy on 05/30/2018 by Dr. Farrel Hones. This was done for critical limb ischemia with rest pain bilaterally and a known abdominal aortic dissection. His axillobifemoral bypass graft has been known to be occluded.  His ABI's were stable at his last visit and was scheduled for f/u in 6 months with repeat ABI, which is scheduled for Monday.   Pt returns today for follow up and here with his wife and daughter.  Pt states he is doing well.  He states that his left hand weakness is getting better.  He denies any temporary blindness, speech difficulties or any new unilateral weakness, numbness or paralysis.   He is compliant with his asa/statin/plavix .    He is scheduled for ABI on Monday.  He denies any claudication, rest pain or non healing wounds.  He states that his legs just get heavy when he walks.     No Known Allergies  Current Outpatient Medications  Medication Sig Dispense Refill   Accu-Chek FastClix Lancets MISC      ACCU-CHEK GUIDE test strip USE STRIP TO CHECK GLUCOSE THREE TIMES DAILY     acetaminophen  (TYLENOL ) 650 MG CR tablet Take 1,300 mg by mouth in the morning and at bedtime.     amLODipine  (NORVASC ) 10 MG tablet Take 10 mg by mouth in the morning.     aspirin  EC 81 MG tablet Take 81 mg by mouth in the morning.     atorvastatin  (LIPITOR ) 80 MG tablet Take 80 mg by mouth in the morning.     clopidogrel  (PLAVIX ) 75 MG tablet Take 75 mg by mouth in the morning.     colchicine  0.6 MG tablet Take 0.6 mg by mouth daily as needed (gout).     ezetimibe  (ZETIA ) 10 MG tablet Take 10 mg by mouth in the morning.     ferrous sulfate  325 (65 FE) MG EC tablet Take 325 mg by mouth in the morning.     HUMALOG KWIKPEN 100 UNIT/ML KwikPen Inject  0-8 Units into the skin 2 (two) times daily as needed. Sliding scale Take 2 units if greater then 150     hydrALAZINE  (APRESOLINE ) 25 MG tablet Take 1 tablet (25 mg total) by mouth 3 (three) times daily. 270 tablet 3   Insulin  Pen Needle (B-D ULTRAFINE III SHORT PEN) 31G X 8 MM MISC use pen needs 4 times a day with insulin   (DX: ICD10: E11.51)     Insulin  Syringe-Needle U-100 31G X 5/16" 1 ML MISC use one needle once daily     LANTUS  SOLOSTAR 100 UNIT/ML Solostar Pen Inject 14 Units into the skin 2 (two) times daily.     loratadine  (CLARITIN ) 10 MG tablet Take 1 tablet (10 mg total) by mouth daily. 30 tablet 1   losartan -hydrochlorothiazide  (HYZAAR) 100-25 MG per tablet Take 1 tablet by mouth in the morning.     metoprolol  tartrate (LOPRESSOR ) 25 MG tablet Take 25 mg by mouth 2 (two) times daily.     Multiple Vitamins-Minerals (EMERGEN-C IMMUNE PLUS) PACK Take 1 packet by mouth daily as needed (immune support/fatigue).     nitroGLYCERIN  (NITROSTAT ) 0.4 MG SL tablet Place 1 tablet (0.4 mg total) under the tongue every 5 (  five) minutes as needed for chest pain. 25 tablet 3   oxyCODONE -acetaminophen  (PERCOCET) 5-325 MG tablet Take 1 tablet by mouth every 6 (six) hours as needed for severe pain (pain score 7-10). 8 tablet 0   pantoprazole  (PROTONIX ) 40 MG tablet Take 1 tablet (40 mg total) by mouth daily. 30 tablet 2   tamsulosin  (FLOMAX ) 0.4 MG CAPS capsule Take 0.4 mg by mouth at bedtime.     No current facility-administered medications for this visit.     ROS:  See HPI  Physical Exam:  Today's Vitals   03/07/24 1438  BP: 132/73  Pulse: (!) 40  Temp: 98.2 F (36.8 C)  TempSrc: Temporal  SpO2: 93%  Weight: 245 lb 9.6 oz (111.4 kg)  Height: 5\' 11"  (1.803 m)   Body mass index is 34.25 kg/m.   Incision:  well healed without hematoma Neuro: left hand improving otherwise in tact    Assessment/Plan:  This is a 78 y.o. male who is s/p: right CEA on 02/09/2024 by Dr. Vikki Graves for  symptomatic right carotid artery stenosis.    Carotid stenosis -pt doing well from carotid surgery and left hand weakness continues to improve -continue asa/statin/plavix  -discussed there is small percentage chance of restenosis in the first year.   PAD -pt had ABI's in November that were moderately decreased.  He is scheduled for ABI on Monday.  Discussed that if he had ABI test and they decreased,  if he is not having any issues, we would not do anything different.  -he is working with PT and I encouraged him to continue his exercises as instructed to help him improve his mobility and continue graduated walking program.   -Dr. Vikki Graves discussed this with them and we will cancel the appt for Monday and reschedule for when he returns for carotid duplex.   -f/u in 9 months with carotid duplex and ABI.  They will call sooner if any issues before then.  He knows to go to the ER if he develops any stroke like sx.  Ok to [place] the yard.  Ok to drive.     Maryanna Smart, [facility] [phone]   Clinic MD:  Vikki Graves

## 2024-03-10 ENCOUNTER — Other Ambulatory Visit (HOSPITAL_COMMUNITY): Payer: Self-pay

## 2024-03-12 ENCOUNTER — Ambulatory Visit (HOSPITAL_COMMUNITY): Payer: Medicare Other

## 2024-03-12 ENCOUNTER — Ambulatory Visit: Payer: Medicare Other

## 2024-03-20 ENCOUNTER — Ambulatory Visit: Payer: Self-pay | Admitting: Cardiovascular Disease

## 2024-03-20 DIAGNOSIS — I639 Cerebral infarction, unspecified: Secondary | ICD-10-CM | POA: Diagnosis not present

## 2024-04-05 ENCOUNTER — Telehealth: Payer: Self-pay

## 2024-04-05 NOTE — Telephone Encounter (Signed)
 Jacob Avery, PT with [facility] called asking about pt's low heart rate.  Dr. Vikki Graves ordered PT after his right CEA.  Jacob Avery was advised to call pt's cardiologist, Dr. Stann Earnest, to clarify vital sign parameters for the pt.

## 2024-04-19 NOTE — Progress Notes (Signed)
 Cardiology Office Note    Date:  04/20/2024  ID:  Jacob Avery, DOB Sep 25, 1946, MRN [id number] Cardiologist: Maude Emmer, MD    History of Present Illness:    Jacob Avery is a 78 y.o. male with past medical history of presumed CAD (prior NST in 2019 showing evidence of prior infarct with mild peri-infarct ischemia and medical management recommended), HTN, HLD and PVD (prior femoral endarterectomy and left axillary bifemoral bypass in 2019) who presents to the office today for hospital follow-up.  He was last examined by Dr. Emmer in 04/2023 and reported having intermittent claudication but denied any specific anginal symptoms. He had previously been intolerant to Lopressor  given bradycardia and no changes were made to his medications at that time. He was continued on Amlodipine  10 mg daily, ASA 81 mg daily, Atorvastatin  80 mg daily, Plavix  75 mg daily, Zetia  10 mg daily, Losartan -HCTZ 100-25 mg daily and Hydralazine  25 mg 3 times daily.  In the interim, he was hospitalized at [facility] in 12/2023 for an acute CVA and he was out of the therapeutic window for tPA. Imaging showed multiple acute infarcts which was suspicious for embolic etiology. CTA Neck did show 80% stenosis of the RICA. Echocardiogram at that time showed a hyperdynamic LVEF of 70 to 75% with grade 1 diastolic dysfunction and normal RV function. Bubble study was negative. 30-day monitor was recommended at discharge as well. This showed predominantly normal sinus rhythm and PVC's with a less than 1% burden and frequent PAC's with a 14% burden. Average heart rate was 43 bpm but there was no evidence of high-grade AV block.  He did meet with Vascular Surgery as an outpatient and ultimately underwent right carotid endarterectomy on 02/09/2024. No immediate complications were noted. He was switched from Brilinta  to Plavix  and PPI therapy was also transitioned to Pantoprazole .  In talking with patient, his wife and his grandson today he  reports overall doing well given his multiple medical issues over the past few months. He does have residual left-sided weakness but is working with Home Health PT and OT. Says that they have noted that his heart rate has been low at times. He has been taking Lopressor  25 mg twice daily as this was restarted during his admission for unclear reasons.  He denies any chest pain or palpitations. Does have dyspnea on exertion which has been occurring for several months. No specific orthopnea, PND or pitting edema.  Studies Reviewed:   EKG: EKG is ordered today and demonstrates:   EKG Interpretation Date/Time:  Friday April 20 2024 13:29:29 EDT Ventricular Rate:  36 PR Interval:  168 QRS Duration:  90 QT Interval:  520 QTC Calculation: 402 R Axis:   28  Text Interpretation: Marked sinus bradycardia Confirmed by Johnson Grate ([id number]) on 04/20/2024 2:44:28 PM       Echocardiogram: 12/2023 IMPRESSIONS     1. Left ventricular ejection fraction, by estimation, is 70 to 75%. The  left ventricle has hyperdynamic function. The left ventricle has no  regional wall motion abnormalities. Left ventricular diastolic parameters  are consistent with Grade I diastolic  dysfunction (impaired relaxation).   2. Right ventricular systolic function is normal. The right ventricular  size is moderately enlarged. There is normal pulmonary artery systolic  pressure.   3. Left atrial size was mildly dilated.   4. The mitral valve is normal in structure. No evidence of mitral valve  regurgitation. No evidence of mitral stenosis.   5. The aortic valve is  tricuspid. There is mild calcification of the  aortic valve. Aortic valve regurgitation is not visualized. No aortic  stenosis is present.   6. The inferior vena cava is normal in size with greater than 50%  respiratory variability, suggesting right atrial pressure of 3 mmHg.   7. Agitated saline contrast bubble study was negative, with no evidence  of any  interatrial shunt at rest and inconclusive with Valsalva due to  suboptimal; image.   8. Increased flow velocities may be secondary to anemia, thyrotoxicosis,  hyperdynamic or high flow state.   Carotid Dopplers: 01/2024 Summary:  Right Carotid: Velocities in the right ICA are consistent with a 1-39%  stenosis.                Non-hemodynamically significant plaque <50% noted in the  CCA. The                 ECA appears occluded.   Left Carotid: Velocities in the left ICA (mid) are consistent with a  40-59%               stenosis. Non-hemodynamically significant plaque <50% noted  in the                CCA. The ECA appears <50% stenosed.   Vertebrals:  Bilateral vertebral arteries demonstrate antegrade flow.  Subclavians: Normal flow hemodynamics were seen in bilateral subclavian               arteries.   Event Monitor: 03/2024 NSR  Average HR 43 bpm with range 31-116 bpm PVC < 1% total beats PAC 14% total beats  No high grade AV block  Physical Exam:   VS:  BP 124/80   Pulse (!) 37   Ht 5' 9.5 (1.765 m)   Wt 245 lb 3.2 oz (111.2 kg)   SpO2 97%   BMI 35.69 kg/m    Wt Readings from Last 3 Encounters:  04/20/24 245 lb 3.2 oz (111.2 kg)  03/07/24 245 lb 9.6 oz (111.4 kg)  02/09/24 260 lb (117.9 kg)     GEN: Well nourished, well developed male appearing in no acute distress NECK: No JVD; No carotid bruits CARDIAC: Regular rhythm, bradycardiac rate. no murmurs, rubs, gallops RESPIRATORY:  Clear to auscultation without rales, wheezing or rhonchi  ABDOMEN: Appears non-distended. No obvious abdominal masses. EXTREMITIES: No clubbing or cyanosis. No pitting edema.  Distal pedal pulses are 2+ bilaterally.   Assessment and Plan:   1. Bradycardia - He has a history of baseline bradycardia and for unclear reasons was restarted on Lopressor  during his admission despite this being stopped at prior office visits. EKG today shows significant sinus bradycardia with heart rate in  the 30's. Recent monitor showed his average heart rate was in the 40's but thankfully no evidence of high-grade AV block. Suspect that his dyspnea on exertion might be due to this. Will stop Lopressor  25 mg twice daily and have him report back with BP and heart rate readings next week. Offered repeat visit next week for an EKG but they prefer to call with readings.   2. Coronary artery disease involving native coronary artery of native heart without angina pectoris - He has a history of presumed CAD based off prior NST in 04/2018 showing evidence of prior inferior septal infarct with mild peri-infarct ischemia but was overall a low-risk study. Did not undergo cardiac catheterization given no recent symptoms and medical management pursued.  - Recent echocardiogram in 12/2023 showed a  hyperdynamic LVEF and no regional wall motion abnormalities. He does report dyspnea as described above and suspect this is likely multifactorial in the setting of deconditioning and possibly due to his bradycardia. Will stop Lopressor  as discussed above. If still having dyspnea at the time of his next office visit, would arrange for a repeat NST or Coronary CTA for further ischemic evaluation. - Continue ASA 81 mg daily, Atorvastatin  80 mg daily and Zetia  10 mg daily. He is also on Plavix  per Vascular Surgery.  3. Essential hypertension - BP was initially elevated at 150/86, rechecked and improved to 124/80. Continue current medical therapy with Amlodipine  10 mg daily, Hydralazine  25 mg 3 times daily and Losartan -HCTZ 100-25 mg daily. Will stop Lopressor  25 mg twice daily. Will have them report back with BP readings next week and if BP becomes elevated, would further titrate Hydralazine .  4. Hyperlipidemia LDL goal <70 - FLP in 01/2024 showed total cholesterol 91, triglycerides 81, HDL 27 and LDL 48. Continue current medical therapy with Atorvastatin  80 mg daily and Zetia  10 mg daily.  5. Stenosis of carotid artery,  unspecified laterality - Followed by Vascular Surgery. He recently underwent right carotid endarterectomy on 02/09/2024 after having a CVA. Remains on ASA 81 mg daily, Plavix  75 mg daily and Atorvastatin  80 mg daily.   Signed, Laymon CHRISTELLA Qua, PA-C

## 2024-04-20 ENCOUNTER — Ambulatory Visit: Attending: Student | Admitting: Student

## 2024-04-20 ENCOUNTER — Encounter: Payer: Self-pay | Admitting: Student

## 2024-04-20 VITALS — BP 124/80 | HR 37 | Ht 69.5 in | Wt 245.2 lb

## 2024-04-20 DIAGNOSIS — E785 Hyperlipidemia, unspecified: Secondary | ICD-10-CM

## 2024-04-20 DIAGNOSIS — I2510 Atherosclerotic heart disease of native coronary artery without angina pectoris: Secondary | ICD-10-CM

## 2024-04-20 DIAGNOSIS — I6529 Occlusion and stenosis of unspecified carotid artery: Secondary | ICD-10-CM

## 2024-04-20 DIAGNOSIS — I10 Essential (primary) hypertension: Secondary | ICD-10-CM

## 2024-04-20 DIAGNOSIS — R001 Bradycardia, unspecified: Secondary | ICD-10-CM

## 2024-04-20 NOTE — Patient Instructions (Signed)
 Medication Instructions:  Your physician has recommended you make the following change in your medication:   -Stop Metoprolol  (Lopressor ) TODAY  *If you need a refill on your cardiac medications before your next appointment, please call your pharmacy*  Lab Work: None If you have labs (blood work) drawn today and your tests are completely normal, you will receive your results only by: MyChart Message (if you have MyChart) OR A paper copy in the mail If you have any lab test that is abnormal or we need to change your treatment, we will call you to review the results.  Testing/Procedures: None  Follow-Up: At [facility], you and your health needs are our priority.  As part of our continuing mission to provide you with exceptional heart care, our providers are all part of one team.  This team includes your primary Cardiologist (physician) and Advanced Practice Providers or APPs (Physician Assistants and Nurse Practitioners) who all work together to provide you with the care you need, when you need it.  Your next appointment:   2-3 month(s)  Provider:   You may see Janelle Mediate, MD or one of the following Advanced Practice Providers on your designated Care Team:   Woodfin Hays, PA-C  [place], [place] Theotis Flake, [place]     We recommend signing up for the patient portal called MyChart.  Sign up information is provided on this After Visit Summary.  MyChart is used to connect with patients for Virtual Visits (Telemedicine).  Patients are able to view lab/test results, encounter notes, upcoming appointments, etc.  Non-urgent messages can be sent to your provider as well.   To learn more about what you can do with MyChart, go to [email or web address].   Other Instructions Call our office with your heart rate and blood pressure readings next week.

## 2024-04-26 ENCOUNTER — Encounter: Payer: Self-pay | Admitting: Podiatry

## 2024-04-26 ENCOUNTER — Ambulatory Visit (INDEPENDENT_AMBULATORY_CARE_PROVIDER_SITE_OTHER): Admitting: Podiatry

## 2024-04-26 DIAGNOSIS — M79675 Pain in left toe(s): Secondary | ICD-10-CM | POA: Diagnosis not present

## 2024-04-26 DIAGNOSIS — B351 Tinea unguium: Secondary | ICD-10-CM | POA: Diagnosis not present

## 2024-04-26 DIAGNOSIS — E1151 Type 2 diabetes mellitus with diabetic peripheral angiopathy without gangrene: Secondary | ICD-10-CM

## 2024-04-26 DIAGNOSIS — M79674 Pain in right toe(s): Secondary | ICD-10-CM

## 2024-04-26 NOTE — Progress Notes (Signed)
This patient returns to my office for at risk foot care.  This patient requires this care by a professional since this patient will be at risk due to having diabetes and CKD.  This patient is unable to cut nails himself since the patient cannot reach his nails.These nails are painful walking and wearing shoes.  This patient presents for at risk foot care today.  General Appearance  Alert, conversant and in no acute stress.  Vascular  Dorsalis pedis and posterior tibial  pulses are  weakly palpable  bilaterally.  Capillary return is within normal limits  bilaterally. Temperature is within normal limits  bilaterally.  Neurologic  Senn-Weinstein monofilament wire test within normal limits  bilaterally. Muscle power within normal limits bilaterally.  Nails Thick disfigured discolored nails with subungual debris  from hallux to fifth toes bilaterally. No evidence of bacterial infection or drainage bilaterally.  Orthopedic  No limitations of motion  feet .  No crepitus or effusions noted.  No bony pathology or digital deformities noted.  Midfoot  DJD.  Skin  normotropic skin with no porokeratosis noted bilaterally.  No signs of infections or ulcers noted.     Onychomycosis  Pain in right toes  Pain in left toes  Consent was obtained for treatment procedures.   Mechanical debridement of nails 1-5  bilaterally performed with a nail nipper.  Filed with dremel without incident.    Return office visit      3 months                Told patient to return for periodic foot care and evaluation due to potential at risk complications.   Helane Gunther DPM

## 2024-04-30 ENCOUNTER — Encounter: Payer: Self-pay | Admitting: Neurology

## 2024-04-30 ENCOUNTER — Ambulatory Visit (INDEPENDENT_AMBULATORY_CARE_PROVIDER_SITE_OTHER): Admitting: Neurology

## 2024-04-30 VITALS — BP 163/74 | HR 81 | Resp 15 | Ht 70.0 in | Wt 245.0 lb

## 2024-04-30 DIAGNOSIS — I639 Cerebral infarction, unspecified: Secondary | ICD-10-CM

## 2024-04-30 DIAGNOSIS — I6521 Occlusion and stenosis of right carotid artery: Secondary | ICD-10-CM | POA: Diagnosis not present

## 2024-04-30 NOTE — Progress Notes (Signed)
 [facility]  PATIENT: Jacob Avery DOB: 06-06-1946  REQUESTING CLINICIAN: Shelton Arlin KIDD, MD HISTORY FROM: Patient/Spouse/Chart review  REASON FOR VISIT: Right hemispheric stroke    HISTORICAL  CHIEF COMPLAINT:  Chief Complaint  Patient presents with   New Patient (Initial Visit)    RM12, wife present,  internal referral for Acute RCJ:hjpu has gotten worse post cva and easily fatigued     HISTORY OF PRESENT ILLNESS:  This is a 78 year old gentleman with past medical history including previous strokes, hypertension, hyperlipidemia, diabetes mellitus, PVD, untreated sleep apnea who is presenting after being admitted to hospital in April for confusion and found to have right hemispheric strokes and right carotid stenosis. Patient was taken to the hospital due to confusion.  Wife tells me that that morning when she was at church, patient was not acting appropriately, was slow and repeating the same statement.  EMS was called and he was taken to the hospital.  In the hospital he was found to have multiple right hemispheric strokes involving the frontal, parietal, temporal, and occipital regions.  His angiogram showed right carotid artery plaque.  Wife mentions he did have deficit including speech, and left hand weakness.  Patient was discharged with outpatient therapy and scheduled for right CEA done on February 09, 2024.  Tells me that he is recuperating very well, he is currently on aspirin  and Plavix  and is doing both speech occupational and physical therapy.  He still have difficulty with fine movement with his left hand.    Brief Hospital admission narrative: As per H&P written by Dr.Mansy Kalep Full is a 78 y.o. African-American male with medical history significant for CVA, type 2 diabetes mellitus, essential hypertension, dyslipidemia, peripheral vascular disease and diverticulosis, who presented to the emergency room with acute onset of altered mental status  with confusion and incoherence that started yesterday.  The patient's daughter stated that his blood glucose was 42 earlier today and after having an orange juice it came up to 64.  The patient was noted to be dropping things from his left hand today and has been off balance.  He felt weak in the left upper extremity.  No significant tingling or numbness.  No dysphagia or dysarthria.  He has been having mild cough and dyspnea with wheezing mainly on exertion however not significantly more than his usual baseline.  No fever or chills.  He denied any urinary or stool incontinence.  No tinnitus or vertigo.  No witnessed seizures.  He has been taking aspirin  daily.  It is unclear if he has been taking Plavix  regularly.   ED Course: When the patient came to the ER, BP was 150/69 with a heart rate of 54 otherwise normal vital signs.  Labs revealed mild hypokalemia 3.4 and a blood glucose of 156 with otherwise normal vital signs.  High-sensitivity troponin I was 11 twice.  CBC was unremarkable.  UA showed 30 protein and was otherwise negative. EKG as reviewed by me : EKG showed mild sinus bradycardia with a rate of 52. Imaging: Portable chest x-ray showed no acute cardiopulmonary disease. Brain MRI without contrast revealed the following: 1. Numerous small acute right cerebral infarcts involving cortex greater than white matter in the right frontal, parietal, occipital, and temporal lobes (MCA and PCA territories/watershed regions). Punctate acute infarcts are also present in the right basal ganglia.   Assessment and Plan: * Acute CVA (cerebrovascular accident) (HCC) - This involves cortex greater than white matter in the right frontal, parietal,  occipital, and temporal lobes (MCA and PCA territories/watershed regions) as well as punctate acute infarcts which are also present in the right basal ganglia.  This is highly suspicious for embolic etiology given multiple cerebral infarcts. - The patient has  subsequent altered mental status with confusion, incoherence, left sided weakness and imbalance. -Patient was out of therapeutic window for tPA -After completing acute workup no thrombi appreciated on his 2D echo; CTA demonstrating positive right-sided carotid plaque.  Patient will need follow-up with vascular surgery. -After discussing with neurology service recommendation given for 30 days of Brilinta  and aspirin  -Continue Refacto modifications regarding blood pressure control, statin and diabetes management. -Stable mentation at time of discharge.    OTHER MEDICAL CONDITIONS: Previous stroke 2016, hypertension, hyperlipidemia, diabetes mellitus, PVD, obstructive sleep apnea not on CPAP.   REVIEW OF SYSTEMS: Full 14 system review of systems performed and negative with exception of: As noted in the HPI   ALLERGIES: Allergies  Allergen Reactions   Beta Adrenergic Blockers     Significant bradycardia with Lopressor     HOME MEDICATIONS: Outpatient Medications Prior to Visit  Medication Sig Dispense Refill   Accu-Chek FastClix Lancets MISC      ACCU-CHEK GUIDE test strip USE STRIP TO CHECK GLUCOSE THREE TIMES DAILY     acetaminophen  (TYLENOL ) 650 MG CR tablet Take 1,300 mg by mouth in the morning and at bedtime.     amLODipine  (NORVASC ) 10 MG tablet Take 10 mg by mouth in the morning.     aspirin  EC 81 MG tablet Take 81 mg by mouth in the morning.     atorvastatin  (LIPITOR ) 80 MG tablet Take 80 mg by mouth in the morning.     clopidogrel  (PLAVIX ) 75 MG tablet Take 75 mg by mouth in the morning.     colchicine  0.6 MG tablet Take 0.6 mg by mouth daily as needed (gout).     ezetimibe  (ZETIA ) 10 MG tablet Take 10 mg by mouth in the morning.     ferrous sulfate  325 (65 FE) MG EC tablet Take 325 mg by mouth in the morning.     HUMALOG KWIKPEN 100 UNIT/ML KwikPen Inject 0-8 Units into the skin 2 (two) times daily as needed. Sliding scale Take 2 units if greater then 150     hydrALAZINE   (APRESOLINE ) 25 MG tablet Take 1 tablet (25 mg total) by mouth 3 (three) times daily. 270 tablet 3   Insulin  Pen Needle (B-D ULTRAFINE III SHORT PEN) 31G X 8 MM MISC use pen needs 4 times a day with insulin   (DX: ICD10: E11.51)     Insulin  Syringe-Needle U-100 31G X 5/16 1 ML MISC use one needle once daily     LANTUS  SOLOSTAR 100 UNIT/ML Solostar Pen Inject 14 Units into the skin 2 (two) times daily.     loratadine  (CLARITIN ) 10 MG tablet Take 1 tablet (10 mg total) by mouth daily. 30 tablet 1   losartan -hydrochlorothiazide  (HYZAAR) 100-25 MG per tablet Take 1 tablet by mouth in the morning.     Multiple Vitamins-Minerals (EMERGEN-C IMMUNE PLUS) PACK Take 1 packet by mouth daily as needed (immune support/fatigue).     nitroGLYCERIN  (NITROSTAT ) 0.4 MG SL tablet Place 1 tablet (0.4 mg total) under the tongue every 5 (five) minutes as needed for chest pain. 25 tablet 3   oxyCODONE -acetaminophen  (PERCOCET) 5-325 MG tablet Take 1 tablet by mouth every 6 (six) hours as needed for severe pain (pain score 7-10). 8 tablet 0   pantoprazole  (PROTONIX )  40 MG tablet Take 1 tablet (40 mg total) by mouth daily. 30 tablet 2   tamsulosin  (FLOMAX ) 0.4 MG CAPS capsule Take 0.4 mg by mouth at bedtime.     No facility-administered medications prior to visit.    PAST MEDICAL HISTORY: Past Medical History:  Diagnosis Date   Arthritis    Chronic kidney disease    Stage 2   CVA (cerebral vascular accident) (HCC) 10/2014    Late effect of cerebrovascular disease.  Mostly resolved, but can see difference in facial expressions.  tp-A, Plavix  afterward   Diabetes mellitus without complication (HCC)    type II, controlled, with renal comps   Diabetic foot (HCC) 04/05/2018   Normal sensation, skin intact   Diverticulosis    ED (erectile dysfunction)    GI bleed 04/2017   Gout    History of claustrophobia    with MRI   Hx of adenomatous polyp of rectum 06/10/2017   Hypercholesterolemia    Hypertension    Long  term (current) use of insulin  (HCC)    Remains on basal/bolus therapy without hypoglycemia.   Microalbuminuria 2013   Morbid obesity (HCC)    Obesity    OSA (obstructive sleep apnea)    Intolerant to CPAP   Peripheral vascular disease (HCC)    Right calf pain 04/05/2018   Vertigo     PAST SURGICAL HISTORY: Past Surgical History:  Procedure Laterality Date   ABDOMINAL AORTOGRAM N/A 04/13/2018   Procedure: ABDOMINAL AORTOGRAM;  Surgeon: Laurence Redell CROME, MD;  Location: [facility] INVASIVE CV LAB;  Service: Cardiovascular;  Laterality: N/A;   APPLICATION OF WOUND VAC Bilateral 05/30/2018   Procedure: APPLICATION OF WOUND VAC;  Surgeon: Laurence Redell CROME, MD;  Location: [facility] OR;  Service: Vascular;  Laterality: Bilateral;   AXILLARY-FEMORAL BYPASS GRAFT Left 05/30/2018   Procedure: BYPASS GRAFT LEFT AXILLA-BIFEMORAL WITH HEMO SHIELD GOLD VASCULAR GRAFTS;  Surgeon: Laurence Redell CROME, MD;  Location: [facility] OR;  Service: Vascular;  Laterality: Left;   CATARACT EXTRACTION W/PHACO Right 07/03/2021   Procedure: CATARACT EXTRACTION PHACO AND INTRAOCULAR LENS PLACEMENT (IOC);  Surgeon: Harrie Agent, MD;  Location: [facility];  Service: Ophthalmology;  Laterality: Right;  CDE 6.99   CATARACT EXTRACTION W/PHACO Left 07/17/2021   Procedure: CATARACT EXTRACTION PHACO AND INTRAOCULAR LENS PLACEMENT (IOC);  Surgeon: Harrie Agent, MD;  Location: [facility];  Service: Ophthalmology;  Laterality: Left;  CDE 6.92   COLONOSCOPY WITH PROPOFOL  N/A 06/08/2017   Procedure: COLONOSCOPY WITH PROPOFOL  ( Ultra-Slim Scope);  Surgeon: Avram Lupita BRAVO, MD;  Location: [facility];  Service: Endoscopy;  Laterality: N/A;   ENDARTERECTOMY Right 02/09/2024   Procedure: RIGHT CAROTID ENDARTERECTOMY WITH PATCH ANGIOPLASTY USING XENOSURE BIOLOGIC PATCH 1 CM x 14 CM;  Surgeon: Sheree Penne Bruckner, MD;  Location: [facility] OR;  Service: Vascular;  Laterality: Right;   ENDARTERECTOMY FEMORAL Bilateral 05/30/2018   Procedure: ENDARTERECTOMY FEMORAL LEFT;  Surgeon: Laurence Redell CROME, MD;  Location: [facility] OR;  Service: Vascular;  Laterality: Bilateral;   INTRAOPERATIVE ARTERIOGRAM Bilateral 05/30/2018   Procedure: INTRA OPERATIVE ARTERIOGRAM BILATERAL LOWER EXTREMITIES;  Surgeon: Laurence Redell CROME, MD;  Location: [facility] OR;  Service: Vascular;  Laterality: Bilateral;   LOWER EXTREMITY ANGIOGRAPHY N/A 04/13/2018   Procedure: LOWER EXTREMITY ANGIOGRAPHY;  Surgeon: Laurence Redell CROME, MD;  Location[facility] INVASIVE CV LAB;  Service: Cardiovascular;  Laterality: N/A;  bilateral    PATCH ANGIOPLASTY Right 02/09/2024   Procedure: ANGIOPLASTY, USING PATCH GRAFT;  Surgeon: Sheree Penne Bruckner, MD;  Location: [facility]  OR;  Service: Vascular;  Laterality: Right;   UMBILICAL HERNIA REPAIR      FAMILY HISTORY: Family History  Problem Relation Age of Onset   CVA Mother    Alzheimer's disease Mother    Heart attack Father    Heart disease Father    Diabetes Brother    Rectal cancer Brother    Stomach cancer Neg Hx    Colon cancer Neg Hx     SOCIAL HISTORY: Social History   Socioeconomic History   Marital status: Married    Spouse name: [name]   Number of children: 2   Years of education: hs gr   Highest education level: Not on file  Occupational History   Occupation: retired    Comment: Adjuster  Tobacco Use   Smoking status: Former    Current packs/day: 0.00    Average packs/day: 1 pack/day for 15.0 years (15.0 ttl pk-yrs)    Types: Cigarettes    Start date: 06/06/1981    Quit date: 06/06/1996    Years since quitting: 27.9   Smokeless tobacco: Never   Tobacco comments:    Quit 20 years ago  Vaping Use   Vaping status: Never Used  Substance and Sexual Activity   Alcohol use: No    Alcohol/week: 0.0 standard drinks of alcohol   Drug use: No   Sexual activity: Not Currently    Partners: Female    Birth control/protection: None  Other Topics Concern   Not on file  Social History Narrative   Patient is married with 2 children.   Patient is right handed.   Patient has hs  education.   Patient drinks tea on sundays, sodas occ.   Social Drivers of Corporate investment banker Strain: Not on file  Food Insecurity: No Food Insecurity (02/08/2024)   Hunger Vital Sign    Worried About Running Out of Food in the Last Year: Never true    Ran Out of Food in the Last Year: Never true  Transportation Needs: No Transportation Needs (02/08/2024)   PRAPARE - Administrator, Civil Service (Medical): No    Lack of Transportation (Non-Medical): No  Physical Activity: Not on file  Stress: Not on file  Social Connections: Socially Integrated (02/08/2024)   Social Connection and Isolation Panel    Frequency of Communication with Friends and Family: More than three times a week    Frequency of Social Gatherings with Friends and Family: More than three times a week    Attends Religious Services: 1 to 4 times per year    Active Member of [employer] or Organizations: No    Attends Banker Meetings: 1 to 4 times per year    Marital Status: Married  Catering manager Violence: Not At Risk (02/08/2024)   Humiliation, Afraid, Rape, and Kick questionnaire    Fear of Current or Ex-Partner: No    Emotionally Abused: No    Physically Abused: No    Sexually Abused: No    PHYSICAL EXAM  GENERAL EXAM/CONSTITUTIONAL: Vitals:  Vitals:   04/30/24 1416 04/30/24 1422  BP: (!) 171/69 (!) 163/74  Pulse:  81  Resp:  15  SpO2:  93%  Weight:  245 lb (111.1 kg)  Height:  5' 10 (1.778 m)   Body mass index is 35.15 kg/m. Wt Readings from Last 3 Encounters:  04/30/24 245 lb (111.1 kg)  04/20/24 245 lb 3.2 oz (111.2 kg)  03/07/24 245 lb 9.6 oz (111.4 kg)  Patient is in no distress; well developed, nourished and groomed; neck is supple  MUSCULOSKELETAL: Gait, strength, tone, movements noted in Neurologic exam below  NEUROLOGIC: MENTAL STATUS:      No data to display         awake, alert, oriented to person, place and time recent and remote memory  intact normal attention and concentration language fluent, comprehension intact, naming intact. There is very mild slurring but no aphasia  fund of knowledge appropriate  CRANIAL NERVE:  2nd, 3rd, 4th, 6th - Visual fields full to confrontation, extraocular muscles intact, no nystagmus 5th - facial sensation symmetric 7th - facial strength symmetric 8th - hearing intact 9th - palate elevates symmetrically, uvula midline 11th - shoulder shrug symmetric 12th - tongue protrusion midline  MOTOR:  normal bulk and tone. Very mild LUE weakness due to pronation of the left arm but no drift and satelliting around the left arm. On confrontation, he has full strength in the BUE, BLE  SENSORY:  normal and symmetric to light touch  COORDINATION:  finger-nose-finger, fine finger movements normal  GAIT/STATION:  Slight hemiplegic gait, dragging his left leg    DIAGNOSTIC DATA (LABS, IMAGING, TESTING) - I reviewed patient records, labs, notes, testing and imaging myself where available.  Lab Results  Component Value Date   WBC 11.9 (H) 02/10/2024   HGB 10.9 (L) 02/10/2024   HCT 34.8 (L) 02/10/2024   MCV 80.6 02/10/2024   PLT 336 02/10/2024      Component Value Date/Time   NA 136 02/10/2024 0500   K 3.9 02/10/2024 0500   CL 103 02/10/2024 0500   CO2 23 02/10/2024 0500   GLUCOSE 206 (H) 02/10/2024 0500   BUN 18 02/10/2024 0500   CREATININE 1.08 02/10/2024 0500   CALCIUM  8.6 (L) 02/10/2024 0500   PROT 6.7 02/03/2024 1146   ALBUMIN 3.5 02/03/2024 1146   AST 14 (L) 02/03/2024 1146   ALT 11 02/03/2024 1146   ALKPHOS 65 02/03/2024 1146   BILITOT 0.7 02/03/2024 1146   GFRNONAA >60 02/10/2024 0500   GFRAA >60 05/31/2018 0443   Lab Results  Component Value Date   CHOL 91 02/10/2024   HDL 27 (L) 02/10/2024   LDLCALC 48 02/10/2024   TRIG 81 02/10/2024   CHOLHDL 3.4 02/10/2024   Lab Results  Component Value Date   HGBA1C 6.1 (H) 01/30/2024   No results found for:  VITAMINB12 No results found for: TSH  MRI Brain 01/29/2024  1. Numerous small acute right cerebral infarcts. 2. Mild-to-moderate chronic small vessel ischemic disease  CTA Head and Neck 01/29/2024 1. Approximately 80% stenosis of the right ICA at the C2 level. 2. Severe left and moderate right intradural vertebral artery stenosis. 3. Approximately 50% stenosis of the proximal left ICA. 4. Suspected bilateral vertebral artery origins/V1 segment stenosis, but evaluation is limited due to artifact.   ASSESSMENT AND PLAN  78 y.o. year old male with history including previous strokes, hypertension, hyperlipidemia, diabetes mellitus, PVD, untreated sleep apnea who is presenting after being admitted to hospital in April for confusion and found to have right hemispheric strokes and right carotid stenosis.  Stroke etiology likely large vessel disease has he was found to have 80% stenosis in the right ICA.  He status post right CEA as of April 10 and has been doing well.  He also completed a 30-days cardiac monitoring with no evidence of atrial fibrillation.  Plan for patient will be to continue current medications, to continue with PT,  OT and speech.  I spent extended amount of time discussing need for physical therapy and for patient to do the exercises at home.  He voiced understanding.  Continue to follow-up with your doctors and return as needed.   1. Acute CVA (cerebrovascular accident) (HCC)   2. Stenosis of right carotid artery      Patient Instructions  Continue current medications including aspirin  and Plavix  Continued physical, occupational and speech therapy, make sure you do the exercises at home on days that you do not have therapy Continue to follow with your doctors Return as needed.  No orders of the defined types were placed in this encounter.   No orders of the defined types were placed in this encounter.   Return if symptoms worsen or fail to improve.  I personally  spent a total of 50 minutes in the care of the patient today including preparing to see the patient, getting/reviewing separately obtained history, performing a medically appropriate exam/evaluation, counseling and educating, documenting clinical information in the EHR, and discussing importance of physical, occupational and speech therapies.   Pastor Falling, MD 04/30/2024, 3:43 PM  [facility] [address], [address] [place], [place] [address] [phone]

## 2024-04-30 NOTE — Patient Instructions (Signed)
 Continue current medications including aspirin  and Plavix  Continued physical, occupational and speech therapy, make sure you do the exercises at home on days that you do not have therapy Continue to follow with your doctors Return as needed.

## 2024-05-08 ENCOUNTER — Encounter: Payer: Self-pay | Admitting: Vascular Surgery

## 2024-07-20 NOTE — Progress Notes (Signed)
 Cardiology Office Note    Date:  08/03/2024  ID:  Jacob Avery, DOB [birth year], MRN [id number] Cardiologist: Maude Emmer, MD    History of Present Illness:    Jacob Avery is a 78 y.o. male with past medical history of presumed CAD (prior NST in 2019 showing evidence of prior infarct with mild peri-infarct ischemia and medical management recommended), HTN, HLD and PVD (prior femoral endarterectomy and left axillary bifemoral bypass in 2019) who presents to the office today for hospital follow-up.  He was last examined by Dr. Emmer in 04/2023 and reported having intermittent claudication but denied any specific anginal symptoms. He had previously been intolerant to Lopressor  given bradycardia and no changes were made to his medications at that time. He was continued on Amlodipine  10 mg daily, ASA 81 mg daily, Atorvastatin  80 mg daily, Plavix  75 mg daily, Zetia  10 mg daily, Losartan -HCTZ 100-25 mg daily and Hydralazine  25 mg 3 times daily.  In the interim, he was hospitalized at [facility] in 12/2023 for an acute CVA and he was out of the therapeutic window for tPA. Imaging showed multiple acute infarcts which was suspicious for embolic etiology. CTA Neck did show 80% stenosis of the RICA. Echocardiogram at that time showed a hyperdynamic LVEF of 70 to 75% with grade 1 diastolic dysfunction and normal RV function. Bubble study was negative. 30-day monitor was recommended at discharge as well. This showed predominantly normal sinus rhythm and PVC's with a less than 1% burden and frequent PAC's with a 14% burden. Average heart rate was 43 bpm but there was no evidence of high-grade AV block.  He did meet with Vascular Surgery as an outpatient and ultimately underwent right carotid endarterectomy on 02/09/2024. No immediate complications were noted. He was switched from Brilinta  to Plavix  and PPI therapy was also transitioned to Pantoprazole .  In talking with patient, his wife and his grandson today he  reports overall doing well given his multiple medical issues over the past few months. He does have residual left-sided weakness but is working with Home Health PT and OT. Says that they have noted that his heart rate has been low at times. He has been taking Lopressor  25 mg twice daily as this was restarted during his admission for unclear reasons.  He denies any chest pain or palpitations. Does have dyspnea on exertion which has been occurring for several months. No specific orthopnea, PND or pitting edema. 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 Left ventricular ejection fraction, by estimation, is 70 to 75%. The  left ventricle has hyperdynamic function. The left ventricle has no  regional wall motion abnormalities. Left ventricular diastolic parameters  are consistent with Grade I diastolic  dysfunction (impaired relaxation).   2. Right ventricular systolic function is normal. The right ventricular  size is moderately enlarged. There is normal pulmonary artery systolic  pressure.   3. Left atrial size was mildly dilated.   4. The mitral valve is normal in structure. No evidence of mitral valve  regurgitation.  No evidence of mitral stenosis.   5. The aortic valve is tricuspid. There is mild calcification of the  aortic valve. Aortic valve regurgitation is not visualized. No aortic  stenosis is present.   6. The inferior vena cava is normal in size with greater than 50%  respiratory variability, suggesting right atrial pressure of 3 mmHg.   7. Agitated saline contrast bubble study was negative, with no evidence  of any interatrial shunt at  rest and inconclusive with Valsalva due to  suboptimal; image.   8. Increased flow velocities may be secondary to anemia, thyrotoxicosis,  hyperdynamic or high flow state.   Carotid Dopplers: 01/2024 Summary:  Right Carotid: Velocities in the right ICA are consistent with a 1-39%  stenosis.                Non-hemodynamically significant plaque <50% noted in the  CCA. The                 ECA appears occluded.   Left Carotid: Velocities in the left ICA (mid) are consistent with a  40-59%               stenosis. Non-hemodynamically significant plaque <50% noted  in the                CCA. The ECA appears <50% stenosed.   Vertebrals:  Bilateral vertebral arteries demonstrate antegrade flow.  Subclavians: Normal flow hemodynamics were seen in bilateral subclavian               arteries.   Event Monitor: 03/2024 NSR  Average HR 43 bpm with range 31-116 bpm PVC < 1% total beats PAC 14% total beats  No high grade AV block  Physical Exam:   VS:  BP (!) 146/62   Pulse (!) 57   Ht 5' 9 (1.753 m)   Wt 246 lb 9.6 oz (111.9 kg)   SpO2 96%   BMI 36.42 kg/m    Wt Readings from Last 3 Encounters:  08/03/24 246 lb 9.6 oz (111.9 kg)  04/30/24 245 lb (111.1 kg)  04/20/24 245 lb 3.2 oz (111.2 kg)    Affect appropriate Overweight black male  HEENT: normal Neck Prior right CEA  JVP normal no bruits no thyromegaly Lungs clear with no wheezing and good diaphragmatic motion Heart:  S1/S2 no murmur, no rub, gallop or click PMI normal Abdomen: benighn, BS positve, no tenderness, no AAA no bruit.  No HSM or HJR Distal pulses intact with no bruits No edema Some LUE/LLE weakness and hand grip weakness on left  Skin warm and dry No muscular weakness    Assessment and Plan:   1. Bradycardia - He has a history of baseline bradycardia and for unclear reasons was restarted on Lopressor  during his admission despite this being stopped at prior office visits. Recent monitor showed his  average heart rate was in the 40's but thankfully no evidence of high-grade AV block. Suspect that his dyspnea on exertion might be due to this.  Lopressor  stopped again by PA 04/20/24  HR 57 and regular today   2. Coronary artery disease involving native coronary artery of native heart without angina pectoris - He has a history of presumed CAD based off prior NST in 04/2018 showing evidence of prior inferior septal infarct with mild peri-infarct ischemia but was overall a low-risk study. Did not undergo cardiac catheterization given no recent symptoms and medical management pursued.  - Recent echocardiogram in 12/2023 showed a  hyperdynamic LVEF and no regional wall motion abnormalities. He does report dyspnea as described above and suspect this is likely multifactorial in the setting of deconditioning and possibly due to his bradycardia.  - Continue ASA 81 mg daily, Atorvastatin  80 mg daily and Zetia  10 mg daily. He is also on Plavix  per Vascular Surgery.  3. Essential hypertension -  Continue current medical therapy with Amlodipine  10 mg daily, Hydralazine  25 mg 3 times daily and Losartan -HCTZ 100-25 mg daily. Do not use Beta blockers  4. Hyperlipidemia LDL goal <70 - FLP in 01/2024 showed total cholesterol 91, triglycerides 81, HDL 27 and LDL 48. Continue current medical therapy with Atorvastatin  80 mg daily and Zetia  10 mg daily.  5. Stenosis of carotid artery, unspecified laterality - Followed by Vascular Surgery. He recently underwent right carotid endarterectomy on 02/09/2024 after having a CVA. Remains on ASA 81 mg daily, Plavix  75 mg daily and Atorvastatin  80 mg daily.   F/U in a year    Signed, Maude Emmer, MD

## 2024-07-27 ENCOUNTER — Ambulatory Visit: Admitting: Podiatry

## 2024-07-27 ENCOUNTER — Encounter: Payer: Self-pay | Admitting: Podiatry

## 2024-07-27 DIAGNOSIS — M79674 Pain in right toe(s): Secondary | ICD-10-CM

## 2024-07-27 DIAGNOSIS — B351 Tinea unguium: Secondary | ICD-10-CM

## 2024-07-27 DIAGNOSIS — M79675 Pain in left toe(s): Secondary | ICD-10-CM

## 2024-07-27 DIAGNOSIS — E1151 Type 2 diabetes mellitus with diabetic peripheral angiopathy without gangrene: Secondary | ICD-10-CM

## 2024-07-27 NOTE — Progress Notes (Signed)
This patient returns to my office for at risk foot care.  This patient requires this care by a professional since this patient will be at risk due to having diabetes and CKD.  This patient is unable to cut nails himself since the patient cannot reach his nails.These nails are painful walking and wearing shoes.  This patient presents for at risk foot care today.  General Appearance  Alert, conversant and in no acute stress.  Vascular  Dorsalis pedis and posterior tibial  pulses are  weakly palpable  bilaterally.  Capillary return is within normal limits  bilaterally. Temperature is within normal limits  bilaterally.  Neurologic  Senn-Weinstein monofilament wire test within normal limits  bilaterally. Muscle power within normal limits bilaterally.  Nails Thick disfigured discolored nails with subungual debris  from hallux to fifth toes bilaterally. No evidence of bacterial infection or drainage bilaterally.  Orthopedic  No limitations of motion  feet .  No crepitus or effusions noted.  No bony pathology or digital deformities noted.  Midfoot  DJD.  Skin  normotropic skin with no porokeratosis noted bilaterally.  No signs of infections or ulcers noted.     Onychomycosis  Pain in right toes  Pain in left toes  Consent was obtained for treatment procedures.   Mechanical debridement of nails 1-5  bilaterally performed with a nail nipper.  Filed with dremel without incident.    Return office visit      3 months                Told patient to return for periodic foot care and evaluation due to potential at risk complications.   Helane Gunther DPM

## 2024-08-03 ENCOUNTER — Encounter: Payer: Self-pay | Admitting: Cardiovascular Disease

## 2024-08-03 ENCOUNTER — Ambulatory Visit: Attending: Cardiovascular Disease | Admitting: Cardiovascular Disease

## 2024-08-03 VITALS — BP 146/62 | HR 57 | Ht 69.0 in | Wt 246.6 lb

## 2024-08-03 DIAGNOSIS — E785 Hyperlipidemia, unspecified: Secondary | ICD-10-CM | POA: Diagnosis not present

## 2024-08-03 DIAGNOSIS — I10 Essential (primary) hypertension: Secondary | ICD-10-CM

## 2024-08-03 DIAGNOSIS — I639 Cerebral infarction, unspecified: Secondary | ICD-10-CM

## 2024-08-03 DIAGNOSIS — R001 Bradycardia, unspecified: Secondary | ICD-10-CM

## 2024-08-03 NOTE — Patient Instructions (Signed)
 Medication Instructions:  Your physician recommends that you continue on your current medications as directed. Please refer to the Current Medication list given to you today.   Labwork: None today  Testing/Procedures: None today  Follow-Up: 1 year  Any Other Special Instructions Will Be Listed Below (If Applicable).  If you need a refill on your cardiac medications before your next appointment, please call your pharmacy.

## 2024-10-19 ENCOUNTER — Ambulatory Visit: Admitting: Podiatry

## 2024-10-19 ENCOUNTER — Encounter: Payer: Self-pay | Admitting: Podiatry

## 2024-10-19 DIAGNOSIS — B351 Tinea unguium: Secondary | ICD-10-CM

## 2024-10-19 DIAGNOSIS — M79674 Pain in right toe(s): Secondary | ICD-10-CM | POA: Diagnosis not present

## 2024-10-19 DIAGNOSIS — M79675 Pain in left toe(s): Secondary | ICD-10-CM | POA: Diagnosis not present

## 2024-10-19 DIAGNOSIS — E1151 Type 2 diabetes mellitus with diabetic peripheral angiopathy without gangrene: Secondary | ICD-10-CM

## 2024-10-19 NOTE — Progress Notes (Signed)
This patient returns to my office for at risk foot care.  This patient requires this care by a professional since this patient will be at risk due to having diabetes and CKD.  This patient is unable to cut nails himself since the patient cannot reach his nails.These nails are painful walking and wearing shoes.  This patient presents for at risk foot care today.  General Appearance  Alert, conversant and in no acute stress.  Vascular  Dorsalis pedis and posterior tibial  pulses are  weakly palpable  bilaterally.  Capillary return is within normal limits  bilaterally. Temperature is within normal limits  bilaterally.  Neurologic  Senn-Weinstein monofilament wire test within normal limits  bilaterally. Muscle power within normal limits bilaterally.  Nails Thick disfigured discolored nails with subungual debris  from hallux to fifth toes bilaterally. No evidence of bacterial infection or drainage bilaterally.  Orthopedic  No limitations of motion  feet .  No crepitus or effusions noted.  No bony pathology or digital deformities noted.  Midfoot  DJD.  Skin  normotropic skin with no porokeratosis noted bilaterally.  No signs of infections or ulcers noted.     Onychomycosis  Pain in right toes  Pain in left toes  Consent was obtained for treatment procedures.   Mechanical debridement of nails 1-5  bilaterally performed with a nail nipper.  Filed with dremel without incident.    Return office visit      3 months                Told patient to return for periodic foot care and evaluation due to potential at risk complications.   Helane Gunther DPM

## 2025-01-17 ENCOUNTER — Ambulatory Visit: Admitting: Podiatry
